# Patient Record
Sex: Male | Born: 1960
Health system: Southern US, Community
[De-identification: ages and names within clinical notes are randomized; demographics above are authoritative.]

## PROBLEM LIST (undated history)

## (undated) DIAGNOSIS — E119 Type 2 diabetes mellitus without complications: Secondary | ICD-10-CM

## (undated) DIAGNOSIS — G4730 Sleep apnea, unspecified: Secondary | ICD-10-CM

## (undated) DIAGNOSIS — I10 Essential (primary) hypertension: Secondary | ICD-10-CM

## (undated) DIAGNOSIS — Z9889 Other specified postprocedural states: Secondary | ICD-10-CM

## (undated) DIAGNOSIS — Z87442 Personal history of urinary calculi: Secondary | ICD-10-CM

## (undated) DIAGNOSIS — R112 Nausea with vomiting, unspecified: Secondary | ICD-10-CM

## (undated) HISTORY — PX: CARPAL TUNNEL RELEASE: SHX101

## (undated) HISTORY — PX: EYE SURGERY: SHX253

## (undated) HISTORY — PX: HERNIA REPAIR: SHX51

## (undated) HISTORY — DX: Type 2 diabetes mellitus without complications: E11.9

## (undated) HISTORY — PX: TRICEPS TENDON REPAIR: SHX2577

## (undated) HISTORY — PX: BACK SURGERY: SHX140

## (undated) HISTORY — DX: Essential (primary) hypertension: I10

## (undated) HISTORY — PX: OTHER SURGICAL HISTORY: SHX169

---

## 2011-06-09 HISTORY — PX: JOINT REPLACEMENT: SHX530

## 2016-08-24 LAB — POCT SYNOVIAL FLUID - CRYSTALS

## 2016-08-25 DIAGNOSIS — I152 Hypertension secondary to endocrine disorders: Secondary | ICD-10-CM | POA: Insufficient documentation

## 2016-08-25 DIAGNOSIS — E1159 Type 2 diabetes mellitus with other circulatory complications: Secondary | ICD-10-CM | POA: Insufficient documentation

## 2016-08-25 DIAGNOSIS — I10 Essential (primary) hypertension: Secondary | ICD-10-CM

## 2017-02-20 ENCOUNTER — Telehealth: Payer: Self-pay | Admitting: Family Medicine

## 2017-02-20 NOTE — Telephone Encounter (Signed)
Per pt, he received refill from previous DR

## 2017-02-25 ENCOUNTER — Ambulatory Visit: Payer: Self-pay | Admitting: Family Medicine

## 2017-02-26 ENCOUNTER — Ambulatory Visit: Payer: Self-pay | Admitting: Family Medicine

## 2017-02-28 ENCOUNTER — Ambulatory Visit: Payer: Self-pay | Admitting: Family Medicine

## 2017-03-05 ENCOUNTER — Ambulatory Visit: Payer: Self-pay | Admitting: Family Medicine

## 2017-03-28 ENCOUNTER — Ambulatory Visit (INDEPENDENT_AMBULATORY_CARE_PROVIDER_SITE_OTHER): Payer: Medicare Other | Admitting: Family Medicine

## 2017-03-28 ENCOUNTER — Encounter: Payer: Self-pay | Admitting: Family Medicine

## 2017-03-28 VITALS — BP 161/94 | HR 86 | Temp 97.3°F | Ht 66.0 in | Wt 229.4 lb

## 2017-03-28 DIAGNOSIS — M25512 Pain in left shoulder: Secondary | ICD-10-CM

## 2017-03-28 DIAGNOSIS — M546 Pain in thoracic spine: Secondary | ICD-10-CM

## 2017-03-28 DIAGNOSIS — E1169 Type 2 diabetes mellitus with other specified complication: Secondary | ICD-10-CM | POA: Insufficient documentation

## 2017-03-28 DIAGNOSIS — E119 Type 2 diabetes mellitus without complications: Secondary | ICD-10-CM

## 2017-03-28 DIAGNOSIS — G8929 Other chronic pain: Secondary | ICD-10-CM | POA: Diagnosis not present

## 2017-03-28 DIAGNOSIS — E669 Obesity, unspecified: Secondary | ICD-10-CM | POA: Insufficient documentation

## 2017-03-28 LAB — BAYER DCA HB A1C WAIVED: HB A1C (BAYER DCA - WAIVED): 5.9 % (ref <7.0)

## 2017-03-28 NOTE — Progress Notes (Signed)
   HPI  Patient presents today here to establish care with diabetes, back pain, left shoulder pain.  Diabetes Patient watching his diet moderately, he does have some indiscretions. Good medication compliance.  Back pain Patient states that he was previously addicted oxycodone, he does not want to be treated with any pain medications. He is currently flying to [place] to continue receiving back injections that helped him. He wonders if there is an orthopedic doctor or physical medicine and rehabilitation doctor that do back injections locally.  Left shoulder pain Started after lifting a lawnmower, described as left shoulder pain across his left Clavicle and radiating down his left bicep. Does not limit his activity.   PMH: Diabetes, hypertension Surgical history: Clavicle surgery, back surgery, bilateral eye surgery for glaucoma, hernia repair, triceps tendon repair Family history: Father with diabetes, heart disease, hypertension, mother with cancer Social history: Never smoker, no alcohol or drug use States that he was previously addicted to opiates and does not want to take these. ROS: Per HPI  Objective: BP (!) 161/94   Pulse 86   Temp (!) 97.3 F (36.3 C) (Oral)   Ht '5\' 6"'$  (1.676 m)   Wt 229 lb 6.4 oz (104.1 kg)   BMI 37.03 kg/m  Gen: NAD, alert, cooperative with exam HEENT: NCAT CV: RRR, good S1/S2, no murmur Resp: CTABL, no wheezes, non-labored Ext: No edema, warm Neuro: Alert and oriented, No gross deficits Left shoulder No tenderness to palpation of the bony landmarks of the shoulder Full range of motion, negative empty can and Hawkins sign  Assessment and plan:  # Type 2 diabetes A1c pending, continue metformin Appears to be well controlled  # Chronic thoracic back pain Patient receiving injections from physical medicine and rehabilitation in [place], refer to orthopedics for their opinion We do have PM&R but it is difficult to get appts with  and I believe he will be well taken care of woth ortho  # L shoulder pain. Unclear etiology, his provocative testing for rotator cuff syndrome is reassuring Mild to moderate symptoms Refer to orthopedics    Orders Placed This Encounter  Procedures  . CMP14+EGFR  . CBC with Differential/Platelet  . Lipid panel  . Bayer DCA Hb A1c Waived  . Ambulatory referral to Orthopedic Surgery    Referral Priority:   Routine    Referral Type:   Surgical    Referral Reason:   Specialty Services Required    Requested Specialty:   Orthopedic Surgery    Number of Visits Requested:   1    Meds ordered this encounter  Medications  . ibuprofen (ADVIL,MOTRIN) 800 MG tablet    Sig: Take one tablet with meals every 8 hours as needed for pain  . lisinopril (PRINIVIL,ZESTRIL) 10 MG tablet    Sig: Take 10 mg by mouth.  . metFORMIN (GLUCOPHAGE) 500 MG tablet    Sig: Take 2 tablets by mouth 2 (two) times daily.  . diclofenac sodium (VOLTAREN) 1 % GEL    Sig: Apply topically.  . naproxen sodium (ANAPROX) 550 MG tablet    Sig: Take 550 mg by mouth.  . Omega 3 1000 MG CAPS    Sig: Take 1 tablet by mouth daily.    Laroy Apple, MD [facility] 03/28/2017, 1:52 PM

## 2017-03-28 NOTE — Patient Instructions (Signed)
Great to meet you!  Come back in 3 month sunless you need [place] sooner.   We will make the calls for your orthopedics appointment

## 2017-03-29 LAB — CBC WITH DIFFERENTIAL/PLATELET
BASOS: 0 %
Basophils Absolute: 0 10*3/uL (ref 0.0–0.2)
EOS (ABSOLUTE): 0.2 10*3/uL (ref 0.0–0.4)
EOS: 3 %
HEMATOCRIT: 36.6 % — AB (ref 37.5–51.0)
HEMOGLOBIN: 12.6 g/dL — AB (ref 13.0–17.7)
IMMATURE GRANS (ABS): 0 10*3/uL (ref 0.0–0.1)
Immature Granulocytes: 0 %
LYMPHS ABS: 2.9 10*3/uL (ref 0.7–3.1)
LYMPHS: 32 %
MCH: 28.5 pg (ref 26.6–33.0)
MCHC: 34.4 g/dL (ref 31.5–35.7)
MCV: 83 fL (ref 79–97)
MONOCYTES: 8 %
Monocytes Absolute: 0.7 10*3/uL (ref 0.1–0.9)
NEUTROS ABS: 5 10*3/uL (ref 1.4–7.0)
Neutrophils: 57 %
Platelets: 230 10*3/uL (ref 150–379)
RBC: 4.42 x10E6/uL (ref 4.14–5.80)
RDW: 14.4 % (ref 12.3–15.4)
WBC: 8.8 10*3/uL (ref 3.4–10.8)

## 2017-03-29 LAB — CMP14+EGFR
ALK PHOS: 75 IU/L (ref 39–117)
ALT: 17 IU/L (ref 0–44)
AST: 17 IU/L (ref 0–40)
Albumin/Globulin Ratio: 2.1 (ref 1.2–2.2)
Albumin: 4.2 g/dL (ref 3.5–5.5)
BUN / CREAT RATIO: 16 (ref 9–20)
BUN: 14 mg/dL (ref 6–24)
Bilirubin Total: 0.3 mg/dL (ref 0.0–1.2)
CALCIUM: 9.6 mg/dL (ref 8.7–10.2)
CO2: 25 mmol/L (ref 20–29)
CREATININE: 0.87 mg/dL (ref 0.76–1.27)
Chloride: 105 mmol/L (ref 96–106)
GFR, EST AFRICAN AMERICAN: 112 mL/min/{1.73_m2} (ref >59)
GFR, EST NON AFRICAN AMERICAN: 96 mL/min/{1.73_m2} (ref >59)
GLOBULIN, TOTAL: 2 g/dL (ref 1.5–4.5)
GLUCOSE: 98 mg/dL (ref 65–99)
Potassium: 4.5 mmol/L (ref 3.5–5.2)
Sodium: 143 mmol/L (ref 134–144)
Total Protein: 6.2 g/dL (ref 6.0–8.5)

## 2017-03-29 LAB — LIPID PANEL
CHOL/HDL RATIO: 3.8 ratio (ref 0.0–5.0)
Cholesterol, Total: 166 mg/dL (ref 100–199)
HDL: 44 mg/dL (ref >39)
LDL Calculated: 94 mg/dL (ref 0–99)
Triglycerides: 140 mg/dL (ref 0–149)
VLDL CHOLESTEROL CAL: 28 mg/dL (ref 5–40)

## 2017-05-01 ENCOUNTER — Other Ambulatory Visit: Payer: Self-pay | Admitting: Family Medicine

## 2017-05-13 ENCOUNTER — Encounter: Payer: Self-pay | Admitting: Family Medicine

## 2017-05-13 ENCOUNTER — Ambulatory Visit (INDEPENDENT_AMBULATORY_CARE_PROVIDER_SITE_OTHER): Payer: Medicare Other | Admitting: Family Medicine

## 2017-05-13 VITALS — BP 164/91 | HR 75 | Temp 98.1°F | Ht 66.0 in | Wt 229.0 lb

## 2017-05-13 DIAGNOSIS — M5412 Radiculopathy, cervical region: Secondary | ICD-10-CM

## 2017-05-13 MED ORDER — BETAMETHASONE SOD PHOS & ACET 6 (3-3) MG/ML IJ SUSP
6.0000 mg | Freq: Once | INTRAMUSCULAR | Status: AC
  Administered 2017-05-13: 6 mg via INTRAMUSCULAR

## 2017-05-13 NOTE — Progress Notes (Signed)
Chief Complaint  Patient presents with  . Arm Pain    pt here today c/o right arm pain after getting flu shot last Tuesday and he was seen at Urgent Care Friday and was told it wasn't infected but gave him a Toradol shot and 800 mg Ibuprofen.    HPI  Patient presents today for right shoulder pain radiating from the deltoid of the superior margin of the trapezius and down toward the angle of the scapula on the right.  Onset was the day after he was given his flu shot.  He is concerned about infection because of multiple orthopedic procedures with hardware present.  He has had a staph infection in the past.  It was complicated by his hardware at the time.  PMH: Smoking status noted ROS: Per HPI  Objective: BP (!) 164/91   Pulse 75   Temp 98.1 F (36.7 C) (Oral)   Ht 5\' 6"  (1.676 m)   Wt 229 lb (103.9 kg)   BMI 36.96 kg/m  Gen: NAD, alert, cooperative with exam HEENT: NCAT, EOMI, PERRL CV: RRR, good S1/S2, no murmur Resp: CTABL, no wheezes, non-labored Ext: No edema, warm but not hot.  Full range of motion at the right upper extremity.  Mild tenderness over the belly of the deltoid muscle on the right.  No sign of erythema edema heat or purulence Neuro: Alert and oriented, No gross deficits  Assessment and plan:  1. Cervical neuralgia     Meds ordered this encounter  Medications  . betamethasone acetate-betamethasone sodium phosphate (CELESTONE) injection 6 mg   Sling dispensed.  He should wear when ambulatory for the next 5-7 days or until pain resolves.   Follow up as needed.  Mechele ClaudeWarren Cassadie Pankonin, MD

## 2017-05-17 ENCOUNTER — Telehealth: Payer: Self-pay | Admitting: Family Medicine

## 2017-05-17 ENCOUNTER — Other Ambulatory Visit: Payer: Self-pay | Admitting: Family Medicine

## 2017-05-17 MED ORDER — PREDNISONE 10 MG PO TABS: ORAL_TABLET | ORAL | 0 refills | Status: DC

## 2017-05-17 NOTE — Telephone Encounter (Signed)
Pt aware.

## 2017-05-17 NOTE — Telephone Encounter (Signed)
Pt would like steroid for shoulder pain - he seen stacks about this 11/5.

## 2017-05-17 NOTE — Telephone Encounter (Signed)
I sent in the requested prescription 

## 2017-06-21 ENCOUNTER — Telehealth: Payer: Self-pay | Admitting: Family Medicine

## 2017-06-21 NOTE — Telephone Encounter (Signed)
Attempted to contact patient - NVM 

## 2017-06-21 NOTE — Telephone Encounter (Signed)
Patient arm still hurting from the Flu Shot he got at [employer]. It did not bothering him while he was on Prednisone. He is [facility] [place]. Please advise.

## 2017-06-21 NOTE — Telephone Encounter (Signed)
Pt calling back and very up set Pt is waiting at pharmacy.

## 2017-06-27 ENCOUNTER — Telehealth: Payer: Self-pay | Admitting: Family Medicine

## 2017-06-27 ENCOUNTER — Ambulatory Visit (INDEPENDENT_AMBULATORY_CARE_PROVIDER_SITE_OTHER): Payer: Medicare Other | Admitting: Family Medicine

## 2017-06-27 ENCOUNTER — Encounter: Payer: Self-pay | Admitting: Family Medicine

## 2017-06-27 VITALS — BP 149/91 | HR 68 | Temp 97.0°F | Ht 66.0 in | Wt 227.8 lb

## 2017-06-27 DIAGNOSIS — R040 Epistaxis: Secondary | ICD-10-CM | POA: Diagnosis not present

## 2017-06-27 DIAGNOSIS — G629 Polyneuropathy, unspecified: Secondary | ICD-10-CM

## 2017-06-27 DIAGNOSIS — E119 Type 2 diabetes mellitus without complications: Secondary | ICD-10-CM

## 2017-06-27 LAB — CMP14+EGFR
ALBUMIN: 4.3 g/dL (ref 3.5–5.5)
ALT: 20 IU/L (ref 0–44)
AST: 17 IU/L (ref 0–40)
Albumin/Globulin Ratio: 2.4 — ABNORMAL HIGH (ref 1.2–2.2)
Alkaline Phosphatase: 70 IU/L (ref 39–117)
BUN / CREAT RATIO: 20 (ref 9–20)
BUN: 16 mg/dL (ref 6–24)
Bilirubin Total: 0.4 mg/dL (ref 0.0–1.2)
CALCIUM: 9.1 mg/dL (ref 8.7–10.2)
CO2: 23 mmol/L (ref 20–29)
CREATININE: 0.82 mg/dL (ref 0.76–1.27)
Chloride: 106 mmol/L (ref 96–106)
GFR, EST AFRICAN AMERICAN: 114 mL/min/{1.73_m2} (ref >59)
GFR, EST NON AFRICAN AMERICAN: 99 mL/min/{1.73_m2} (ref >59)
GLOBULIN, TOTAL: 1.8 g/dL (ref 1.5–4.5)
Glucose: 93 mg/dL (ref 65–99)
Potassium: 4.4 mmol/L (ref 3.5–5.2)
SODIUM: 143 mmol/L (ref 134–144)
TOTAL PROTEIN: 6.1 g/dL (ref 6.0–8.5)

## 2017-06-27 LAB — BAYER DCA HB A1C WAIVED: HB A1C (BAYER DCA - WAIVED): 5.9 % (ref <7.0)

## 2017-06-27 MED ORDER — METFORMIN HCL 500 MG PO TABS: ORAL_TABLET | ORAL | 1 refills | Status: DC

## 2017-06-27 MED ORDER — METHYLPREDNISOLONE ACETATE 80 MG/ML IJ SUSP
80.0000 mg | Freq: Once | INTRAMUSCULAR | Status: AC
  Administered 2017-06-27: 80 mg via INTRAMUSCULAR

## 2017-06-27 MED ORDER — PREGABALIN 75 MG PO CAPS: 75.0000 mg | ORAL_CAPSULE | Freq: Two times a day (BID) | ORAL | 2 refills | Status: DC

## 2017-06-27 MED ORDER — LISINOPRIL 10 MG PO TABS: 10.0000 mg | ORAL_TABLET | Freq: Every day | ORAL | 3 refills | Status: DC

## 2017-06-27 NOTE — Progress Notes (Signed)
   HPI  Patient presents today with right shoulder pain, nosebleeds, and diabetes.  Diabetes Watching high sugar foods, good metformin compliance  Right shoulder pain Started after a flu injection at [facility] that seemed to be too far posterior to the patient.  It is now hurting from the area of the injection to his right sided cervical area. No shoulder weakness or injury. No history of shoulder surgery on the right shoulder.  Nosebleeds Is been going on for 2 or 3 weeks, patient admits to using Afrin daily for 6 or more months.   PMH: Smoking status noted ROS: Per HPI  Objective: BP (!) 149/91   Pulse 68   Temp (!) 97 F (36.1 C) (Oral)   Ht '5\' 6"'$  (1.676 m)   Wt 227 lb 12.8 oz (103.3 kg)   BMI 36.77 kg/m  Gen: NAD, alert, cooperative with exam HEENT: NCAT, fresh blood in the left nares but no active bleeding, no visible vessel, swollen and boggy mucosa BL CV: RRR, good S1/S2, no murmur Resp: CTABL, no wheezes, non-labored Ext: No edema, warm Neuro: Alert and oriented, No gross deficits   Assessment and plan:  #Frequent nosebleeds Possibly Afrin induced, recommended stopping Afrin and less for severe nosebleed. Start nasal saline gel 2-3 times daily Transition to Flonase for chronic rhinitis  #Neuropathy Right shoulder pain most consistent with neuropathy, he does have a few signs of rotator cuff tendinitis on exam, however this started after a flu injection and has a very specific pattern of radiation concerning for neuropathy. Trial of Lyrica Refer to neurology He requests treatment today so he was given IM Depo-Medrol.  I did discuss the deleterious effects of this on his blood sugar control and cautioned against future use for this episode.  If it has not made a significant impact by this time it is unlikely to do so.  #Type 2 diabetes A1c pending, previously well controlled. Patient has had steroid exposure x2 since last visit Continue metformin Continue  lifestyle modification    Orders Placed This Encounter  Procedures  . Bayer DCA Hb A1c Waived  . CMP14+EGFR  . Ambulatory referral to Neurology    Referral Priority:   Routine    Referral Type:   Consultation    Referral Reason:   Specialty Services Required    Requested Specialty:   Neurology    Number of Visits Requested:   1    Meds ordered this encounter  Medications  . lisinopril (PRINIVIL,ZESTRIL) 10 MG tablet    Sig: Take 1 tablet (10 mg total) by mouth daily.    Dispense:  90 tablet    Refill:  3  . metFORMIN (GLUCOPHAGE) 500 MG tablet    Sig: TAKE 2 TABLETS 2 TIMES A DAY    Dispense:  180 tablet    Refill:  1  . pregabalin (LYRICA) 75 MG capsule    Sig: Take 1 capsule (75 mg total) by mouth 2 (two) times daily.    Dispense:  60 capsule    Refill:  [place], MD [place] 06/27/2017, 8:43 AM

## 2017-06-27 NOTE — Telephone Encounter (Signed)
Patient says saline gel causing his nose to bleed when using.  Please advise.

## 2017-06-27 NOTE — Telephone Encounter (Signed)
Patient aware and verbalizes understanding. 

## 2017-06-27 NOTE — Telephone Encounter (Signed)
Patient purchased saline gel to use in his nose and will use a qtip to apply.

## 2017-06-27 NOTE — Patient Instructions (Signed)
Great to see you  For your shoulder Try Lyrica 1 pill twice daily I have also referred you to neurology.  For nosebleeds Try to stop Afrin, after about 1 week you may try Flonase 2 sprays twice daily, this is over-the-counter Also I recommended trying nasal saline gel 2-3 times daily to help soothe the nasal mucosa as you heal.

## 2017-06-27 NOTE — Addendum Note (Signed)
Addended by: Angela AdamOSTOSKY, Arien Benincasa C on: 06/27/2017 09:12 AM   Modules accepted: Orders

## 2017-06-27 NOTE — Telephone Encounter (Signed)
Recommend using a finger to apply, continue using. He wasbleeding prior to use, likely it is only moving around what was there.   Murtis SinkSam Abdimalik Mayorquin, MD [facility] 06/27/2017, 5:05 PM

## 2017-06-28 ENCOUNTER — Encounter: Payer: Self-pay | Admitting: Family Medicine

## 2017-07-04 NOTE — Telephone Encounter (Signed)
Refer to phone note 06/27/17.

## 2017-08-19 DIAGNOSIS — F1920 Other psychoactive substance dependence, uncomplicated: Secondary | ICD-10-CM | POA: Insufficient documentation

## 2017-08-20 DIAGNOSIS — R10A1 Flank pain, right side: Secondary | ICD-10-CM | POA: Insufficient documentation

## 2017-08-20 DIAGNOSIS — R109 Unspecified abdominal pain: Secondary | ICD-10-CM | POA: Insufficient documentation

## 2017-09-02 ENCOUNTER — Telehealth: Payer: Self-pay | Admitting: *Deleted

## 2017-09-02 ENCOUNTER — Ambulatory Visit: Payer: Medicare Other | Admitting: Diagnostic Neuroimaging

## 2017-09-02 NOTE — Telephone Encounter (Signed)
Patient was no show for new patient appointment today. 

## 2017-09-03 ENCOUNTER — Encounter: Payer: Self-pay | Admitting: Diagnostic Neuroimaging

## 2017-09-18 DIAGNOSIS — N200 Calculus of kidney: Secondary | ICD-10-CM | POA: Insufficient documentation

## 2017-09-20 ENCOUNTER — Other Ambulatory Visit: Payer: Self-pay | Admitting: *Deleted

## 2017-09-20 MED ORDER — METFORMIN HCL 500 MG PO TABS: ORAL_TABLET | ORAL | 0 refills | Status: DC

## 2017-09-20 NOTE — Telephone Encounter (Signed)
Next Ov 09/25/17 

## 2017-09-25 ENCOUNTER — Telehealth: Payer: Self-pay | Admitting: Family Medicine

## 2017-09-25 ENCOUNTER — Ambulatory Visit: Payer: Medicare Other | Admitting: Family Medicine

## 2017-09-25 ENCOUNTER — Encounter: Payer: Self-pay | Admitting: Family Medicine

## 2017-09-25 VITALS — BP 159/95 | HR 63 | Temp 97.5°F | Ht 66.0 in | Wt 223.0 lb

## 2017-09-25 DIAGNOSIS — I83813 Varicose veins of bilateral lower extremities with pain: Secondary | ICD-10-CM

## 2017-09-25 DIAGNOSIS — E119 Type 2 diabetes mellitus without complications: Secondary | ICD-10-CM | POA: Diagnosis not present

## 2017-09-25 DIAGNOSIS — I10 Essential (primary) hypertension: Secondary | ICD-10-CM | POA: Diagnosis not present

## 2017-09-25 LAB — BAYER DCA HB A1C WAIVED: HB A1C: 5.6 % (ref <7.0)

## 2017-09-25 MED ORDER — LISINOPRIL 10 MG PO TABS: 10.0000 mg | ORAL_TABLET | Freq: Every day | ORAL | 3 refills | Status: DC

## 2017-09-25 NOTE — Progress Notes (Signed)
   HPI  Patient presents today to follow-up for diabetes and hypertension.  Hypertension Patient states he is taking 2 pills of what ever he has, his previous doctor told her to take 2 fives, I have prescribed 10 mg and he is not sure what he is actually taking. No headache or chest pain. Not checking at home.  Type 2 diabetes Good medication compliance with metformin Also watching diet.  Varicose veins Long-standing problem bilateral legs, patient has had some bleeding and pain with the superficial varicose veins of the right leg.   PMH: Smoking status noted ROS: Per HPI  Objective: BP (!) 159/95   Pulse 63   Temp (!) 97.5 F (36.4 C) (Oral)   Ht 5\' 6"  (1.676 m)   Wt 223 lb (101.2 kg)   BMI 35.99 kg/m  Gen: NAD, alert, cooperative with exam HEENT: NCAT CV: RRR, good S1/S2, no murmur Resp: CTABL, no wheezes, non-labored Abd: SNTND, BS present, no guarding or organomegaly Ext: No edema, dilated varicose veins bilateral legs right greater than left Neuro: Alert and oriented, No gross deficits  Assessment and plan:  #Type 2 diabetes Well controlled with A1c of 5.6 Continue metformin only Foot exam normal today  #Hypertension Uncontrolled Titrate lisinopril, we will do this after the patient calls back to clarify what dose he is taking  #Varicose veins Patient with varicose veins that are irritated and bleeding at times Referral to vascular  Orders Placed This Encounter  Procedures  . Microalbumin / creatinine urine ratio  . Bayer DCA Hb A1c Waived  . Ambulatory referral to Vascular Surgery    Referral Priority:   Routine    Referral Type:   Surgical    Referral Reason:   Specialty Services Required    Requested Specialty:   Vascular Surgery    Number of Visits Requested:   1     Murtis SinkSam Slaton Reaser, MD [facility] 09/25/2017, 8:44 AM

## 2017-09-25 NOTE — Telephone Encounter (Signed)
Patient aware and verbalizes understanding. 

## 2017-09-25 NOTE — Telephone Encounter (Signed)
Rolled HTN- recommend chnaging from 5 mg ( two 2.5 mg tabs) lisinopril to 10 mg.   Murtis SinkSam Bradshaw, MD [facility] 09/25/2017, 11:06 AM

## 2017-09-25 NOTE — Telephone Encounter (Signed)
Patient states he has been taking 2 5mg  of Lisinopril- Please advise if you are changing dose.

## 2017-09-25 NOTE — Patient Instructions (Signed)
Great to see you!  Keep up the god work, your diabetes is doing great!

## 2017-09-26 LAB — MICROALBUMIN / CREATININE URINE RATIO
CREATININE, UR: 67.7 mg/dL
MICROALB/CREAT RATIO: 5.8 mg/g{creat} (ref 0.0–30.0)
Microalbumin, Urine: 3.9 ug/mL

## 2017-10-08 ENCOUNTER — Other Ambulatory Visit: Payer: Self-pay | Admitting: *Deleted

## 2017-10-08 MED ORDER — METFORMIN HCL 500 MG PO TABS: ORAL_TABLET | ORAL | 0 refills | Status: DC

## 2017-11-25 ENCOUNTER — Ambulatory Visit: Payer: Medicare Other | Admitting: Family Medicine

## 2017-11-25 ENCOUNTER — Ambulatory Visit (INDEPENDENT_AMBULATORY_CARE_PROVIDER_SITE_OTHER): Payer: Medicare Other

## 2017-11-25 ENCOUNTER — Telehealth: Payer: Self-pay

## 2017-11-25 ENCOUNTER — Encounter: Payer: Self-pay | Admitting: Family Medicine

## 2017-11-25 VITALS — BP 130/80 | HR 71 | Temp 97.7°F | Ht 66.0 in | Wt 228.4 lb

## 2017-11-25 DIAGNOSIS — M5412 Radiculopathy, cervical region: Secondary | ICD-10-CM | POA: Diagnosis not present

## 2017-11-25 DIAGNOSIS — R52 Pain, unspecified: Secondary | ICD-10-CM | POA: Diagnosis not present

## 2017-11-25 DIAGNOSIS — T8089XD Other complications following infusion, transfusion and therapeutic injection, subsequent encounter: Secondary | ICD-10-CM

## 2017-11-25 MED ORDER — GABAPENTIN 600 MG PO TABS: 300.0000 mg | ORAL_TABLET | Freq: Three times a day (TID) | ORAL | 2 refills | Status: DC

## 2017-11-25 MED ORDER — NAPROXEN 500 MG PO TABS: 500.0000 mg | ORAL_TABLET | Freq: Two times a day (BID) | ORAL | 3 refills | Status: DC

## 2017-11-25 MED ORDER — DICLOFENAC SODIUM 1 % TD GEL: 2.0000 g | Freq: Four times a day (QID) | TRANSDERMAL | 3 refills | Status: DC

## 2017-11-25 NOTE — Progress Notes (Signed)
   HPI  Patient presents today with right shoulder pain.  Patient explains that the symptoms began in November after a flu shot.  Patient states that his flu shot seemed to be more posterior than usual and was given at a local grocery store.  Patient explains that he has had right posterior shoulder pain in the area that seems to be getting worse.  At times it is getting better but been getting worse. He does have some radiation down his arm with paresthesias and tingling.  He does have some worsening with movement of his neck. He denies any injury of his neck.   PMH: Smoking status noted ROS: Per HPI  Objective: BP 130/80   Pulse 71   Temp 97.7 F (36.5 C) (Oral)   Ht  (1.676 m)   Wt 228 lb 6.4 oz (103.6 kg)   BMI 36.86 kg/m  Gen: NAD, alert, cooperative with exam HEENT: NCAT CV: RRR, good S1/S2, no murmur Resp: CTABL, no wheezes, non-labored Ext: No edema, warm Neuro: Alert and oriented, No gross deficits  Assessment and plan:  #Pain at injection site, cervical radiculopathy Patient with unusual story, he began with pain after an injection. He still has tenderness over that area today.  However he does have radiculopathy and radiation down his arm causes some concern for cervical nerve root involvement. Plain film today Start gabapentin MRI If MR is clear I would recommend neurology evaluation to consider EMG to see if they can map which nerve is affected after the injection. I do not believe that the injection is absolutely to cause for his pain, however it could be, he could be developing something similar to complex regional pain syndrome.  He could also be that he has radiculopathy that is not related except as it pertains to time.  Orders Placed This Encounter  Procedures  . DG Cervical Spine 2 or 3 views    Standing Status:   Future    Standing Expiration Date:   11/25/2018    Order Specific Question:   Reason for Exam (SYMPTOM  OR DIAGNOSIS REQUIRED)   Answer:   R sided cervical radiculopathy    Order Specific Question:   Preferred imaging location?    Answer:   External  . MR Cervical Spine Wo Contrast    Standing Status:   Future    Standing Expiration Date:   01/26/2019    Order Specific Question:   What is the patient's sedation requirement?    Answer:   No Sedation    Order Specific Question:   Does the patient have a pacemaker or implanted devices?    Answer:   No    Order Specific Question:   Preferred imaging location?    Answer:   [facility] (table limit-550lbs)    Order Specific Question:   Radiology Contrast Protocol - do NOT remove file path    Answer:   [email or web address]    Meds ordered this encounter  Medications  . gabapentin (NEURONTIN) 600 MG tablet    Sig: Take 0.5 tablets (300 mg total) by mouth 3 (three) times daily.    Dispense:  45 tablet    Refill:  2    Murtis Sink, MD [place] 11/25/2017, 10:22 AM

## 2017-11-25 NOTE — Patient Instructions (Signed)
Great to see you!  Start gabapentin 1/2 pill at night, you may increase to 1/2 pill up to 3 times daily if it does not make you sleepy.  After 2 to 3 weeks if your symptoms are not improving, you may increase to 1 whole pill at night and slowly work your way up to 1 whole pill 3 times daily.  This medication may make you sleepy.

## 2017-11-25 NOTE — Telephone Encounter (Signed)
Returning your call. °

## 2017-11-25 NOTE — Addendum Note (Signed)
Addended by: Elenora Gamma on: 11/25/2017 10:43 AM   Modules accepted: Orders

## 2017-11-26 NOTE — Telephone Encounter (Signed)
Waiting on prior auth

## 2017-11-27 NOTE — Telephone Encounter (Signed)
In process 

## 2017-12-05 ENCOUNTER — Other Ambulatory Visit: Payer: Self-pay

## 2017-12-05 DIAGNOSIS — M5412 Radiculopathy, cervical region: Secondary | ICD-10-CM

## 2017-12-05 NOTE — Progress Notes (Signed)
Patient aware that insurance denied his MRI for neck pain.  Dr. Ermalinda Memos is requesting patient be referred to Ortho- patient aware and referral placed.

## 2017-12-09 ENCOUNTER — Other Ambulatory Visit: Payer: Self-pay | Admitting: Family Medicine

## 2017-12-09 MED ORDER — GABAPENTIN 600 MG PO TABS: ORAL_TABLET | ORAL | 0 refills | Status: DC

## 2017-12-09 NOTE — Addendum Note (Signed)
Addended by: Julious PayerHOLT, Kendalynn Wideman D on: 12/09/2017 12:42 PM   Modules accepted: Orders

## 2017-12-10 ENCOUNTER — Telehealth: Payer: Self-pay | Admitting: Family Medicine

## 2017-12-10 NOTE — Telephone Encounter (Signed)
Patient aware that CD for copy of xray is getting made and patient can come pick up in an hour.

## 2017-12-17 DIAGNOSIS — M75101 Unspecified rotator cuff tear or rupture of right shoulder, not specified as traumatic: Secondary | ICD-10-CM | POA: Insufficient documentation

## 2017-12-26 ENCOUNTER — Ambulatory Visit: Payer: Medicare Other | Admitting: Family Medicine

## 2017-12-30 ENCOUNTER — Ambulatory Visit: Payer: Medicare Other | Admitting: Family Medicine

## 2017-12-30 DIAGNOSIS — M25511 Pain in right shoulder: Secondary | ICD-10-CM | POA: Insufficient documentation

## 2017-12-31 ENCOUNTER — Ambulatory Visit (INDEPENDENT_AMBULATORY_CARE_PROVIDER_SITE_OTHER): Payer: Medicare Other | Admitting: Family Medicine

## 2017-12-31 ENCOUNTER — Encounter: Payer: Self-pay | Admitting: Family Medicine

## 2017-12-31 VITALS — BP 163/99 | HR 71 | Temp 97.5°F | Ht 66.0 in | Wt 222.6 lb

## 2017-12-31 DIAGNOSIS — I10 Essential (primary) hypertension: Secondary | ICD-10-CM

## 2017-12-31 DIAGNOSIS — E119 Type 2 diabetes mellitus without complications: Secondary | ICD-10-CM

## 2017-12-31 LAB — CMP14+EGFR
ALK PHOS: 73 IU/L (ref 39–117)
ALT: 25 IU/L (ref 0–44)
AST: 21 IU/L (ref 0–40)
Albumin/Globulin Ratio: 2.8 — ABNORMAL HIGH (ref 1.2–2.2)
Albumin: 4.8 g/dL (ref 3.5–5.5)
BUN/Creatinine Ratio: 24 — ABNORMAL HIGH (ref 9–20)
BUN: 18 mg/dL (ref 6–24)
Bilirubin Total: 0.4 mg/dL (ref 0.0–1.2)
CALCIUM: 9.8 mg/dL (ref 8.7–10.2)
CO2: 21 mmol/L (ref 20–29)
CREATININE: 0.74 mg/dL — AB (ref 0.76–1.27)
Chloride: 107 mmol/L — ABNORMAL HIGH (ref 96–106)
GFR calc Af Amer: 118 mL/min/{1.73_m2} (ref >59)
GFR, EST NON AFRICAN AMERICAN: 102 mL/min/{1.73_m2} (ref >59)
GLUCOSE: 90 mg/dL (ref 65–99)
Globulin, Total: 1.7 g/dL (ref 1.5–4.5)
Potassium: 4.4 mmol/L (ref 3.5–5.2)
Sodium: 143 mmol/L (ref 134–144)
Total Protein: 6.5 g/dL (ref 6.0–8.5)

## 2017-12-31 LAB — CBC WITH DIFFERENTIAL/PLATELET
BASOS ABS: 0 10*3/uL (ref 0.0–0.2)
BASOS: 0 %
EOS (ABSOLUTE): 0 10*3/uL (ref 0.0–0.4)
Eos: 0 %
Hematocrit: 39.9 % (ref 37.5–51.0)
Hemoglobin: 13.2 g/dL (ref 13.0–17.7)
IMMATURE GRANS (ABS): 0 10*3/uL (ref 0.0–0.1)
IMMATURE GRANULOCYTES: 0 %
LYMPHS: 15 %
Lymphocytes Absolute: 1.9 10*3/uL (ref 0.7–3.1)
MCH: 28.7 pg (ref 26.6–33.0)
MCHC: 33.1 g/dL (ref 31.5–35.7)
MCV: 87 fL (ref 79–97)
MONOS ABS: 0.6 10*3/uL (ref 0.1–0.9)
Monocytes: 5 %
NEUTROS PCT: 80 %
Neutrophils Absolute: 10.1 10*3/uL — ABNORMAL HIGH (ref 1.4–7.0)
PLATELETS: 224 10*3/uL (ref 150–450)
RBC: 4.6 x10E6/uL (ref 4.14–5.80)
RDW: 14.2 % (ref 12.3–15.4)
WBC: 12.7 10*3/uL — AB (ref 3.4–10.8)

## 2017-12-31 LAB — BAYER DCA HB A1C WAIVED: HB A1C: 5.9 % (ref <7.0)

## 2017-12-31 MED ORDER — LISINOPRIL 20 MG PO TABS: 20.0000 mg | ORAL_TABLET | Freq: Every day | ORAL | 3 refills | Status: DC

## 2017-12-31 MED ORDER — METFORMIN HCL 500 MG PO TABS
ORAL_TABLET | ORAL | 3 refills | Status: DC
Start: 2017-12-31 — End: 2018-02-19

## 2017-12-31 NOTE — Progress Notes (Signed)
   HPI  Patient presents today for follow-up chronic problems.  2 diabetes Watching diet minimally Good medication compliance and tolerance Not checking blood sugars.  Hypertension Good medication compliance, no headache or chest pain. Not checking blood pressure at home.  Shoulder pain-working with orthopedic surgery, also working with them for neck pain. Feels like he is getting good treatment, he is using ibuprofen mainly for pain. Sleep patient was on high-dose narcotics and wants to avoid any chance of getting addicted again.  PMH: Smoking status noted ROS: Per HPI  Objective: BP (!) 163/99   Pulse 71   Temp (!) 97.5 F (36.4 C) (Oral)   Ht '5\' 6"'$  (1.676 m)   Wt 222 lb 9.6 oz (101 kg)   BMI 35.93 kg/m  Gen: NAD, alert, cooperative with exam HEENT: NCAT CV: RRR, good S1/S2, no murmur Resp: CTABL, no wheezes, non-labored Ext: No edema, warm Neuro: Alert and oriented, No gross deficits  Assessment and plan:  #Type 2 diabetes Previously very well controlled, A1c pending Expect good control Continue metformin Discussed that steroid injection of the shoulder can affect his blood sugar  #Hypertension Uncontrolled, his last 4 visits have had one very well controlled blood pressure and the others were uncontrolled Titrate lisinopril to 20 mg    Orders Placed This Encounter  Procedures  . CMP14+EGFR  . CBC with Differential/Platelet  . Bayer DCA Hb A1c Waived    Meds ordered this encounter  Medications  . metFORMIN (GLUCOPHAGE) 500 MG tablet    Sig: TAKE 2 TABLETS 2 TIMES A DAY    Dispense:  360 tablet    Refill:  3  . lisinopril (PRINIVIL,ZESTRIL) 20 MG tablet    Sig: Take 1 tablet (20 mg total) by mouth daily.    Dispense:  90 tablet    Refill:  [place], MD [facility] 12/31/2017, 9:54 AM

## 2018-01-01 ENCOUNTER — Ambulatory Visit: Payer: Medicare Other

## 2018-01-02 ENCOUNTER — Encounter: Payer: Self-pay | Admitting: Family Medicine

## 2018-01-07 ENCOUNTER — Encounter: Payer: Self-pay | Admitting: *Deleted

## 2018-01-21 DIAGNOSIS — M19011 Primary osteoarthritis, right shoulder: Secondary | ICD-10-CM | POA: Insufficient documentation

## 2018-02-05 DIAGNOSIS — M25512 Pain in left shoulder: Secondary | ICD-10-CM | POA: Insufficient documentation

## 2018-02-19 ENCOUNTER — Other Ambulatory Visit: Payer: Self-pay | Admitting: Family Medicine

## 2018-02-19 MED ORDER — GABAPENTIN 600 MG PO TABS: ORAL_TABLET | ORAL | 0 refills | Status: DC

## 2018-02-19 MED ORDER — LISINOPRIL 20 MG PO TABS: 20.0000 mg | ORAL_TABLET | Freq: Every day | ORAL | 0 refills | Status: DC

## 2018-02-19 MED ORDER — NAPROXEN 500 MG PO TABS: 500.0000 mg | ORAL_TABLET | Freq: Two times a day (BID) | ORAL | 0 refills | Status: DC

## 2018-02-19 MED ORDER — METFORMIN HCL 500 MG PO TABS: ORAL_TABLET | ORAL | 0 refills | Status: DC

## 2018-02-19 NOTE — Addendum Note (Signed)
Addended by: Arville CareETTINGER, JOSHUA on: 02/19/2018 04:36 PM   Modules accepted: Orders

## 2018-02-19 NOTE — Telephone Encounter (Signed)
Previous Ermalinda MemosBradshaw patient transferring to [facility].  What is the name of the medication?  Metphormin 500 mg  - 2 tabs 2 x daily Lisinopril 20mg  - 1 tab x daily Gabapentin 600mg  1/2 tab (300mg ) by mouth 3 x a day Naprosyn 500mg  1 tab 2 x a day Lyrica  Have you contacted your pharmacy to request a refill? No, did not have bottles to call  Which pharmacy would you like this sent to?  [facility]   Patient notified that their request is being sent to the clinical staff for review and that they should receive a call once it is complete. If they do not receive a call within 24 hours they can check with their pharmacy or our office.

## 2018-02-19 NOTE — Telephone Encounter (Signed)
I have sent all the medications except Lyrica as gabapentin was on his active list and we do not usually give gabapentin and Lyrica to the same patient.  We will discuss this further in the visit.

## 2018-02-19 NOTE — Telephone Encounter (Signed)
Apt 9/26- please advise

## 2018-02-20 ENCOUNTER — Telehealth: Payer: Self-pay | Admitting: Family Medicine

## 2018-02-20 NOTE — Telephone Encounter (Signed)
No answer, voicemail not set up. 

## 2018-02-20 NOTE — Telephone Encounter (Signed)
Appointment scheduled tomorrow with Dr. Louanne Skyeettinger to discuss taking Lyrica instead of Gabapentin

## 2018-02-20 NOTE — Telephone Encounter (Signed)
Appointment scheduled 02/21/18 to discuss with Dr. Dettinger 

## 2018-02-20 NOTE — Telephone Encounter (Signed)
Appointment scheduled 02/21/18 to discuss with Dr. Louanne Skyeettinger

## 2018-02-21 ENCOUNTER — Ambulatory Visit: Payer: Medicare Other | Admitting: Family Medicine

## 2018-02-21 ENCOUNTER — Encounter: Payer: Self-pay | Admitting: Family Medicine

## 2018-02-21 VITALS — BP 140/87 | HR 81 | Temp 97.6°F | Ht 66.0 in | Wt 218.0 lb

## 2018-02-21 DIAGNOSIS — E1159 Type 2 diabetes mellitus with other circulatory complications: Secondary | ICD-10-CM | POA: Diagnosis not present

## 2018-02-21 DIAGNOSIS — I10 Essential (primary) hypertension: Secondary | ICD-10-CM | POA: Diagnosis not present

## 2018-02-21 DIAGNOSIS — I152 Hypertension secondary to endocrine disorders: Secondary | ICD-10-CM

## 2018-02-21 DIAGNOSIS — Z23 Encounter for immunization: Secondary | ICD-10-CM | POA: Diagnosis not present

## 2018-02-21 DIAGNOSIS — E1142 Type 2 diabetes mellitus with diabetic polyneuropathy: Secondary | ICD-10-CM

## 2018-02-21 MED ORDER — LISINOPRIL 20 MG PO TABS: 20.0000 mg | ORAL_TABLET | Freq: Every day | ORAL | 3 refills | Status: DC

## 2018-02-21 MED ORDER — METFORMIN HCL 500 MG PO TABS: ORAL_TABLET | ORAL | 3 refills | Status: DC

## 2018-02-21 MED ORDER — PREGABALIN 75 MG PO CAPS: 75.0000 mg | ORAL_CAPSULE | Freq: Two times a day (BID) | ORAL | 5 refills | Status: DC

## 2018-02-21 NOTE — Addendum Note (Signed)
Addended by: Lorelee CoverOSTOSKY, Draeden Kellman C on: 02/21/2018 10:00 AM   Modules accepted: Orders

## 2018-02-21 NOTE — Progress Notes (Signed)
BP 140/87   Pulse 81   Temp 97.6 F (36.4 C) (Oral)   Ht '5\' 6"'  (1.676 m)   Wt 218 lb (98.9 kg)   BMI 35.19 kg/m    Subjective:    Patient ID: Alex [name], male    DOB: [birth year], 57 y.o.   MRN: [id number]  HPI: Alex [name] is a 57 y.o. male presenting on 02/21/2018 for Establish Care (2 month check up on chronic medical conditions)   HPI Type 2 diabetes mellitus Patient comes in today for recheck of his diabetes. Patient has been currently taking metformin. Patient is currently on an ACE inhibitor/ARB. Patient has not seen an ophthalmologist this year. Patient denies any issues with their feet.  Patient does have some nerve pain in his right leg because of low back issues and also has some pain shooting from his neck to his right arm as well from this.  Patient is obese and discussed possibilities of changes, discussed with his pain from his back and his neck that a pool would be the best place for him to exercise.  Hypertension Patient is currently on lisinopril, and their blood pressure today is 140/87. Patient denies any lightheadedness or dizziness. Patient denies headaches, blurred vision, chest pains, shortness of breath, or weakness. Denies any side effects from medication and is content with current medication.   Relevant past medical, surgical, family and social history reviewed and updated as indicated. Interim medical history since our last visit reviewed. Allergies and medications reviewed and updated.  Review of Systems  Constitutional: Negative for chills and fever.  Eyes: Negative for visual disturbance.  Respiratory: Negative for shortness of breath and wheezing.   Cardiovascular: Negative for chest pain and leg swelling.  Musculoskeletal: Negative for back pain and gait problem.  Skin: Negative for rash.  Neurological: Negative for dizziness, weakness and light-headedness.  All other systems reviewed and are negative.   Per HPI unless specifically  indicated above   Allergies as of 02/21/2018      Reactions   Buprenorphine Hcl Nausea Only   Buprenorphine Hcl-naloxone Hcl Nausea Only   Codeine Nausea Only   Per pt Per pt, "felt weird in my head"      Medication List        Accurate as of 02/21/18  9:31 AM. Always use your most recent med list.          diclofenac sodium 1 % Gel Commonly known as:  VOLTAREN Apply 2 g topically 4 (four) times daily.   lisinopril 20 MG tablet Commonly known as:  PRINIVIL,ZESTRIL Take 1 tablet (20 mg total) by mouth daily.   metFORMIN 500 MG tablet Commonly known as:  GLUCOPHAGE TAKE 2 TABLETS 2 TIMES A DAY   naproxen 500 MG tablet Commonly known as:  NAPROSYN Take 1 tablet (500 mg total) by mouth 2 (two) times daily with a meal.   Omega 3 1000 MG Caps Take 1 tablet by mouth daily.   pregabalin 75 MG capsule Commonly known as:  LYRICA Take 75 mg by mouth 2 (two) times daily.          Objective:    BP 140/87   Pulse 81   Temp 97.6 F (36.4 C) (Oral)   Ht '5\' 6"'  (1.676 m)   Wt 218 lb (98.9 kg)   BMI 35.19 kg/m   Wt Readings from Last 3 Encounters:  02/21/18 218 lb (98.9 kg)  12/31/17 222 lb 9.6 oz (101 kg)  11/25/17 228  lb 6.4 oz (103.6 kg)    Physical Exam  Constitutional: He is oriented to person, place, and time. He appears well-developed and well-nourished. No distress.  Eyes: Conjunctivae are normal. No scleral icterus.  Neck: Neck supple. No thyromegaly present.  Cardiovascular: Normal rate, regular rhythm, normal heart sounds and intact distal pulses.  No murmur heard. Pulmonary/Chest: Effort normal and breath sounds normal. No respiratory distress. He has no wheezes.  Musculoskeletal: Normal range of motion. He exhibits no edema.  Lymphadenopathy:    He has no cervical adenopathy.  Neurological: He is alert and oriented to person, place, and time. Coordination normal.  Skin: Skin is warm and dry. No rash noted. He is not diaphoretic.  Psychiatric: He has a  normal mood and affect. His behavior is normal.  Nursing note and vitals reviewed.   Results for orders placed or performed in visit on 12/31/17  CMP14+EGFR  Result Value Ref Range   Glucose 90 65 - 99 mg/dL   BUN 18 6 - 24 mg/dL   Creatinine, Ser 0.74 (L) 0.76 - 1.27 mg/dL   GFR calc non Af Amer 102 >59 mL/min/1.73   GFR calc Af Amer 118 >59 mL/min/1.73   BUN/Creatinine Ratio 24 (H) 9 - 20   Sodium 143 134 - 144 mmol/L   Potassium 4.4 3.5 - 5.2 mmol/L   Chloride 107 (H) 96 - 106 mmol/L   CO2 21 20 - 29 mmol/L   Calcium 9.8 8.7 - 10.2 mg/dL   Total Protein 6.5 6.0 - 8.5 g/dL   Albumin 4.8 3.5 - 5.5 g/dL   Globulin, Total 1.7 1.5 - 4.5 g/dL   Albumin/Globulin Ratio 2.8 (H) 1.2 - 2.2   Bilirubin Total 0.4 0.0 - 1.2 mg/dL   Alkaline Phosphatase 73 39 - 117 IU/L   AST 21 0 - 40 IU/L   ALT 25 0 - 44 IU/L  CBC with Differential/Platelet  Result Value Ref Range   WBC 12.7 (H) 3.4 - 10.8 x10E3/uL   RBC 4.60 4.14 - 5.80 x10E6/uL   Hemoglobin 13.2 13.0 - 17.7 g/dL   Hematocrit 39.9 37.5 - 51.0 %   MCV 87 79 - 97 fL   MCH 28.7 26.6 - 33.0 pg   MCHC 33.1 31.5 - 35.7 g/dL   RDW 14.2 12.3 - 15.4 %   Platelets 224 150 - 450 x10E3/uL   Neutrophils 80 Not Estab. %   Lymphs 15 Not Estab. %   Monocytes 5 Not Estab. %   Eos 0 Not Estab. %   Basos 0 Not Estab. %   Neutrophils Absolute 10.1 (H) 1.4 - 7.0 x10E3/uL   Lymphocytes Absolute 1.9 0.7 - 3.1 x10E3/uL   Monocytes Absolute 0.6 0.1 - 0.9 x10E3/uL   EOS (ABSOLUTE) 0.0 0.0 - 0.4 x10E3/uL   Basophils Absolute 0.0 0.0 - 0.2 x10E3/uL   Immature Granulocytes 0 Not Estab. %   Immature Grans (Abs) 0.0 0.0 - 0.1 x10E3/uL  Bayer DCA Hb A1c Waived  Result Value Ref Range   HB A1C (BAYER DCA - WAIVED) 5.9 <7.0 %      Assessment & Plan:   Problem List Items Addressed This Visit      Cardiovascular and Mediastinum   Hypertension associated with diabetes ([place])   Relevant Medications   lisinopril (PRINIVIL,ZESTRIL) 20 MG tablet    metFORMIN (GLUCOPHAGE) 500 MG tablet     Endocrine   T2DM (type 2 diabetes mellitus) (HCC) - Primary   Relevant Medications   lisinopril (PRINIVIL,ZESTRIL)  20 MG tablet   metFORMIN (GLUCOPHAGE) 500 MG tablet   pregabalin (LYRICA) 75 MG capsule     Other   Morbid obesity ([place])   Relevant Medications   metFORMIN (GLUCOPHAGE) 500 MG tablet       Follow up plan: Return in about 3 months (around 05/24/2018), or if symptoms worsen or fail to improve, for Recheck diabetes and hypertension.  Counseling provided for all of the vaccine components No orders of the defined types were placed in this encounter.   Caryl Pina, MD [facility] 02/21/2018, 9:31 AM

## 2018-03-08 ENCOUNTER — Encounter (HOSPITAL_COMMUNITY): Payer: Self-pay

## 2018-03-08 ENCOUNTER — Emergency Department (HOSPITAL_COMMUNITY)
Admission: EM | Admit: 2018-03-08 | Discharge: 2018-03-08 | Disposition: A | Discharge status: Home / Self Care | Payer: Medicare Other | Attending: Emergency Medicine | Admitting: Emergency Medicine

## 2018-03-08 ENCOUNTER — Other Ambulatory Visit: Payer: Self-pay

## 2018-03-08 DIAGNOSIS — Z7984 Long term (current) use of oral hypoglycemic drugs: Secondary | ICD-10-CM | POA: Insufficient documentation

## 2018-03-08 DIAGNOSIS — M542 Cervicalgia: Secondary | ICD-10-CM | POA: Insufficient documentation

## 2018-03-08 DIAGNOSIS — R200 Anesthesia of skin: Secondary | ICD-10-CM | POA: Diagnosis not present

## 2018-03-08 DIAGNOSIS — I10 Essential (primary) hypertension: Secondary | ICD-10-CM | POA: Diagnosis not present

## 2018-03-08 DIAGNOSIS — Z79899 Other long term (current) drug therapy: Secondary | ICD-10-CM | POA: Insufficient documentation

## 2018-03-08 DIAGNOSIS — R202 Paresthesia of skin: Secondary | ICD-10-CM | POA: Diagnosis not present

## 2018-03-08 DIAGNOSIS — E119 Type 2 diabetes mellitus without complications: Secondary | ICD-10-CM | POA: Diagnosis not present

## 2018-03-08 DIAGNOSIS — F17220 Nicotine dependence, chewing tobacco, uncomplicated: Secondary | ICD-10-CM | POA: Insufficient documentation

## 2018-03-08 MED ORDER — PREDNISONE 20 MG PO TABS: ORAL_TABLET | ORAL | 0 refills | Status: DC

## 2018-03-08 MED ORDER — CYCLOBENZAPRINE HCL 10 MG PO TABS: 10.0000 mg | ORAL_TABLET | Freq: Three times a day (TID) | ORAL | 0 refills | Status: DC | PRN

## 2018-03-08 MED ORDER — DIAZEPAM 5 MG PO TABS
5.0000 mg | ORAL_TABLET | Freq: Once | ORAL | Status: AC
  Administered 2018-03-08: 5 mg via ORAL
  Filled 2018-03-08: qty 1

## 2018-03-08 MED ORDER — DEXAMETHASONE SODIUM PHOSPHATE 10 MG/ML IJ SOLN
10.0000 mg | Freq: Once | INTRAMUSCULAR | Status: AC
  Administered 2018-03-08: 10 mg via INTRAVENOUS
  Filled 2018-03-08: qty 1

## 2018-03-08 MED ORDER — KETOROLAC TROMETHAMINE 30 MG/ML IJ SOLN
15.0000 mg | Freq: Once | INTRAMUSCULAR | Status: AC
  Administered 2018-03-08: 15 mg via INTRAVENOUS
  Filled 2018-03-08: qty 1
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
## 2018-03-08 NOTE — ED Triage Notes (Signed)
Pt arrives from home via [facility] c/o severe neck pain. Pt was given 50mcg Fentanyll via [facility] enroute to ED. Pt states he has bulging discs C3-C5 and has surgery scheduled Apr 23, 2018 @110  @ [facility]. Pt states pain has been increasingly worse tonight and states Dilaudid is the only thing that helps him with pain.
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
## 2018-03-08 NOTE — Discharge Instructions (Signed)
Use ice and heat for comfort. Take the medications as prescribed. Recheck with your doctors if you need something else for pain.

## 2018-03-11 ENCOUNTER — Ambulatory Visit (INDEPENDENT_AMBULATORY_CARE_PROVIDER_SITE_OTHER): Payer: Medicare Other

## 2018-03-11 DIAGNOSIS — Z01818 Encounter for other preprocedural examination: Secondary | ICD-10-CM | POA: Diagnosis not present

## 2018-03-11 NOTE — Progress Notes (Signed)
EKG done for patient's surgical clearance for cervical repair in October with Dr. Shon Baton.  EKG shown to Dr. Darlyn Read who okayed him to leave.  EKG will be given to Dr. Louanne Skye when he is back in office tomorrow to review for surgical clearance.

## 2018-03-24 NOTE — Pre-Procedure Instructions (Signed)
Alex [name]  03/24/2018      [id number] - [place], [place] - [address] [place] [address] Phone: [phone] Fax: [phone]    Your procedure is scheduled on March 27, 2018.  [facility] Admitting at 1030 AM.  Call this number if you have problems the morning of surgery:  [phone]   Remember:  Do not eat or drink after midnight.    Take these medicines the morning of surgery with A SIP OF WATER  Cyclobenzaprine (flexeril) Hydrocodone-acetaminophen (norco)-if needed pregabalin (lyrica) Afrin nasal spray-if needed   7 days prior to surgery STOP taking any diclofenac (voltaren) gel, Aspirin (unless otherwise instructed by your surgeon), Aleve, Naproxen, Ibuprofen, Motrin, Advil, Goody's, BC's, all herbal medications, fish oil, and all vitamins  WHAT DO I DO ABOUT MY DIABETES MEDICATION?  Marland Kitchen. Do not take oral diabetes medicines (pills) the morning of surgery-metformin (glucophage)  Reviewed and Endorsed by [facility] Patient Education Committee, August 2015   How to Manage Your Diabetes Before and After Surgery  Why is it important to control my blood sugar before and after surgery? . Improving blood sugar levels before and after surgery helps healing and can limit problems. . A way of improving blood sugar control is eating a healthy diet by: o  Eating less sugar and carbohydrates o  Increasing activity/exercise o  Talking with your doctor about reaching your blood sugar goals . High blood sugars (greater than 180 mg/dL) can raise your risk of infections and slow your recovery, so you will need to focus on controlling your diabetes during the weeks before surgery. . Make sure that the doctor who takes care of your diabetes knows about your planned surgery including the date and location.  How do I manage my blood sugar before surgery? . Check your blood sugar at least 4 times a day, starting 2  days before surgery, to make sure that the level is not too high or low. o Check your blood sugar the morning of your surgery when you wake up and every 2 hours until you get to the Short Stay unit. . If your blood sugar is less than 70 mg/dL, you will need to treat for low blood sugar: o Do not take insulin. o Treat a low blood sugar (less than 70 mg/dL) with  cup of clear juice (cranberry or apple), 4 glucose tablets, OR glucose gel. Recheck blood sugar in 15 minutes after treatment (to make sure it is greater than 70 mg/dL). If your blood sugar is not greater than 70 mg/dL on recheck, call [phone] o  for further instructions. . Report your blood sugar to the short stay nurse when you get to Short Stay.  . If you are admitted to the hospital after surgery: o Your blood sugar will be checked by the staff and you will probably be given insulin after surgery (instead of oral diabetes medicines) to make sure you have good blood sugar levels. o The goal for blood sugar control after surgery is 80-180 mg/dL.    Contacts, dentures or bridgework may not be worn into surgery.  Leave your suitcase in the car.  After surgery it may be brought to your room.  For patients admitted to the hospital, discharge time will be determined by your treatment team.  Patients discharged the day of surgery will not be allowed to drive home.    [facility]- Preparing For Surgery  Before  surgery, you can play an important role. Because skin is not sterile, your skin needs to be as free of germs as possible. You can reduce the number of germs on your skin by washing with CHG (chlorahexidine gluconate) Soap before surgery.  CHG is an antiseptic cleaner which kills germs and bonds with the skin to continue killing germs even after washing.    Oral Hygiene is also important to reduce your risk of infection.  Remember - BRUSH YOUR TEETH THE MORNING OF SURGERY WITH YOUR REGULAR TOOTHPASTE  Please do not use if you  have an allergy to CHG or antibacterial soaps. If your skin becomes reddened/irritated stop using the CHG.  Do not shave (including legs and underarms) for at least 48 hours prior to first CHG shower. It is OK to shave your face.  Please follow these instructions carefully.   1. Shower the NIGHT BEFORE SURGERY and the MORNING OF SURGERY with CHG.   2. If you chose to wash your hair, wash your hair first as usual with your normal shampoo.  3. After you shampoo, rinse your hair and body thoroughly to remove the shampoo.  4. Use CHG as you would any other liquid soap. You can apply CHG directly to the skin and wash gently with a scrungie or a clean washcloth.   5. Apply the CHG Soap to your body ONLY FROM THE NECK DOWN.  Do not use on open wounds or open sores. Avoid contact with your eyes, ears, mouth and genitals (private parts). Wash Face and genitals (private parts)  with your normal soap.  6. Wash thoroughly, paying special attention to the area where your surgery will be performed.  7. Thoroughly rinse your body with warm water from the neck down.  8. DO NOT shower/wash with your normal soap after using and rinsing off the CHG Soap.  9. Pat yourself dry with a CLEAN TOWEL.  10. Wear CLEAN PAJAMAS to bed the night before surgery, wear comfortable clothes the morning of surgery  11. Place CLEAN SHEETS on your bed the night of your first shower and DO NOT SLEEP WITH PETS.  Day of Surgery:  Do not apply any deodorants/lotions.  Please wear clean clothes to the hospital/surgery center.   Remember to brush your teeth WITH YOUR REGULAR TOOTHPASTE.   Do not wear jewelry  Do not wear lotions, powders, or colognes, or deodorant.  Men may shave face and neck.  Do not bring valuables to the hospital.   [facility] is not responsible for any belongings or valuables.  Please read over the fact sheets that you were given.

## 2018-03-24 NOTE — H&P (Addendum)
Patient ID: Alex [name] MRN: [id number] DOB/AGE: [birth year] 57 y.o.  Admit date: (Not on file)  Admission Diagnoses:  Cervical DDD and stenosis  HPI: Very pleasant 57 year old male patient presents to clinic for his history and physical prior to getting an ACDF from C3-C5.  Pt is DM type 2 and is on Metformin and is well controlled.   Past Medical History: Past Medical History:  Diagnosis Date  . Diabetes mellitus without complication (HCC)   . Hypertension     Surgical History: Past Surgical History:  Procedure Laterality Date  . BACK SURGERY    . clavical surgery    . EYE SURGERY Bilateral    glaucoma  . HERNIA REPAIR    . TRICEPS TENDON REPAIR      Family History: Family History  Problem Relation Age of Onset  . Cancer Mother   . Diabetes Father   . Hypertension Father   . Heart attack Father     Social History: Social History   Socioeconomic History  . Marital status: Married    Spouse name: Not on file  . Number of children: Not on file  . Years of education: Not on file  . Highest education level: Not on file  Occupational History  . Not on file  Social Needs  . Financial resource strain: Not on file  . Food insecurity:    Worry: Not on file    Inability: Not on file  . Transportation needs:    Medical: Not on file    Non-medical: Not on file  Tobacco Use  . Smoking status: Never Smoker  . Smokeless tobacco: Current User    Types: Chew  Substance and Sexual Activity  . Alcohol use: No  . Drug use: No  . Sexual activity: Not on file  Lifestyle  . Physical activity:    Days per week: Not on file    Minutes per session: Not on file  . Stress: Not on file  Relationships  . Social connections:    Talks on phone: Not on file    Gets together: Not on file    Attends religious service: Not on file    Active member of club or organization: Not on file    Attends meetings of clubs or organizations: Not on file    Relationship status:  Not on file  . Intimate partner violence:    Fear of current or ex partner: Not on file    Emotionally abused: Not on file    Physically abused: Not on file    Forced sexual activity: Not on file  Other Topics Concern  . Not on file  Social History Narrative  . Not on file    Allergies: Buprenorphine hcl-naloxone hcl and Codeine  Medications: I have reviewed the patient's current medications.  Vital Signs: No data found.  Radiology: No results found.  Labs: No results for input(s): WBC, RBC, HCT, PLT in the last 72 hours. No results for input(s): NA, K, CL, CO2, BUN, CREATININE, GLUCOSE, CALCIUM in the last 72 hours. No results for input(s): LABPT, INR in the last 72 hours.  Review of Systems: ROS  Physical Exam: There is no height or weight on file to calculate BMI.  Physical Exam  Constitutional: He is oriented to person, place, and time. He appears well-developed and well-nourished.  Cardiovascular: Normal rate and regular rhythm.  Respiratory: Effort normal and breath sounds normal.  GI: Soft. Bowel sounds are normal.  Neurological: He is  alert and oriented to person, place, and time.  Skin: Skin is warm and dry.  Psychiatric: He has a normal mood and affect. His behavior is normal. Judgment and thought content normal.   Neck: Significant neck pain radiating predominantly into the right scapula and right lateral deltoid.  He has severe pain with range of motion and palpation.  He denies occipital headaches.  No significant pain on the left side. Neuro: 5 out of 5 strength in the upper extremity.  Sensation is diminished in the C4 dermatome.  And there is significant radicular pain in the right C4 and C5 dermatome.  Negative Babinski test, negative Hoffman test, 1+ deep tendon reflexes bilaterally in the upper extremity. Gait: Normal Musculoskeletal: No significant pain with range of motion of the shoulder, elbow, wrist except for the right side.  He has significant  scapular and shoulder pain with range of motion.  All referred into the trapezius towards the right lateral aspect of the spine.  Imaging: Cervical MRI was reviewed again from 01/29/18.  There is no cord signal changes.  He does have multilevel degenerative disease with severe facet arthrosis and prominent reactive edema about the right C4 facet joint.  He has got moderate foraminal stenosis on the right side at C3-4 causing contact and irritation to the C4 nerve root.  Severe right foraminal stenosis at C4-5 with irritation to the exiting C5 nerve root.  Patient had a recent C4-C5 selective nerve root block done on 02/25/18.  Patient reports near complete relief of his neck and lateral deltoid pain for about 2 days after the injection.  He states that his quality of life for the first time had significantly improved following that injection.    Assessment and Plan: Risks and benefits of surgery were discussed with the patient. These include: Infection, bleeding, death, stroke, paralysis, ongoing or worse pain, need for additional surgery, nonunion, leak of spinal fluid, adjacent segment degeneration requiring additional fusion surgery. Pseudoarthrosis (nonunion)requiring supplemental posterior fixation. Throat pain, swallowing difficulties, hoarseness or change in voice.  With respect to disc replacement: Additional risks include heterotopic ossification, inability to place the disc due to technical issues requiring bailout to a fusion procedure.  We have the pt pre-op clearance  Alex [name], PAC for Alex Lickahari Ching Rabideau, MD [facility] [phone]  Patient's clinical exam is essentially unchanged from his last office note of 03/24/2018.  He continues to have significant neck and radicular C5 arm pain on the right side.  Patient's pain is severe and debilitating and is significantly impacting his overall quality of life.  Given the failure of injection therapy, physical therapy, and medications  he is elected to move forward with a 2 level ACDF.  All appropriate risks benefits and alternatives to surgery were discussed with the patient and his family and their questions were encouraged.  Plan on moving forward with a 2 level ACDF.

## 2018-03-24 NOTE — Progress Notes (Addendum)
Admitting spoke with patient and he forgot his PAT appointment.  Scheduler notified and will try to get him scheduled for tomorrow if able.

## 2018-03-25 ENCOUNTER — Inpatient Hospital Stay (HOSPITAL_COMMUNITY)
Admission: RE | Admit: 2018-03-25 | Discharge: 2018-03-25 | Disposition: A | Discharge status: Home / Self Care | Payer: Medicare Other | Source: Ambulatory Visit

## 2018-03-26 ENCOUNTER — Other Ambulatory Visit: Payer: Self-pay

## 2018-03-26 ENCOUNTER — Encounter (HOSPITAL_COMMUNITY): Payer: Self-pay

## 2018-03-26 ENCOUNTER — Inpatient Hospital Stay (HOSPITAL_COMMUNITY)
Admission: RE | Admit: 2018-03-26 | Discharge: 2018-03-26 | Disposition: A | Discharge status: Home / Self Care | Payer: Medicare Other | Source: Ambulatory Visit

## 2018-03-26 HISTORY — DX: Sleep apnea, unspecified: G47.30

## 2018-03-26 HISTORY — DX: Personal history of urinary calculi: Z87.442

## 2018-03-26 HISTORY — DX: Other specified postprocedural states: Z98.890

## 2018-03-26 HISTORY — DX: Nausea with vomiting, unspecified: R11.2

## 2018-03-26 NOTE — Progress Notes (Addendum)
Pt. Reports last sleep study 15 yrs. Ago,doesn't use Cpap.  Doesn't check blood sugars.  Pt. Didn't show up for pre-admission appointment.  PCP: [facility] Ivin [facility]clearance in epic under note section.

## 2018-03-27 ENCOUNTER — Ambulatory Visit (HOSPITAL_COMMUNITY): Payer: Medicare Other | Admitting: Physician Assistant

## 2018-03-27 ENCOUNTER — Observation Stay (HOSPITAL_COMMUNITY)
Admission: RE | Admit: 2018-03-27 | Discharge: 2018-03-28 | Disposition: A | Discharge status: Home / Self Care | Payer: Medicare Other | Source: Ambulatory Visit | Attending: Orthopedic Surgery | Admitting: Orthopedic Surgery

## 2018-03-27 ENCOUNTER — Ambulatory Visit (HOSPITAL_COMMUNITY): Payer: Medicare Other | Admitting: Anesthesiology

## 2018-03-27 ENCOUNTER — Encounter (HOSPITAL_COMMUNITY): Payer: Self-pay | Admitting: Urology

## 2018-03-27 ENCOUNTER — Ambulatory Visit (HOSPITAL_COMMUNITY)
Admission: RE | Disposition: A | Discharge status: Home / Self Care | Payer: Self-pay | Source: Ambulatory Visit | Attending: Orthopedic Surgery

## 2018-03-27 ENCOUNTER — Ambulatory Visit (HOSPITAL_COMMUNITY): Payer: Medicare Other

## 2018-03-27 DIAGNOSIS — E119 Type 2 diabetes mellitus without complications: Secondary | ICD-10-CM | POA: Diagnosis not present

## 2018-03-27 DIAGNOSIS — G4730 Sleep apnea, unspecified: Secondary | ICD-10-CM | POA: Diagnosis not present

## 2018-03-27 DIAGNOSIS — M50121 Cervical disc disorder at C4-C5 level with radiculopathy: Secondary | ICD-10-CM | POA: Diagnosis not present

## 2018-03-27 DIAGNOSIS — Z791 Long term (current) use of non-steroidal anti-inflammatories (NSAID): Secondary | ICD-10-CM | POA: Diagnosis not present

## 2018-03-27 DIAGNOSIS — M4802 Spinal stenosis, cervical region: Secondary | ICD-10-CM | POA: Diagnosis present

## 2018-03-27 DIAGNOSIS — Z981 Arthrodesis status: Secondary | ICD-10-CM

## 2018-03-27 DIAGNOSIS — Z7984 Long term (current) use of oral hypoglycemic drugs: Secondary | ICD-10-CM | POA: Insufficient documentation

## 2018-03-27 DIAGNOSIS — E669 Obesity, unspecified: Secondary | ICD-10-CM | POA: Diagnosis not present

## 2018-03-27 DIAGNOSIS — F17220 Nicotine dependence, chewing tobacco, uncomplicated: Secondary | ICD-10-CM | POA: Insufficient documentation

## 2018-03-27 DIAGNOSIS — Z8249 Family history of ischemic heart disease and other diseases of the circulatory system: Secondary | ICD-10-CM | POA: Insufficient documentation

## 2018-03-27 DIAGNOSIS — I160 Hypertensive urgency: Secondary | ICD-10-CM | POA: Diagnosis present

## 2018-03-27 DIAGNOSIS — Z419 Encounter for procedure for purposes other than remedying health state, unspecified: Secondary | ICD-10-CM

## 2018-03-27 DIAGNOSIS — Z885 Allergy status to narcotic agent status: Secondary | ICD-10-CM | POA: Diagnosis not present

## 2018-03-27 DIAGNOSIS — Z79899 Other long term (current) drug therapy: Secondary | ICD-10-CM | POA: Insufficient documentation

## 2018-03-27 DIAGNOSIS — Z6835 Body mass index (BMI) 35.0-35.9, adult: Secondary | ICD-10-CM | POA: Diagnosis not present

## 2018-03-27 DIAGNOSIS — E1169 Type 2 diabetes mellitus with other specified complication: Secondary | ICD-10-CM | POA: Diagnosis not present

## 2018-03-27 HISTORY — PX: ANTERIOR CERVICAL DECOMP/DISCECTOMY FUSION: SHX1161

## 2018-03-27 LAB — BASIC METABOLIC PANEL
Anion gap: 12 (ref 5–15)
BUN: 10 mg/dL (ref 6–20)
CHLORIDE: 105 mmol/L (ref 98–111)
CO2: 23 mmol/L (ref 22–32)
CREATININE: 0.77 mg/dL (ref 0.61–1.24)
Calcium: 9.2 mg/dL (ref 8.9–10.3)
GFR calc Af Amer: >60 mL/min (ref >60)
GFR calc non Af Amer: >60 mL/min (ref >60)
Glucose, Bld: 102 mg/dL — ABNORMAL HIGH (ref 70–99)
Potassium: 3.9 mmol/L (ref 3.5–5.1)
SODIUM: 140 mmol/L (ref 135–145)

## 2018-03-27 LAB — HEMOGLOBIN A1C
Hgb A1c MFr Bld: 6.3 % — ABNORMAL HIGH (ref 4.8–5.6)
Mean Plasma Glucose: 134.11 mg/dL

## 2018-03-27 LAB — GLUCOSE, CAPILLARY
GLUCOSE-CAPILLARY: 186 mg/dL — AB (ref 70–99)
Glucose-Capillary: 93 mg/dL (ref 70–99)
Glucose-Capillary: 156 mg/dL — ABNORMAL HIGH (ref 70–99)
Glucose-Capillary: 153 mg/dL — ABNORMAL HIGH (ref 70–99)

## 2018-03-27 LAB — CBC
HCT: 40 % (ref 39.0–52.0)
Hemoglobin: 13.2 g/dL (ref 13.0–17.0)
MCH: 29.8 pg (ref 26.0–34.0)
MCHC: 33 g/dL (ref 30.0–36.0)
MCV: 90.3 fL (ref 78.0–100.0)
Platelets: 187 10*3/uL (ref 150–400)
RBC: 4.43 MIL/uL (ref 4.22–5.81)
RDW: 14.1 % (ref 11.5–15.5)
WBC: 7.4 10*3/uL (ref 4.0–10.5)

## 2018-03-27 SURGERY — ANTERIOR CERVICAL DECOMPRESSION/DISCECTOMY FUSION 2 LEVELS
Anesthesia: General

## 2018-03-27 MED ORDER — METHOCARBAMOL 500 MG PO TABS
ORAL_TABLET | ORAL | Status: AC
  Filled 2018-03-27: qty 1

## 2018-03-27 MED ORDER — HEMOSTATIC AGENTS (NO CHARGE) OPTIME
TOPICAL | Status: DC | PRN
  Administered 2018-03-27: 1 via TOPICAL

## 2018-03-27 MED ORDER — ALUM & MAG HYDROXIDE-SIMETH 200-200-20 MG/5ML PO SUSP
30.0000 mL | ORAL | Status: DC | PRN
  Administered 2018-03-27: 30 mL via ORAL
  Filled 2018-03-27: qty 30

## 2018-03-27 MED ORDER — THROMBIN (RECOMBINANT) 20000 UNITS EX SOLR
CUTANEOUS | Status: AC
  Filled 2018-03-27: qty 20000

## 2018-03-27 MED ORDER — LABETALOL HCL 5 MG/ML IV SOLN
INTRAVENOUS | Status: DC | PRN
  Administered 2018-03-27: 5 mg via INTRAVENOUS

## 2018-03-27 MED ORDER — FENTANYL CITRATE (PF) 250 MCG/5ML IJ SOLN
INTRAMUSCULAR | Status: AC
  Filled 2018-03-27: qty 5

## 2018-03-27 MED ORDER — FENTANYL CITRATE (PF) 100 MCG/2ML IJ SOLN
INTRAMUSCULAR | Status: AC
  Filled 2018-03-27: qty 2

## 2018-03-27 MED ORDER — HYDROMORPHONE HCL 1 MG/ML IJ SOLN
0.2500 mg | INTRAMUSCULAR | Status: DC | PRN
  Administered 2018-03-27 (×3): 0.5 mg via INTRAVENOUS

## 2018-03-27 MED ORDER — OXYCODONE HCL 5 MG PO TABS: 5.0000 mg | ORAL_TABLET | ORAL | Status: DC | PRN

## 2018-03-27 MED ORDER — MIDAZOLAM HCL 2 MG/2ML IJ SOLN
INTRAMUSCULAR | Status: AC
  Filled 2018-03-27: qty 2

## 2018-03-27 MED ORDER — DEXMEDETOMIDINE HCL 200 MCG/2ML IV SOLN
INTRAVENOUS | Status: DC | PRN
  Administered 2018-03-27 (×4): 8 ug via INTRAVENOUS
  Administered 2018-03-27: 16 ug via INTRAVENOUS

## 2018-03-27 MED ORDER — HYDROMORPHONE HCL 1 MG/ML IJ SOLN
1.0000 mg | INTRAMUSCULAR | Status: DC | PRN
  Administered 2018-03-27 – 2018-03-28 (×2): 1 mg via INTRAVENOUS
  Filled 2018-03-27 (×2): qty 1

## 2018-03-27 MED ORDER — METHOCARBAMOL 500 MG PO TABS
500.0000 mg | ORAL_TABLET | Freq: Four times a day (QID) | ORAL | Status: DC | PRN
  Administered 2018-03-27 – 2018-03-28 (×2): 500 mg via ORAL
  Filled 2018-03-27: qty 1

## 2018-03-27 MED ORDER — LACTATED RINGERS IV SOLN
INTRAVENOUS | Status: DC
  Administered 2018-03-27: 16:00:00 via INTRAVENOUS

## 2018-03-27 MED ORDER — METFORMIN HCL 500 MG PO TABS
1000.0000 mg | ORAL_TABLET | Freq: Every day | ORAL | Status: DC
  Administered 2018-03-28: 1000 mg via ORAL
  Filled 2018-03-27: qty 2

## 2018-03-27 MED ORDER — EPHEDRINE 5 MG/ML INJ
INTRAVENOUS | Status: AC
  Filled 2018-03-27: qty 10

## 2018-03-27 MED ORDER — PROPOFOL 10 MG/ML IV BOLUS
INTRAVENOUS | Status: DC | PRN
  Administered 2018-03-27: 200 mg via INTRAVENOUS

## 2018-03-27 MED ORDER — ROCURONIUM BROMIDE 50 MG/5ML IV SOSY
PREFILLED_SYRINGE | INTRAVENOUS | Status: AC
  Filled 2018-03-27: qty 5

## 2018-03-27 MED ORDER — SODIUM CHLORIDE 0.9% FLUSH: 3.0000 mL | INTRAVENOUS | Status: DC | PRN

## 2018-03-27 MED ORDER — OXYCODONE HCL 5 MG PO TABS
ORAL_TABLET | ORAL | Status: AC
  Filled 2018-03-27: qty 2

## 2018-03-27 MED ORDER — OXYCODONE-ACETAMINOPHEN 10-325 MG PO TABS: 1.0000 | ORAL_TABLET | ORAL | 0 refills | Status: AC | PRN

## 2018-03-27 MED ORDER — OXYCODONE HCL 5 MG PO TABS
10.0000 mg | ORAL_TABLET | ORAL | Status: DC | PRN
  Administered 2018-03-27 – 2018-03-28 (×6): 10 mg via ORAL
  Filled 2018-03-27 (×5): qty 2

## 2018-03-27 MED ORDER — ONDANSETRON HCL 4 MG/2ML IJ SOLN: 4.0000 mg | Freq: Four times a day (QID) | INTRAMUSCULAR | Status: DC | PRN

## 2018-03-27 MED ORDER — 0.9 % SODIUM CHLORIDE (POUR BTL) OPTIME
TOPICAL | Status: DC | PRN
  Administered 2018-03-27: 1000 mL

## 2018-03-27 MED ORDER — MIDAZOLAM HCL 5 MG/5ML IJ SOLN
INTRAMUSCULAR | Status: DC | PRN
  Administered 2018-03-27: 2 mg via INTRAVENOUS

## 2018-03-27 MED ORDER — ONDANSETRON HCL 4 MG/2ML IJ SOLN
INTRAMUSCULAR | Status: AC
  Filled 2018-03-27: qty 2

## 2018-03-27 MED ORDER — LIDOCAINE 2% (20 MG/ML) 5 ML SYRINGE
INTRAMUSCULAR | Status: AC
  Filled 2018-03-27: qty 5

## 2018-03-27 MED ORDER — METHOCARBAMOL 500 MG PO TABS: 500.0000 mg | ORAL_TABLET | Freq: Three times a day (TID) | ORAL | 0 refills | Status: DC

## 2018-03-27 MED ORDER — METHOCARBAMOL 1000 MG/10ML IJ SOLN
500.0000 mg | Freq: Four times a day (QID) | INTRAVENOUS | Status: DC | PRN
  Administered 2018-03-27: 500 mg via INTRAVENOUS
  Filled 2018-03-27: qty 5
  Filled 2018-03-27: qty 500

## 2018-03-27 MED ORDER — HYDRALAZINE HCL 20 MG/ML IJ SOLN
5.0000 mg | INTRAMUSCULAR | Status: DC | PRN
  Administered 2018-03-28 (×3): 5 mg via INTRAVENOUS
  Filled 2018-03-27 (×3): qty 1

## 2018-03-27 MED ORDER — PHENOL 1.4 % MT LIQD: 1.0000 | OROMUCOSAL | Status: DC | PRN

## 2018-03-27 MED ORDER — ACETAMINOPHEN 325 MG PO TABS
650.0000 mg | ORAL_TABLET | ORAL | Status: DC | PRN
  Administered 2018-03-27 – 2018-03-28 (×2): 650 mg via ORAL
  Filled 2018-03-27 (×2): qty 2

## 2018-03-27 MED ORDER — FENTANYL CITRATE (PF) 100 MCG/2ML IJ SOLN
50.0000 ug | Freq: Once | INTRAMUSCULAR | Status: AC
  Administered 2018-03-27: 50 ug via INTRAVENOUS

## 2018-03-27 MED ORDER — ONDANSETRON HCL 4 MG PO TABS: 4.0000 mg | ORAL_TABLET | Freq: Four times a day (QID) | ORAL | Status: DC | PRN

## 2018-03-27 MED ORDER — CEFAZOLIN SODIUM-DEXTROSE 2-4 GM/100ML-% IV SOLN
2.0000 g | INTRAVENOUS | Status: AC
  Administered 2018-03-27: 2 g via INTRAVENOUS
  Filled 2018-03-27: qty 100

## 2018-03-27 MED ORDER — BUPIVACAINE-EPINEPHRINE 0.25% -1:200000 IJ SOLN
INTRAMUSCULAR | Status: DC | PRN
  Administered 2018-03-27: 30 mL

## 2018-03-27 MED ORDER — INSULIN ASPART 100 UNIT/ML ~~LOC~~ SOLN
0.0000 [IU] | Freq: Three times a day (TID) | SUBCUTANEOUS | Status: DC
  Administered 2018-03-27 – 2018-03-28 (×2): 3 [IU] via SUBCUTANEOUS
  Administered 2018-03-28: 2 [IU] via SUBCUTANEOUS

## 2018-03-27 MED ORDER — POLYETHYLENE GLYCOL 3350 17 G PO PACK: 17.0000 g | PACK | Freq: Every day | ORAL | Status: DC | PRN

## 2018-03-27 MED ORDER — SUGAMMADEX SODIUM 200 MG/2ML IV SOLN
INTRAVENOUS | Status: DC | PRN
  Administered 2018-03-27: 200 mg via INTRAVENOUS

## 2018-03-27 MED ORDER — LISINOPRIL 20 MG PO TABS
20.0000 mg | ORAL_TABLET | Freq: Every day | ORAL | Status: DC
  Administered 2018-03-27 – 2018-03-28 (×2): 20 mg via ORAL
  Filled 2018-03-27 (×3): qty 1

## 2018-03-27 MED ORDER — LACTATED RINGERS IV SOLN
INTRAVENOUS | Status: DC
  Administered 2018-03-27 (×2): via INTRAVENOUS

## 2018-03-27 MED ORDER — DEXAMETHASONE SODIUM PHOSPHATE 4 MG/ML IJ SOLN
INTRAMUSCULAR | Status: DC | PRN
  Administered 2018-03-27: 10 mg via INTRAVENOUS

## 2018-03-27 MED ORDER — HYDROMORPHONE HCL 1 MG/ML IJ SOLN
1.0000 mg | INTRAMUSCULAR | Status: DC | PRN
  Administered 2018-03-27: 1 mg via INTRAVENOUS
  Filled 2018-03-27: qty 1

## 2018-03-27 MED ORDER — BUPIVACAINE-EPINEPHRINE (PF) 0.25% -1:200000 IJ SOLN
INTRAMUSCULAR | Status: AC
  Filled 2018-03-27: qty 30

## 2018-03-27 MED ORDER — ONDANSETRON HCL 4 MG/2ML IJ SOLN
INTRAMUSCULAR | Status: DC | PRN
  Administered 2018-03-27: 4 mg via INTRAVENOUS

## 2018-03-27 MED ORDER — DOCUSATE SODIUM 100 MG PO CAPS
100.0000 mg | ORAL_CAPSULE | Freq: Two times a day (BID) | ORAL | Status: DC
  Administered 2018-03-27 – 2018-03-28 (×2): 100 mg via ORAL
  Filled 2018-03-27 (×2): qty 1

## 2018-03-27 MED ORDER — DIAZEPAM 5 MG PO TABS
5.0000 mg | ORAL_TABLET | Freq: Four times a day (QID) | ORAL | Status: DC | PRN
  Administered 2018-03-27 – 2018-03-28 (×3): 10 mg via ORAL
  Filled 2018-03-27 (×3): qty 2

## 2018-03-27 MED ORDER — DEXAMETHASONE SODIUM PHOSPHATE 10 MG/ML IJ SOLN
INTRAMUSCULAR | Status: AC
  Filled 2018-03-27: qty 1

## 2018-03-27 MED ORDER — CEFAZOLIN SODIUM-DEXTROSE 2-4 GM/100ML-% IV SOLN
2.0000 g | Freq: Three times a day (TID) | INTRAVENOUS | Status: AC
  Administered 2018-03-27 – 2018-03-28 (×2): 2 g via INTRAVENOUS
  Filled 2018-03-27 (×2): qty 100

## 2018-03-27 MED ORDER — PREGABALIN 75 MG PO CAPS
75.0000 mg | ORAL_CAPSULE | Freq: Two times a day (BID) | ORAL | Status: DC
  Administered 2018-03-27 – 2018-03-28 (×2): 75 mg via ORAL
  Filled 2018-03-27 (×2): qty 1

## 2018-03-27 MED ORDER — DEXMEDETOMIDINE HCL IN NACL 200 MCG/50ML IV SOLN
INTRAVENOUS | Status: AC
  Filled 2018-03-27: qty 50

## 2018-03-27 MED ORDER — MAGNESIUM CITRATE PO SOLN: 1.0000 | Freq: Once | ORAL | Status: DC | PRN

## 2018-03-27 MED ORDER — ONDANSETRON 4 MG PO TBDP: 4.0000 mg | ORAL_TABLET | Freq: Three times a day (TID) | ORAL | 0 refills | Status: DC | PRN

## 2018-03-27 MED ORDER — PHENYLEPHRINE 40 MCG/ML (10ML) SYRINGE FOR IV PUSH (FOR BLOOD PRESSURE SUPPORT)
PREFILLED_SYRINGE | INTRAVENOUS | Status: DC | PRN
  Administered 2018-03-27: 40 ug via INTRAVENOUS

## 2018-03-27 MED ORDER — HYDROMORPHONE HCL 1 MG/ML IJ SOLN
INTRAMUSCULAR | Status: AC
  Filled 2018-03-27: qty 1

## 2018-03-27 MED ORDER — CEFAZOLIN SODIUM 1 G IJ SOLR
INTRAMUSCULAR | Status: AC
  Filled 2018-03-27: qty 20

## 2018-03-27 MED ORDER — FENTANYL CITRATE (PF) 100 MCG/2ML IJ SOLN
INTRAMUSCULAR | Status: DC | PRN
  Administered 2018-03-27: 100 ug via INTRAVENOUS
  Administered 2018-03-27: 50 ug via INTRAVENOUS
  Administered 2018-03-27: 100 ug via INTRAVENOUS
  Administered 2018-03-27 (×2): 50 ug via INTRAVENOUS

## 2018-03-27 MED ORDER — MIDAZOLAM HCL 2 MG/2ML IJ SOLN
1.0000 mg | Freq: Once | INTRAMUSCULAR | Status: AC
  Administered 2018-03-27: 1 mg via INTRAVENOUS

## 2018-03-27 MED ORDER — MENTHOL 3 MG MT LOZG
1.0000 | LOZENGE | OROMUCOSAL | Status: DC | PRN
  Administered 2018-03-28: 3 mg via ORAL
  Filled 2018-03-27 (×2): qty 9

## 2018-03-27 MED ORDER — ROCURONIUM BROMIDE 50 MG/5ML IV SOSY
PREFILLED_SYRINGE | INTRAVENOUS | Status: DC | PRN
  Administered 2018-03-27: 10 mg via INTRAVENOUS
  Administered 2018-03-27: 30 mg via INTRAVENOUS
  Administered 2018-03-27: 20 mg via INTRAVENOUS
  Administered 2018-03-27: 50 mg via INTRAVENOUS
  Administered 2018-03-27: 20 mg via INTRAVENOUS

## 2018-03-27 MED ORDER — CEFAZOLIN SODIUM-DEXTROSE 2-3 GM-%(50ML) IV SOLR
INTRAVENOUS | Status: DC | PRN
  Administered 2018-03-27: 2 g via INTRAVENOUS

## 2018-03-27 MED ORDER — SODIUM CHLORIDE 0.9% FLUSH: 3.0000 mL | Freq: Two times a day (BID) | INTRAVENOUS | Status: DC

## 2018-03-27 MED ORDER — LISINOPRIL 20 MG PO TABS
20.0000 mg | ORAL_TABLET | Freq: Once | ORAL | Status: AC
  Administered 2018-03-27: 20 mg via ORAL

## 2018-03-27 MED ORDER — INSULIN ASPART 100 UNIT/ML ~~LOC~~ SOLN: 0.0000 [IU] | Freq: Every day | SUBCUTANEOUS | Status: DC

## 2018-03-27 MED ORDER — THROMBIN 20000 UNITS EX SOLR
CUTANEOUS | Status: DC | PRN
  Administered 2018-03-27: 20 mL via TOPICAL

## 2018-03-27 MED ORDER — MORPHINE SULFATE (PF) 2 MG/ML IV SOLN
1.0000 mg | INTRAVENOUS | Status: DC | PRN
  Administered 2018-03-27: 1 mg via INTRAVENOUS
  Filled 2018-03-27: qty 1

## 2018-03-27 MED ORDER — ACETAMINOPHEN 650 MG RE SUPP: 650.0000 mg | RECTAL | Status: DC | PRN

## 2018-03-27 MED ORDER — LIDOCAINE 2% (20 MG/ML) 5 ML SYRINGE
INTRAMUSCULAR | Status: DC | PRN
  Administered 2018-03-27: 100 mg via INTRAVENOUS

## 2018-03-27 SURGICAL SUPPLY — 68 items
BLADE CLIPPER SURG (BLADE) IMPLANT
BONE VIVIGEN FORMABLE 1.3CC (Bone Implant) ×2 IMPLANT
BUR EGG ELITE 4.0 (BURR) IMPLANT
BUR MATCHSTICK NEURO 3.0 LAGG (BURR) IMPLANT
CABLE BIPOLOR RESECTION CORD (MISCELLANEOUS) ×2 IMPLANT
CAGE SPNL 6D 14XMED 16X7X (Cage) ×2 IMPLANT
CANISTER SUCT 3000ML PPV (MISCELLANEOUS) ×2 IMPLANT
CLSR STERI-STRIP ANTIMIC 1/2X4 (GAUZE/BANDAGES/DRESSINGS) ×2 IMPLANT
COVER MAYO STAND STRL (DRAPES) ×6 IMPLANT
COVER SURGICAL LIGHT HANDLE (MISCELLANEOUS) ×4 IMPLANT
CRADLE DONUT ADULT HEAD (MISCELLANEOUS) ×2 IMPLANT
DRAPE C-ARM 42X72 X-RAY (DRAPES) ×2 IMPLANT
DRAPE MICROSCOPE LEICA 46X105 (MISCELLANEOUS) IMPLANT
DRAPE POUCH INSTRU U-SHP 10X18 (DRAPES) ×2 IMPLANT
DRAPE SURG 17X23 STRL (DRAPES) ×2 IMPLANT
DRAPE U-SHAPE 47X51 STRL (DRAPES) ×2 IMPLANT
DRSG OPSITE POSTOP 3X4 (GAUZE/BANDAGES/DRESSINGS) ×2 IMPLANT
DRSG OPSITE POSTOP 4X6 (GAUZE/BANDAGES/DRESSINGS) ×2 IMPLANT
DURAPREP 26ML APPLICATOR (WOUND CARE) ×2 IMPLANT
ELECT COATED BLADE 2.86 ST (ELECTRODE) ×2 IMPLANT
ELECT PENCIL ROCKER SW 15FT (MISCELLANEOUS) ×2 IMPLANT
ELECT REM PT RETURN 9FT ADLT (ELECTROSURGICAL) ×2
ELECTRODE REM PT RTRN 9FT ADLT (ELECTROSURGICAL) ×1 IMPLANT
FLOSEAL 10ML (HEMOSTASIS) IMPLANT
FUSION TCS NANOLOCK 7MM 6DEG (Cage) ×2 IMPLANT
GLOVE BIO SURGEON STRL SZ 6.5 (GLOVE) ×2 IMPLANT
GLOVE BIOGEL PI IND STRL 6.5 (GLOVE) ×1 IMPLANT
GLOVE BIOGEL PI IND STRL 8.5 (GLOVE) ×1 IMPLANT
GLOVE BIOGEL PI INDICATOR 6.5 (GLOVE) ×1
GLOVE BIOGEL PI INDICATOR 8.5 (GLOVE) ×1
GLOVE SS BIOGEL STRL SZ 8.5 (GLOVE) ×1 IMPLANT
GLOVE SUPERSENSE BIOGEL SZ 8.5 (GLOVE) ×1
GOWN STRL REUS W/ TWL LRG LVL3 (GOWN DISPOSABLE) ×1 IMPLANT
GOWN STRL REUS W/TWL 2XL LVL3 (GOWN DISPOSABLE) ×2 IMPLANT
GOWN STRL REUS W/TWL LRG LVL3 (GOWN DISPOSABLE) ×1
KIT BASIN OR (CUSTOM PROCEDURE TRAY) ×2 IMPLANT
KIT TURNOVER KIT B (KITS) ×2 IMPLANT
NEEDLE HYPO 22GX1.5 SAFETY (NEEDLE) ×2 IMPLANT
NEEDLE SPNL 18GX3.5 QUINCKE PK (NEEDLE) ×2 IMPLANT
NS IRRIG 1000ML POUR BTL (IV SOLUTION) ×2 IMPLANT
PACK ORTHO CERVICAL (CUSTOM PROCEDURE TRAY) ×2 IMPLANT
PACK UNIVERSAL I (CUSTOM PROCEDURE TRAY) ×2 IMPLANT
PAD ARMBOARD 7.5X6 YLW CONV (MISCELLANEOUS) ×4 IMPLANT
PATTIES SURGICAL .25X.25 (GAUZE/BANDAGES/DRESSINGS) ×2 IMPLANT
PIN DISTRACTION 14 (PIN) ×4 IMPLANT
PUTTY DBX 1CC (Putty) ×2 IMPLANT
PUTTY DBX 1CC DEPUY (Putty) ×1 IMPLANT
RESTRAINT LIMB HOLDER UNIV (RESTRAINTS) ×2 IMPLANT
RUBBERBAND STERILE (MISCELLANEOUS) IMPLANT
SCREW ENDO BONE 3.8X14MM (Screw) ×4 IMPLANT
SCREW LOCKING 14MMX3.5MM (Screw) ×4 IMPLANT
SPONGE INTESTINAL PEANUT (DISPOSABLE) ×8 IMPLANT
SPONGE LAP 4X18 RFD (DISPOSABLE) ×8 IMPLANT
SPONGE SURGIFOAM ABS GEL 100 (HEMOSTASIS) ×2 IMPLANT
SUT BONE WAX W31G (SUTURE) ×2 IMPLANT
SUT MON AB 3-0 SH 27 (SUTURE) ×1
SUT MON AB 3-0 SH27 (SUTURE) ×1 IMPLANT
SUT SILK 2 0 (SUTURE)
SUT SILK 2-0 18XBRD TIE 12 (SUTURE) IMPLANT
SUT VIC AB 2-0 CT1 18 (SUTURE) ×2 IMPLANT
SYR BULB IRRIGATION 50ML (SYRINGE) ×2 IMPLANT
SYR CONTROL 10ML LL (SYRINGE) ×2 IMPLANT
TAPE CLOTH 4X10 WHT NS (GAUZE/BANDAGES/DRESSINGS) ×2 IMPLANT
TAPE UMBILICAL COTTON 1/8X30 (MISCELLANEOUS) ×6 IMPLANT
TOWEL GREEN STERILE (TOWEL DISPOSABLE) ×2 IMPLANT
TOWEL GREEN STERILE FF (TOWEL DISPOSABLE) ×2 IMPLANT
TRAY FOLEY MTR SLVR 14FR STAT (SET/KITS/TRAYS/PACK) ×2 IMPLANT
WATER STERILE IRR 1000ML POUR (IV SOLUTION) ×2 IMPLANT

## 2018-03-27 NOTE — Transfer of Care (Signed)
Immediate Anesthesia Transfer of Care Note  Patient: Alex [name]  Procedure(s) Performed: ANTERIOR CERVICAL DECOMPRESSION/DISCECTOMY FUSION C3-5 (N/A )  Patient Location: PACU  Anesthesia Type:General  Level of Consciousness: awake  Airway & Oxygen Therapy: Patient Spontanous Breathing and Patient connected to face mask oxygen  Post-op Assessment: Report given to RN and Post -op Vital signs reviewed and stable  Post vital signs: Reviewed and stable  Last Vitals:  Vitals Value Taken Time  BP 176/91 03/27/2018  1:39 PM  Temp    Pulse 88 03/27/2018  1:41 PM  Resp 24 03/27/2018  1:41 PM  SpO2 97 % 03/27/2018  1:41 PM  Vitals shown include unvalidated device data.  Last Pain:  Vitals:   03/27/18 0805  TempSrc:   PainSc: 8       Patients Stated Pain Goal: 0 (03/27/18 0805)  Complications: No apparent anesthesia complications

## 2018-03-27 NOTE — Discharge Instructions (Signed)
Cervical Fusion, Care After °This sheet gives you information about how to care for yourself after your procedure. Your health care provider may also give you more specific instructions. If you have problems or questions, contact your health care provider. °What can I expect after the procedure? °After the procedure, it is common to have: °· Incision area pain. °· Numbness. °· Weakness. °· Sore throat. °· Difficulty swallowing. ° °Follow these instructions at home: °Medicines °· Take over-the-counter and prescription medicines, including pain medicines, only as told by your health care provider. °· If you were prescribed an antibiotic medicine, take it as told by your health care provider. Do not stop taking the antibiotic even if you start to feel better. °If you have a brace: °· Wear the brace as told by your health care provider. Remove it only as told by your health care provider. °· Keep the brace clean. °Incision care °· Follow instructions from your health care provider about how to take care of your incision. Make sure you: °? Wash your hands with soap and water before you change your bandage (dressing). If soap and water are not available, use hand sanitizer. °? Change your dressing as told by your health care provider. °? Leave stitches (sutures), skin glue, or adhesive strips in place. These skin closures may need to be in place for 2 weeks or longer. If adhesive strip edges start to loosen and curl up, you may trim the loose edges. Do not remove adhesive strips completely unless your health care provider tells you to do that. °· Keep your incision clean and dry. Do not take baths, swim, or use a hot tub until your health care provider approves. °· Check your incision area every day for signs of infection. Check for: °? More redness, swelling, or pain. °? More fluid or blood. °? Warmth. °? Pus or a bad smell. °Activity ° °· Rest and protect your back as much as possible. °· Do not lift anything that is  heavier than 10 lb (4.5 kg) or the limit that you are told by your health care provider. °· Do not twist or bend at the waist until your health care provider approves. °· Avoid: °? Pushing and pulling motions. °? Lifting anything over your head. °? Sitting or lying down in the same position for long periods of time. °· Do not exercise until your health care provider approves. Once your health care provider has approved exercise, ask him or her what kinds of exercises you can do to make your back stronger (physical therapy). °· Do not drive until your health care provider approves. °? Do not drive for 24 hours if you received a medicine to help you relax (sedative). °? Do not drive or use heavy machinery while taking prescription pain medicine. °General instructions ° °· Have someone assist you to turn in bed frequently by moving your whole body without twisting your back (log rolling technique). °· Wear compression stockings as told by your health care provider. These stockings help to prevent blood clots and reduce swelling in your legs. °· Do not use any products that contain nicotine or tobacco, such as cigarettes and e-cigarettes. These can delay bone healing. If you need help quitting, ask your health care provider. °· To prevent or treat constipation while you are taking prescription pain medicine, your health care provider may recommend that you: °? Drink enough fluid to keep your urine clear or pale yellow. °? Take over-the-counter or prescription medicines. °? Eat foods   that are high in fiber, such as fresh fruits and vegetables, whole grains, and beans. °? Limit foods that are high in fat and processed sugars, such as fried and sweet foods. °· Keep all follow-up visits as told by your health care provider. This is important. °Contact a health care provider if: °· You have pain that gets worse or does not get better with medicine. °· You have more redness, swelling, or pain around your incision. °· You have  more fluid or blood coming from your incision. °· Your incision feels warm to the touch. °· You have pus or a bad smell coming from your incision. °· You have a fever. °· You have weakness or numbness in your legs that is new or getting worse. °· You have swelling in your calf or leg. °· The edges of your incision break open. °· You vomit or feel nauseous. °· You have trouble controlling urination or bowel movements. °Get help right away if: °· You develop shortness of breath or chest pain. °· You have trouble swallowing. °· You have trouble breathing. °· You develop a cough. °This information is not intended to replace advice given to you by your health care provider. Make sure you discuss any questions you have with your health care provider. °Document Released: 02/07/2004 Document Revised: 01/18/2016 Document Reviewed: 01/04/2016 °Elsevier Interactive Patient Education © 2018 [employer] ° ° ° ° ° ° °

## 2018-03-27 NOTE — Op Note (Signed)
Operative report  Preoperative diagnosis degenerative cervical spondylitic radiculopathy C3-4 and C4-5  Postoperative diagnosis: Same  Operative procedure: ACDF C3-5  Complications: None  First assistant: Anette Riedel, PA  Implants: 0 profile Titan nano lock intervertebral spacer.  7 mm lordotic medium  Graft: vivogen and DBX  Indications: Alex [name] is a very pleasant 57 year old gentleman with complaints of significant neck scapular and right lateral arm/deltoid pain.  Imaging studies demonstrated foraminal stenosis and nerve root compression/irritation at C3-4 and C4-5.  Attempted conservative management consisting of physical therapy, injection therapy, and activity modification had failed to alleviate the patient's symptoms.  Because of the ongoing pain and loss in quality of life he elected to move forward with surgery.  All appropriate risks benefits and alternatives were discussed with the patient and consent was obtained.  Operative report: Patient was brought the operating room placed by the operating room table.  After successful induction of general anesthesia and endotracheal intubation teds SCDs and a Foley were inserted.  The anterior cervical spine was then prepped and draped in a standard fashion.  Timeout was taken to confirm patient procedure and all other important data.  Fluoroscopic view was used to identify the C4 vertebral body.  I marked out this area on the surface and infiltrated the proposed incision site with quarter percent Marcaine with epinephrine.  Transverse incision was then made and sharp dissection was carried out down to the deep fascia.  The platysma was then sharply incised and I continued to dissect sharply into the deep cervical fascia.  The omohyoid muscle was identified and sacrificed for improved visualization.  I ultimately was able to sweep the esophagus to the right and identified to protect the carotid sheath on the left side.  Kitner dissectors were  used to dissect through the remaining prevertebral fascia and expose the anterior longitudinal ligament.  At this point I clear visualization of C3-4 and C4-5 levels.  A needle was placed into the 3 4 disc space and I took an x-ray to confirm is at the appropriate level.  Once confirmed I marked the area and then mobilized the longus coli muscle from the superior aspect of C3 to the inferior aspect of C5.  Once this is done bilaterally I placed a self-retaining Caspar retractor blades underneath the longus coli muscle deflated the endotracheal cuff and expanded the retractor to the appropriate width.  The cuff was then reinflated.  At this point at excellent visualization the anterior aspect of the C3-4 disc.  Annulotomy was performed with a 15 blade scalpel and then I used pituitary rongeurs to remove the bulk of the disc material.  2 and 3 mm Kerrison punch were used to remove the overhanging osteophyte from the inferior aspect of the C3 vertebral body.  Curettes were used to continue my discectomy posteriorly until I arrived at the posterior annulus.  Using a fine nerve hook I ultimately was able to dissect through the posterior annulus and posterior longitudinal ligament and develop a plane under the PLL.  Using a 1 mm Kerrison Roger I resected the PLL and posterior annulus to expose the underlying thecal sac.  I then undercut the uncovertebral joint to adequately decompress the nerve root.  At this point I had a satisfactory decompression of the C3-4 level.  I rasped the endplates and then use the rasping trial to determine the size implant.  The size 7 medium lordotic 0 profile cage was obtained and malleted to the appropriate depth. Two 14 mm length  screws were used to lock the cage in place.  One was going superior to the other inferiorly.  Both screws had excellent purchase.  At this point with the 340 level complete I repositioned my retractor and exposed to see for 5 level.  Distraction pins were placed  into the body of C5 and C4 and I gently distracted the space and using the same discectomy technique completed the discectomy at C4-5.  Again the posterior longitudinal ligament was taken down with a 1 mm Kerrison Roger to allow for adequate decompression and removal of the osteophytes from the uncovertebral joints.  With the discectomy complete I rasped the endplates side bleeding subchondral bone and using trials I ultimately elected to use a size 7 medium lordotic cage.  Again this was packed graft and malleted to the appropriate depth.  The 2 locking screws were then placed.  Both screws had excellent purchase.  At this point I copiously irrigated the wound with normal saline and using FloSeal and bipolar cautery obtained and maintained hemostasis.  All retracting pins were removed no bleeding bone edges were sealed with bone wax.  Esophagus and trachea were inspected and then returned to midline.  Platysma was closed with interrupted 2-0 Vicryl sutures and the skin with 3-0 Monocryl.  Final x-rays were taken which demonstrated satisfactory positioning of the implants and screws in both planes.  Sterile dressing and an Aspen collar were then ultimately applied and the patient was extubated transfer the PACU without incident.  The end of the case all needle sponge counts were correct.  There were no adverse intraoperative events.

## 2018-03-27 NOTE — Consult Note (Addendum)
Reason for Consult: Uncontrolled hypertension. Referring Physician: Dr. Gladstone Lighter.  MD.  Alex [name] is an 57 y.o. male.  HPI: 57 year old male with history of hypertension diabetes mellitus type 2 underwent ACDF today and was found to have elevated blood pressure.  Patient states he has been compliant with his medications lisinopril and metformin.  Recently has been having some stress from moving into his new home.  Has been doing some neck pain after surgery for which she is getting pain medications.  Blood pressure has gone up to 741 systolic.  Additional dose of his lisinopril was given by orthopedic surgeon half hour before I was called.  On my exam patient denies any chest pain shortness of breath any headache visual symptoms or focal deficits.  Past Medical History:  Diagnosis Date  . Diabetes mellitus without complication ([place])   . History of kidney stones    removed 5 months ago  . Hypertension   . PONV (postoperative nausea and vomiting)    AGITATION after anesthesia as well  . Sleep apnea    NO CPAP    Past Surgical History:  Procedure Laterality Date  . arm surgery Right    x2 involving tendons/ulnar nerve  . BACK SURGERY    . CARPAL TUNNEL RELEASE Right   . clavical surgery    . EYE SURGERY Bilateral    glaucoma  . HERNIA REPAIR    . JOINT REPLACEMENT Right 06/2011  . TRICEPS TENDON REPAIR      Family History  Problem Relation Age of Onset  . Cancer Mother   . Diabetes Father   . Hypertension Father   . Heart attack Father     Social History:  reports that he has never smoked. His smokeless tobacco use includes chew. He reports that he does not drink alcohol or use drugs.  Allergies:  Allergies  Allergen Reactions  . Buprenorphine Hcl-Naloxone Hcl Nausea Only  . Codeine Nausea Only and Other (See Comments)    per pt, "felt weird in my head"    Medications: I have reviewed the patient's current medications.  Results for orders placed or performed during  the hospital encounter of 03/27/18 (from the past 48 hour(s))  CBC     Status: None   Collection Time: 03/27/18  7:43 AM  Result Value Ref Range   WBC 7.4 4.0 - 10.5 K/uL   RBC 4.43 4.22 - 5.81 MIL/uL   Hemoglobin 13.2 13.0 - 17.0 g/dL   HCT 40.0 39.0 - 52.0 %   MCV 90.3 78.0 - 100.0 fL   MCH 29.8 26.0 - 34.0 pg   MCHC 33.0 30.0 - 36.0 g/dL   RDW 14.1 11.5 - 15.5 %   Platelets 187 150 - 400 K/uL    Comment: Performed at [facility] [address], [place], [place] [address]  Basic metabolic panel     Status: Abnormal   Collection Time: 03/27/18  7:43 AM  Result Value Ref Range   Sodium 140 135 - 145 mmol/L   Potassium 3.9 3.5 - 5.1 mmol/L   Chloride 105 98 - 111 mmol/L   CO2 23 22 - 32 mmol/L   Glucose, Bld 102 (H) 70 - 99 mg/dL   BUN 10 6 - 20 mg/dL   Creatinine, Ser 0.77 0.61 - 1.24 mg/dL   Calcium 9.2 8.9 - 10.3 mg/dL   GFR calc non Af Amer >60 >60 mL/min   GFR calc Af Amer >60 >60 mL/min  Comment: (NOTE) The eGFR has been calculated using the CKD EPI equation. This calculation has not been validated in all clinical situations. eGFR's persistently <60 mL/min signify possible Chronic Kidney Disease.    Anion gap 12 5 - 15    Comment: Performed at [facility] [address], [place], [place] [address]  Hemoglobin A1c     Status: Abnormal   Collection Time: 03/27/18  7:43 AM  Result Value Ref Range   Hgb A1c MFr Bld 6.3 (H) 4.8 - 5.6 %    Comment: (NOTE) Pre diabetes:          5.7%-6.4% Diabetes:              >6.4% Glycemic control for   <7.0% adults with diabetes    Mean Plasma Glucose 134.11 mg/dL    Comment: Performed at [facility] [address] [place], [place] [address]  Glucose, capillary     Status: None   Collection Time: 03/27/18  7:55 AM  Result Value Ref Range   Glucose-Capillary 93 70 - 99 mg/dL  Glucose, capillary     Status: Abnormal   Collection Time: 03/27/18  1:44 PM  Result Value Ref Range   Glucose-Capillary 156 (H)  70 - 99 mg/dL  Glucose, capillary     Status: Abnormal   Collection Time: 03/27/18  4:25 PM  Result Value Ref Range   Glucose-Capillary 186 (H) 70 - 99 mg/dL  Glucose, capillary     Status: Abnormal   Collection Time: 03/27/18  9:01 PM  Result Value Ref Range   Glucose-Capillary 153 (H) 70 - 99 mg/dL    Dg Cervical Spine 2-3 Views  Result Date: 03/27/2018 CLINICAL DATA:  Surgical anterior fusion of C3-4 and C4-5. EXAM: DG C-ARM 61-120 MIN; CERVICAL SPINE - 2-3 VIEW FLUOROSCOPY TIME:  36 seconds. COMPARISON:  Radiographs of Nov 25, 2017. FINDINGS: Two intraoperative fluoroscopic images of the cervical spine were obtained. Status post surgical anterior fusion of C3-4 and C4-5. Good alignment of vertebral bodies is noted. IMPRESSION: Status post surgical anterior fusion of C3-4 and C4-5. Electronically Signed   By: Marijo Conception, M.D.   On: 03/27/2018 14:32   Dg C-arm 1-60 Min  Result Date: 03/27/2018 CLINICAL DATA:  Surgical anterior fusion of C3-4 and C4-5. EXAM: DG C-ARM 61-120 MIN; CERVICAL SPINE - 2-3 VIEW FLUOROSCOPY TIME:  36 seconds. COMPARISON:  Radiographs of Nov 25, 2017. FINDINGS: Two intraoperative fluoroscopic images of the cervical spine were obtained. Status post surgical anterior fusion of C3-4 and C4-5. Good alignment of vertebral bodies is noted. IMPRESSION: Status post surgical anterior fusion of C3-4 and C4-5. Electronically Signed   By: Marijo Conception, M.D.   On: 03/27/2018 14:32   Dg C-arm 1-60 Min  Result Date: 03/27/2018 CLINICAL DATA:  Surgical anterior fusion of C3-4 and C4-5. EXAM: DG C-ARM 61-120 MIN; CERVICAL SPINE - 2-3 VIEW FLUOROSCOPY TIME:  36 seconds. COMPARISON:  Radiographs of Nov 25, 2017. FINDINGS: Two intraoperative fluoroscopic images of the cervical spine were obtained. Status post surgical anterior fusion of C3-4 and C4-5. Good alignment of vertebral bodies is noted. IMPRESSION: Status post surgical anterior fusion of C3-4 and C4-5. Electronically  Signed   By: Marijo Conception, M.D.   On: 03/27/2018 14:32    Review of Systems  Constitutional: Negative.   HENT: Negative.   Eyes: Negative.   Respiratory: Negative.   Cardiovascular: Negative.   Gastrointestinal: Negative.   Genitourinary: Negative.  Musculoskeletal: Positive for neck pain.  Skin: Negative.   Neurological: Negative.   Endo/Heme/Allergies: Negative.   Psychiatric/Behavioral: Negative.    Blood pressure (!) 197/107, pulse 72, temperature 98.3 F (36.8 C), temperature source Oral, resp. rate 20, height 5' 6" (1.676 m), weight 98.9 kg, SpO2 95 %. Physical Exam  Constitutional: He is oriented to person, place, and time. He appears well-developed and well-nourished.  HENT:  Head: Normocephalic and atraumatic.  Right Ear: External ear normal.  Left Ear: External ear normal.  Eyes: Pupils are equal, round, and reactive to light. Conjunctivae are normal. Right eye exhibits no discharge. Left eye exhibits no discharge.  Neck:  Patient is on neck collar.  Cardiovascular: Normal rate and regular rhythm.  Respiratory: Effort normal and breath sounds normal.  GI: Soft. Bowel sounds are normal.  Musculoskeletal: Normal range of motion. He exhibits no edema, tenderness or deformity.  Neurological: He is alert and oriented to person, place, and time. No cranial nerve deficit. Coordination normal.  Skin: Skin is warm and dry.  Psychiatric: His behavior is normal.    Assessment/Plan: #1.  Hypertensive urgency -likely precipitated by stress and pain.  For the hypertension patient is asymptomatic denies any chest pain shortness of breath headache or any focal deficits.  Patient is getting adequate pain control.  Has received additional dose of lisinopril.  I have ordered PRN IV hydralazine 5 mg for systolic more than 180.  Patient blood pressure is already reduced by 20% at the time of my arrival.  Will slowly reduce blood pressure.  Maintaining the map.   #2.  Diabetes mellitus  type 2 on metformin as outpatient will continue with sliding scale coverage. #3.  Neck surgery being followed by orthopedic surgeon.  Thanks for involving us in patient's care.  Arshad N Kakrakandy 03/27/2018, 9:09 PM     

## 2018-03-27 NOTE — Anesthesia Preprocedure Evaluation (Signed)
Anesthesia Evaluation  Patient identified by MRN, date of birth, ID band Patient awake    Reviewed: Allergy & Precautions, NPO status , Patient's Chart, lab work & pertinent test results  Airway Mallampati: II  TM Distance: >3 FB Neck ROM: Full    Dental  (+) Partial Upper, Dental Advisory Given   Pulmonary    breath sounds clear to auscultation       Cardiovascular hypertension,  Rhythm:Regular Rate:Normal     Neuro/Psych    GI/Hepatic   Endo/Other  diabetes  Renal/GU      Musculoskeletal   Abdominal (+) + obese,   Peds  Hematology   Anesthesia Other Findings   Reproductive/Obstetrics                             Anesthesia Physical Anesthesia Plan  ASA: III  Anesthesia Plan: General   Post-op Pain Management:    Induction: Intravenous  PONV Risk Score and Plan: Ondansetron and Metaclopromide  Airway Management Planned: Oral ETT  Additional Equipment:   Intra-op Plan:   Post-operative Plan: Extubation in OR  Informed Consent: I have reviewed the patients History and Physical, chart, labs and discussed the procedure including the risks, benefits and alternatives for the proposed anesthesia with the patient or authorized representative who has indicated his/her understanding and acceptance.   Dental advisory given  Plan Discussed with: CRNA and Anesthesiologist  Anesthesia Plan Comments:         Anesthesia Quick Evaluation

## 2018-03-27 NOTE — Progress Notes (Signed)
Noted patient's BP elevated 201/126 at 1918.  Pt s/p C3-5 ACDF, c/o neck and shoulder pain , 10/10 when dilaudid 1 mg IV given at 1830. Pain level down to 7/10,rechecked BP still elevated at 197/107. Denies nausea, headache,blurred vision. On call for Ortho notified- dr Darrelyn HillockGioffre and received order to give another dose of lisinopril 20 mg po. Received a call from [employer] Amber C. And put an order for hospitalist consult. Dr Rueben Bashkakrandy came in and checked patient with the new order of Hydralazine 5 mg PRN. Last BP 175/114 10 minutes after dose of dilaudid 1 mg IV   03/27/18 1918 03/27/18 2038 03/27/18 2145  Vitals  Temp 98.3 F (36.8 C)  --   --   Temp Source Oral  --   --   BP (!) 201/126 (!) 197/107 (!) 195/105  MAP (mmHg) 148 129  --   BP Location Right Arm  --  Right Arm  BP Method Automatic  --  Manual  Patient Position (if appropriate) Sitting  --   --   Pulse Rate 75 72  --   Pulse Rate Source  --   --   --   Resp 20  --   --   Oxygen Therapy  SpO2 95 %  --   --   O2 Device Room Air  --   --     03/27/18 2150 03/27/18 2216  Vitals  Temp  --  98.3 F (36.8 C)  Temp Source  --  Oral  BP (!) 195/120 (!) 175/114  MAP (mmHg) 143 121  BP Location Right Arm Right Arm  BP Method Automatic Automatic  Patient Position (if appropriate) Sitting Sitting  Pulse Rate 86 90  Pulse Rate Source Monitor  --   Resp 18 18  Oxygen Therapy  SpO2 100 % 97 %  O2 Device Room [employer] Room Air  . Hospitalist on call made aware about the last reading and gave parameter  To just give hydralazine for sbp>180 and dbp>120. Patient verbalized feeling much better at this time. Will monitor patient closely

## 2018-03-27 NOTE — Anesthesia Postprocedure Evaluation (Signed)
Anesthesia Post Note  Patient: Alex [name]  Procedure(s) Performed: ANTERIOR CERVICAL DECOMPRESSION/DISCECTOMY FUSION C3-5 (N/A )     Patient location during evaluation: PACU Anesthesia Type: General Level of consciousness: awake and alert Pain management: pain level controlled Vital Signs Assessment: post-procedure vital signs reviewed and stable Respiratory status: spontaneous breathing, nonlabored ventilation, respiratory function stable and patient connected to nasal cannula oxygen Cardiovascular status: blood pressure returned to baseline and stable Postop Assessment: no apparent nausea or vomiting Anesthetic complications: no    Last Vitals:  Vitals:   03/27/18 1445 03/27/18 1519  BP:  (!) 164/98  Pulse: 74 85  Resp: 16 20  Temp:  36.9 C  SpO2: 97% 96%    Last Pain:  Vitals:   03/27/18 1519  TempSrc: Oral  PainSc:     LLE Motor Response: (P) Purposeful movement (03/27/18 1520) LLE Sensation: (P) Full sensation (03/27/18 1520) RLE Motor Response: (P) Purposeful movement (03/27/18 1520) RLE Sensation: (P) Full sensation (03/27/18 1520)      Helene Bernstein COKER

## 2018-03-27 NOTE — Anesthesia Procedure Notes (Signed)
Procedure Name: Intubation Date/Time: 03/27/2018 10:07 AM Performed by: Montez Moritaarter, Ghalia Reicks W, CRNA Pre-anesthesia Checklist: Patient identified, Emergency Drugs available, Suction available and Patient being monitored Patient Re-evaluated:Patient Re-evaluated prior to induction Oxygen Delivery Method: Circle system utilized Preoxygenation: Pre-oxygenation with 100% oxygen Induction Type: IV induction Ventilation: Mask ventilation without difficulty Laryngoscope Size: Miller and 2 Grade View: Grade I Tube type: Oral Tube size: 7.5 mm Number of attempts: 1 Airway Equipment and Method: Stylet and Oral airway Placement Confirmation: ETT inserted through vocal cords under direct vision,  positive ETCO2 and breath sounds checked- equal and bilateral Secured at: 24 cm Tube secured with: Tape Dental Injury: Teeth and Oropharynx as per pre-operative assessment

## 2018-03-27 NOTE — Brief Op Note (Signed)
03/27/2018  1:48 PM  PATIENT:  Alease Medinaonald Sampley  57 y.o. male  PRE-OPERATIVE DIAGNOSIS:  Cervical spondylotosis, degenerative disc disease with neck pain  POST-OPERATIVE DIAGNOSIS:  Cervical spondylotosis, degenerative disc disease with neck pain  PROCEDURE:  Procedure(s) with comments: ANTERIOR CERVICAL DECOMPRESSION/DISCECTOMY FUSION C3-5 (N/A) - 3.5 hrs  SURGEON:  Surgeon(s) and Role:    Venita Lick* Kosisochukwu Goldberg, MD - Primary  PHYSICIAN ASSISTANT:   ASSISTANTS: Carmen Mayo   ANESTHESIA:   none  EBL:  75 mL   BLOOD ADMINISTERED:none  DRAINS: none   LOCAL MEDICATIONS USED:  MARCAINE     SPECIMEN:  No Specimen  DISPOSITION OF SPECIMEN:  N/A  COUNTS:  YES  TOURNIQUET:  * No tourniquets in log *  DICTATION: .Dragon Dictation  PLAN OF CARE: Admit for overnight observation  PATIENT DISPOSITION:  PACU - hemodynamically stable.

## 2018-03-28 ENCOUNTER — Encounter (HOSPITAL_COMMUNITY): Payer: Self-pay | Admitting: Orthopedic Surgery

## 2018-03-28 DIAGNOSIS — I160 Hypertensive urgency: Secondary | ICD-10-CM | POA: Diagnosis not present

## 2018-03-28 DIAGNOSIS — M50121 Cervical disc disorder at C4-C5 level with radiculopathy: Secondary | ICD-10-CM | POA: Diagnosis not present

## 2018-03-28 DIAGNOSIS — E1169 Type 2 diabetes mellitus with other specified complication: Secondary | ICD-10-CM

## 2018-03-28 DIAGNOSIS — E669 Obesity, unspecified: Secondary | ICD-10-CM

## 2018-03-28 LAB — GLUCOSE, CAPILLARY
GLUCOSE-CAPILLARY: 134 mg/dL — AB (ref 70–99)
Glucose-Capillary: 168 mg/dL — ABNORMAL HIGH (ref 70–99)
Glucose-Capillary: 127 mg/dL — ABNORMAL HIGH (ref 70–99)

## 2018-03-28 LAB — BASIC METABOLIC PANEL
Anion gap: 10 (ref 5–15)
BUN: 11 mg/dL (ref 6–20)
CO2: 27 mmol/L (ref 22–32)
CREATININE: 0.76 mg/dL (ref 0.61–1.24)
Calcium: 9.1 mg/dL (ref 8.9–10.3)
Chloride: 102 mmol/L (ref 98–111)
GFR calc non Af Amer: >60 mL/min (ref >60)
Glucose, Bld: 178 mg/dL — ABNORMAL HIGH (ref 70–99)
Potassium: 4.3 mmol/L (ref 3.5–5.1)
SODIUM: 139 mmol/L (ref 135–145)

## 2018-03-28 MED ORDER — LISINOPRIL 20 MG PO TABS
40.0000 mg | ORAL_TABLET | Freq: Every day | ORAL | Status: DC
  Administered 2018-03-28: 40 mg via ORAL
  Filled 2018-03-28: qty 2

## 2018-03-28 MED ORDER — CARVEDILOL 6.25 MG PO TABS
6.2500 mg | ORAL_TABLET | Freq: Two times a day (BID) | ORAL | Status: DC
  Administered 2018-03-28 (×2): 6.25 mg via ORAL
  Filled 2018-03-28 (×2): qty 1

## 2018-03-28 MED FILL — Thrombin (Recombinant) For Soln 20000 Unit: CUTANEOUS | Qty: 1 | Status: AC

## 2018-03-28 NOTE — Progress Notes (Addendum)
    Subjective: 1 Day Post-Op Procedure(s) (LRB): ANTERIOR CERVICAL DECOMPRESSION/DISCECTOMY FUSION C3-5 (N/A) Patient reports pain as 3 on 0-10 scale.   Denies CP or SOB.  Voiding without difficulty. Positive flatus. Pt is ambulating in hallway. Pt reports decreased arm and shoulder pain.  Objective: Vital signs in last 24 hours: Temp:  [97.6 F (36.4 C)-98.4 F (36.9 C)] 97.6 F (36.4 C) (09/20 0344) Pulse Rate:  [68-108] 88 (09/20 0528) Resp:  [14-27] 20 (09/20 0344) BP: (150-212)/(91-126) 170/104 (09/20 0528) SpO2:  [92 %-100 %] 97 % (09/20 0344) Weight:  [98.9 kg] 98.9 kg (09/19 1023)  Intake/Output from previous day: 09/19 0701 - 09/20 0700 In: 1800.9 [I.V.:1550.9; IV Piggyback:250] Out: 475 [Urine:400; Blood:75] Intake/Output this shift: No intake/output data recorded.  Labs: Recent Labs    03/27/18 0743  HGB 13.2   Recent Labs    03/27/18 0743  WBC 7.4  RBC 4.43  HCT 40.0  PLT 187   Recent Labs    03/27/18 0743 03/28/18 0431  NA 140 139  K 3.9 4.3  CL 105 102  CO2 23 27  BUN 10 11  CREATININE 0.77 0.76  GLUCOSE 102* 178*  CALCIUM 9.2 9.1   No results for input(s): LABPT, INR in the last 72 hours.  Physical Exam: Neurologically intact ABD soft Sensation intact distally Dorsiflexion/Plantar flexion intact Incision: no drainage Compartment soft Body mass index is 35.19 kg/m. Aspen collar in place  Assessment/Plan: 1 Day Post-Op Procedure(s) (LRB): ANTERIOR CERVICAL DECOMPRESSION/DISCECTOMY FUSION C3-5 (N/A) Advance diet Up with therapy We will continue to monitor BP Hopeful to DC after cleared by PT Pt is going to contact his PCP for an appt today  Mayo, Baxter Kailarmen Christina for Dr. Venita Lickahari Tavaras [facility] [phone] 03/28/2018, 7:06 AM  Pain well controlled Ambulating without difficulty Neuro intact - no significant radicular arm pain HTN remains an issue Appreciate Medical team input.  Will hold on d/c to home  until cleared by medical team Patient spoke with PCP and setup outpatient evaluation on Monday

## 2018-03-28 NOTE — Progress Notes (Signed)
PROGRESS NOTE    Alex [name]  ZOX:[id number] DOB: 14-Oct-1960 DOA: 03/27/2018 PCP: Dettinger, Elige Radon, MD  Outpatient Specialists:     Brief Narrative:  Patient is a 57 year old male with history of hypertension and diabetes mellitus type 2.  Patient underwent ACDF yesterday and was found to have elevated blood pressure.  Patient stated he had been compliant with his medications lisinopril and metformin.  Postop, patient was noted to have significantly elevated blood pressure.  Systolic blood pressure  earlier today was 203/119 mmHg.  Patient has been on lisinopril 20 mg p.o. once daily.  Will ensure adequate pain control.  Cautiously controlled blood pressure.  Will increase the dose of lisinopril.  Will likely add Coreg.  Monitor blood pressure closely.  Discussed above with the orthopedic surgeon, Dr. Shon Baton.  Advised patient to check blood pressure at least 4 times daily, and to call the primary care provider for systolic blood pressure greater than 170 mmHg or less than 110 mmHg.    Assessment & Plan:   Active Problems:   Diabetes mellitus type 2 in obese (HCC)   S/P cervical spinal fusion   Hypertensive urgency  Hypertensive urgency: Increase lisinopril to 40 mg p.o. once daily. Add Coreg. Adequate pain control. Monitor blood pressure and heart rate closely Monitor renal function. -If discharged back on today, patient will need to check blood pressure at least 4 times daily, document readings and reviewed with your primary care provider. -Please call your primary care provider for blood pressure greater than or less than 110 mmHg, or heart rate less than 50 bpm  Diabetes mellitus: Continue to optimize. Continue metformin and sliding scale insulin coverage.  Status post neck surgery: As per orthopedic team.  Procedures:   Recent neck surgery by the orthopedic team.  Antimicrobials:   None   Subjective: No chest pain or shortness of breath. No fever or  chills.  Objective: Vitals:   03/28/18 0346 03/28/18 0414 03/28/18 0528 03/28/18 0749  BP: (!) 202/120 (!) 180/100 (!) 170/104 (!) 203/119  Pulse: 97  88 75  Resp:    20  Temp:      TempSrc:      SpO2:    100%  Weight:      Height:        Intake/Output Summary (Last 24 hours) at 03/28/2018 0800 Last data filed at 03/28/2018 0626 Gross per 24 hour  Intake 1800.92 ml  Output 475 ml  Net 1325.92 ml   Filed Weights   03/27/18 1023  Weight: 98.9 kg    Examination:  General exam: Appears calm and comfortable.  Morbidly obese. Respiratory system: Clear to auscultation.  Cardiovascular system: S1 & S2 heard.  No pedal edema. Gastrointestinal system: Abdomen is morbidly obese, soft and nontender. No organomegaly or masses felt. Normal bowel sounds heard. Central nervous system: Alert and oriented. No focal neurological deficits. Extremities: No leg edema  Data Reviewed: I have personally reviewed following labs and imaging studies  CBC: Recent Labs  Lab 03/27/18 0743  WBC 7.4  HGB 13.2  HCT 40.0  MCV 90.3  PLT 187   Basic Metabolic Panel: Recent Labs  Lab 03/27/18 0743 03/28/18 0431  NA 140 139  K 3.9 4.3  CL 105 102  CO2 23 27  GLUCOSE 102* 178*  BUN 10 11  CREATININE 0.77 0.76  CALCIUM 9.2 9.1   GFR: Estimated Creatinine Clearance: 112.1 mL/min (by C-G formula based on SCr of 0.76 mg/dL). Liver Function Tests: No  results for input(s): AST, ALT, ALKPHOS, BILITOT, PROT, ALBUMIN in the last 168 hours. No results for input(s): LIPASE, AMYLASE in the last 168 hours. No results for input(s): AMMONIA in the last 168 hours. Coagulation Profile: No results for input(s): INR, PROTIME in the last 168 hours. Cardiac Enzymes: No results for input(s): CKTOTAL, CKMB, CKMBINDEX, TROPONINI in the last 168 hours. BNP (last 3 results) No results for input(s): PROBNP in the last 8760 hours. HbA1C: Recent Labs    03/27/18 0743  HGBA1C 6.3*   CBG: Recent Labs  Lab  03/27/18 0755 03/27/18 1344 03/27/18 1625 03/27/18 2101 03/28/18 0602  GLUCAP 93 156* 186* 153* 168*   Lipid Profile: No results for input(s): CHOL, HDL, LDLCALC, TRIG, CHOLHDL, LDLDIRECT in the last 72 hours. Thyroid Function Tests: No results for input(s): TSH, T4TOTAL, FREET4, T3FREE, THYROIDAB in the last 72 hours. Anemia Panel: No results for input(s): VITAMINB12, FOLATE, FERRITIN, TIBC, IRON, RETICCTPCT in the last 72 hours. Urine analysis: No results found for: COLORURINE, APPEARANCEUR, LABSPEC, PHURINE, GLUCOSEU, HGBUR, BILIRUBINUR, KETONESUR, PROTEINUR, UROBILINOGEN, NITRITE, LEUKOCYTESUR Sepsis Labs: @LABRCNTIP (procalcitonin:4,lacticidven:4)  )No results found for this or any previous visit (from the past 240 hour(s)).       Radiology Studies: Dg Cervical Spine 2-3 Views  Result Date: 03/27/2018 CLINICAL DATA:  Surgical anterior fusion of C3-4 and C4-5. EXAM: DG C-ARM 61-120 MIN; CERVICAL SPINE - 2-3 VIEW FLUOROSCOPY TIME:  36 seconds. COMPARISON:  Radiographs of Nov 25, 2017. FINDINGS: Two intraoperative fluoroscopic images of the cervical spine were obtained. Status post surgical anterior fusion of C3-4 and C4-5. Good alignment of vertebral bodies is noted. IMPRESSION: Status post surgical anterior fusion of C3-4 and C4-5. Electronically Signed   By: Lupita Raider, M.D.   On: 03/27/2018 14:32   Dg C-arm 1-60 Min  Result Date: 03/27/2018 CLINICAL DATA:  Surgical anterior fusion of C3-4 and C4-5. EXAM: DG C-ARM 61-120 MIN; CERVICAL SPINE - 2-3 VIEW FLUOROSCOPY TIME:  36 seconds. COMPARISON:  Radiographs of Nov 25, 2017. FINDINGS: Two intraoperative fluoroscopic images of the cervical spine were obtained. Status post surgical anterior fusion of C3-4 and C4-5. Good alignment of vertebral bodies is noted. IMPRESSION: Status post surgical anterior fusion of C3-4 and C4-5. Electronically Signed   By: Lupita Raider, M.D.   On: 03/27/2018 14:32   Dg C-arm 1-60 Min  Result  Date: 03/27/2018 CLINICAL DATA:  Surgical anterior fusion of C3-4 and C4-5. EXAM: DG C-ARM 61-120 MIN; CERVICAL SPINE - 2-3 VIEW FLUOROSCOPY TIME:  36 seconds. COMPARISON:  Radiographs of Nov 25, 2017. FINDINGS: Two intraoperative fluoroscopic images of the cervical spine were obtained. Status post surgical anterior fusion of C3-4 and C4-5. Good alignment of vertebral bodies is noted. IMPRESSION: Status post surgical anterior fusion of C3-4 and C4-5. Electronically Signed   By: Lupita Raider, M.D.   On: 03/27/2018 14:32        Scheduled Meds: . carvedilol  6.25 mg Oral BID WC  . docusate sodium  100 mg Oral BID  . insulin aspart  0-15 Units Subcutaneous TID WC  . insulin aspart  0-5 Units Subcutaneous QHS  . lisinopril  40 mg Oral Daily  . metFORMIN  1,000 mg Oral Q breakfast  . pregabalin  75 mg Oral BID  . sodium chloride flush  3 mL Intravenous Q12H   Continuous Infusions: . lactated ringers 10 mL/hr at 03/27/18 0811  . lactated ringers Stopped (03/28/18 0610)  . methocarbamol (ROBAXIN) IV Stopped (03/28/18 0000)  LOS: 0 days    Time spent: 25 minutes    Berton MountSylvester Joory Gough, MD  [facility] Pager #: [phone] 7PM-7AM contact night coverage as above

## 2018-03-28 NOTE — Progress Notes (Signed)
Patient seen earlier by Hospitalist about his high blood pressure. Hospitalist requested if BP is under 170/100, patient can go home as patient has a scheduled appointment with his PCP on Monday. Hospitalist educated patient about checking BP at home and wrote prescription for BP meds to take at home.

## 2018-03-28 NOTE — Progress Notes (Signed)
Patient is discharged from room 3C07 at this time. Alert and in stable condition. IV site d/c'd and instructions read to patient and spouse with understanding verbalized. Left unit via wheelchair with all belongings at side. 

## 2018-03-28 NOTE — Evaluation (Signed)
Physical Therapy Evaluation Patient Details Name: Alex [name] MRN: [id number] DOB: [birth year] Today's Date: 03/28/2018   History of Present Illness  Pt is a 57 y/o male s/p ACDF C3-5. Pt has a past medical history of DM without complication, History of kidney stones, Hypertension, PONV, and Sleep apnea.  Clinical Impression  Patient evaluated by Physical Therapy with no further acute PT needs identified. Session focused on reiterating cervical precautions with ADL's and functional mobility. Also demonstrated appropriate technique for car transfer. Patient independent with hallway ambulation with no assistive device. All education has been completed and the patient has no further questions.  No follow-up Physical Therapy or equipment needs. PT is signing off. Thank you for this referral.    Follow Up Recommendations No PT follow up    Equipment Recommendations  None recommended by PT    Recommendations for Other Services       Precautions / Restrictions Precautions Precautions: Cervical Precaution Booklet Issued: Yes (comment) Precaution Comments: OT reviewed handout in full; verbally reiterated with patient Required Braces or Orthoses: Cervical Brace Cervical Brace: Hard collar;At all times;For comfort(Pt needs for cervical precaution maintenance) Spinal Brace: Applied in sitting position Restrictions Weight Bearing Restrictions: No      Mobility  Bed Mobility               General bed mobility comments: Pt in hallway at beginning of session  Transfers Overall transfer level: Independent Equipment used: None                Ambulation/Gait Ambulation/Gait assistance: Independent Gait Distance (Feet): 350 Feet Assistive device: None Gait Pattern/deviations: WFL(Within Functional Limits)   Gait velocity interpretation: >2.62 ft/sec, indicative of community ambulatory General Gait Details: Patient independent with ambulation utilizing upright posture and  adequate gait speed  Stairs            Wheelchair Mobility    Modified Rankin (Stroke Patients Only)       Balance Overall balance assessment: No apparent balance deficits (not formally assessed)                                           Pertinent Vitals/Pain Pain Assessment: 0-10 Pain Score: 3  Pain Location: neck Pain Descriptors / Indicators: Radiating;Sore Pain Intervention(s): Monitored during session    Home Living Family/patient expects to be discharged to:: Private residence Living Arrangements: Children(daughter will be staying with him) Available Help at Discharge: Family;Available PRN/intermittently Type of Home: House Home Access: Level entry(garage level entry)     Home Layout: One level Home Equipment: Shower seat - built in;Grab bars - tub/shower;Hand held shower head      Prior Function Level of Independence: Independent               Hand Dominance   Dominant Hand: Right    Extremity/Trunk Assessment   Upper Extremity Assessment Upper Extremity Assessment: Overall WFL for tasks assessed    Lower Extremity Assessment Lower Extremity Assessment: Overall WFL for tasks assessed    Cervical / Trunk Assessment Cervical / Trunk Assessment: Other exceptions Cervical / Trunk Exceptions: s/p cervical sx  Communication   Communication: No difficulties  Cognition Arousal/Alertness: Awake/alert Behavior During Therapy: WFL for tasks assessed/performed Overall Cognitive Status: Within Functional Limits for tasks assessed  General Comments General comments (skin integrity, edema, etc.): Pt was trying to "pop his neck" several times throughout session - educated that Pt should NOT do this at all costs. "I'll try, its just habit"    Exercises     Assessment/Plan    PT Assessment Patent does not need any further PT services  PT Problem List         PT Treatment  Interventions      PT Goals (Current goals can be found in the Care Plan section)  Acute Rehab PT Goals Patient Stated Goal: to get home PT Goal Formulation: All assessment and education complete, DC therapy    Frequency     Barriers to discharge        Co-evaluation               AM-PAC PT "6 Clicks" Daily Activity  Outcome Measure Difficulty turning over in bed (including adjusting bedclothes, sheets and blankets)?: None Difficulty moving from lying on back to sitting on the side of the bed? : None Difficulty sitting down on and standing up from a chair with arms (e.g., wheelchair, bedside commode, etc,.)?: None Help needed moving to and from a bed to chair (including a wheelchair)?: None Help needed walking in hospital room?: None Help needed climbing 3-5 steps with a railing? : None 6 Click Score: 24    End of Session Equipment Utilized During Treatment: Cervical collar Activity Tolerance: Patient tolerated treatment well Patient left: in bed;with call bell/phone within reach Nurse Communication: Mobility status PT Visit Diagnosis: Difficulty in walking, not elsewhere classified (R26.2);Pain Pain - part of body: (cervical)    Time: 1610-9604 PT Time Calculation (min) (ACUTE ONLY): 13 min   Charges:   PT Evaluation $PT Eval Low Complexity: 1 Low         Laurina Bustle, PT, DPT Acute Rehabilitation Services Pager [phone] Office [phone]   Vanetta Mulders 03/28/2018, 10:05 AM

## 2018-03-28 NOTE — Progress Notes (Signed)
Pts BP cont to be elevated- 212/124 at 0344. Cont to deny nausea,headache nor vision changes. However c/o neck pain, more on the left side 5/10 pain level. Oxycodone 10 mg  And Valium given. Rechecked BP after 15 minutes- 180/110 manually.. On call hospitalist dr Rueben Bashkakrandy updated. New order received :BMET STAT, may give daily dose of lisinopril at 6 am, and to update him once result is back.    03/28/18 0344 03/28/18 0346 03/28/18 0414  Vitals  Temp 97.6 F (36.4 C)  --   --   Temp Source Oral  --   --   BP (!) 212/124 (!) 202/120 (!) 180/100  MAP (mmHg) 149 141  --   BP Location Right Arm Left Arm Right Arm  BP Method Automatic Automatic Manual  Patient Position (if appropriate) Sitting Sitting Lying  Pulse Rate (!) 108 97  --   Pulse Rate Source Monitor  --   --   Resp 20  --   --   Oxygen Therapy  SpO2 97 %  --   --   O2 Device Room Air  --   --

## 2018-03-28 NOTE — Evaluation (Addendum)
Occupational Therapy Evaluation Patient Details Name: Alex MedinaRonald [name] MRN: [id number] DOB: 01/23/1961 Today's Date: 03/28/2018    History of Present Illness Pt is a 57 y/o male s/p ACDF C3-5. Pt has a past medical history of DM without complication, History of kidney stones, Hypertension, PONV, and Sleep apnea.   Clinical Impression   PTA Pt independent in ADL and mobility with "intolerable pain" in RUE shooting down shoudler blade. Pt is currently mod I for ADL (supervision in the hospital with no assist needed). Session focused on compensatory strategies for ADL and handout provided in cervical biomechanics/precautions. Throughout session Pt several times attempted to "pop his neck" OT stopped him each time and he replied "I don't mean to, I just have this habit" OT feels strongly, and educated Pt (he is in agreement) that he will need to wear his cervical brace at all time to maintain and support his surgery and recovery. Education complete. Pt verbalized understanding. OT to sign off at this time. Thank you for the opportunity to serve this patient.     Follow Up Recommendations  No OT follow up;Supervision - Intermittent    Equipment Recommendations  None recommended by OT    Recommendations for Other Services       Precautions / Restrictions Precautions Precautions: Back Precaution Booklet Issued: Yes (comment) Precaution Comments: reviewed handout in full Required Braces or Orthoses: Cervical Brace Cervical Brace: Hard collar;At all times;For comfort(Pt needs for cervical precaution maintenance) Spinal Brace: Applied in sitting position Restrictions Weight Bearing Restrictions: No      Mobility Bed Mobility               General bed mobility comments: Pt in recliner at beginning and end of session - educated in log roll technique  Transfers Overall transfer level: Modified independent                    Balance Overall balance assessment: Mild deficits  observed, not formally tested                                         ADL either performed or assessed with clinical judgement   ADL Overall ADL's : Modified independent                                       General ADL Comments: Pt able to bring feet cross to knees, educated extensively in compensatory strategies for ADL including cup method - setting things up on dominant hand side etc     Vision Patient Visual Report: No change from baseline       Perception     Praxis      Pertinent Vitals/Pain Pain Assessment: 0-10 Pain Score: 3  Pain Location: neck Pain Descriptors / Indicators: Radiating;Sore Pain Intervention(s): Monitored during session     Hand Dominance Right   Extremity/Trunk Assessment Upper Extremity Assessment Upper Extremity Assessment: Overall WFL for tasks assessed(reports no pain or issues in RUE)   Lower Extremity Assessment Lower Extremity Assessment: Defer to PT evaluation   Cervical / Trunk Assessment Cervical / Trunk Assessment: Other exceptions Cervical / Trunk Exceptions: s/p cervical sx   Communication Communication Communication: No difficulties   Cognition Arousal/Alertness: Awake/alert Behavior During Therapy: WFL for tasks assessed/performed Overall Cognitive Status: Within Functional Limits for tasks  assessed                                     General Comments  Pt was trying to "pop his neck" several times throughout session - educated that Pt should NOT do this at all costs. "I'll try, its just habit"    Exercises     Shoulder Instructions      Home Living Family/patient expects to be discharged to:: Private residence Living Arrangements: Children(daughter will be staying with him) Available Help at Discharge: Family;Available PRN/intermittently               Bathroom Shower/Tub: Walk-in shower   Bathroom Toilet: Handicapped height Bathroom Accessibility: Yes How  Accessible: Accessible via wheelchair Home Equipment: Shower seat - built in;Grab bars - tub/shower;Hand held shower head          Prior Functioning/Environment Level of Independence: Independent                 OT Problem List:        OT Treatment/Interventions:      OT Goals(Current goals can be found in the care plan section) Acute Rehab OT Goals Patient Stated Goal: to get home OT Goal Formulation: With patient Time For Goal Achievement: 04/07/18 Potential to Achieve Goals: Good  OT Frequency:     Barriers to D/C:            Co-evaluation              AM-PAC PT "6 Clicks" Daily Activity     Outcome Measure Help from another person eating meals?: None Help from another person taking care of personal grooming?: None Help from another person toileting, which includes using toliet, bedpan, or urinal?: None Help from another person bathing (including washing, rinsing, drying)?: None Help from another person to put on and taking off regular upper body clothing?: None Help from another person to put on and taking off regular lower body clothing?: None 6 Click Score: 24   End of Session Equipment Utilized During Treatment: Cervical collar Nurse Communication: Mobility status;Precautions  Activity Tolerance: Patient tolerated treatment well Patient left: in chair;with call bell/phone within reach                   Time: 0825-0849 OT Time Calculation (min): 24 min Charges:  OT General Charges $OT Visit: 1 Visit OT Evaluation $OT Eval Moderate Complexity: 1 Mod  Sherryl Manges OTR/L Acute Rehabilitation Services Pager: [phone] Office: [phone]  Alex [name] 03/28/2018, 9:47 AM

## 2018-03-28 NOTE — Progress Notes (Signed)
Chaplain responded to request from pt for prayer.  Pt said he needed God to lower his blood pressure.  Offered a ministry of [employer] and prayed.   Rev. Lynnell ChadVirginia Valerio Pinard

## 2018-03-29 NOTE — Discharge Summary (Signed)
Orthopedic Discharge Summary        Physician Discharge Summary  Patient ID: Alex [name] MRN: [id number] DOB/AGE: [birth year] 57 y.o.  Admit date: 03/27/2018 Discharge date: 03/29/2018   Procedures:  Procedure(s) (LRB): ANTERIOR CERVICAL DECOMPRESSION/DISCECTOMY FUSION C3-5 (N/A)  Attending Physician:  Dr. Venita Lick  Admission Diagnoses:   Cervical pain  Discharge Diagnoses:  HNP cervical spine   Past Medical History:  Diagnosis Date  . Diabetes mellitus without complication (HCC)   . History of kidney stones    removed 5 months ago  . Hypertension   . PONV (postoperative nausea and vomiting)    AGITATION after anesthesia as well  . Sleep apnea    NO CPAP    PCP: Dettinger, Elige Radon, MD   Discharged Condition: good  Hospital Course:  Patient underwent the above stated procedure on 03/27/2018. Patient tolerated the procedure well and brought to the recovery room in good condition and subsequently to the floor. Patient had an uncomplicated hospital course and was stable for discharge.   Disposition:  with follow up in 2 weeks   Follow-up Information    Venita Lick, MD Follow up in 2 week(s).   Specialty:  Orthopedic Surgery Contact information: [address] [place] [address] [place] [address] [phone]           Discharge Instructions    Call MD / Call 911   Complete by:  As directed    If you experience chest pain or shortness of breath, CALL 911 and be transported to the hospital emergency room.  If you develope a fever above 101 F, pus (white drainage) or increased drainage or redness at the wound, or calf pain, call your surgeon's office.   Constipation Prevention   Complete by:  As directed    Drink plenty of fluids.  Prune juice may be helpful.  You may use a stool softener, such as Colace (over the counter) 100 mg twice a day.  Use MiraLax (over the counter) for constipation as needed.   Diet - low sodium heart healthy   Complete by:   As directed    Incentive spirometry RT   Complete by:  As directed    Increase activity slowly as tolerated   Complete by:  As directed       Allergies as of 03/28/2018      Reactions   Buprenorphine Hcl-naloxone Hcl Nausea Only   Codeine Nausea Only, Other (See Comments)   per pt, "felt weird in my head"      Medication List    STOP taking these medications   cyclobenzaprine 10 MG tablet Commonly known as:  FLEXERIL   diclofenac sodium 1 % Gel Commonly known as:  VOLTAREN   HYDROcodone-acetaminophen 5-325 MG tablet Commonly known as:  NORCO/VICODIN   ibuprofen 800 MG tablet Commonly known as:  ADVIL,MOTRIN   naproxen 500 MG tablet Commonly known as:  NAPROSYN   naproxen sodium 220 MG tablet Commonly known as:  ALEVE   OVER THE COUNTER MEDICATION   predniSONE 20 MG tablet Commonly known as:  DELTASONE     TAKE these medications   lisinopril 20 MG tablet Commonly known as:  PRINIVIL,ZESTRIL Take 1 tablet (20 mg total) by mouth daily.   metFORMIN 500 MG tablet Commonly known as:  GLUCOPHAGE TAKE 2 TABLETS 2 TIMES A DAY   methocarbamol 500 MG tablet Commonly known as:  ROBAXIN Take 1 tablet (500 mg total) by mouth 3 (three) times daily.   Omega  3 1000 MG Caps Take 1,000 mg by mouth daily.   ondansetron 4 MG disintegrating tablet Commonly known as:  ZOFRAN-ODT Take 1 tablet (4 mg total) by mouth every 8 (eight) hours as needed.   oxyCODONE-acetaminophen 10-325 MG tablet Commonly known as:  PERCOCET Take 1 tablet by mouth every 4 (four) hours as needed for up to 5 days for pain.   oxymetazoline 0.05 % nasal spray Commonly known as:  AFRIN Place 1 spray into both nostrils daily as needed for congestion.   pregabalin 75 MG capsule Commonly known as:  LYRICA Take 1 capsule (75 mg total) by mouth 2 (two) times daily.         Signed: Thea Gisthomas B Genie Wenke 03/29/2018, 8:47 PM  [facility] is now [facility]  [place] [address], [place], [place] [address] Phone: [phone] Facebook  Instagram  [employer]  Twitter

## 2018-03-31 ENCOUNTER — Ambulatory Visit: Payer: Medicare Other | Admitting: Family Medicine

## 2018-03-31 ENCOUNTER — Encounter: Payer: Self-pay | Admitting: Family Medicine

## 2018-03-31 VITALS — BP 141/100 | HR 101 | Temp 98.0°F | Ht 66.0 in | Wt 223.4 lb

## 2018-03-31 DIAGNOSIS — I10 Essential (primary) hypertension: Secondary | ICD-10-CM

## 2018-03-31 DIAGNOSIS — I152 Hypertension secondary to endocrine disorders: Secondary | ICD-10-CM

## 2018-03-31 DIAGNOSIS — E1159 Type 2 diabetes mellitus with other circulatory complications: Secondary | ICD-10-CM

## 2018-03-31 MED ORDER — AMLODIPINE BESYLATE 5 MG PO TABS: 5.0000 mg | ORAL_TABLET | Freq: Every day | ORAL | 3 refills | Status: DC

## 2018-03-31 NOTE — Progress Notes (Signed)
BP (!) 141/100   Pulse (!) 101   Temp 98 F (36.7 C) (Oral)   Ht '5\' 6"'  (1.676 m)   Wt 101.3 kg   BMI 36.06 kg/m    Subjective:    Patient ID: Alex [name], male    DOB: 02-02-1961, 57 y.o.   MRN: [id number]  HPI: Alex [name] is a 57 y.o. male presenting on 03/31/2018 for Hypertension (Patient states that his bp has been running high since his surgery 9/19. Patient states that after surgery they told him to take lisinopril 20 & 106m daily. Has list of bp readings.)  Patient comes in post surgery on 9/19 with high blood pressure. He said before the surgery his BP was well controlled on 20 mg of Lisinopril. His last visit on 8/16, his BP was 140/87. The patient has been taking a log of his blood pressures since surgery and they have range from 148/99 to 237/134. His personal BP cuff was compared to our BP cuff in the office and it is similar. Instead of taking his 20 mg dose of Lisinopril, he has been taking 60 mg in the morning daily. The patient seems like he is in some pain, but states he took a Percocet an hour ago and is a 5/10 for pain. He has been eating soap mostly since the surgery, due to throat soreness. He has not had any dizziness, blurry vision,weakness, chest pain, or headaches.  Relevant past medical, surgical, family and social history reviewed and updated as indicated. Interim medical history since our last visit reviewed. Allergies and medications reviewed and updated.  Review of Systems  Constitutional: Negative for chills, diaphoresis and fever.  Respiratory: Negative for cough and shortness of breath.   Cardiovascular: Negative for chest pain and palpitations.  Musculoskeletal: Positive for neck pain (from back surgery done from anterior approach).  Skin: Positive for wound (no drainage or swelling around surgical site, covered with steri strips).  Neurological: Negative for dizziness, weakness and light-headedness.    Per HPI unless specifically indicated  above   Allergies as of 03/31/2018      Reactions   Buprenorphine Hcl-naloxone Hcl Nausea Only   Codeine Nausea Only, Other (See Comments)   per pt, "felt weird in my head"      Medication List        Accurate as of 03/31/18  2:08 PM. Always use your most recent med list.          carvedilol 6.25 MG tablet Commonly known as:  COREG Take 1 tablet by mouth 2 (two) times daily.   lisinopril 40 MG tablet Commonly known as:  PRINIVIL,ZESTRIL Take 40 mg by mouth daily.   lisinopril 20 MG tablet Commonly known as:  PRINIVIL,ZESTRIL Take 1 tablet (20 mg total) by mouth daily.   metFORMIN 500 MG tablet Commonly known as:  GLUCOPHAGE TAKE 2 TABLETS 2 TIMES A DAY   methocarbamol 500 MG tablet Commonly known as:  ROBAXIN Take 1 tablet (500 mg total) by mouth 3 (three) times daily.   Omega 3 1000 MG Caps Take 1,000 mg by mouth daily.   ondansetron 4 MG disintegrating tablet Commonly known as:  ZOFRAN-ODT Take 1 tablet (4 mg total) by mouth every 8 (eight) hours as needed.   oxyCODONE-acetaminophen 10-325 MG tablet Commonly known as:  PERCOCET Take 1 tablet by mouth every 4 (four) hours as needed for up to 5 days for pain.   oxymetazoline 0.05 % nasal spray Commonly known as:  AMelissa Montane  Place 1 spray into both nostrils daily as needed for congestion.   pregabalin 75 MG capsule Commonly known as:  LYRICA Take 1 capsule (75 mg total) by mouth 2 (two) times daily.          Objective:    BP (!) 141/100   Pulse (!) 101   Temp 98 F (36.7 C) (Oral)   Ht '5\' 6"'  (1.676 m)   Wt 101.3 kg   BMI 36.06 kg/m   Wt Readings from Last 3 Encounters:  03/31/18 101.3 kg  03/27/18 98.9 kg  03/08/18 98.9 kg    Physical Exam  Constitutional: He is oriented to person, place, and time. He appears well-developed and well-nourished.  Cardiovascular: Normal rate, regular rhythm, normal heart sounds and intact distal pulses.  Pulmonary/Chest: Effort normal and breath sounds normal.   Neurological: He is alert and oriented to person, place, and time.  Skin:     Psychiatric: He has a normal mood and affect.    Results for orders placed or performed during the hospital encounter of 03/27/18  CBC  Result Value Ref Range   WBC 7.4 4.0 - 10.5 K/uL   RBC 4.43 4.22 - 5.81 MIL/uL   Hemoglobin 13.2 13.0 - 17.0 g/dL   HCT 40.0 39.0 - 52.0 %   MCV 90.3 78.0 - 100.0 fL   MCH 29.8 26.0 - 34.0 pg   MCHC 33.0 30.0 - 36.0 g/dL   RDW 14.1 11.5 - 15.5 %   Platelets 187 150 - 400 K/uL  Basic metabolic panel  Result Value Ref Range   Sodium 140 135 - 145 mmol/L   Potassium 3.9 3.5 - 5.1 mmol/L   Chloride 105 98 - 111 mmol/L   CO2 23 22 - 32 mmol/L   Glucose, Bld 102 (H) 70 - 99 mg/dL   BUN 10 6 - 20 mg/dL   Creatinine, Ser 0.77 0.61 - 1.24 mg/dL   Calcium 9.2 8.9 - 10.3 mg/dL   GFR calc non Af Amer >60 >60 mL/min   GFR calc Af Amer >60 >60 mL/min   Anion gap 12 5 - 15  Hemoglobin A1c  Result Value Ref Range   Hgb A1c MFr Bld 6.3 (H) 4.8 - 5.6 %   Mean Plasma Glucose 134.11 mg/dL  Glucose, capillary  Result Value Ref Range   Glucose-Capillary 93 70 - 99 mg/dL  Glucose, capillary  Result Value Ref Range   Glucose-Capillary 156 (H) 70 - 99 mg/dL  Glucose, capillary  Result Value Ref Range   Glucose-Capillary 186 (H) 70 - 99 mg/dL  Glucose, capillary  Result Value Ref Range   Glucose-Capillary 153 (H) 70 - 99 mg/dL  Basic metabolic panel  Result Value Ref Range   Sodium 139 135 - 145 mmol/L   Potassium 4.3 3.5 - 5.1 mmol/L   Chloride 102 98 - 111 mmol/L   CO2 27 22 - 32 mmol/L   Glucose, Bld 178 (H) 70 - 99 mg/dL   BUN 11 6 - 20 mg/dL   Creatinine, Ser 0.76 0.61 - 1.24 mg/dL   Calcium 9.1 8.9 - 10.3 mg/dL   GFR calc non Af Amer >60 >60 mL/min   GFR calc Af Amer >60 >60 mL/min   Anion gap 10 5 - 15  Glucose, capillary  Result Value Ref Range   Glucose-Capillary 168 (H) 70 - 99 mg/dL   Comment 1 Notify RN    Comment 2 Document in Chart   Glucose,  capillary  Result  Value Ref Range   Glucose-Capillary 127 (H) 70 - 99 mg/dL  Glucose, capillary  Result Value Ref Range   Glucose-Capillary 134 (H) 70 - 99 mg/dL      Assessment & Plan:   Problem List Items Addressed This Visit      Cardiovascular and Mediastinum   Hypertension associated with diabetes ([place]) - Primary   Relevant Medications   carvedilol (COREG) 6.25 MG tablet   lisinopril (PRINIVIL,ZESTRIL) 40 MG tablet   amLODipine (NORVASC) 5 MG tablet   Other Relevant Orders   BMP8+EGFR (Completed)      Hypertension Continue taking Lisinopril 40 mg and Carvedilol 6.25 mg bid. Start taking Amlodipine 5 mg daily. Increase in BP is most likely due to swelling and inflammation as well as pain from the surgery. Patient was told to go to the emergency room if he starts to have blurry vision, weakness, or trouble speaking. Follow up in 4 weeks for blood pressure check.  Follow up plan: Return in about 4 weeks (around 04/28/2018), or if symptoms worsen or fail to improve, for Hypertension recheck.  Counseling provided for all of the vaccine components No orders of the defined types were placed in this encounter.  Patient was seen and examined with Rejeana Brock, PA student, agree with assessment and plan above. Caryl Pina, MD [facility] 03/31/2018, 2:08 PM

## 2018-04-01 LAB — BMP8+EGFR
BUN/Creatinine Ratio: 17 (ref 9–20)
BUN: 14 mg/dL (ref 6–24)
CALCIUM: 9.2 mg/dL (ref 8.7–10.2)
CHLORIDE: 99 mmol/L (ref 96–106)
CO2: 25 mmol/L (ref 20–29)
Creatinine, Ser: 0.81 mg/dL (ref 0.76–1.27)
GFR calc non Af Amer: 99 mL/min/{1.73_m2} (ref >59)
GFR, EST AFRICAN AMERICAN: 114 mL/min/{1.73_m2} (ref >59)
Glucose: 123 mg/dL — ABNORMAL HIGH (ref 65–99)
Potassium: 4 mmol/L (ref 3.5–5.2)
Sodium: 141 mmol/L (ref 134–144)

## 2018-04-03 ENCOUNTER — Ambulatory Visit: Payer: Medicare Other | Admitting: Family Medicine

## 2018-04-15 ENCOUNTER — Telehealth: Payer: Self-pay | Admitting: Family Medicine

## 2018-04-15 ENCOUNTER — Other Ambulatory Visit (HOSPITAL_COMMUNITY): Payer: Medicare Other

## 2018-04-15 NOTE — Telephone Encounter (Signed)
Can this be answered or must wait for PCP?

## 2018-04-16 MED ORDER — LISINOPRIL 40 MG PO TABS: 40.0000 mg | ORAL_TABLET | Freq: Every day | ORAL | 1 refills | Status: DC

## 2018-04-16 NOTE — Telephone Encounter (Signed)
40mg  sent to [facility]

## 2018-04-16 NOTE — Telephone Encounter (Signed)
He should be taking the 40 mg

## 2018-05-16 ENCOUNTER — Other Ambulatory Visit: Payer: Self-pay | Admitting: Family Medicine

## 2018-05-28 ENCOUNTER — Encounter: Payer: Self-pay | Admitting: Family Medicine

## 2018-05-28 ENCOUNTER — Ambulatory Visit (INDEPENDENT_AMBULATORY_CARE_PROVIDER_SITE_OTHER): Payer: Medicare Other | Admitting: Family Medicine

## 2018-05-28 VITALS — BP 193/95 | HR 56 | Temp 97.6°F | Ht 66.0 in | Wt 220.8 lb

## 2018-05-28 DIAGNOSIS — I160 Hypertensive urgency: Secondary | ICD-10-CM

## 2018-05-28 DIAGNOSIS — Z23 Encounter for immunization: Secondary | ICD-10-CM

## 2018-05-28 DIAGNOSIS — E1169 Type 2 diabetes mellitus with other specified complication: Secondary | ICD-10-CM | POA: Diagnosis not present

## 2018-05-28 DIAGNOSIS — I10 Essential (primary) hypertension: Secondary | ICD-10-CM

## 2018-05-28 DIAGNOSIS — E1159 Type 2 diabetes mellitus with other circulatory complications: Secondary | ICD-10-CM

## 2018-05-28 DIAGNOSIS — I152 Hypertension secondary to endocrine disorders: Secondary | ICD-10-CM

## 2018-05-28 DIAGNOSIS — E669 Obesity, unspecified: Secondary | ICD-10-CM

## 2018-05-28 LAB — CBC WITH DIFFERENTIAL/PLATELET
BASOS ABS: 0 10*3/uL (ref 0.0–0.2)
BASOS: 1 %
EOS (ABSOLUTE): 0.1 10*3/uL (ref 0.0–0.4)
Eos: 2 %
Hematocrit: 38.2 % (ref 37.5–51.0)
Hemoglobin: 13 g/dL (ref 13.0–17.7)
IMMATURE GRANULOCYTES: 0 %
Immature Grans (Abs): 0 10*3/uL (ref 0.0–0.1)
Lymphocytes Absolute: 2.2 10*3/uL (ref 0.7–3.1)
Lymphs: 35 %
MCH: 29.2 pg (ref 26.6–33.0)
MCHC: 34 g/dL (ref 31.5–35.7)
MCV: 86 fL (ref 79–97)
Monocytes Absolute: 0.4 10*3/uL (ref 0.1–0.9)
Monocytes: 7 %
NEUTROS PCT: 55 %
Neutrophils Absolute: 3.5 10*3/uL (ref 1.4–7.0)
PLATELETS: 192 10*3/uL (ref 150–450)
RBC: 4.45 x10E6/uL (ref 4.14–5.80)
RDW: 13.1 % (ref 12.3–15.4)
WBC: 6.3 10*3/uL (ref 3.4–10.8)

## 2018-05-28 LAB — LIPID PANEL
Chol/HDL Ratio: 2.9 ratio (ref 0.0–5.0)
Cholesterol, Total: 149 mg/dL (ref 100–199)
HDL: 51 mg/dL
LDL Calculated: 83 mg/dL (ref 0–99)
Triglycerides: 75 mg/dL (ref 0–149)
VLDL Cholesterol Cal: 15 mg/dL (ref 5–40)

## 2018-05-28 LAB — CMP14+EGFR
ALT: 21 IU/L (ref 0–44)
AST: 16 IU/L (ref 0–40)
Albumin/Globulin Ratio: 2.6 — ABNORMAL HIGH (ref 1.2–2.2)
Albumin: 4.6 g/dL (ref 3.5–5.5)
Alkaline Phosphatase: 75 IU/L (ref 39–117)
BUN/Creatinine Ratio: 16 (ref 9–20)
BUN: 12 mg/dL (ref 6–24)
Bilirubin Total: 0.3 mg/dL (ref 0.0–1.2)
CO2: 21 mmol/L (ref 20–29)
Calcium: 9.3 mg/dL (ref 8.7–10.2)
Chloride: 104 mmol/L (ref 96–106)
Creatinine, Ser: 0.76 mg/dL (ref 0.76–1.27)
GFR calc Af Amer: 117 mL/min/1.73
GFR calc non Af Amer: 101 mL/min/1.73
Globulin, Total: 1.8 g/dL (ref 1.5–4.5)
Glucose: 92 mg/dL (ref 65–99)
Potassium: 4.2 mmol/L (ref 3.5–5.2)
Sodium: 140 mmol/L (ref 134–144)
Total Protein: 6.4 g/dL (ref 6.0–8.5)

## 2018-05-28 LAB — BAYER DCA HB A1C WAIVED: HB A1C (BAYER DCA - WAIVED): 5.9 %

## 2018-05-28 MED ORDER — AMLODIPINE BESYLATE 5 MG PO TABS: 5.0000 mg | ORAL_TABLET | Freq: Every day | ORAL | 3 refills | Status: DC

## 2018-05-28 NOTE — Progress Notes (Signed)
BP (!) 193/95   Pulse (!) 56   Temp 97.6 F (36.4 C) (Oral)   Ht _0  (1.676 m)   Wt 220 lb 12.8 oz (100.2 kg)   BMI 35.64 kg/m    Subjective:    Patient ID: Alex [name], male    DOB: 12-29-1960, 57 y.o.   MRN: [id number]  HPI: Alex [name] is a 57 y.o. male presenting on 05/28/2018 for Diabetes (3 month follow up) and Hypertension   HPI Hypertension Patient is currently on lisinopril and carvedilol, had stopped amlodipine and blood pressure is up, will restart amlodipine, and their blood pressure today is 193/95. Patient denies any lightheadedness or dizziness. Patient denies headaches, blurred vision, chest pains, shortness of breath, or weakness. Denies any side effects from medication and is content with current medication.   Type 2 diabetes mellitus Patient comes in today for recheck of his diabetes. Patient has been currently taking metformin Lyrica for neuropathy. Patient is currently on an ACE inhibitor/ARB. Patient has not seen an ophthalmologist this year. Patient denies any new issues with his feet but he does have known neuropathy.  Gave education on weight and increasing activity as permitted by his neurosurgeon after the surgery.  Relevant past medical, surgical, family and social history reviewed and updated as indicated. Interim medical history since our last visit reviewed. Allergies and medications reviewed and updated.  Review of Systems  Constitutional: Negative for chills and fever.  Eyes: Negative for visual disturbance.  Respiratory: Negative for shortness of breath and wheezing.   Cardiovascular: Negative for chest pain and leg swelling.  Musculoskeletal: Negative for back pain and gait problem.  Skin: Negative for rash.  Neurological: Negative for dizziness, weakness, light-headedness and numbness.  All other systems reviewed and are negative.   Per HPI unless specifically indicated above   Allergies as of 05/28/2018      Reactions   Buprenorphine Hcl-naloxone Hcl Nausea Only   Codeine Nausea Only, Other (See Comments)   per pt, "felt weird in my head"      Medication List        Accurate as of 05/28/18  8:40 AM. Always use your most recent med list.          amLODipine 5 MG tablet Commonly known as:  NORVASC Take 1 tablet (5 mg total) by mouth daily.   carvedilol 6.25 MG tablet Commonly known as:  COREG Take 1 tablet by mouth 2 (two) times daily.   lisinopril 40 MG tablet Commonly known as:  PRINIVIL,ZESTRIL Take 1 tablet (40 mg total) by mouth daily.   metFORMIN 500 MG tablet Commonly known as:  GLUCOPHAGE TAKE 2 TABLET BY MOUTH TWICE A DAY   methocarbamol 500 MG tablet Commonly known as:  ROBAXIN Take 1 tablet (500 mg total) by mouth 3 (three) times daily.   Omega 3 1000 MG Caps Take 1,000 mg by mouth daily.   ondansetron 4 MG disintegrating tablet Commonly known as:  ZOFRAN-ODT Take 1 tablet (4 mg total) by mouth every 8 (eight) hours as needed.   oxymetazoline 0.05 % nasal spray Commonly known as:  AFRIN Place 1 spray into both nostrils daily as needed for congestion.   pregabalin 75 MG capsule Commonly known as:  LYRICA Take 1 capsule (75 mg total) by mouth 2 (two) times daily.          Objective:    BP (!) 193/95   Pulse (!) 56   Temp 97.6 F (36.4 C) (Oral)  Ht _0  (1.676 m)   Wt 220 lb 12.8 oz (100.2 kg)   BMI 35.64 kg/m   Wt Readings from Last 3 Encounters:  05/28/18 220 lb 12.8 oz (100.2 kg)  03/31/18 223 lb 6.4 oz (101.3 kg)  03/27/18 218 lb (98.9 kg)    Physical Exam  Constitutional: He is oriented to person, place, and time. He appears well-developed and well-nourished. No distress.  Eyes: Pupils are equal, round, and reactive to light. Conjunctivae and EOM are normal. No scleral icterus.  Neck: Neck supple. No thyromegaly present.  Cardiovascular: Normal rate, regular rhythm, normal heart sounds and intact distal pulses.  No murmur heard. Pulmonary/Chest:  Effort normal and breath sounds normal. No respiratory distress. He has no wheezes.  Musculoskeletal: Normal range of motion. He exhibits no edema.  Lymphadenopathy:    He has no cervical adenopathy.  Neurological: He is alert and oriented to person, place, and time. Coordination normal.  Skin: Skin is warm and dry. No rash noted. He is not diaphoretic.  Psychiatric: He has a normal mood and affect. His behavior is normal.  Nursing note and vitals reviewed.       Assessment & Plan:   Problem List Items Addressed This Visit      Cardiovascular and Mediastinum   Hypertension associated with diabetes ([place])   Relevant Medications   amLODipine (NORVASC) 5 MG tablet   Other Relevant Orders   CMP14+EGFR   Hypertensive urgency   Relevant Medications   amLODipine (NORVASC) 5 MG tablet   Other Relevant Orders   CBC with Differential/Platelet   CMP14+EGFR     Endocrine   Diabetes mellitus type 2 in obese ([place]) - Primary   Relevant Orders   Bayer DCA Hb A1c Waived     Other   Morbid obesity ([place])   Relevant Orders   Lipid panel     Will restart amlodipine and have him call back in 2 weeks with some blood pressures, continue other medication.  Follow up plan: Return in about 3 months (around 08/28/2018), or if symptoms worsen or fail to improve, for Hypertension and diabetes, I want him to call back some numbers for blood pressure in a week or 2.  Counseling provided for all of the vaccine components No orders of the defined types were placed in this encounter.   Caryl Pina, MD [facility] 05/28/2018, 8:40 AM

## 2018-06-10 ENCOUNTER — Telehealth: Payer: Self-pay | Admitting: Family Medicine

## 2018-06-10 NOTE — Telephone Encounter (Signed)
I would think it would be a good idea for patient to be on a statin if he is agreeable towards it, I think he has been not agreeable towards it in the past but if he is agreeable towards it we would start a statin not because his cholesterol is off but because he has risk factors for heart disease.  Start him on pravastatin 10 mg nightly and give him a years worth.

## 2018-06-11 NOTE — Telephone Encounter (Signed)
lmtcb

## 2018-06-19 NOTE — Telephone Encounter (Signed)
Attempted to contact pt without return call in over 3 days will close encounter.

## 2018-06-24 ENCOUNTER — Other Ambulatory Visit: Payer: Self-pay | Admitting: Family Medicine

## 2018-06-25 MED ORDER — DICLOFENAC SODIUM 1 % TD GEL: 2.0000 g | Freq: Four times a day (QID) | TRANSDERMAL | 2 refills | Status: DC

## 2018-06-25 NOTE — Telephone Encounter (Signed)
I sent Voltaren for him

## 2018-06-25 NOTE — Telephone Encounter (Signed)
TC from pt requesting RF on Voltaren 1% gel.  Not on current med list. D/C'd at discharge 03/28/18 Last OV 05/28/18 Please Advise. [facility]

## 2018-06-25 NOTE — Telephone Encounter (Signed)
Pt aware.

## 2018-07-22 DIAGNOSIS — R6889 Other general symptoms and signs: Secondary | ICD-10-CM | POA: Diagnosis not present

## 2018-07-22 DIAGNOSIS — R0781 Pleurodynia: Secondary | ICD-10-CM | POA: Diagnosis not present

## 2018-07-22 DIAGNOSIS — M542 Cervicalgia: Secondary | ICD-10-CM | POA: Diagnosis not present

## 2018-07-22 DIAGNOSIS — M5030 Other cervical disc degeneration, unspecified cervical region: Secondary | ICD-10-CM | POA: Diagnosis not present

## 2018-07-29 DIAGNOSIS — M6530 Trigger finger, unspecified finger: Secondary | ICD-10-CM | POA: Diagnosis not present

## 2018-07-29 DIAGNOSIS — M5412 Radiculopathy, cervical region: Secondary | ICD-10-CM | POA: Diagnosis not present

## 2018-08-07 DIAGNOSIS — M5030 Other cervical disc degeneration, unspecified cervical region: Secondary | ICD-10-CM | POA: Diagnosis not present

## 2018-08-07 DIAGNOSIS — R6889 Other general symptoms and signs: Secondary | ICD-10-CM | POA: Diagnosis not present

## 2018-08-11 ENCOUNTER — Telehealth: Payer: Self-pay | Admitting: Family Medicine

## 2018-08-11 MED ORDER — DICLOFENAC SODIUM 75 MG PO TBEC: 75.0000 mg | DELAYED_RELEASE_TABLET | Freq: Two times a day (BID) | ORAL | 0 refills | Status: DC

## 2018-08-11 NOTE — Telephone Encounter (Signed)
Attempted to contact patient - NVM 

## 2018-08-11 NOTE — Telephone Encounter (Signed)
Prescription sent to pharmacy. He can continue to use gel with the tablets.

## 2018-08-11 NOTE — Telephone Encounter (Signed)
Patient aware and verbalizes understanding. 

## 2018-08-22 DIAGNOSIS — M65332 Trigger finger, left middle finger: Secondary | ICD-10-CM | POA: Diagnosis not present

## 2018-08-22 DIAGNOSIS — R6889 Other general symptoms and signs: Secondary | ICD-10-CM | POA: Diagnosis not present

## 2018-08-22 DIAGNOSIS — M79642 Pain in left hand: Secondary | ICD-10-CM | POA: Diagnosis not present

## 2018-08-28 ENCOUNTER — Ambulatory Visit: Payer: Medicare Other | Admitting: Family Medicine

## 2018-09-09 ENCOUNTER — Other Ambulatory Visit: Payer: Self-pay | Admitting: Family

## 2018-09-10 ENCOUNTER — Other Ambulatory Visit: Payer: Self-pay | Admitting: Family

## 2018-09-11 ENCOUNTER — Ambulatory Visit: Payer: Self-pay | Admitting: Family Medicine

## 2018-09-26 ENCOUNTER — Ambulatory Visit (INDEPENDENT_AMBULATORY_CARE_PROVIDER_SITE_OTHER): Payer: Medicare Other | Admitting: Family Medicine

## 2018-09-26 ENCOUNTER — Other Ambulatory Visit: Payer: Self-pay

## 2018-09-26 ENCOUNTER — Encounter: Payer: Self-pay | Admitting: Family Medicine

## 2018-09-26 VITALS — BP 126/78 | HR 86 | Temp 97.6°F | Ht 66.0 in | Wt 220.6 lb

## 2018-09-26 DIAGNOSIS — E1159 Type 2 diabetes mellitus with other circulatory complications: Secondary | ICD-10-CM

## 2018-09-26 DIAGNOSIS — E1142 Type 2 diabetes mellitus with diabetic polyneuropathy: Secondary | ICD-10-CM | POA: Diagnosis not present

## 2018-09-26 DIAGNOSIS — I152 Hypertension secondary to endocrine disorders: Secondary | ICD-10-CM

## 2018-09-26 DIAGNOSIS — I10 Essential (primary) hypertension: Secondary | ICD-10-CM | POA: Diagnosis not present

## 2018-09-26 DIAGNOSIS — I160 Hypertensive urgency: Secondary | ICD-10-CM | POA: Diagnosis not present

## 2018-09-26 LAB — CBC WITH DIFFERENTIAL/PLATELET
BASOS ABS: 0 10*3/uL (ref 0.0–0.2)
Basos: 0 %
EOS (ABSOLUTE): 0.2 10*3/uL (ref 0.0–0.4)
Eos: 2 %
HEMOGLOBIN: 13 g/dL (ref 13.0–17.7)
Hematocrit: 37.2 % — ABNORMAL LOW (ref 37.5–51.0)
Immature Grans (Abs): 0 10*3/uL (ref 0.0–0.1)
Immature Granulocytes: 0 %
Lymphocytes Absolute: 3.4 10*3/uL — ABNORMAL HIGH (ref 0.7–3.1)
Lymphs: 37 %
MCH: 29.1 pg (ref 26.6–33.0)
MCHC: 34.9 g/dL (ref 31.5–35.7)
MCV: 83 fL (ref 79–97)
MONOCYTES: 7 %
Monocytes Absolute: 0.6 10*3/uL (ref 0.1–0.9)
Neutrophils Absolute: 4.8 10*3/uL (ref 1.4–7.0)
Neutrophils: 54 %
Platelets: 244 10*3/uL (ref 150–450)
RBC: 4.47 x10E6/uL (ref 4.14–5.80)
RDW: 13.6 % (ref 11.6–15.4)
WBC: 9 10*3/uL (ref 3.4–10.8)

## 2018-09-26 LAB — LIPID PANEL
CHOLESTEROL TOTAL: 164 mg/dL (ref 100–199)
Chol/HDL Ratio: 3.4 ratio (ref 0.0–5.0)
HDL: 48 mg/dL (ref >39)
LDL Calculated: 93 mg/dL (ref 0–99)
TRIGLYCERIDES: 117 mg/dL (ref 0–149)
VLDL Cholesterol Cal: 23 mg/dL (ref 5–40)

## 2018-09-26 LAB — CMP14+EGFR
A/G RATIO: 1.8 (ref 1.2–2.2)
ALT: 24 IU/L (ref 0–44)
AST: 22 IU/L (ref 0–40)
Albumin: 4.3 g/dL (ref 3.8–4.9)
Alkaline Phosphatase: 72 IU/L (ref 39–117)
BUN/Creatinine Ratio: 18 (ref 9–20)
BUN: 15 mg/dL (ref 6–24)
Bilirubin Total: 0.4 mg/dL (ref 0.0–1.2)
CALCIUM: 9.4 mg/dL (ref 8.7–10.2)
CO2: 21 mmol/L (ref 20–29)
Chloride: 106 mmol/L (ref 96–106)
Creatinine, Ser: 0.82 mg/dL (ref 0.76–1.27)
GFR calc Af Amer: 113 mL/min/{1.73_m2} (ref >59)
GFR calc non Af Amer: 98 mL/min/{1.73_m2} (ref >59)
Globulin, Total: 2.4 g/dL (ref 1.5–4.5)
Glucose: 85 mg/dL (ref 65–99)
Potassium: 4.6 mmol/L (ref 3.5–5.2)
Sodium: 142 mmol/L (ref 134–144)
Total Protein: 6.7 g/dL (ref 6.0–8.5)

## 2018-09-26 LAB — BAYER DCA HB A1C WAIVED: HB A1C (BAYER DCA - WAIVED): 5.9 % (ref <7.0)

## 2018-09-26 MED ORDER — LISINOPRIL 40 MG PO TABS: 40.0000 mg | ORAL_TABLET | Freq: Every day | ORAL | 3 refills | Status: DC

## 2018-09-26 MED ORDER — METFORMIN HCL 500 MG PO TABS: ORAL_TABLET | ORAL | 3 refills | Status: DC

## 2018-09-26 MED ORDER — PREGABALIN 75 MG PO CAPS: 75.0000 mg | ORAL_CAPSULE | Freq: Two times a day (BID) | ORAL | 5 refills | Status: DC

## 2018-09-26 MED ORDER — CARVEDILOL 6.25 MG PO TABS: 6.2500 mg | ORAL_TABLET | Freq: Two times a day (BID) | ORAL | 3 refills | Status: DC

## 2018-09-26 MED ORDER — AMLODIPINE BESYLATE 5 MG PO TABS: 5.0000 mg | ORAL_TABLET | Freq: Every day | ORAL | 3 refills | Status: DC

## 2018-09-26 NOTE — Progress Notes (Signed)
BP 126/78   Pulse 86   Temp 97.6 F (36.4 C) (Oral)   Ht '5\' 6"'  (1.676 m)   Wt 220 lb 9.6 oz (100.1 kg)   BMI 35.61 kg/m    Subjective:   Patient ID: Alex [name], male    DOB: [birth year], 58 y.o.   MRN: [id number]  HPI: Alex [name] is a 58 y.o. male presenting on 09/26/2018 for Diabetes (3 month follow up) and Hypertension   HPI Type 2 diabetes mellitus Patient comes in today for recheck of his diabetes. Patient has been currently taking metformin and his blood sugars have been under good control. Patient is currently on an ACE inhibitor/ARB. Patient has not seen an ophthalmologist this year.  Patient has neuropathy with feet.  Patient takes Lyrica for neuropathy and needs refill for this.  Hypertension Patient is currently on lisinopril and carvedilol and amlodipine, and their blood pressure today is 126/78. Patient denies any lightheadedness or dizziness. Patient denies headaches, blurred vision, chest pains, shortness of breath, or weakness. Denies any side effects from medication and is content with current medication.   Discussed weight loss and dietary management options with patient and he will try and redirect  Relevant past medical, surgical, family and social history reviewed and updated as indicated. Interim medical history since our last visit reviewed. Allergies and medications reviewed and updated.  Review of Systems  Constitutional: Negative for chills and fever.  Eyes: Negative for visual disturbance.  Respiratory: Negative for shortness of breath and wheezing.   Cardiovascular: Negative for chest pain and leg swelling.  Musculoskeletal: Negative for back pain and gait problem.  Skin: Negative for rash.  Neurological: Negative for dizziness, weakness, light-headedness and numbness.  All other systems reviewed and are negative.   Per HPI unless specifically indicated above   Allergies as of 09/26/2018      Reactions   Buprenorphine Hcl-naloxone Hcl  Nausea Only   Codeine Nausea Only, Other (See Comments)   per pt, "felt weird in my head"      Medication List       Accurate as of September 26, 2018 11:59 PM. Always use your most recent med list.        amLODipine 5 MG tablet Commonly known as:  NORVASC Take 1 tablet (5 mg total) by mouth daily.   carvedilol 6.25 MG tablet Commonly known as:  COREG Take 1 tablet (6.25 mg total) by mouth 2 (two) times daily.   diclofenac 75 MG EC tablet Commonly known as:  VOLTAREN TAKE  (1)  TABLET TWICE A DAY.   diclofenac sodium 1 % Gel Commonly known as:  Voltaren Apply 2 g topically 4 (four) times daily.   lisinopril 40 MG tablet Commonly known as:  PRINIVIL,ZESTRIL Take 1 tablet (40 mg total) by mouth daily.   metFORMIN 500 MG tablet Commonly known as:  GLUCOPHAGE TAKE 2 TABLET BY MOUTH TWICE A DAY   methocarbamol 500 MG tablet Commonly known as:  Robaxin Take 1 tablet (500 mg total) by mouth 3 (three) times daily.   Omega 3 1000 MG Caps Take 1,000 mg by mouth daily.   ondansetron 4 MG disintegrating tablet Commonly known as:  Zofran ODT Take 1 tablet (4 mg total) by mouth every 8 (eight) hours as needed.   oxymetazoline 0.05 % nasal spray Commonly known as:  AFRIN Place 1 spray into both nostrils daily as needed for congestion.   pregabalin 75 MG capsule Commonly known as:  LYRICA Take 1  capsule (75 mg total) by mouth 2 (two) times daily.        Objective:   BP 126/78   Pulse 86   Temp 97.6 F (36.4 C) (Oral)   Ht '5\' 6"'  (1.676 m)   Wt 220 lb 9.6 oz (100.1 kg)   BMI 35.61 kg/m   Wt Readings from Last 3 Encounters:  09/26/18 220 lb 9.6 oz (100.1 kg)  05/28/18 220 lb 12.8 oz (100.2 kg)  03/31/18 223 lb 6.4 oz (101.3 kg)    Physical Exam Vitals signs and nursing note reviewed.  Constitutional:      General: He is not in acute distress.    Appearance: He is well-developed. He is not diaphoretic.  Eyes:     General: No scleral icterus.     Conjunctiva/sclera: Conjunctivae normal.  Neck:     Musculoskeletal: Neck supple.     Thyroid: No thyromegaly.  Cardiovascular:     Rate and Rhythm: Normal rate and regular rhythm.     Heart sounds: Normal heart sounds. No murmur.  Pulmonary:     Effort: Pulmonary effort is normal. No respiratory distress.     Breath sounds: Normal breath sounds. No wheezing.  Musculoskeletal: Normal range of motion.  Lymphadenopathy:     Cervical: No cervical adenopathy.  Skin:    General: Skin is warm and dry.     Findings: No rash.  Neurological:     Mental Status: He is alert and oriented to person, place, and time.     Coordination: Coordination normal.  Psychiatric:        Behavior: Behavior normal.     Assessment & Plan:   Problem List Items Addressed This Visit      Cardiovascular and Mediastinum   Hypertension associated with diabetes ([place])   Relevant Medications   amLODipine (NORVASC) 5 MG tablet   lisinopril (PRINIVIL,ZESTRIL) 40 MG tablet   metFORMIN (GLUCOPHAGE) 500 MG tablet   carvedilol (COREG) 6.25 MG tablet   Other Relevant Orders   CBC with Differential/Platelet (Completed)   Lipid panel (Completed)     Other   Morbid obesity ([place])   Relevant Medications   metFORMIN (GLUCOPHAGE) 500 MG tablet    Other Visit Diagnoses    Type 2 diabetes mellitus with diabetic polyneuropathy, without long-term current use of insulin (HCC)    -  Primary   Relevant Medications   pregabalin (LYRICA) 75 MG capsule   lisinopril (PRINIVIL,ZESTRIL) 40 MG tablet   metFORMIN (GLUCOPHAGE) 500 MG tablet   Other Relevant Orders   Bayer DCA Hb A1c Waived (Completed)   CMP14+EGFR (Completed)   CBC with Differential/Platelet (Completed)    Continue current medications of Lyrica will switch to Lyrica from gabapentin.  Follow up plan: Return in about 3 months (around 12/27/2018), or if symptoms worsen or fail to improve, for Type 2 diabetes and hypertension.  Counseling provided for all of  the vaccine components Orders Placed This Encounter  Procedures  . Bayer DCA Hb A1c Waived  . CMP14+EGFR  . CBC with Differential/Platelet  . Lipid panel    Caryl Pina, MD [facility] 09/29/2018, 6:33 PM

## 2018-10-31 ENCOUNTER — Other Ambulatory Visit: Payer: Self-pay | Admitting: Family Medicine

## 2018-11-10 ENCOUNTER — Telehealth: Payer: Self-pay | Admitting: Family Medicine

## 2018-11-10 NOTE — Telephone Encounter (Signed)
Yes he can but also an antidandruff shampoo can help with this as well.

## 2018-11-10 NOTE — Telephone Encounter (Signed)
Patient aware of recommendations.  

## 2018-11-18 ENCOUNTER — Ambulatory Visit (INDEPENDENT_AMBULATORY_CARE_PROVIDER_SITE_OTHER): Payer: Medicare Other | Admitting: Family Medicine

## 2018-11-18 ENCOUNTER — Ambulatory Visit (HOSPITAL_COMMUNITY): Admission: RE | Admit: 2018-11-18 | Payer: Medicare Other | Source: Ambulatory Visit

## 2018-11-18 ENCOUNTER — Encounter: Payer: Self-pay | Admitting: Family Medicine

## 2018-11-18 ENCOUNTER — Other Ambulatory Visit: Payer: Self-pay

## 2018-11-18 DIAGNOSIS — M25561 Pain in right knee: Secondary | ICD-10-CM | POA: Diagnosis not present

## 2018-11-18 DIAGNOSIS — M7121 Synovial cyst of popliteal space [Baker], right knee: Secondary | ICD-10-CM

## 2018-11-18 DIAGNOSIS — L219 Seborrheic dermatitis, unspecified: Secondary | ICD-10-CM

## 2018-11-18 MED ORDER — KETOCONAZOLE 2 % EX SHAM: 1.0000 "application " | MEDICATED_SHAMPOO | CUTANEOUS | 0 refills | Status: DC

## 2018-11-18 NOTE — Progress Notes (Signed)
Virtual Visit via telephone Note Due to COVID-19, visit is conducted virtually and was requested by patient. This visit type was conducted due to national recommendations for restrictions regarding the COVID-19 Pandemic (e.g. social distancing) in an effort to limit this patient's exposure and mitigate transmission in our community. All issues noted in this document were discussed and addressed.  A physical exam was not performed with this format.   I connected with Alex [name] on 11/18/18 at 0845 by telephone and verified that I am speaking with the correct person using two identifiers. Keidan Wassom is currently located at home and no one is currently with them during visit. The provider, Kari Baars, FNP is located in their office at time of visit.  I discussed the limitations, risks, security and privacy concerns of performing an evaluation and management service by telephone and the availability of in person appointments. I also discussed with the patient that there may be a patient responsible charge related to this service. The patient expressed understanding and agreed to proceed.  Subjective:  Patient ID: Alex [name], male    DOB: 03-Aug-1960, 58 y.o.   MRN: [id number]  Chief Complaint:  Hair/Scalp Problem and Knee Pain   HPI: Alex [name] is a 58 y.o. male presenting on 11/18/2018 for Hair/Scalp Problem and Knee Pain   Pt reports ongoing scalp itching and dryness. States this has been ongoing for several weeks and over the counter dandruff shampoos and athletes foot cream are not working. Pt also reports pain behind his right knee. States he has a knot behind his knee near the top of his calf muscle. Pt states this has been intermittent for a while but has recently become worse. States she pain will shoot into his upper leg at times. States it hurts to walk and extend his knee due to the discomfort. Knee replacement several years ago in [place]. Has been using diclofenac  ointment and oral form without complete relief of symptoms.   Rash  This is a recurrent problem. The current episode started more than 1 month ago. The problem has been gradually worsening since onset. The affected locations include the scalp. The rash is characterized by itchiness, dryness and scaling. He was exposed to nothing. Pertinent negatives include no anorexia, congestion, cough, diarrhea, eye pain, facial edema, fatigue, fever, joint pain, nail changes, rhinorrhea, shortness of breath, sore throat or vomiting. Past treatments include anti-itch cream (dandruff shampoo, antifungal creams). The treatment provided mild relief.  Knee Pain   The incident occurred more than 1 week ago. There was no injury mechanism. The pain is present in the right knee, right thigh and right leg. The quality of the pain is described as shooting, aching and burning. The pain is at a severity of 6/10. The pain is moderate. The pain has been intermittent since onset. Pertinent negatives include no inability to bear weight, loss of motion, loss of sensation, muscle weakness, numbness or tingling. The symptoms are aggravated by movement, weight bearing and palpation. He has tried NSAIDs for the symptoms. The treatment provided mild relief.     Relevant past medical, surgical, family, and social history reviewed and updated as indicated.  Allergies and medications reviewed and updated.   Past Medical History:  Diagnosis Date  . Diabetes mellitus without complication (HCC)   . History of kidney stones    removed 5 months ago  . Hypertension   . PONV (postoperative nausea and vomiting)    AGITATION after anesthesia as well  .  Sleep apnea    NO CPAP    Past Surgical History:  Procedure Laterality Date  . ANTERIOR CERVICAL DECOMP/DISCECTOMY FUSION N/A 03/27/2018   Procedure: ANTERIOR CERVICAL DECOMPRESSION/DISCECTOMY FUSION C3-5;  Surgeon: Venita LickBrooks, Dahari, MD;  Location[facility] OR;  Service: Orthopedics;  Laterality:  N/A;  3.5 hrs  . arm surgery Right    x2 involving tendons/ulnar nerve  . BACK SURGERY    . CARPAL TUNNEL RELEASE Right   . clavical surgery    . EYE SURGERY Bilateral    glaucoma  . HERNIA REPAIR    . JOINT REPLACEMENT Right 06/2011  . TRICEPS TENDON REPAIR      Social History   Socioeconomic History  . Marital status: Married    Spouse name: Not on file  . Number of children: Not on file  . Years of education: Not on file  . Highest education level: Not on file  Occupational History  . Not on file  Social Needs  . Financial resource strain: Not on file  . Food insecurity:    Worry: Not on file    Inability: Not on file  . Transportation needs:    Medical: Not on file    Non-medical: Not on file  Tobacco Use  . Smoking status: Never Smoker  . Smokeless tobacco: Current User    Types: Chew  Substance and Sexual Activity  . Alcohol use: No  . Drug use: No  . Sexual activity: Not on file  Lifestyle  . Physical activity:    Days per week: Not on file    Minutes per session: Not on file  . Stress: Not on file  Relationships  . Social connections:    Talks on phone: Not on file    Gets together: Not on file    Attends religious service: Not on file    Active member of club or organization: Not on file    Attends meetings of clubs or organizations: Not on file    Relationship status: Not on file  . Intimate partner violence:    Fear of current or ex partner: Not on file    Emotionally abused: Not on file    Physically abused: Not on file    Forced sexual activity: Not on file  Other Topics Concern  . Not on file  Social History Narrative  . Not on file    Outpatient Encounter Medications as of 11/18/2018  Medication Sig  . amLODipine (NORVASC) 5 MG tablet Take 1 tablet (5 mg total) by mouth daily.  . carvedilol (COREG) 6.25 MG tablet Take 1 tablet (6.25 mg total) by mouth 2 (two) times daily.  . diclofenac (VOLTAREN) 75 MG EC tablet TAKE (1) TABLET TWICE A  DAY.  Marland Kitchen. diclofenac sodium (VOLTAREN) 1 % GEL Apply 2 g topically 4 (four) times daily.  Melene Muller. [START ON 11/20/2018] ketoconazole (NIZORAL) 2 % shampoo Apply 1 application topically 2 (two) times a week.  Marland Kitchen. lisinopril (PRINIVIL,ZESTRIL) 40 MG tablet Take 1 tablet (40 mg total) by mouth daily.  . metFORMIN (GLUCOPHAGE) 500 MG tablet TAKE 2 TABLET BY MOUTH TWICE A DAY  . methocarbamol (ROBAXIN) 500 MG tablet Take 1 tablet (500 mg total) by mouth 3 (three) times daily.  . Omega 3 1000 MG CAPS Take 1,000 mg by mouth daily.   . ondansetron (ZOFRAN ODT) 4 MG disintegrating tablet Take 1 tablet (4 mg total) by mouth every 8 (eight) hours as needed.  Marland Kitchen. oxymetazoline (AFRIN) 0.05 % nasal spray  Place 1 spray into both nostrils daily as needed for congestion.  . pregabalin (LYRICA) 75 MG capsule Take 1 capsule (75 mg total) by mouth 2 (two) times daily.   No facility-administered encounter medications on file as of 11/18/2018.     Allergies  Allergen Reactions  . Buprenorphine Hcl-Naloxone Hcl Nausea Only  . Codeine Nausea Only and Other (See Comments)    per pt, "felt weird in my head"    Review of Systems  Constitutional: Negative for appetite change, chills, fatigue and fever.  HENT: Negative for congestion, rhinorrhea and sore throat.   Eyes: Negative for pain.  Respiratory: Negative for cough, chest tightness, shortness of breath and wheezing.   Cardiovascular: Negative for chest pain, palpitations and leg swelling.  Gastrointestinal: Negative for abdominal distention, abdominal pain, anorexia, diarrhea and vomiting.  Musculoskeletal: Positive for arthralgias, joint swelling (right knee) and myalgias (right calf). Negative for joint pain.  Skin: Positive for rash. Negative for color change, nail changes, pallor and wound.  Neurological: Negative for dizziness, tingling, tremors, seizures, syncope, facial asymmetry, speech difficulty, weakness, light-headedness, numbness and headaches.   Psychiatric/Behavioral: Negative for confusion.  All other systems reviewed and are negative.        Observations/Objective: No vital signs or physical exam, this was a telephone or virtual health encounter.  Pt alert and oriented, answers all questions appropriately, and able to speak in full sentences.    Assessment and Plan: Azel was seen today for hair/scalp problem and knee pain.  Diagnoses and all orders for this visit:  Seborrheic dermatitis of scalp Failed over the counter treatments, will initiate Nizoral shampoo. Report any new or worsening symptoms. Shampoo as prescribed.  -     ketoconazole (NIZORAL) 2 % shampoo; Apply 1 application topically 2 (two) times a week.  Posterior right knee pain Reported signs and symptoms concerning for Baker's cyst. No recent travel or surgeries, DVT not suspected. No calf or lower extremity swelling, just localized areas of swelling. Will get [place]. Follow up in 1-2 weeks. May require referral to ortho.  -     US SOFT TISSUE LOWER EXTREMITY LIMITED RIGHT (NON-VASCULAR); Future     Follow Up Instructions: Return in about 2 weeks (around 12/02/2018), or if symptoms worsen or fail to improve, for PCP for right calf pain.    I discussed the assessment and treatment plan with the patient. The patient was provided an opportunity to ask questions and all were answered. The patient agreed with the plan and demonstrated an understanding of the instructions.   The patient was advised to call back or seek an in-person evaluation if the symptoms worsen or if the condition fails to improve as anticipated.  The above assessment and management plan was discussed with the patient. The patient verbalized understanding of and has agreed to the management plan. Patient is aware to call the clinic if symptoms persist or worsen. Patient is aware when to return to the clinic for a follow-up visit. Patient educated on when it is appropriate to go to the  emergency department.    I provided 25 minutes of non-face-to-face time during this encounter. The call started at 0845. The call ended at 0910. The other time was used for coordination of care.    Kari Baars, FNP-C [facility] [address] [place], [place] [address] [phone]

## 2018-11-21 ENCOUNTER — Ambulatory Visit (HOSPITAL_COMMUNITY)
Admission: RE | Admit: 2018-11-21 | Discharge: 2018-11-21 | Disposition: A | Discharge status: Home / Self Care | Payer: Medicare Other | Source: Ambulatory Visit | Attending: Family Medicine | Admitting: Family Medicine

## 2018-11-21 ENCOUNTER — Telehealth: Payer: Self-pay | Admitting: Family Medicine

## 2018-11-21 ENCOUNTER — Ambulatory Visit (HOSPITAL_COMMUNITY): Admission: RE | Admit: 2018-11-21 | Payer: Medicare Other | Source: Ambulatory Visit

## 2018-11-21 ENCOUNTER — Other Ambulatory Visit: Payer: Self-pay

## 2018-11-21 DIAGNOSIS — M25561 Pain in right knee: Secondary | ICD-10-CM | POA: Diagnosis not present

## 2018-11-21 DIAGNOSIS — R6889 Other general symptoms and signs: Secondary | ICD-10-CM | POA: Diagnosis not present

## 2018-11-21 DIAGNOSIS — M7121 Synovial cyst of popliteal space [Baker], right knee: Secondary | ICD-10-CM | POA: Diagnosis not present

## 2018-11-21 NOTE — Telephone Encounter (Signed)
The [place] has not been read. As soon as I get a result, I will call

## 2018-11-21 NOTE — Telephone Encounter (Signed)
Pt calling back to get [place] results

## 2018-11-21 NOTE — Telephone Encounter (Signed)
Yes I am okay to go ahead and do referral to gastroenterology, diagnosis rectal bleeding, make it urgent referral.  If he continues to bleed more over the weekend then he might need to go to the emergency department.  If he has any lightheadedness or dizziness or chest pains over the weekend he is to go to the emergency department.

## 2018-11-21 NOTE — Telephone Encounter (Signed)
Pt aware of [place]- Rakes note.     Dr Dettinger (PCP) = he wanted me to let you know that he has bleeding rectally for 2 weeks now. Can he have a referral to GI? Says he has never been to GI. Thought it was from eating hot sauce - but stopped that and still having the bleeding.

## 2018-11-23 NOTE — Addendum Note (Signed)
Addended by: Sonny Masters on: 11/23/2018 09:36 AM   Modules accepted: Orders

## 2018-11-24 ENCOUNTER — Other Ambulatory Visit: Payer: Self-pay | Admitting: *Deleted

## 2018-11-24 DIAGNOSIS — K625 Hemorrhage of anus and rectum: Secondary | ICD-10-CM

## 2018-11-24 NOTE — Telephone Encounter (Signed)
Aware.  Referral done for gastroenterology.

## 2018-11-27 DIAGNOSIS — M7121 Synovial cyst of popliteal space [Baker], right knee: Secondary | ICD-10-CM | POA: Diagnosis not present

## 2018-11-27 DIAGNOSIS — M50321 Other cervical disc degeneration at C4-C5 level: Secondary | ICD-10-CM | POA: Diagnosis not present

## 2018-11-27 DIAGNOSIS — R6889 Other general symptoms and signs: Secondary | ICD-10-CM | POA: Diagnosis not present

## 2018-12-02 ENCOUNTER — Other Ambulatory Visit: Payer: Self-pay

## 2018-12-02 ENCOUNTER — Telehealth: Payer: Self-pay | Admitting: Family Medicine

## 2018-12-02 ENCOUNTER — Ambulatory Visit (INDEPENDENT_AMBULATORY_CARE_PROVIDER_SITE_OTHER): Payer: Medicare Other | Admitting: Internal Medicine

## 2018-12-02 DIAGNOSIS — K625 Hemorrhage of anus and rectum: Secondary | ICD-10-CM

## 2018-12-02 DIAGNOSIS — L219 Seborrheic dermatitis, unspecified: Secondary | ICD-10-CM

## 2018-12-02 NOTE — Progress Notes (Signed)
Patient was referred by Dr. Louanne Skye I tried the only phone number listed for the patient in his contacts and received a message that his voicemail had not yet been set up.  This prevented me from leaving a message  Thus this consult visit was unable to be performed  Please let Dr. Louanne Skye know that the patient was a no-show for this appointment and that we were not able to reach him  I am happy to have him reschedule if the patient is agreeable

## 2018-12-03 ENCOUNTER — Telehealth: Payer: Self-pay | Admitting: Internal Medicine

## 2018-12-03 MED ORDER — FLUCONAZOLE 150 MG PO TABS: 150.0000 mg | ORAL_TABLET | ORAL | 0 refills | Status: DC

## 2018-12-03 NOTE — Telephone Encounter (Signed)
Dr. Pyrtle please see note below. 

## 2018-12-03 NOTE — Telephone Encounter (Signed)
Patient aware - states he would rather go to dermatology now. States he does not want to take medication until he knows the diagnosis. Please advise

## 2018-12-03 NOTE — Progress Notes (Signed)
Patient states he is going to call the office.

## 2018-12-03 NOTE — Telephone Encounter (Signed)
Referral placed patient aware  

## 2018-12-03 NOTE — Telephone Encounter (Signed)
PT is calling back from message from yesterday wants to know if Dr Dettinger could send in something else since that medication for his scalp is not helping

## 2018-12-03 NOTE — Telephone Encounter (Signed)
Okay go ahead and refer him to dermatology

## 2018-12-03 NOTE — Telephone Encounter (Signed)
Rude behavior is not acceptable, we are working to maintain patient safety and provide care in the best way we can.  He can be offered an in person visit tomorrow at 3 pm (spot currently available) or June 2 at 11:30

## 2018-12-03 NOTE — Telephone Encounter (Signed)
Have him keep using the shampoo but I gave him a pill that he can take 1 now and 1 in a week from now to get him a jump start and see if that improves, if that does not improve then it may not be fungus and we need to go see a dermatologist.

## 2018-12-03 NOTE — Telephone Encounter (Signed)
Pt called regarding telephone visit that he misses yesterday with Dr. Rhea Belton, he was upset because he stated that he was at his house all day and nobody called. I verified his phone number and the one on file is correct. Pt stated that he does not want to be treated over the phone, he wanted to come to the office. I tried to explained to him why we are being very cautions about bringing patients in the office but he seemed not to understand the situation. He stated that it is very unprofessional to treat patients over the phone and proceeded to say rude things about Dr. Rhea Belton and even myself. I just wanted to bring this to Dr. Lauro Franklin attention.

## 2018-12-04 DIAGNOSIS — Z029 Encounter for administrative examinations, unspecified: Secondary | ICD-10-CM

## 2018-12-04 NOTE — Telephone Encounter (Signed)
Attempted to call pt but received message that "voicemail box has not been set up yet." Will try again. 

## 2018-12-09 ENCOUNTER — Other Ambulatory Visit: Payer: Self-pay

## 2018-12-09 NOTE — Telephone Encounter (Signed)
Pt did not want to reschedule appt.

## 2018-12-10 ENCOUNTER — Ambulatory Visit: Payer: Medicare Other | Admitting: Family Medicine

## 2018-12-10 ENCOUNTER — Ambulatory Visit (INDEPENDENT_AMBULATORY_CARE_PROVIDER_SITE_OTHER): Payer: Medicare Other | Admitting: Family

## 2018-12-10 ENCOUNTER — Encounter: Payer: Self-pay | Admitting: Family

## 2018-12-10 DIAGNOSIS — M7121 Synovial cyst of popliteal space [Baker], right knee: Secondary | ICD-10-CM | POA: Diagnosis not present

## 2018-12-10 DIAGNOSIS — M25561 Pain in right knee: Secondary | ICD-10-CM

## 2018-12-10 DIAGNOSIS — Z96651 Presence of right artificial knee joint: Secondary | ICD-10-CM | POA: Diagnosis not present

## 2018-12-10 MED ORDER — SULFAMETHOXAZOLE-TRIMETHOPRIM 800-160 MG PO TABS: 1.0000 | ORAL_TABLET | Freq: Two times a day (BID) | ORAL | 0 refills | Status: DC

## 2018-12-10 NOTE — Progress Notes (Signed)
   Virtual Visit via telephone Note  I connected with Alex [name] on 12/10/18 at 4:20 pm by telephone and verified that I am speaking with the correct person using two identifiers. Julien Skarda is currently located at home and no one is currently with her during visit. The provider, Jannifer Rodney, FNP is located in their office at time of visit.  I discussed the limitations, risks, security and privacy concerns of performing an evaluation and management service by telephone and the availability of in person appointments. I also discussed with the patient that there may be a patient responsible charge related to this service. The patient expressed understanding and agreed to proceed.   History and Present Illness:  PT calls today with right knee pain. He states he had a cyst aspirated in his right knee on 11/27/18. He states since he has been having pain intermittent 7 out 10. He states his knee feels warm. Denies redness or fever.   Knee Pain   The pain is present in the right knee. The quality of the pain is described as aching. The pain is at a severity of 7/10. The pain is moderate. He reports no foreign bodies present. The symptoms are aggravated by weight bearing and palpation. He has tried rest for the symptoms. The treatment provided mild relief.      Review of Systems  All other systems reviewed and are negative.    Observations/Objective: No SOB or distress  Assessment and Plan: 1. Synovial cyst of right popliteal space - sulfamethoxazole-trimethoprim (BACTRIM DS) 800-160 MG tablet; Take 1 tablet by mouth 2 (two) times daily.  Dispense: 14 tablet; Refill: 0  2. Right knee pain, unspecified chronicity - sulfamethoxazole-trimethoprim (BACTRIM DS) 800-160 MG tablet; Take 1 tablet by mouth 2 (two) times daily.  Dispense: 14 tablet; Refill: 0  3. History of total right knee replacement - sulfamethoxazole-trimethoprim (BACTRIM DS) 800-160 MG tablet; Take 1 tablet by mouth 2  (two) times daily.  Dispense: 14 tablet; Refill: 0  Since pain is increasing and having warmth will treat with bactrim today Report any fevers or if symptoms worsen Keep all ortho and PCP follow ups   I discussed the assessment and treatment plan with the patient. The patient was provided an opportunity to ask questions and all were answered. The patient agreed with the plan and demonstrated an understanding of the instructions.   The patient was advised to call back or seek an in-person evaluation if the symptoms worsen or if the condition fails to improve as anticipated.  The above assessment and management plan was discussed with the patient. The patient verbalized understanding of and has agreed to the management plan. Patient is aware to call the clinic if symptoms persist or worsen. Patient is aware when to return to the clinic for a follow-up visit. Patient educated on when it is appropriate to go to the emergency department.   Time call ended:  4:38 pm  I provided 18 minutes of non-face-to-face time during this encounter.    Jannifer Rodney, FNP

## 2018-12-11 ENCOUNTER — Telehealth: Payer: Self-pay | Admitting: Family Medicine

## 2018-12-11 NOTE — Telephone Encounter (Signed)
FYI The antibiotics has helped a whole lot swelling in his knee has went down wanted to let Dr Dettinger know

## 2018-12-11 NOTE — Telephone Encounter (Signed)
FYI for provider.  No response to pool necessary.

## 2018-12-15 DIAGNOSIS — T8484XA Pain due to internal orthopedic prosthetic devices, implants and grafts, initial encounter: Secondary | ICD-10-CM | POA: Diagnosis not present

## 2018-12-15 DIAGNOSIS — M25561 Pain in right knee: Secondary | ICD-10-CM | POA: Diagnosis not present

## 2018-12-15 DIAGNOSIS — M25469 Effusion, unspecified knee: Secondary | ICD-10-CM | POA: Diagnosis not present

## 2018-12-15 DIAGNOSIS — R6889 Other general symptoms and signs: Secondary | ICD-10-CM | POA: Diagnosis not present

## 2018-12-18 ENCOUNTER — Other Ambulatory Visit: Payer: Self-pay | Admitting: Family Medicine

## 2018-12-18 DIAGNOSIS — L219 Seborrheic dermatitis, unspecified: Secondary | ICD-10-CM

## 2018-12-19 ENCOUNTER — Encounter: Payer: Self-pay | Admitting: Family Medicine

## 2018-12-19 ENCOUNTER — Other Ambulatory Visit: Payer: Self-pay

## 2018-12-19 ENCOUNTER — Other Ambulatory Visit: Payer: Self-pay | Admitting: Family Medicine

## 2018-12-19 ENCOUNTER — Ambulatory Visit (INDEPENDENT_AMBULATORY_CARE_PROVIDER_SITE_OTHER): Payer: Medicare Other | Admitting: Family Medicine

## 2018-12-19 ENCOUNTER — Emergency Department (HOSPITAL_COMMUNITY): Payer: Medicare Other

## 2018-12-19 ENCOUNTER — Encounter (HOSPITAL_COMMUNITY): Payer: Self-pay

## 2018-12-19 ENCOUNTER — Emergency Department (HOSPITAL_COMMUNITY)
Admission: EM | Admit: 2018-12-19 | Discharge: 2018-12-19 | Disposition: A | Discharge status: Home / Self Care | Payer: Medicare Other | Attending: Emergency Medicine | Admitting: Emergency Medicine

## 2018-12-19 DIAGNOSIS — R200 Anesthesia of skin: Secondary | ICD-10-CM | POA: Insufficient documentation

## 2018-12-19 DIAGNOSIS — Z7984 Long term (current) use of oral hypoglycemic drugs: Secondary | ICD-10-CM | POA: Insufficient documentation

## 2018-12-19 DIAGNOSIS — E119 Type 2 diabetes mellitus without complications: Secondary | ICD-10-CM | POA: Diagnosis not present

## 2018-12-19 DIAGNOSIS — I10 Essential (primary) hypertension: Secondary | ICD-10-CM | POA: Insufficient documentation

## 2018-12-19 DIAGNOSIS — R1111 Vomiting without nausea: Secondary | ICD-10-CM | POA: Diagnosis not present

## 2018-12-19 DIAGNOSIS — M25531 Pain in right wrist: Secondary | ICD-10-CM | POA: Insufficient documentation

## 2018-12-19 DIAGNOSIS — Z79899 Other long term (current) drug therapy: Secondary | ICD-10-CM | POA: Diagnosis not present

## 2018-12-19 DIAGNOSIS — M5412 Radiculopathy, cervical region: Secondary | ICD-10-CM

## 2018-12-19 DIAGNOSIS — R5381 Other malaise: Secondary | ICD-10-CM | POA: Diagnosis not present

## 2018-12-19 DIAGNOSIS — M542 Cervicalgia: Secondary | ICD-10-CM | POA: Diagnosis present

## 2018-12-19 MED ORDER — OXYCODONE-ACETAMINOPHEN 5-325 MG PO TABS: 1.0000 | ORAL_TABLET | Freq: Four times a day (QID) | ORAL | 0 refills | Status: DC | PRN

## 2018-12-19 MED ORDER — PREDNISONE 10 MG PO TABS: 20.0000 mg | ORAL_TABLET | Freq: Two times a day (BID) | ORAL | 0 refills | Status: DC

## 2018-12-19 MED ORDER — HYDROMORPHONE HCL 2 MG/ML IJ SOLN
2.0000 mg | Freq: Once | INTRAMUSCULAR | Status: AC
  Administered 2018-12-19: 2 mg via INTRAMUSCULAR
  Filled 2018-12-19: qty 1

## 2018-12-19 NOTE — Discharge Instructions (Addendum)
Begin taking prednisone as prescribed.    Percocet as prescribed as needed for pain.  Follow-up with your spine surgeon tomorrow as instructed by the on-call physician you spoke with earlier this evening.

## 2018-12-19 NOTE — ED Provider Notes (Signed)
[facility] Provider Note   CSN: [id number] Arrival date & time: 12/19/18  0202     History   Chief Complaint Chief Complaint  Patient presents with  . Pain    HPI Chandlor Noecker is a 58 y.o. male.     Patient is a 58 year old male with history of diabetes, hypertension, and prior neck fusion.  He presents today for evaluation of severe neck pain.  This is been ongoing for many months, however became much worse this evening.  He denies any injury or trauma.  He describes severe pain to the back of his neck that radiates into his left arm left jaw, left shoulder.  Pain is worse with movement and palpation.  He states that he is unable to turn his head to the right secondary to this discomfort.  He denies any weakness or numbness of his arm or leg.  The history is provided by the patient.    Past Medical History:  Diagnosis Date  . Diabetes mellitus without complication ([place])   . History of kidney stones    removed 5 months ago  . Hypertension   . PONV (postoperative nausea and vomiting)    AGITATION after anesthesia as well  . Sleep apnea    NO CPAP    Patient Active Problem List   Diagnosis Date Noted  . Hypertensive urgency 03/28/2018  . S/P cervical spinal fusion 03/27/2018  . Morbid obesity ([place]) 02/21/2018  . Diabetes mellitus type 2 in obese ([place]) 03/28/2017  . Hypertension associated with diabetes ([place]) 08/25/2016    Past Surgical History:  Procedure Laterality Date  . ANTERIOR CERVICAL DECOMP/DISCECTOMY FUSION N/A 03/27/2018   Procedure: ANTERIOR CERVICAL DECOMPRESSION/DISCECTOMY FUSION C3-5;  Surgeon: Melina Schools, MD;  Location: [facility];  Service: Orthopedics;  Laterality: N/A;  3.5 hrs  . arm surgery Right    x2 involving tendons/ulnar nerve  . BACK SURGERY    . CARPAL TUNNEL RELEASE Right   . clavical surgery    . EYE SURGERY Bilateral    glaucoma  . HERNIA REPAIR    . JOINT REPLACEMENT Right 06/2011  . TRICEPS TENDON REPAIR           Home Medications    Prior to Admission medications   Medication Sig Start Date End Date Taking? Authorizing Provider  amLODipine (NORVASC) 5 MG tablet Take 1 tablet (5 mg total) by mouth daily. 09/26/18   Dettinger, Fransisca Kaufmann, MD  carvedilol (COREG) 6.25 MG tablet Take 1 tablet (6.25 mg total) by mouth 2 (two) times daily. 09/26/18   Dettinger, Fransisca Kaufmann, MD  diclofenac (VOLTAREN) 75 MG EC tablet TAKE (1) TABLET TWICE A DAY. 11/03/18   Dettinger, Fransisca Kaufmann, MD  diclofenac sodium (VOLTAREN) 1 % GEL Apply 2 g topically 4 (four) times daily. 06/25/18   Dettinger, Fransisca Kaufmann, MD  fluconazole (DIFLUCAN) 150 MG tablet Take 1 tablet (150 mg total) by mouth once a week. 12/03/18   Dettinger, Fransisca Kaufmann, MD  ketoconazole (NIZORAL) 2 % shampoo Apply topically 2 (two) times a week. 12/18/18   Dettinger, Fransisca Kaufmann, MD  lisinopril (PRINIVIL,ZESTRIL) 40 MG tablet Take 1 tablet (40 mg total) by mouth daily. 09/26/18   Dettinger, Fransisca Kaufmann, MD  metFORMIN (GLUCOPHAGE) 500 MG tablet TAKE 2 TABLET BY MOUTH TWICE A DAY 09/26/18   Dettinger, Fransisca Kaufmann, MD  methocarbamol (ROBAXIN) 500 MG tablet Take 1 tablet (500 mg total) by mouth 3 (three) times daily. 03/27/18   Mayo, Darla Lesches, PA-C  Omega 3 1000 MG CAPS Take 1,000 mg by mouth daily.     [provider]  ondansetron (ZOFRAN ODT) 4 MG disintegrating tablet Take 1 tablet (4 mg total) by mouth every 8 (eight) hours as needed. 03/27/18   Mayo, Baxter Kailarmen Christina, PA-C  oxymetazoline (AFRIN) 0.05 % nasal spray Place 1 spray into both nostrils daily as needed for congestion.    [provider]  pregabalin (LYRICA) 75 MG capsule Take 1 capsule (75 mg total) by mouth 2 (two) times daily. 09/26/18   Dettinger, Elige RadonJoshua A, MD  sulfamethoxazole-trimethoprim (BACTRIM DS) 800-160 MG tablet Take 1 tablet by mouth 2 (two) times daily. 12/10/18   Junie SpencerHawks, Christy A, FNP    Family History Family History  Problem Relation Age of Onset  . Cancer Mother   . Diabetes  Father   . Hypertension Father   . Heart attack Father     Social History Social History   Tobacco Use  . Smoking status: Never Smoker  . Smokeless tobacco: Current User    Types: Chew  Substance Use Topics  . Alcohol use: No  . Drug use: No     Allergies   Morphine and related, Buprenorphine hcl-naloxone hcl, and Codeine   Review of Systems Review of Systems  All other systems reviewed and are negative.    Physical Exam Updated Vital Signs BP (!) 169/111 (BP Location: Right Arm)   Pulse 74   Temp 97.9 F (36.6 C) (Oral)   Resp 19   Ht 5\' 6"  (1.676 m)   Wt 102.1 kg   SpO2 99%   BMI 36.32 kg/m   Physical Exam Vitals signs and nursing note reviewed.  Constitutional:      General: He is not in acute distress.    Appearance: He is well-developed. He is not diaphoretic.  HENT:     Head: Normocephalic and atraumatic.  Neck:     Musculoskeletal: Normal range of motion and neck supple.     Comments: There is tenderness to palpation in the soft tissues of the posterior cervical region and lateral neck. Cardiovascular:     Rate and Rhythm: Normal rate and regular rhythm.     Heart sounds: No murmur. No friction rub.  Pulmonary:     Effort: Pulmonary effort is normal. No respiratory distress.     Breath sounds: Normal breath sounds. No wheezing or rales.  Abdominal:     General: Bowel sounds are normal. There is no distension.     Palpations: Abdomen is soft.     Tenderness: There is no abdominal tenderness.  Musculoskeletal: Normal range of motion.     Comments: Bilateral upper extremities are symmetrical and normal in appearance.  Ulnar and radial pulses are easily palpable.  He is able to flex, extend, and oppose all fingers.  Sensation is intact throughout both hands.  Skin:    General: Skin is warm and dry.  Neurological:     Mental Status: He is alert and oriented to person, place, and time.     Coordination: Coordination normal.      ED Treatments /  Results  Labs (all labs ordered are listed, but only abnormal results are displayed) Labs Reviewed - No data to display  EKG EKG Interpretation  Date/Time:  Friday December 19 2018 02:09:17 EDT Ventricular Rate:  71 PR Interval:    QRS Duration: 112 QT Interval:  383 QTC Calculation: 417 R Axis:   -13 Text Interpretation:  Sinus rhythm Borderline intraventricular  conduction delay Baseline wander in lead(s) V2 Confirmed by Geoffery LyonseLo, Varnell Orvis ([id number]) on 12/19/2018 2:11:41 AM   Radiology No results found.  Procedures Procedures (including critical care time)  Medications Ordered in ED Medications  HYDROmorphone (DILAUDID) injection 2 mg (has no administration in time range)     Initial Impression / Assessment and Plan / ED Course  I have reviewed the triage vital signs and the nursing notes.  Pertinent labs & imaging results that were available during my care of the patient were reviewed by me and considered in my medical decision making (see chart for details).  Patient presenting with complaints of severe pain to the left side of his neck that is clearly musculoskeletal in nature.  Patient has history of prior neck surgery.  CT scan obtained this evening shows moderate C5 and C6 foraminal narrowing, but nothing on exam or in the imaging study appears emergently surgical.  Patient given IM pain injections and is feeling somewhat better.  Patient is to call his spine surgeon at 8 AM to arrange a follow-up appointment.  He is already spoken with the on-call physician this evening and was instructed to call the office this morning.  Final Clinical Impressions(s) / ED Diagnoses   Final diagnoses:  None    ED Discharge Orders    None       Geoffery Lyonselo, Bradshaw Minihan, MD 12/19/18 [phone]

## 2018-12-19 NOTE — ED Triage Notes (Signed)
Pt reports chronic pain since neck fusion surgery 7 months ago.

## 2018-12-19 NOTE — Progress Notes (Signed)
Virtual Visit via telephone Note  I connected with Alex [name] on 12/19/18 at 0913 by telephone and verified that I am speaking with the correct person using two identifiers. Alex [name] is currently located at home and no other people are currently with her during visit. The provider, Fransisca Kaufmann , MD is located in their office at time of visit.  Call ended at 0930  I discussed the limitations, risks, security and privacy concerns of performing an evaluation and management service by telephone and the availability of in person appointments. I also discussed with the patient that there may be a patient responsible charge related to this service. The patient expressed understanding and agreed to proceed.   History and Present Illness: Patient has nerve numbness and pins and needles in right lateral arm.  Patient has decreased range of motion in the  [place]bow and has limited use of right arm. He was initially placed on disability because of a work accident in [place].  He denies any improvement recently because of how many years ago this happened.   No diagnosis found.  Outpatient Encounter Medications as of 12/19/2018  Medication Sig  . amLODipine (NORVASC) 5 MG tablet Take 1 tablet (5 mg total) by mouth daily.  . carvedilol (COREG) 6.25 MG tablet Take 1 tablet (6.25 mg total) by mouth 2 (two) times daily.  . diclofenac (VOLTAREN) 75 MG EC tablet TAKE (1) TABLET TWICE A DAY.  Marland Kitchen diclofenac sodium (VOLTAREN) 1 % GEL Apply 2 g topically 4 (four) times daily.  . fluconazole (DIFLUCAN) 150 MG tablet Take 1 tablet (150 mg total) by mouth once a week.  Marland Kitchen ketoconazole (NIZORAL) 2 % shampoo Apply topically 2 (two) times a week.  Marland Kitchen lisinopril (PRINIVIL,ZESTRIL) 40 MG tablet Take 1 tablet (40 mg total) by mouth daily.  . metFORMIN (GLUCOPHAGE) 500 MG tablet TAKE 2 TABLET BY MOUTH TWICE A DAY  . methocarbamol (ROBAXIN) 500 MG tablet Take 1 tablet (500 mg total) by mouth 3 (three) times  daily.  . Omega 3 1000 MG CAPS Take 1,000 mg by mouth daily.   . ondansetron (ZOFRAN ODT) 4 MG disintegrating tablet Take 1 tablet (4 mg total) by mouth every 8 (eight) hours as needed.  Marland Kitchen oxyCODONE-acetaminophen (PERCOCET) 5-325 MG tablet Take 1-2 tablets by mouth every 6 (six) hours as needed.  Marland Kitchen oxymetazoline (AFRIN) 0.05 % nasal spray Place 1 spray into both nostrils daily as needed for congestion.  . predniSONE (DELTASONE) 10 MG tablet Take 2 tablets (20 mg total) by mouth 2 (two) times daily.  . pregabalin (LYRICA) 75 MG capsule Take 1 capsule (75 mg total) by mouth 2 (two) times daily.  Marland Kitchen sulfamethoxazole-trimethoprim (BACTRIM DS) 800-160 MG tablet Take 1 tablet by mouth 2 (two) times daily.   No facility-administered encounter medications on file as of 12/19/2018.     Review of Systems  Constitutional: Negative for chills and fever.  Eyes: Negative for visual disturbance.  Respiratory: Negative for shortness of breath and wheezing.   Cardiovascular: Negative for chest pain and leg swelling.  Musculoskeletal: Positive for arthralgias and myalgias. Negative for back pain and gait problem.  Skin: Negative for color change and rash.  Neurological: Negative for dizziness, weakness and light-headedness.  All other systems reviewed and are negative.   Observations/Objective: Patient sounds comfortable and in no acute distress  Assessment and Plan: Problem List Items Addressed This Visit      Other   Right wrist pain   Right arm numbness  Follow Up Instructions:  As needed, patient will request records from previous disability doctor.    I discussed the assessment and treatment plan with the patient. The patient was provided an opportunity to ask questions and all were answered. The patient agreed with the plan and demonstrated an understanding of the instructions.   The patient was advised to call back or seek an in-person evaluation if the symptoms worsen or if the  condition fails to improve as anticipated.  The above assessment and management plan was discussed with the patient. The patient verbalized understanding of and has agreed to the management plan. Patient is aware to call the clinic if symptoms persist or worsen. Patient is aware when to return to the clinic for a follow-up visit. Patient educated on when it is appropriate to go to the emergency department.    I provided 17 minutes of non-face-to-face time during this encounter.    Nils PyleJoshua A , MD

## 2018-12-22 ENCOUNTER — Other Ambulatory Visit: Payer: Self-pay

## 2018-12-22 ENCOUNTER — Other Ambulatory Visit: Payer: Medicare Other

## 2018-12-22 DIAGNOSIS — T8484XA Pain due to internal orthopedic prosthetic devices, implants and grafts, initial encounter: Secondary | ICD-10-CM | POA: Diagnosis not present

## 2018-12-22 DIAGNOSIS — M25561 Pain in right knee: Secondary | ICD-10-CM | POA: Diagnosis not present

## 2018-12-25 DIAGNOSIS — M792 Neuralgia and neuritis, unspecified: Secondary | ICD-10-CM | POA: Diagnosis not present

## 2018-12-29 ENCOUNTER — Ambulatory Visit: Payer: Medicare Other | Admitting: Family Medicine

## 2018-12-29 ENCOUNTER — Other Ambulatory Visit: Payer: Self-pay

## 2018-12-30 ENCOUNTER — Ambulatory Visit (INDEPENDENT_AMBULATORY_CARE_PROVIDER_SITE_OTHER): Payer: Medicare Other | Admitting: Family Medicine

## 2018-12-30 ENCOUNTER — Encounter: Payer: Self-pay | Admitting: Family Medicine

## 2018-12-30 ENCOUNTER — Other Ambulatory Visit: Payer: Self-pay

## 2018-12-30 VITALS — BP 141/80 | HR 69 | Temp 97.5°F | Ht 66.0 in | Wt 225.2 lb

## 2018-12-30 DIAGNOSIS — I10 Essential (primary) hypertension: Secondary | ICD-10-CM | POA: Diagnosis not present

## 2018-12-30 DIAGNOSIS — E1159 Type 2 diabetes mellitus with other circulatory complications: Secondary | ICD-10-CM

## 2018-12-30 DIAGNOSIS — M4722 Other spondylosis with radiculopathy, cervical region: Secondary | ICD-10-CM

## 2018-12-30 DIAGNOSIS — E669 Obesity, unspecified: Secondary | ICD-10-CM

## 2018-12-30 DIAGNOSIS — E1169 Type 2 diabetes mellitus with other specified complication: Secondary | ICD-10-CM

## 2018-12-30 DIAGNOSIS — M47812 Spondylosis without myelopathy or radiculopathy, cervical region: Secondary | ICD-10-CM | POA: Insufficient documentation

## 2018-12-30 DIAGNOSIS — I152 Hypertension secondary to endocrine disorders: Secondary | ICD-10-CM

## 2018-12-30 LAB — BAYER DCA HB A1C WAIVED: HB A1C (BAYER DCA - WAIVED): 6 % (ref <7.0)

## 2018-12-30 NOTE — Progress Notes (Signed)
BP (!) 141/80   Pulse 69   Temp (!) 97.5 F (36.4 C) (Oral)   Ht 5\' 6"  (1.676 m)   Wt 225 lb 3.2 oz (102.2 kg)   BMI 36.35 kg/m    Subjective:   Patient ID: Alex [name], male    DOB: [birth year], 58 y.o.   MRN: [id number]  HPI: Alex MedinaRonald Fenter is a 58 y.o. male presenting on 12/30/2018 for Diabetes (3 month follow up- patient wants fungus on his head checked ), Hypertension, and Neck Pain (ongoing )   HPI Type 2 diabetes mellitus Patient comes in today for recheck of his diabetes. Patient has been currently taking metformin. Patient is currently on an ACE inhibitor/ARB. Patient has not seen an ophthalmologist this year. Patient denies any issues with their feet.   Hypertension Patient is currently on lisinopril and carvedilol and amlodipine, and their blood pressure today is 141/80. Patient denies any lightheadedness or dizziness. Patient denies headaches, blurred vision, chest pains, shortness of breath, or weakness. Denies any side effects from medication and is content with current medication.   Has chronic pain from cervical spine issues and issues with a knee replacement on his right knee.  He currently has an orthopedic working on his knee and he has a spinal doctor and Dr. Shon BatonBrooks who he should go back and see for his cervical spine issues.  It sounds like he has a compressed nerve that needs to be examined.  Relevant past medical, surgical, family and social history reviewed and updated as indicated. Interim medical history since our last visit reviewed. Allergies and medications reviewed and updated.  Review of Systems  Constitutional: Negative for chills and fever.  Eyes: Negative for visual disturbance.  Respiratory: Negative for shortness of breath and wheezing.   Cardiovascular: Negative for chest pain and leg swelling.  Musculoskeletal: Negative for back pain and gait problem.  Skin: Negative for rash.  Neurological: Negative for dizziness, weakness and  light-headedness.  All other systems reviewed and are negative.   Per HPI unless specifically indicated above   Allergies as of 12/30/2018      Reactions   Morphine And Related Nausea Only   Buprenorphine Hcl-naloxone Hcl Nausea Only   Codeine Nausea Only, Other (See Comments)   per pt, "felt weird in my head"      Medication List       Accurate as of December 30, 2018  8:48 AM. If you have any questions, ask your nurse or doctor.        STOP taking these medications   fluconazole 150 MG tablet Commonly known as: Diflucan Stopped by: Nils PyleJoshua A Jalysa Swopes, MD   predniSONE 10 MG tablet Commonly known as: DELTASONE Stopped by: Elige RadonJoshua A Australia Droll, MD   sulfamethoxazole-trimethoprim 800-160 MG tablet Commonly known as: Bactrim DS Stopped by: Elige RadonJoshua A Devan Babino, MD     TAKE these medications   amLODipine 5 MG tablet Commonly known as: NORVASC Take 1 tablet (5 mg total) by mouth daily.   carvedilol 6.25 MG tablet Commonly known as: COREG Take 1 tablet (6.25 mg total) by mouth 2 (two) times daily.   diclofenac 75 MG EC tablet Commonly known as: VOLTAREN TAKE (1) TABLET TWICE A DAY.   diclofenac sodium 1 % Gel Commonly known as: Voltaren Apply 2 g topically 4 (four) times daily.   ketoconazole 2 % shampoo Commonly known as: NIZORAL Apply topically 2 (two) times a week.   lisinopril 40 MG tablet Commonly known as: ZESTRIL Take 1  tablet (40 mg total) by mouth daily.   metFORMIN 500 MG tablet Commonly known as: GLUCOPHAGE TAKE 2 TABLET BY MOUTH TWICE A DAY   methocarbamol 500 MG tablet Commonly known as: Robaxin Take 1 tablet (500 mg total) by mouth 3 (three) times daily.   Omega 3 1000 MG Caps Take 1,000 mg by mouth daily.   ondansetron 4 MG disintegrating tablet Commonly known as: Zofran ODT Take 1 tablet (4 mg total) by mouth every 8 (eight) hours as needed.   oxyCODONE-acetaminophen 5-325 MG tablet Commonly known as: Percocet Take 1-2 tablets by mouth  every 6 (six) hours as needed.   oxymetazoline 0.05 % nasal spray Commonly known as: AFRIN Place 1 spray into both nostrils daily as needed for congestion.   pregabalin 75 MG capsule Commonly known as: LYRICA Take 1 capsule (75 mg total) by mouth 2 (two) times daily.        Objective:   BP (!) 141/80   Pulse 69   Temp (!) 97.5 F (36.4 C) (Oral)   Ht 5\' 6"  (1.676 m)   Wt 225 lb 3.2 oz (102.2 kg)   BMI 36.35 kg/m   Wt Readings from Last 3 Encounters:  12/30/18 225 lb 3.2 oz (102.2 kg)  12/19/18 225 lb (102.1 kg)  09/26/18 220 lb 9.6 oz (100.1 kg)    Physical Exam Vitals signs and nursing note reviewed.  Constitutional:      General: He is not in acute distress.    Appearance: He is well-developed. He is not diaphoretic.  Eyes:     General: No scleral icterus.    Conjunctiva/sclera: Conjunctivae normal.  Neck:     Musculoskeletal: Neck supple.     Thyroid: No thyromegaly.  Cardiovascular:     Rate and Rhythm: Normal rate and regular rhythm.     Heart sounds: Normal heart sounds. No murmur.  Pulmonary:     Effort: Pulmonary effort is normal. No respiratory distress.     Breath sounds: Normal breath sounds. No wheezing.  Musculoskeletal:     Right knee: He exhibits decreased range of motion (Slight decreased range of motion with swelling), swelling and effusion. He exhibits no ecchymosis and no deformity. Tenderness found. Medial joint line and lateral joint line tenderness noted.     Cervical back: He exhibits tenderness (Bilateral tenderness, worse on right). He exhibits no deformity.  Lymphadenopathy:     Cervical: No cervical adenopathy.  Skin:    General: Skin is warm and dry.     Findings: No rash.  Neurological:     Mental Status: He is alert and oriented to person, place, and time.     Coordination: Coordination normal.  Psychiatric:        Behavior: Behavior normal.       Assessment & Plan:   Problem List Items Addressed This Visit       Cardiovascular and Mediastinum   Hypertension associated with diabetes ([place]) - Primary     Endocrine   Diabetes mellitus type 2 in obese ([place])   Relevant Orders   Bayer DCA Hb A1c Waived     Musculoskeletal and Integument   Cervical spine degeneration     Other   Morbid obesity ([place])      For cervical spine and knee arthritis, recommended to follow-up with the orthopedic and the spinal doctor.  Continue metformin, patient has been doing well on it to this point.  Continue lisinopril and Lyrica Follow up plan: Return in about 3  months (around 04/01/2019), or if symptoms worsen or fail to improve, for Recheck diabetes and hypertension.  Counseling provided for all of the vaccine components No orders of the defined types were placed in this encounter.   Arville CareJoshua Kenyatte Gruber, MD [facility] 12/30/2018, 8:48 AM

## 2019-01-01 DIAGNOSIS — M792 Neuralgia and neuritis, unspecified: Secondary | ICD-10-CM | POA: Insufficient documentation

## 2019-01-05 DIAGNOSIS — T8484XA Pain due to internal orthopedic prosthetic devices, implants and grafts, initial encounter: Secondary | ICD-10-CM | POA: Diagnosis not present

## 2019-01-16 ENCOUNTER — Other Ambulatory Visit: Payer: Self-pay

## 2019-01-16 ENCOUNTER — Ambulatory Visit (INDEPENDENT_AMBULATORY_CARE_PROVIDER_SITE_OTHER): Payer: Medicare HMO | Admitting: Family Medicine

## 2019-01-16 ENCOUNTER — Encounter: Payer: Self-pay | Admitting: Family Medicine

## 2019-01-16 DIAGNOSIS — L219 Seborrheic dermatitis, unspecified: Secondary | ICD-10-CM | POA: Diagnosis not present

## 2019-01-16 DIAGNOSIS — M25561 Pain in right knee: Secondary | ICD-10-CM | POA: Diagnosis not present

## 2019-01-16 MED ORDER — FLUCONAZOLE 150 MG PO TABS: 150.0000 mg | ORAL_TABLET | ORAL | 1 refills | Status: DC

## 2019-01-16 MED ORDER — PREDNISONE 20 MG PO TABS: ORAL_TABLET | ORAL | 0 refills | Status: DC

## 2019-01-16 NOTE — Progress Notes (Signed)
Virtual Visit via telephone Note  I connected with Alex [name] on 01/16/19 at 1307 by telephone and verified that I am speaking with the correct person using two identifiers. Alex MedinaRonald Shindler is currently located at home and no other people are currently with her during visit. The provider, Elige RadonJoshua A Nazyia Gaugh, MD is located in their office at time of visit.  Call ended at 1316  I discussed the limitations, risks, security and privacy concerns of performing an evaluation and management service by telephone and the availability of in person appointments. I also discussed with the patient that there may be a patient responsible charge related to this service. The patient expressed understanding and agreed to proceed.   History and Present Illness: Patient is coming in concerned with his right knee that is causing him continual problems, he is going for 2nd knee replacement because he is loose on that knee and it is bothering him again. Recommend prednisone and therapy  No diagnosis found.  Outpatient Encounter Medications as of 01/16/2019  Medication Sig  . amLODipine (NORVASC) 5 MG tablet Take 1 tablet (5 mg total) by mouth daily.  . carvedilol (COREG) 6.25 MG tablet Take 1 tablet (6.25 mg total) by mouth 2 (two) times daily.  . diclofenac (VOLTAREN) 75 MG EC tablet TAKE (1) TABLET TWICE A DAY.  Marland Kitchen. diclofenac sodium (VOLTAREN) 1 % GEL Apply 2 g topically 4 (four) times daily.  Marland Kitchen. ketoconazole (NIZORAL) 2 % shampoo Apply topically 2 (two) times a week.  Marland Kitchen. lisinopril (PRINIVIL,ZESTRIL) 40 MG tablet Take 1 tablet (40 mg total) by mouth daily.  . metFORMIN (GLUCOPHAGE) 500 MG tablet TAKE 2 TABLET BY MOUTH TWICE A DAY  . methocarbamol (ROBAXIN) 500 MG tablet Take 1 tablet (500 mg total) by mouth 3 (three) times daily.  . Omega 3 1000 MG CAPS Take 1,000 mg by mouth daily.   . ondansetron (ZOFRAN ODT) 4 MG disintegrating tablet Take 1 tablet (4 mg total) by mouth every 8 (eight) hours as needed.  Marland Kitchen.  oxyCODONE-acetaminophen (PERCOCET) 5-325 MG tablet Take 1-2 tablets by mouth every 6 (six) hours as needed.  Marland Kitchen. oxymetazoline (AFRIN) 0.05 % nasal spray Place 1 spray into both nostrils daily as needed for congestion.  . pregabalin (LYRICA) 75 MG capsule Take 1 capsule (75 mg total) by mouth 2 (two) times daily.   No facility-administered encounter medications on file as of 01/16/2019.     Review of Systems  Constitutional: Negative for chills and fever.  Respiratory: Negative for shortness of breath and wheezing.   Cardiovascular: Negative for chest pain and leg swelling.  Musculoskeletal: Positive for arthralgias. Negative for back pain and gait problem.  Skin: Positive for rash.  All other systems reviewed and are negative.   Observations/Objective: Patient sounds comfortable and in no acute distress  Assessment and Plan: Problem List Items Addressed This Visit    None    Visit Diagnoses    Right knee pain, unspecified chronicity    -  Primary   Relevant Medications   predniSONE (DELTASONE) 20 MG tablet   Seborrheic dermatitis of scalp       Relevant Medications   fluconazole (DIFLUCAN) 150 MG tablet       Follow Up Instructions: As needed    I discussed the assessment and treatment plan with the patient. The patient was provided an opportunity to ask questions and all were answered. The patient agreed with the plan and demonstrated an understanding of the instructions.   The  patient was advised to call back or seek an in-person evaluation if the symptoms worsen or if the condition fails to improve as anticipated.  The above assessment and management plan was discussed with the patient. The patient verbalized understanding of and has agreed to the management plan. Patient is aware to call the clinic if symptoms persist or worsen. Patient is aware when to return to the clinic for a follow-up visit. Patient educated on when it is appropriate to go to the emergency department.     I provided 9 minutes of non-face-to-face time during this encounter.    Worthy Rancher, MD

## 2019-01-28 ENCOUNTER — Other Ambulatory Visit: Payer: Self-pay | Admitting: Family Medicine

## 2019-01-28 DIAGNOSIS — L219 Seborrheic dermatitis, unspecified: Secondary | ICD-10-CM

## 2019-02-09 ENCOUNTER — Telehealth: Payer: Self-pay | Admitting: Family Medicine

## 2019-02-09 DIAGNOSIS — M25569 Pain in unspecified knee: Secondary | ICD-10-CM

## 2019-02-09 NOTE — Telephone Encounter (Signed)
Ortho referral placed

## 2019-02-16 ENCOUNTER — Other Ambulatory Visit: Payer: Self-pay | Admitting: Family Medicine

## 2019-02-16 ENCOUNTER — Ambulatory Visit (INDEPENDENT_AMBULATORY_CARE_PROVIDER_SITE_OTHER): Payer: Medicare HMO | Admitting: Family Medicine

## 2019-02-16 DIAGNOSIS — L089 Local infection of the skin and subcutaneous tissue, unspecified: Secondary | ICD-10-CM | POA: Diagnosis not present

## 2019-02-16 MED ORDER — CEPHALEXIN 500 MG PO CAPS: 500.0000 mg | ORAL_CAPSULE | Freq: Four times a day (QID) | ORAL | 0 refills | Status: AC

## 2019-02-16 MED ORDER — CEPHALEXIN 500 MG PO CAPS: 500.0000 mg | ORAL_CAPSULE | Freq: Four times a day (QID) | ORAL | 0 refills | Status: DC

## 2019-02-16 NOTE — Progress Notes (Signed)
Telephone visit  Subjective: CC: ?finger infection PCP: Alex, Alex RadonJoshua A, MD ZOX:WRUEAVHPI:Alex [name] is [name] 58 y.o. male calls for telephone consult today. Patient provides verbal consent for consult held via phone.  Location of patient: home Location of provider: Working remotely from home Others present for call: none  1. Finger injury Patient reports injury to the right middle finger on Saturday when he got his finger caught between [name] rock and piece of equipment.  He has been keeping it clean with peroxide.  He saw Lawanna Kobusngel in the community yesterday and she thought he might need an antibiotic.  He denies bleeding, purulence, fevers.  He notes [name] throbbing pain along the cuticle.  This area is red and inflamed.   ROS: Per HPI  Allergies  Allergen Reactions  . Morphine And Related Nausea Only  . Buprenorphine Hcl-Naloxone Hcl Nausea Only  . Codeine Nausea Only and Other (See Comments)    per pt, "felt weird in my head"   Past Medical History:  Diagnosis Date  . Diabetes mellitus without complication (HCC)   . History of kidney stones    removed 5 months ago  . Hypertension   . PONV (postoperative nausea and vomiting)    AGITATION after anesthesia as well  . Sleep apnea    NO CPAP    Current Outpatient Medications:  .  amLODipine (NORVASC) 5 MG tablet, Take 1 tablet (5 mg total) by mouth daily., Disp: 90 tablet, Rfl: 3 .  carvedilol (COREG) 6.25 MG tablet, Take 1 tablet (6.25 mg total) by mouth 2 (two) times daily., Disp: 90 tablet, Rfl: 3 .  diclofenac (VOLTAREN) 75 MG EC tablet, TAKE (1) TABLET TWICE [name] DAY., Disp: 60 tablet, Rfl: 1 .  diclofenac sodium (VOLTAREN) 1 % GEL, Apply 2 g topically 4 (four) times daily., Disp: 100 g, Rfl: 2 .  fluconazole (DIFLUCAN) 150 MG tablet, Take 1 tablet (150 mg total) by mouth once [name] week., Disp: 3 tablet, Rfl: 1 .  ketoconazole (NIZORAL) 2 % shampoo, Apply topically 2 (two) times [name] week., Disp: 120 mL, Rfl: 0 .  lisinopril (PRINIVIL,ZESTRIL) 40  MG tablet, Take 1 tablet (40 mg total) by mouth daily., Disp: 90 tablet, Rfl: 3 .  metFORMIN (GLUCOPHAGE) 500 MG tablet, TAKE 2 TABLET BY MOUTH TWICE [name] DAY, Disp: 360 tablet, Rfl: 3 .  methocarbamol (ROBAXIN) 500 MG tablet, Take 1 tablet (500 mg total) by mouth 3 (three) times daily., Disp: 30 tablet, Rfl: 0 .  Omega 3 1000 MG CAPS, Take 1,000 mg by mouth daily. , Disp: , Rfl:  .  ondansetron (ZOFRAN ODT) 4 MG disintegrating tablet, Take 1 tablet (4 mg total) by mouth every 8 (eight) hours as needed., Disp: 20 tablet, Rfl: 0 .  oxyCODONE-acetaminophen (PERCOCET) 5-325 MG tablet, Take 1-2 tablets by mouth every 6 (six) hours as needed., Disp: 15 tablet, Rfl: 0 .  oxymetazoline (AFRIN) 0.05 % nasal spray, Place 1 spray into both nostrils daily as needed for congestion., Disp: , Rfl:  .  predniSONE (DELTASONE) 20 MG tablet, 2 po at same time daily for 5 days, Disp: 10 tablet, Rfl: 0 .  pregabalin (LYRICA) 75 MG capsule, Take 1 capsule (75 mg total) by mouth 2 (two) times daily., Disp: 60 capsule, Rfl: 5  Assessment/ Plan: 58 y.o. male   1. Finger infection We discussed limitations of telephone visit.  He was unable to accommodate [name] virtual visit today.  I have placed him on Keflex 4 times daily for 10  days.  We discussed keeping the area clean with gentle soap and water.  If no significant improvement after 48 hours on antibiotics I would like him to be seen in the office.  May need to consider x-ray to rule out fracture of the finger.  He understands red flag signs and symptoms warranting further evaluation emergency department.  Okay to use home diclofenac if needed for pain and inflammation. - cephALEXin (KEFLEX) 500 MG capsule; Take 1 capsule (500 mg total) by mouth 4 (four) times daily for 10 days.  Dispense: 40 capsule; Refill: 0   Start time: 10:23am End time: 10:35am  Total time spent on patient care (including telephone call/ virtual visit): 19 minutes  [place], [place] [phone]

## 2019-02-17 ENCOUNTER — Ambulatory Visit (INDEPENDENT_AMBULATORY_CARE_PROVIDER_SITE_OTHER): Payer: Medicare HMO | Admitting: Orthopaedic Surgery

## 2019-02-17 ENCOUNTER — Encounter: Payer: Self-pay | Admitting: Orthopaedic Surgery

## 2019-02-17 ENCOUNTER — Other Ambulatory Visit: Payer: Self-pay

## 2019-02-17 ENCOUNTER — Ambulatory Visit (INDEPENDENT_AMBULATORY_CARE_PROVIDER_SITE_OTHER): Payer: Medicare HMO

## 2019-02-17 VITALS — BP 179/105 | HR 77 | Ht 66.0 in | Wt 225.0 lb

## 2019-02-17 DIAGNOSIS — G8929 Other chronic pain: Secondary | ICD-10-CM

## 2019-02-17 DIAGNOSIS — R6889 Other general symptoms and signs: Secondary | ICD-10-CM | POA: Diagnosis not present

## 2019-02-17 DIAGNOSIS — M25561 Pain in right knee: Secondary | ICD-10-CM

## 2019-02-17 MED ORDER — HYDROCODONE-ACETAMINOPHEN 5-325 MG PO TABS: 1.0000 | ORAL_TABLET | Freq: Two times a day (BID) | ORAL | 0 refills | Status: DC | PRN

## 2019-02-17 NOTE — Progress Notes (Addendum)
Office Visit Note   Patient: Alex [name]           Date of Birth: 06/30/1961           MRN: [id number] Visit Date: 02/17/2019              Requested by: Sharion Balloon, [address] [place],  [place] [address] PCP: Dettinger, Fransisca Kaufmann, MD   Assessment & Plan: Visit Diagnoses:  1. Chronic pain of right knee     Plan: Possible loosening of femoral and tibial components of right total knee replacement.  Long discussion over an hour regarding the diagnosis and treatment options.  Will obtain three-phase bone scan.  Has had lab work performed through his primary care physician and an evaluation through another orthopedist office and we will attempt to obtain those records.  We talked for over an hour regarding his initial surgery, his present pain possible origins, treatment options and the need for three-phase bone scan to rule out definitive loosening.  Apparently he has had lab work and a knee aspiration by Dr. Lyla Glassing that was negative for infection.  Also had long discussion regarding pain medicines with limited use.  I have checked PMP and has had pain prescriptions on a chronic basis.  I will need to check his lab work and order appropriate labs if not already performed.  We will try spider brace.  Follow-Up Instructions: Return After three-phase bone scan.   Orders:  Orders Placed This Encounter  Procedures  . XR KNEE 3 VIEW RIGHT  . NM Bone Scan 3 Phase Lower Extremity   Meds ordered this encounter  Medications  . HYDROcodone-acetaminophen (NORCO/VICODIN) 5-325 MG tablet    Sig: Take 1 tablet by mouth 2 (two) times daily as needed for moderate pain.    Dispense:  30 tablet    Refill:  0      Procedures: No procedures performed   Clinical Data: No additional findings.   Subjective: Chief Complaint  Patient presents with  . Right Knee - Pain  Patient presents today for his right knee pain. He said that it has been hurting since November of 2019. He fell  through a ceiling in November. He said that his knee was swollen for awhile after. His pain his located all over, but has pain that radiates from the backside of his knee and up to his buttock. He saw Dr.Swinteck at [facility] and had his knee aspirated. He takes Aleve and Voltaren gel. He said that when he has really bad pain he would like for someone to prescribe him narcotics. [name] had index knee replacement surgery performed approximately 5 years ago in [place].  He had an injury resulting in his present pain about a year and a half ago.  He notes he has had significant pain to the point of compromise.  He has seen Dr. Lyla Glassing who has aspirated his knee.  Apparently the results were negative for infection.  He has  also had lab work through his primary care physician's office.  I tried to retrieve those results but I do not see a sed rate or C-reactive protein.  White cell count was normal. Has history of diabetes.  HPI  Review of Systems   Objective: Vital Signs: BP (!) 179/105   Pulse 77   Ht 5\' 6"  (1.676 m)   Wt 225 lb (102.1 kg)   BMI 36.32 kg/m   Physical Exam Constitutional:      Appearance: He  is well-developed.  Eyes:     Pupils: Pupils are equal, round, and reactive to light.  Pulmonary:     Effort: Pulmonary effort is normal.  Skin:    General: Skin is warm and dry.  Neurological:     Mental Status: He is alert and oriented to person, place, and time.  Psychiatric:        Behavior: Behavior normal.     Ortho Exam right knee with positive effusion.  The knee was minimally warm.  Full extension and flexed about 110 degrees.  Did have some popliteal fullness and probably has a popliteal cyst.  No calf pain.  Good pulses distally.  Motor exam intact.  Straight leg raise negative.  Did open up a little bit medially and laterally with a varus and valgus stress  Specialty Comments:  No specialty comments available.  Imaging: Xr Knee 3 View Right  Result Date:  02/17/2019 Films of the right knee were obtained in several projections.  There is a right total knee replacement with possible loosening of the tibial component associated with large cysts in the proximal lateral tibia tickly anteriorly.  There is some radiolucency along the anterior flange of the femoral component as well    PMFS History: Patient Active Problem List   Diagnosis Date Noted  . Pain in right knee 02/17/2019  . Cervical spine degeneration 12/30/2018  . Right wrist pain 12/19/2018  . Right arm numbness 12/19/2018  . S/P cervical spinal fusion 03/27/2018  . Morbid obesity ([facility]) 02/21/2018  . Diabetes mellitus type 2 in obese ([facility]) 03/28/2017  . Hypertension associated with diabetes ([facility]) 08/25/2016   Past Medical History:  Diagnosis Date  . Diabetes mellitus without complication ([facility])   . History of kidney stones    removed 5 months ago  . Hypertension   . PONV (postoperative nausea and vomiting)    AGITATION after anesthesia as well  . Sleep apnea    NO CPAP    Family History  Problem Relation Age of Onset  . Cancer Mother   . Diabetes Father   . Hypertension Father   . Heart attack Father     Past Surgical History:  Procedure Laterality Date  . ANTERIOR CERVICAL DECOMP/DISCECTOMY FUSION N/A 03/27/2018   Procedure: ANTERIOR CERVICAL DECOMPRESSION/DISCECTOMY FUSION C3-5;  Surgeon: Venita LickBrooks, Dahari, MD;  [facility];  Service: Orthopedics;  Laterality: N/A;  3.5 hrs  . arm surgery Right    x2 involving tendons/ulnar nerve  . BACK SURGERY    . CARPAL TUNNEL RELEASE Right   . clavical surgery    . EYE SURGERY Bilateral    glaucoma  . HERNIA REPAIR    . JOINT REPLACEMENT Right 06/2011  . TRICEPS TENDON REPAIR     Social History   Occupational History  . Not on file  Tobacco Use  . Smoking status: Never Smoker  . Smokeless tobacco: Current User    Types: Chew  Substance and Sexual Activity  . Alcohol use: No  . Drug use: No  . Sexual activity: Not  on file

## 2019-02-25 ENCOUNTER — Encounter (HOSPITAL_COMMUNITY): Payer: Medicare HMO

## 2019-02-26 ENCOUNTER — Encounter (HOSPITAL_COMMUNITY)
Admission: RE | Admit: 2019-02-26 | Discharge: 2019-02-26 | Disposition: A | Discharge status: Home / Self Care | Payer: Medicare HMO | Source: Ambulatory Visit | Attending: Orthopaedic Surgery | Admitting: Orthopaedic Surgery

## 2019-02-26 ENCOUNTER — Other Ambulatory Visit: Payer: Self-pay

## 2019-02-26 DIAGNOSIS — G8929 Other chronic pain: Secondary | ICD-10-CM | POA: Diagnosis not present

## 2019-02-26 DIAGNOSIS — M25561 Pain in right knee: Secondary | ICD-10-CM | POA: Diagnosis not present

## 2019-02-26 DIAGNOSIS — M5030 Other cervical disc degeneration, unspecified cervical region: Secondary | ICD-10-CM | POA: Diagnosis not present

## 2019-02-26 DIAGNOSIS — R6889 Other general symptoms and signs: Secondary | ICD-10-CM | POA: Diagnosis not present

## 2019-02-26 MED ORDER — TECHNETIUM TC 99M MEDRONATE IV KIT
20.0000 | PACK | Freq: Once | INTRAVENOUS | Status: AC | PRN
  Administered 2019-02-26: 20 via INTRAVENOUS

## 2019-03-03 ENCOUNTER — Other Ambulatory Visit: Payer: Self-pay

## 2019-03-03 ENCOUNTER — Encounter: Payer: Self-pay | Admitting: Orthopaedic Surgery

## 2019-03-03 ENCOUNTER — Ambulatory Visit (INDEPENDENT_AMBULATORY_CARE_PROVIDER_SITE_OTHER): Payer: Medicare HMO | Admitting: Orthopaedic Surgery

## 2019-03-03 VITALS — BP 155/100 | HR 68 | Ht 66.0 in | Wt 225.0 lb

## 2019-03-03 DIAGNOSIS — G8929 Other chronic pain: Secondary | ICD-10-CM

## 2019-03-03 DIAGNOSIS — M25561 Pain in right knee: Secondary | ICD-10-CM | POA: Diagnosis not present

## 2019-03-03 DIAGNOSIS — B999 Unspecified infectious disease: Secondary | ICD-10-CM

## 2019-03-03 MED ORDER — HYDROCODONE-ACETAMINOPHEN 5-325 MG PO TABS: 1.0000 | ORAL_TABLET | Freq: Three times a day (TID) | ORAL | 0 refills | Status: DC | PRN

## 2019-03-03 NOTE — Progress Notes (Signed)
Office Visit Note   Patient: Alex [name]           Date of Birth: [birth year]           MRN: [id number] Visit Date: 03/03/2019              Requested by: Worthy Rancher, MD [place],  [place] [address] PCP: Dettinger, Fransisca Kaufmann, MD   Assessment & Plan: Visit Diagnoses:  1. Infection   2. Chronic pain of right knee     Plan: Three-phase bone scan demonstrates increased blood flow and blood pool tracer at the right knee consistent with hyperemia and probable synovitis.  There is abnormal delayed uptake adjacent to the tibial component of the right knee prosthesis with that probably represents loosening.  I do not think his knee is infected but I will order CBC, sed rate and C-reactive protein.  I have not aspirated his knee but would consider doing that procedure should any of the above lab studies be abnormal.  Long discussion over 30 minutes regarding the findings and probable consequence i.e. revision of his prosthesis.  Renew hydrocodone  Follow-Up Instructions: Return Will refer to Dr. Ninfa Linden for revision of right total knee replacement.   Orders:  Orders Placed This Encounter  Procedures  . Sedimentation rate  . C-reactive protein  . CBC With Differential   No orders of the defined types were placed in this encounter.     Procedures: No procedures performed   Clinical Data: No additional findings.   Subjective: Chief Complaint  Patient presents with  . Right Knee - Follow-up  Patient presents today for a two week follow up on his right knee pain. He had a bone scan on 02/26/2019 and is here today for those results. Patient states that there are no changes since his last visit.  He is 5 years status post primary right total knee replacement performed in [place].  He does wear the spider brace which makes a difference in his stability. We have received records from [facility] and will scanned him to the chart HPI  Review of Systems    Objective: Vital Signs: BP (!) 155/100   Pulse 68   Ht 5\' 6"  (1.676 m)   Wt 225 lb (102.1 kg)   BMI 36.32 kg/m   Physical Exam  Ortho Exam  Specialty Comments:  No specialty comments available.  Imaging: No results found.   PMFS History: Patient Active Problem List   Diagnosis Date Noted  . Pain in right knee 02/17/2019  . Cervical spine degeneration 12/30/2018  . Right wrist pain 12/19/2018  . Right arm numbness 12/19/2018  . S/P cervical spinal fusion 03/27/2018  . Morbid obesity ([place]) 02/21/2018  . Diabetes mellitus type 2 in obese ([place]) 03/28/2017  . Hypertension associated with diabetes ([place]) 08/25/2016   Past Medical History:  Diagnosis Date  . Diabetes mellitus without complication ([place])   . History of kidney stones    removed 5 months ago  . Hypertension   . PONV (postoperative nausea and vomiting)    AGITATION after anesthesia as well  . Sleep apnea    NO CPAP    Family History  Problem Relation Age of Onset  . Cancer Mother   . Diabetes Father   . Hypertension Father   . Heart attack Father     Past Surgical History:  Procedure Laterality Date  . ANTERIOR CERVICAL DECOMP/DISCECTOMY FUSION N/A 03/27/2018   Procedure: ANTERIOR CERVICAL DECOMPRESSION/DISCECTOMY  FUSION C3-5;  Surgeon: Venita LickBrooks, Dahari, MD;  [facility];  Service: Orthopedics;  Laterality: N/A;  3.5 hrs  . arm surgery Right    x2 involving tendons/ulnar nerve  . BACK SURGERY    . CARPAL TUNNEL RELEASE Right   . clavical surgery    . EYE SURGERY Bilateral    glaucoma  . HERNIA REPAIR    . JOINT REPLACEMENT Right 06/2011  . TRICEPS TENDON REPAIR     Social History   Occupational History  . Not on file  Tobacco Use  . Smoking status: Never Smoker  . Smokeless tobacco: Current User    Types: Chew  Substance and Sexual Activity  . Alcohol use: No  . Drug use: No  . Sexual activity: Not on file

## 2019-03-04 ENCOUNTER — Other Ambulatory Visit: Payer: Self-pay | Admitting: Family Medicine

## 2019-03-04 ENCOUNTER — Other Ambulatory Visit: Payer: Self-pay

## 2019-03-04 ENCOUNTER — Other Ambulatory Visit: Payer: Medicare HMO

## 2019-03-04 DIAGNOSIS — L219 Seborrheic dermatitis, unspecified: Secondary | ICD-10-CM

## 2019-03-04 DIAGNOSIS — B999 Unspecified infectious disease: Secondary | ICD-10-CM | POA: Diagnosis not present

## 2019-03-05 LAB — C-REACTIVE PROTEIN: CRP: 4 mg/L (ref 0–10)

## 2019-03-05 LAB — SEDIMENTATION RATE: Sed Rate: 4 mm/hr (ref 0–30)

## 2019-03-05 LAB — CBC WITH DIFFERENTIAL
Basophils Absolute: 0 10*3/uL (ref 0.0–0.2)
Basos: 1 %
EOS (ABSOLUTE): 0.2 10*3/uL (ref 0.0–0.4)
Eos: 3 %
Hematocrit: 35.6 % — ABNORMAL LOW (ref 37.5–51.0)
Hemoglobin: 12.6 g/dL — ABNORMAL LOW (ref 13.0–17.7)
Immature Grans (Abs): 0 10*3/uL (ref 0.0–0.1)
Immature Granulocytes: 0 %
Lymphocytes Absolute: 2.5 10*3/uL (ref 0.7–3.1)
Lymphs: 36 %
MCH: 29.4 pg (ref 26.6–33.0)
MCHC: 35.4 g/dL (ref 31.5–35.7)
MCV: 83 fL (ref 79–97)
Monocytes Absolute: 0.5 10*3/uL (ref 0.1–0.9)
Monocytes: 7 %
Neutrophils Absolute: 3.7 10*3/uL (ref 1.4–7.0)
Neutrophils: 53 %
RBC: 4.28 x10E6/uL (ref 4.14–5.80)
RDW: 13.6 % (ref 11.6–15.4)
WBC: 6.9 10*3/uL (ref 3.4–10.8)

## 2019-03-09 ENCOUNTER — Encounter: Payer: Self-pay | Admitting: Orthopaedic Surgery

## 2019-03-09 ENCOUNTER — Ambulatory Visit (INDEPENDENT_AMBULATORY_CARE_PROVIDER_SITE_OTHER): Payer: Medicare HMO | Admitting: Orthopaedic Surgery

## 2019-03-09 ENCOUNTER — Other Ambulatory Visit: Payer: Self-pay

## 2019-03-09 DIAGNOSIS — R6889 Other general symptoms and signs: Secondary | ICD-10-CM | POA: Diagnosis not present

## 2019-03-09 DIAGNOSIS — T84032D Mechanical loosening of internal right knee prosthetic joint, subsequent encounter: Secondary | ICD-10-CM | POA: Diagnosis not present

## 2019-03-09 DIAGNOSIS — T84032A Mechanical loosening of internal right knee prosthetic joint, initial encounter: Secondary | ICD-10-CM | POA: Insufficient documentation

## 2019-03-09 MED ORDER — HYDROCODONE-ACETAMINOPHEN 5-325 MG PO TABS: 1.0000 | ORAL_TABLET | Freq: Three times a day (TID) | ORAL | 0 refills | Status: DC | PRN

## 2019-03-09 MED ORDER — TIZANIDINE HCL 4 MG PO TABS: 4.0000 mg | ORAL_TABLET | Freq: Two times a day (BID) | ORAL | 0 refills | Status: DC | PRN
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
## 2019-03-10 ENCOUNTER — Emergency Department (HOSPITAL_COMMUNITY)
Admission: EM | Admit: 2019-03-10 | Discharge: 2019-03-10 | Disposition: A | Discharge status: Home / Self Care | Payer: Medicare HMO | Attending: Emergency Medicine | Admitting: Emergency Medicine

## 2019-03-10 ENCOUNTER — Other Ambulatory Visit: Payer: Self-pay

## 2019-03-10 ENCOUNTER — Encounter (HOSPITAL_COMMUNITY): Payer: Self-pay | Admitting: Emergency Medicine

## 2019-03-10 ENCOUNTER — Emergency Department (HOSPITAL_COMMUNITY): Payer: Medicare HMO

## 2019-03-10 ENCOUNTER — Ambulatory Visit: Payer: Medicare HMO

## 2019-03-10 DIAGNOSIS — N5089 Other specified disorders of the male genital organs: Secondary | ICD-10-CM | POA: Insufficient documentation

## 2019-03-10 DIAGNOSIS — R590 Localized enlarged lymph nodes: Secondary | ICD-10-CM | POA: Diagnosis not present

## 2019-03-10 DIAGNOSIS — R5381 Other malaise: Secondary | ICD-10-CM | POA: Diagnosis not present

## 2019-03-10 DIAGNOSIS — R600 Localized edema: Secondary | ICD-10-CM | POA: Insufficient documentation

## 2019-03-10 DIAGNOSIS — Z7984 Long term (current) use of oral hypoglycemic drugs: Secondary | ICD-10-CM | POA: Diagnosis not present

## 2019-03-10 DIAGNOSIS — E119 Type 2 diabetes mellitus without complications: Secondary | ICD-10-CM | POA: Diagnosis not present

## 2019-03-10 DIAGNOSIS — I10 Essential (primary) hypertension: Secondary | ICD-10-CM | POA: Insufficient documentation

## 2019-03-10 DIAGNOSIS — Z87442 Personal history of urinary calculi: Secondary | ICD-10-CM | POA: Insufficient documentation

## 2019-03-10 DIAGNOSIS — N5082 Scrotal pain: Secondary | ICD-10-CM | POA: Diagnosis not present

## 2019-03-10 DIAGNOSIS — Z79899 Other long term (current) drug therapy: Secondary | ICD-10-CM | POA: Diagnosis not present

## 2019-03-10 DIAGNOSIS — K4090 Unilateral inguinal hernia, without obstruction or gangrene, not specified as recurrent: Secondary | ICD-10-CM | POA: Insufficient documentation

## 2019-03-10 DIAGNOSIS — R609 Edema, unspecified: Secondary | ICD-10-CM | POA: Diagnosis not present

## 2019-03-10 DIAGNOSIS — R52 Pain, unspecified: Secondary | ICD-10-CM | POA: Diagnosis not present

## 2019-03-10 DIAGNOSIS — N50819 Testicular pain, unspecified: Secondary | ICD-10-CM | POA: Diagnosis present

## 2019-03-10 DIAGNOSIS — N433 Hydrocele, unspecified: Secondary | ICD-10-CM | POA: Diagnosis not present

## 2019-03-10 LAB — CBC WITH DIFFERENTIAL/PLATELET
Abs Immature Granulocytes: 0.01 10*3/uL (ref 0.00–0.07)
Basophils Absolute: 0 10*3/uL (ref 0.0–0.1)
Basophils Relative: 0 %
Eosinophils Absolute: 0.2 10*3/uL (ref 0.0–0.5)
Eosinophils Relative: 3 %
HCT: 37 % — ABNORMAL LOW (ref 39.0–52.0)
Hemoglobin: 12.3 g/dL — ABNORMAL LOW (ref 13.0–17.0)
Immature Granulocytes: 0 %
Lymphocytes Relative: 38 %
Lymphs Abs: 3.2 10*3/uL (ref 0.7–4.0)
MCH: 29.7 pg (ref 26.0–34.0)
MCHC: 33.2 g/dL (ref 30.0–36.0)
MCV: 89.4 fL (ref 80.0–100.0)
Monocytes Absolute: 0.7 10*3/uL (ref 0.1–1.0)
Monocytes Relative: 9 %
Neutro Abs: 4.1 10*3/uL (ref 1.7–7.7)
Neutrophils Relative %: 50 %
Platelets: 196 10*3/uL (ref 150–400)
RBC: 4.14 MIL/uL — ABNORMAL LOW (ref 4.22–5.81)
RDW: 14.2 % (ref 11.5–15.5)
WBC: 8.3 10*3/uL (ref 4.0–10.5)
nRBC: 0 % (ref 0.0–0.2)

## 2019-03-10 LAB — BASIC METABOLIC PANEL
Anion gap: 7 (ref 5–15)
BUN: 16 mg/dL (ref 6–20)
CO2: 26 mmol/L (ref 22–32)
Calcium: 8.6 mg/dL — ABNORMAL LOW (ref 8.9–10.3)
Chloride: 106 mmol/L (ref 98–111)
Creatinine, Ser: 0.88 mg/dL (ref 0.61–1.24)
GFR calc Af Amer: >60 mL/min (ref >60)
GFR calc non Af Amer: >60 mL/min (ref >60)
Glucose, Bld: 94 mg/dL (ref 70–99)
Potassium: 3.8 mmol/L (ref 3.5–5.1)
Sodium: 139 mmol/L (ref 135–145)

## 2019-03-10 LAB — URINALYSIS, ROUTINE W REFLEX MICROSCOPIC
Bilirubin Urine: NEGATIVE
Glucose, UA: NEGATIVE mg/dL
Hgb urine dipstick: NEGATIVE
Ketones, ur: NEGATIVE mg/dL
Leukocytes,Ua: NEGATIVE
Nitrite: NEGATIVE
Protein, ur: NEGATIVE mg/dL
Specific Gravity, Urine: 1.009 (ref 1.005–1.030)
pH: 5 (ref 5.0–8.0)

## 2019-03-10 MED ORDER — HYDROMORPHONE HCL 1 MG/ML IJ SOLN
1.0000 mg | Freq: Once | INTRAMUSCULAR | Status: AC
  Administered 2019-03-10: 1 mg via INTRAVENOUS
  Filled 2019-03-10: qty 1

## 2019-03-10 MED ORDER — SULFAMETHOXAZOLE-TRIMETHOPRIM 800-160 MG PO TABS: 1.0000 | ORAL_TABLET | Freq: Two times a day (BID) | ORAL | 0 refills | Status: AC

## 2019-03-10 MED ORDER — OXYCODONE-ACETAMINOPHEN 5-325 MG PO TABS
2.0000 | ORAL_TABLET | Freq: Once | ORAL | Status: AC
  Administered 2019-03-10: 2 via ORAL
  Filled 2019-03-10: qty 2

## 2019-03-10 MED ORDER — SODIUM CHLORIDE 0.9 % IV SOLN
1.0000 g | Freq: Once | INTRAVENOUS | Status: AC
  Administered 2019-03-10: 1 g via INTRAVENOUS
  Filled 2019-03-10: qty 10

## 2019-03-10 MED ORDER — IOHEXOL 300 MG/ML  SOLN
100.0000 mL | Freq: Once | INTRAMUSCULAR | Status: AC | PRN
  Administered 2019-03-10: 100 mL via INTRAVENOUS

## 2019-03-10 NOTE — ED Notes (Signed)
CT dept notified to call in u/s to rule out torsion

## 2019-03-10 NOTE — ED Triage Notes (Signed)
Pt here by [facility] for testicular pain x 4 days with swelling, pt hypertensive upon EMS arrival at 190/86

## 2019-03-10 NOTE — ED Provider Notes (Signed)
[facility] EMERGENCY DEPARTMENT Provider Note   CSN: [id number] Arrival date & time: 03/10/19  2027     History   Chief Complaint Chief Complaint  Patient presents with  . Testicle Pain    HPI Alex [name] is a 58 y.o. male.  58 year old male history of diabetes here with 4 days of progressively worsening testicular pain.  He called his PCP today and was told to come to the emergency department.  He does not recall any trauma.  No urinary symptoms no vomiting no diarrhea.  No prior history of same.  No fevers or chills.     The history is provided by the patient.  Testicle Pain This is a new problem. The current episode started more than 2 days ago. The problem occurs constantly. The problem has been gradually worsening. Pertinent negatives include no chest pain, no abdominal pain, no headaches and no shortness of breath. Nothing aggravates the symptoms. Nothing relieves the symptoms. He has tried nothing for the symptoms. The treatment provided no relief.    Past Medical History:  Diagnosis Date  . Diabetes mellitus without complication (HCC)   . History of kidney stones    removed 5 months ago  . Hypertension   . PONV (postoperative nausea and vomiting)    AGITATION after anesthesia as well  . Sleep apnea    NO CPAP    Patient Active Problem List   Diagnosis Date Noted  . Loose right total knee arthroplasty ([facility]) 03/09/2019  . Pain in right knee 02/17/2019  . Cervical spine degeneration 12/30/2018  . Right wrist pain 12/19/2018  . Right arm numbness 12/19/2018  . S/P cervical spinal fusion 03/27/2018  . Morbid obesity ([facility]CC) 02/21/2018  . Diabetes mellitus type 2 in obese (HCC) 03/28/2017  . Hypertension associated with diabetes ([facility]) 08/25/2016    Past Surgical History:  Procedure Laterality Date  . ANTERIOR CERVICAL DECOMP/DISCECTOMY FUSION N/A 03/27/2018   Procedure: ANTERIOR CERVICAL DECOMPRESSION/DISCECTOMY FUSION C3-5;  Surgeon: Venita LickBrooks, Dahari, MD;   [facility];  Service: Orthopedics;  Laterality: N/A;  3.5 hrs  . arm surgery Right    x2 involving tendons/ulnar nerve  . BACK SURGERY    . CARPAL TUNNEL RELEASE Right   . clavical surgery    . EYE SURGERY Bilateral    glaucoma  . HERNIA REPAIR    . JOINT REPLACEMENT Right 06/2011  . TRICEPS TENDON REPAIR          Home Medications    Prior to Admission medications   Medication Sig Start Date End Date Taking? Authorizing Provider  amLODipine (NORVASC) 5 MG tablet Take 1 tablet (5 mg total) by mouth daily. 09/26/18   Dettinger, Elige RadonJoshua A, MD  carvedilol (COREG) 6.25 MG tablet Take 1 tablet (6.25 mg total) by mouth 2 (two) times daily. 09/26/18   Dettinger, Elige RadonJoshua A, MD  diclofenac (VOLTAREN) 75 MG EC tablet TAKE (1) TABLET TWICE A DAY. 11/03/18   Dettinger, Elige RadonJoshua A, MD  diclofenac sodium (VOLTAREN) 1 % GEL Apply 2 g topically 4 (four) times daily. 06/25/18   Dettinger, Elige RadonJoshua A, MD  fluconazole (DIFLUCAN) 150 MG tablet Take 1 tablet (150 mg total) by mouth once a week. 01/16/19   Dettinger, Elige RadonJoshua A, MD  HYDROcodone-acetaminophen (NORCO/VICODIN) 5-325 MG tablet Take 1 tablet by mouth 3 (three) times daily as needed for moderate pain. 03/09/19   Kathryne HitchBlackman, Christopher Y, MD  ketoconazole (NIZORAL) 2 % shampoo Apply topically 2 (two) times a week. 03/04/19   Dettinger,  Fransisca Kaufmann, MD  lisinopril (PRINIVIL,ZESTRIL) 40 MG tablet Take 1 tablet (40 mg total) by mouth daily. 09/26/18   Dettinger, Fransisca Kaufmann, MD  metFORMIN (GLUCOPHAGE) 500 MG tablet TAKE 2 TABLET BY MOUTH TWICE A DAY 09/26/18   Dettinger, Fransisca Kaufmann, MD  methocarbamol (ROBAXIN) 500 MG tablet Take 1 tablet (500 mg total) by mouth 3 (three) times daily. 03/27/18   Mayo, Carmen Christina, PA-C  Omega 3 1000 MG CAPS Take 1,000 mg by mouth daily.     [provider]  ondansetron (ZOFRAN ODT) 4 MG disintegrating tablet Take 1 tablet (4 mg total) by mouth every 8 (eight) hours as needed. 03/27/18   Mayo, Darla Lesches, PA-C   oxyCODONE-acetaminophen (PERCOCET) 5-325 MG tablet Take 1-2 tablets by mouth every 6 (six) hours as needed. 12/19/18   Veryl Speak, MD  oxymetazoline (AFRIN) 0.05 % nasal spray Place 1 spray into both nostrils daily as needed for congestion.    [provider]  pregabalin (LYRICA) 75 MG capsule Take 1 capsule (75 mg total) by mouth 2 (two) times daily. 09/26/18   Dettinger, Fransisca Kaufmann, MD  tiZANidine (ZANAFLEX) 4 MG tablet Take 1 tablet (4 mg total) by mouth 2 (two) times daily as needed for muscle spasms. 03/09/19   Mcarthur Rossetti, MD    Family History Family History  Problem Relation Age of Onset  . Cancer Mother   . Diabetes Father   . Hypertension Father   . Heart attack Father     Social History Social History   Tobacco Use  . Smoking status: Never Smoker  . Smokeless tobacco: Current User    Types: Chew  Substance Use Topics  . Alcohol use: No  . Drug use: No     Allergies   Morphine and related, Buprenorphine hcl-naloxone hcl, and Codeine   Review of Systems Review of Systems  Constitutional: Negative for fever.  HENT: Negative for sore throat.   Eyes: Negative for visual disturbance.  Respiratory: Negative for shortness of breath.   Cardiovascular: Negative for chest pain.  Gastrointestinal: Negative for abdominal pain.  Genitourinary: Positive for scrotal swelling and testicular pain. Negative for dysuria.  Musculoskeletal: Negative for neck pain.  Skin: Negative for rash.  Neurological: Negative for headaches.     Physical Exam Updated Vital Signs BP (!) 170/103 (BP Location: Right Arm)   Pulse 71   Temp 98.5 F (36.9 C) (Oral)   Resp 17   Ht 5\' 6"  (1.676 m)   Wt 102.1 kg   SpO2 99%   BMI 36.32 kg/m   Physical Exam Vitals signs and nursing note reviewed. Exam conducted with a chaperone present.  Constitutional:      Appearance: He is well-developed.  HENT:     Head: Normocephalic and atraumatic.  Eyes:      Conjunctiva/sclera: Conjunctivae normal.  Neck:     Musculoskeletal: Neck supple.  Cardiovascular:     Rate and Rhythm: Normal rate and regular rhythm.     Heart sounds: No murmur.  Pulmonary:     Effort: Pulmonary effort is normal. No respiratory distress.     Breath sounds: Normal breath sounds.  Abdominal:     Palpations: Abdomen is soft.     Tenderness: There is no abdominal tenderness.  Genitourinary:    Penis: Normal.      Scrotum/Testes:        Right: Tenderness present.        Left: Tenderness present.     Comments: Patient  has diffuse tenderness throughout his scrotal sac and testicles although they do not feel particularly enlarged or have an abnormal lie.  There is no scrotal thickening or erythema.  No hernia appreciated. Musculoskeletal:     Right lower leg: No edema.     Left lower leg: No edema.  Skin:    General: Skin is warm and dry.     Capillary Refill: Capillary refill takes less than 2 seconds.  Neurological:     General: No focal deficit present.     Mental Status: He is alert.      ED Treatments / Results  Labs (all labs ordered are listed, but only abnormal results are displayed) Labs Reviewed  BASIC METABOLIC PANEL - Abnormal; Notable for the following components:      Result Value   Calcium 8.6 (*)    All other components within normal limits  CBC WITH DIFFERENTIAL/PLATELET - Abnormal; Notable for the following components:   RBC 4.14 (*)    Hemoglobin 12.3 (*)    HCT 37.0 (*)    All other components within normal limits  URINALYSIS, ROUTINE W REFLEX MICROSCOPIC - Abnormal; Notable for the following components:   Color, Urine STRAW (*)    All other components within normal limits    EKG None  Radiology Ct Pelvis W Contrast  Result Date: 03/10/2019 CLINICAL DATA:  Scrotal pain.  Testicular pain. EXAM: CT PELVIS WITH CONTRAST TECHNIQUE: Multidetector CT imaging of the pelvis was performed using the standard protocol following the bolus  administration of intravenous contrast. CONTRAST:  OMNIPAQUE IOHEXOL 300 MG/ML  SOLN COMPARISON:  Ultrasound from same day. FINDINGS: Urinary Tract:  No abnormality visualized. Bowel:  Unremarkable visualized pelvic bowel loops. Vascular/Lymphatic: There are atherosclerotic changes of the abdominal aorta. The right common iliac artery is ectatic measuring approximately 1.9 cm. The left common iliac artery is mildly dilated measuring approximately 1.4 cm. Reproductive: There are again noted small bilateral hydroceles. There is some mild scrotal wall edema as seen on prior ultrasound. There may be some mild fat stranding involving the fat extending into the inguinal canal on the right. Other: There is a fat containing left inguinal hernia. There are few prominent inferior mesenteric lymph nodes of unknown clinical significance. There are no pathologically enlarged inguinal lymph nodes. Musculoskeletal: Patient is status post prior left SI joint fixation. IMPRESSION: 1. Again noted are small bilateral hydroceles and generalized scrotal wall edema. These findings are nonspecific and should be correlated with physical exam. 2. Possible mild fat stranding of the fat within the right inguinal canal. This is nonspecific but can be seen in patients with vasitis. 3. Fat containing left inguinal hernia. 4. Ectatic bilateral common iliac arteries. Atherosclerotic disease is noted involving the distal abdominal aorta. Electronically Signed   By: Katherine Mantle M.D.   On: 03/10/2019 22:55   US Scrotum W/doppler  Result Date: 03/10/2019 CLINICAL DATA:  58 year old male with scrotal pain and swelling 4 days. EXAM: SCROTAL ULTRASOUND DOPPLER ULTRASOUND OF THE TESTICLES TECHNIQUE: Complete ultrasound examination of the testicles, epididymis, and other scrotal structures was performed. Color and spectral Doppler ultrasound were also utilized to evaluate blood flow to the testicles. COMPARISON:  None. FINDINGS: Right  testicle Measurements: 4.9 x 2.6 x 3.6 cm. No mass or microlithiasis visualized. Left testicle Measurements: 4.4 x 2.6 x 3.5 cm. No mass or microlithiasis visualized. Right epididymis:  Normal in size and appearance. Left epididymis:  Normal in size and appearance. Hydrocele:  Small bilateral hydroceles are  noted. Varicocele:  None visualized. Pulsed Doppler interrogation of both testes demonstrates normal low resistance arterial and venous waveforms bilaterally. Bilateral scrotal wall edema noted. IMPRESSION: 1. Normal testicles.  No evidence of testicular torsion or mass. 2. Small bilateral hydroceles and scrotal wall edema. Electronically Signed   By: Harmon PierJeffrey  Hu M.D.   On: 03/10/2019 21:44    Procedures Procedures (including critical care time)  Medications Ordered in ED Medications  HYDROmorphone (DILAUDID) injection 1 mg (1 mg Intravenous Given 03/10/19 2055)  HYDROmorphone (DILAUDID) injection 1 mg (1 mg Intravenous Given 03/10/19 2151)  cefTRIAXone (ROCEPHIN) 1 g in sodium chloride 0.9 % 100 mL IVPB (0 g Intravenous Stopped 03/10/19 2246)  iohexol (OMNIPAQUE) 300 MG/ML solution 100 mL (100 mLs Intravenous Contrast Given 03/10/19 2223)  oxyCODONE-acetaminophen (PERCOCET/ROXICET) 5-325 MG per tablet 2 tablet (2 tablets Oral Given 03/10/19 2325)     Initial Impression / Assessment and Plan / ED Course  I have reviewed the triage vital signs and the nursing notes.  Pertinent labs & imaging results that were available during my care of the patient were reviewed by me and considered in my medical decision making (see chart for details).  Clinical Course as of Mar 10 1004  Tue Mar 10, 2019  [id number] 58 year old male here with bilateral testicular pain.  His exam is not consistent with torsion although he is diffusely tender through the scrotum.  No obvious hernia palpated.  I put him in for a testicular ultrasound and getting some screening labs.   [MB]  2150 Patient's ultrasound showed some scrotal wall  thickening but no obvious abscess and normal testicles and epididymis.  He is got no white count and no fever here either.  Consideration for fourniers although it is been 4 days and I do not see a lot of objective findings.  Likely cover on antibiotics need home close follow-up with PCP and urology.    [MB]    Clinical Course User Index [MB] Terrilee FilesButler,  C, MD        Final Clinical Impressions(s) / ED Diagnoses   Final diagnoses:  Scrotal pain    ED Discharge Orders         Ordered    sulfamethoxazole-trimethoprim (BACTRIM DS) 800-160 MG tablet  2 times daily     03/10/19 2309           Terrilee FilesButler,  C, MD 03/11/19 1005

## 2019-03-10 NOTE — Discharge Instructions (Addendum)
You were seen in the emergency department for scrotal swelling and pain.  This is likely an infection and will need to be treated with antibiotics.  It will be important for you to schedule a follow-up with urology.  Return to the emergency department if any fevers or other concerns.

## 2019-03-12 ENCOUNTER — Telehealth: Payer: Self-pay | Admitting: Family Medicine

## 2019-03-12 MED ORDER — ONDANSETRON 4 MG PO TBDP: 4.0000 mg | ORAL_TABLET | Freq: Three times a day (TID) | ORAL | 0 refills | Status: DC | PRN

## 2019-03-12 NOTE — Telephone Encounter (Signed)
No answer, no voicemail.

## 2019-03-12 NOTE — Telephone Encounter (Signed)
Patient was seen in ER on 03/11/2019 for scrotal pain and tenderness.  They are treating him for prostatitis and was given Bactrim.  Patient states this medication causes him to be nauseous even when he takes it on a full stomach.  He is wondering if possibly you would send in Keflex instead.  He has taken this in the past and had no problem.  Also, he wanted to let you know he is having knee replacement on 04/10/2019.

## 2019-03-12 NOTE — Telephone Encounter (Signed)
Keflex would not cover well. Bactrim is better, if he needs you can send him zofran 4mg  odt prn#[id number] no refill

## 2019-03-12 NOTE — Telephone Encounter (Signed)
Patient aware will get nurse to send in.

## 2019-03-17 ENCOUNTER — Ambulatory Visit: Payer: Medicare HMO | Admitting: Orthopaedic Surgery

## 2019-03-17 ENCOUNTER — Ambulatory Visit: Payer: Medicare HMO

## 2019-03-23 ENCOUNTER — Telehealth: Payer: Self-pay | Admitting: Orthopaedic Surgery

## 2019-03-23 NOTE — Telephone Encounter (Signed)
There is nothing he can really do except for staying off the knee as much as possible as well as using a cane in his opposite hand.  Using a cane in his opposite hand will take more of the weight off and pressure off the knee and this will help decrease his pain.

## 2019-03-23 NOTE — Telephone Encounter (Signed)
Patient called stating that he is trying to keep his knee pain down by putting pain cream on it and icing it.  Patient is taking the RX pain medication along with Aleeve which is giving him relief for about 3 hours.  He does not want anything stronger, but wanted to know what else he could do to get some relief.  CB#[phone].  Thank you.

## 2019-03-23 NOTE — Telephone Encounter (Signed)
Please advise 

## 2019-03-23 NOTE — Telephone Encounter (Signed)
Patient aware of the below message from Dr. Blackman  

## 2019-03-24 DIAGNOSIS — R6889 Other general symptoms and signs: Secondary | ICD-10-CM | POA: Diagnosis not present

## 2019-03-24 DIAGNOSIS — M792 Neuralgia and neuritis, unspecified: Secondary | ICD-10-CM | POA: Diagnosis not present

## 2019-03-26 ENCOUNTER — Other Ambulatory Visit: Payer: Self-pay | Admitting: Physician Assistant

## 2019-03-27 ENCOUNTER — Telehealth: Payer: Self-pay | Admitting: Radiology

## 2019-03-27 ENCOUNTER — Other Ambulatory Visit: Payer: Self-pay | Admitting: Orthopaedic Surgery

## 2019-03-27 MED ORDER — HYDROCODONE-ACETAMINOPHEN 5-325 MG PO TABS: 1.0000 | ORAL_TABLET | Freq: Three times a day (TID) | ORAL | 0 refills | Status: DC | PRN

## 2019-03-27 NOTE — Telephone Encounter (Signed)
Patient called and requests refill on hydrocodone 5/325. He will be out on Sunday. Please send to [facility].

## 2019-03-30 ENCOUNTER — Other Ambulatory Visit: Payer: Self-pay

## 2019-03-30 ENCOUNTER — Other Ambulatory Visit: Payer: Self-pay | Admitting: Family Medicine

## 2019-03-30 ENCOUNTER — Other Ambulatory Visit (INDEPENDENT_AMBULATORY_CARE_PROVIDER_SITE_OTHER): Payer: Medicare HMO

## 2019-03-30 DIAGNOSIS — E1142 Type 2 diabetes mellitus with diabetic polyneuropathy: Secondary | ICD-10-CM

## 2019-03-30 DIAGNOSIS — Z23 Encounter for immunization: Secondary | ICD-10-CM | POA: Diagnosis not present

## 2019-03-30 DIAGNOSIS — E669 Obesity, unspecified: Secondary | ICD-10-CM

## 2019-03-30 DIAGNOSIS — E1169 Type 2 diabetes mellitus with other specified complication: Secondary | ICD-10-CM | POA: Diagnosis not present

## 2019-03-30 DIAGNOSIS — I10 Essential (primary) hypertension: Secondary | ICD-10-CM | POA: Diagnosis not present

## 2019-03-30 DIAGNOSIS — E1159 Type 2 diabetes mellitus with other circulatory complications: Secondary | ICD-10-CM | POA: Diagnosis not present

## 2019-03-30 DIAGNOSIS — I152 Hypertension secondary to endocrine disorders: Secondary | ICD-10-CM

## 2019-03-30 LAB — BAYER DCA HB A1C WAIVED: HB A1C (BAYER DCA - WAIVED): 5.6 % (ref <7.0)

## 2019-03-30 NOTE — Progress Notes (Signed)
Placed orders for patient to come do labs prior to visit

## 2019-03-31 LAB — CBC WITH DIFFERENTIAL/PLATELET
Basophils Absolute: 0 10*3/uL (ref 0.0–0.2)
Basos: 1 %
EOS (ABSOLUTE): 0.1 10*3/uL (ref 0.0–0.4)
Eos: 1 %
Hematocrit: 39.8 % (ref 37.5–51.0)
Hemoglobin: 13.4 g/dL (ref 13.0–17.7)
Immature Grans (Abs): 0 10*3/uL (ref 0.0–0.1)
Immature Granulocytes: 0 %
Lymphocytes Absolute: 2.7 10*3/uL (ref 0.7–3.1)
Lymphs: 32 %
MCH: 28.9 pg (ref 26.6–33.0)
MCHC: 33.7 g/dL (ref 31.5–35.7)
MCV: 86 fL (ref 79–97)
Monocytes Absolute: 0.5 10*3/uL (ref 0.1–0.9)
Monocytes: 6 %
Neutrophils Absolute: 5 10*3/uL (ref 1.4–7.0)
Neutrophils: 60 %
Platelets: 211 10*3/uL (ref 150–450)
RBC: 4.63 x10E6/uL (ref 4.14–5.80)
RDW: 13.8 % (ref 11.6–15.4)
WBC: 8.4 10*3/uL (ref 3.4–10.8)

## 2019-03-31 LAB — CMP14+EGFR
ALT: 31 IU/L (ref 0–44)
AST: 19 IU/L (ref 0–40)
Albumin/Globulin Ratio: 2.3 — ABNORMAL HIGH (ref 1.2–2.2)
Albumin: 4.4 g/dL (ref 3.8–4.9)
Alkaline Phosphatase: 78 IU/L (ref 39–117)
BUN/Creatinine Ratio: 25 — ABNORMAL HIGH (ref 9–20)
BUN: 18 mg/dL (ref 6–24)
Bilirubin Total: 0.3 mg/dL (ref 0.0–1.2)
CO2: 20 mmol/L (ref 20–29)
Calcium: 9.2 mg/dL (ref 8.7–10.2)
Chloride: 106 mmol/L (ref 96–106)
Creatinine, Ser: 0.71 mg/dL — ABNORMAL LOW (ref 0.76–1.27)
GFR calc Af Amer: 120 mL/min/{1.73_m2} (ref >59)
GFR calc non Af Amer: 103 mL/min/{1.73_m2} (ref >59)
Globulin, Total: 1.9 g/dL (ref 1.5–4.5)
Glucose: 87 mg/dL (ref 65–99)
Potassium: 4.4 mmol/L (ref 3.5–5.2)
Sodium: 141 mmol/L (ref 134–144)
Total Protein: 6.3 g/dL (ref 6.0–8.5)

## 2019-03-31 LAB — LIPID PANEL
Chol/HDL Ratio: 3.2 ratio (ref 0.0–5.0)
Cholesterol, Total: 149 mg/dL (ref 100–199)
HDL: 47 mg/dL (ref >39)
LDL Chol Calc (NIH): 78 mg/dL (ref 0–99)
Triglycerides: 137 mg/dL (ref 0–149)
VLDL Cholesterol Cal: 24 mg/dL (ref 5–40)

## 2019-04-01 ENCOUNTER — Other Ambulatory Visit: Payer: Self-pay | Admitting: *Deleted

## 2019-04-01 DIAGNOSIS — E1142 Type 2 diabetes mellitus with diabetic polyneuropathy: Secondary | ICD-10-CM

## 2019-04-01 DIAGNOSIS — E1159 Type 2 diabetes mellitus with other circulatory complications: Secondary | ICD-10-CM

## 2019-04-01 DIAGNOSIS — I152 Hypertension secondary to endocrine disorders: Secondary | ICD-10-CM

## 2019-04-01 MED ORDER — LISINOPRIL 40 MG PO TABS: 40.0000 mg | ORAL_TABLET | Freq: Every day | ORAL | 0 refills | Status: DC

## 2019-04-01 MED ORDER — METFORMIN HCL 500 MG PO TABS: ORAL_TABLET | ORAL | 0 refills | Status: DC

## 2019-04-02 ENCOUNTER — Encounter: Payer: Self-pay | Admitting: Family Medicine

## 2019-04-02 ENCOUNTER — Ambulatory Visit (INDEPENDENT_AMBULATORY_CARE_PROVIDER_SITE_OTHER): Payer: Medicare HMO | Admitting: Family Medicine

## 2019-04-02 DIAGNOSIS — E669 Obesity, unspecified: Secondary | ICD-10-CM | POA: Diagnosis not present

## 2019-04-02 DIAGNOSIS — E1142 Type 2 diabetes mellitus with diabetic polyneuropathy: Secondary | ICD-10-CM

## 2019-04-02 DIAGNOSIS — I152 Hypertension secondary to endocrine disorders: Secondary | ICD-10-CM

## 2019-04-02 DIAGNOSIS — E1169 Type 2 diabetes mellitus with other specified complication: Secondary | ICD-10-CM

## 2019-04-02 DIAGNOSIS — E1159 Type 2 diabetes mellitus with other circulatory complications: Secondary | ICD-10-CM

## 2019-04-02 DIAGNOSIS — I10 Essential (primary) hypertension: Secondary | ICD-10-CM

## 2019-04-02 MED ORDER — PREGABALIN 75 MG PO CAPS: 75.0000 mg | ORAL_CAPSULE | Freq: Two times a day (BID) | ORAL | 5 refills | Status: DC | PRN

## 2019-04-02 MED ORDER — LISINOPRIL 40 MG PO TABS: 40.0000 mg | ORAL_TABLET | Freq: Every day | ORAL | 0 refills | Status: DC

## 2019-04-02 MED ORDER — METFORMIN HCL 500 MG PO TABS: ORAL_TABLET | ORAL | 0 refills | Status: DC

## 2019-04-02 NOTE — Progress Notes (Signed)
Virtual Visit via telephone Note  I connected with Alex [name] on 04/02/19 at [place] by telephone and verified that I am speaking with the correct person using two identifiers. Alex [name] is currently located at home and no other people are currently with her during visit. The provider, Fransisca Kaufmann , MD is located in their office at time of visit.  Call ended at 25  I discussed the limitations, risks, security and privacy concerns of performing an evaluation and management service by telephone and the availability of in person appointments. I also discussed with the patient that there may be a patient responsible charge related to this service. The patient expressed understanding and agreed to proceed.   History and Present Illness: Type 2 diabetes mellitus Patient comes in today for recheck of his diabetes. Patient has been currently taking metformin and a1c is 5.6 and is under control. Patient is currently on an ACE inhibitor/ARB. Patient has not seen an ophthalmologist this year. Patient denies any issues with their feet.   Hypertension Patient is currently on lisinopril, and their blood pressure today is unknown. Patient denies any lightheadedness or dizziness. Patient denies headaches, blurred vision, chest pains, shortness of breath, or weakness. Denies any side effects from medication and is content with current medication.   No diagnosis found.  Outpatient Encounter Medications as of 04/02/2019  Medication Sig  . diclofenac sodium (VOLTAREN) 1 % GEL Apply 2 g topically 4 (four) times daily.  . fluconazole (DIFLUCAN) 150 MG tablet Take 1 tablet (150 mg total) by mouth once a week. (Patient not taking: Reported on 03/10/2019)  . HYDROcodone-acetaminophen (NORCO/VICODIN) 5-325 MG tablet Take 1 tablet by mouth 3 (three) times daily as needed for moderate pain.  Marland Kitchen ketoconazole (NIZORAL) 2 % shampoo Apply topically 2 (two) times a week. (Patient taking differently: Apply 1  application topically 2 (two) times a week. )  . lisinopril (ZESTRIL) 40 MG tablet Take 1 tablet (40 mg total) by mouth daily.  . metFORMIN (GLUCOPHAGE) 500 MG tablet TAKE 2 TABLET BY MOUTH TWICE A DAY  . methocarbamol (ROBAXIN) 500 MG tablet Take 1 tablet (500 mg total) by mouth 3 (three) times daily.  . Omega 3 1000 MG CAPS Take 1,000 mg by mouth daily.   . ondansetron (ZOFRAN ODT) 4 MG disintegrating tablet Take 1 tablet (4 mg total) by mouth every 8 (eight) hours as needed for nausea or vomiting.  Marland Kitchen oxymetazoline (AFRIN) 0.05 % nasal spray Place 1 spray into both nostrils every 6 (six) hours as needed for congestion.   . pregabalin (LYRICA) 75 MG capsule Take 1 capsule (75 mg total) by mouth 2 (two) times daily. (Patient taking differently: Take 75 mg by mouth 2 (two) times daily as needed (for neuropathy pain). )  . tiZANidine (ZANAFLEX) 4 MG tablet Take 1 tablet (4 mg total) by mouth 2 (two) times daily as needed for muscle spasms.   No facility-administered encounter medications on file as of 04/02/2019.     Review of Systems  Constitutional: Negative for chills and fever.  Respiratory: Negative for shortness of breath and wheezing.   Cardiovascular: Negative for chest pain and leg swelling.  Musculoskeletal: Negative for back pain and gait problem.  Skin: Negative for rash.  Neurological: Negative for dizziness, weakness and light-headedness.  All other systems reviewed and are negative.   Observations/Objective: Patient sounds comfortable and in no acute distress  Assessment and Plan: Problem List Items Addressed This Visit      Cardiovascular  and Mediastinum   Hypertension associated with diabetes (HCC)   Relevant Medications   lisinopril (ZESTRIL) 40 MG tablet   metFORMIN (GLUCOPHAGE) 500 MG tablet     Endocrine   Diabetes mellitus type 2 in obese (HCC) - Primary   Relevant Medications   lisinopril (ZESTRIL) 40 MG tablet   metFORMIN (GLUCOPHAGE) 500 MG tablet    pregabalin (LYRICA) 75 MG capsule    Other Visit Diagnoses    Type 2 diabetes mellitus with diabetic polyneuropathy, without long-term current use of insulin (HCC)       Relevant Medications   lisinopril (ZESTRIL) 40 MG tablet   metFORMIN (GLUCOPHAGE) 500 MG tablet   pregabalin (LYRICA) 75 MG capsule       Follow Up Instructions: Follow up in 3 months for diabetes recheck and htn  Controlled and no change in medications   I discussed the assessment and treatment plan with the patient. The patient was provided an opportunity to ask questions and all were answered. The patient agreed with the plan and demonstrated an understanding of the instructions.   The patient was advised to call back or seek an in-person evaluation if the symptoms worsen or if the condition fails to improve as anticipated.  The above assessment and management plan was discussed with the patient. The patient verbalized understanding of and has agreed to the management plan. Patient is aware to call the clinic if symptoms persist or worsen. Patient is aware when to return to the clinic for a follow-up visit. Patient educated on when it is appropriate to go to the emergency department.    I provided 18 minutes of non-face-to-face time during this encounter.    Nils Pyle, MD

## 2019-04-03 ENCOUNTER — Other Ambulatory Visit: Payer: Self-pay

## 2019-04-06 ENCOUNTER — Telehealth: Payer: Self-pay | Admitting: Family Medicine

## 2019-04-06 NOTE — Telephone Encounter (Signed)
Okay sounds good, just have them send [place] a copy of it when he does it.

## 2019-04-06 NOTE — Patient Instructions (Addendum)
DUE TO COVID-19 ONLY ONE VISITOR IS ALLOWED TO COME WITH YOU AND STAY IN THE WAITING ROOM ONLY DURING PRE OP AND PROCEDURE DAY OF SURGERY. THE 1 VISITOR MAY VISIT WITH YOU AFTER SURGERY IN YOUR PRIVATE ROOM DURING VISITING HOURS ONLY!  YOU NEED TO HAVE A COVID 19 TEST ON 04-07-19  @ 12:10 PM, THIS TEST MUST BE DONE BEFORE SURGERY, COME  [address], [place] , [address].  [facility]) ONCE YOUR COVID TEST IS COMPLETED, PLEASE BEGIN THE QUARANTINE INSTRUCTIONS AS OUTLINED IN YOUR HANDOUT.                Alex [name]  04/06/2019   Your procedure is scheduled on: 04-10-19    Report to [facility] Main  Entrance    Report to Admitting at 6:00 AM     Call this number if you have problems the morning of surgery [phone]    Remember: AFTER MIDNIGHT THE NIGHT PRIOR TO SURGERY 5:30 AM. NOTHING BY MOUTH EXCEPT CLEAR LIQUIDS UNTIL 5:30 AM. PLEASE FINISH GATORADE 2 DRINK PER SURGEON ORDER  WHICH NEEDS TO BE COMPLETED AT .5:30 AM   CLEAR LIQUID DIET   Foods Allowed                                                                     Foods Excluded  Coffee and tea, regular and decaf                             liquids that you cannot  Plain Jell-O any favor except red or purple                                           see through such as: Fruit ices (not with fruit pulp)                                     milk, soups, orange juice  Iced Popsicles                                    All solid food Carbonated beverages, regular and diet                                    Cranberry, grape and apple juices Sports drinks like Gatorade Lightly seasoned clear broth or consume(fat free) Sugar, honey syrup  _____________________________________________________________________       Take these medicines the morning of surgery with A SIP OF WATER: None. You may use and bring your nasal spray   BRUSH YOUR TEETH MORNING OF SURGERY AND RINSE YOUR MOUTH OUT, NO CHEWING  GUM CANDY OR MINTS.   DO NOT TAKE ANY DIABETIC MEDICATIONS DAY OF YOUR SURGERY  You may not have any metal on your body including hair pins and              piercings                  Men may shave face and neck.   Do not bring valuables to the hospital. [facility] IS NOT             RESPONSIBLE   FOR VALUABLES.  Contacts, dentures or bridgework may not be worn into surgery.  Leave suitcase in the car. After surgery it may be brought to your room.     Special Instructions: N/A              Please read over the following fact sheets you were given: _____________________________________________________________________  How to Manage Your Diabetes Before and After Surgery  Why is it important to control my blood sugar before and after surgery? . Improving blood sugar levels before and after surgery helps healing and can limit problems. . A way of improving blood sugar control is eating a healthy diet by: o  Eating less sugar and carbohydrates o  Increasing activity/exercise o  Talking with your doctor about reaching your blood sugar goals . High blood sugars (greater than 180 mg/dL) can raise your risk of infections and slow your recovery, so you will need to focus on controlling your diabetes during the weeks before surgery. . Make sure that the doctor who takes care of your diabetes knows about your planned surgery including the date and location.  How do I manage my blood sugar before surgery? . Check your blood sugar at least 4 times a day, starting 2 days before surgery, to make sure that the level is not too high or low. o Check your blood sugar the morning of your surgery when you wake up and every 2 hours until you get to the Short Stay unit. . If your blood sugar is less than 70 mg/dL, you will need to treat for low blood sugar: o Do not take insulin. o Treat a low blood sugar (less than 70 mg/dL) with  cup of clear juice (cranberry or  apple), 4 glucose tablets, OR glucose gel. o Recheck blood sugar in 15 minutes after treatment (to make sure it is greater than 70 mg/dL). If your blood sugar is not greater than 70 mg/dL on recheck, call [phone] for further instructions. . Report your blood sugar to the short stay nurse when you get to Short Stay.  . If you are admitted to the hospital after surgery: o Your blood sugar will be checked by the staff and you will probably be given insulin after surgery (instead of oral diabetes medicines) to make sure you have good blood sugar levels. o The goal for blood sugar control after surgery is 80-180 mg/dL.   WHAT DO I DO ABOUT MY DIABETES MEDICATION?  Marland Kitchen Do not take oral diabetes medicines (pills) the morning of surgery.  . THE DAY BEFORE SURGERY, take your usual dose of Metformin       Reviewed and Endorsed by [facility] Patient Education Committee, August 2015           [facility] - Preparing for Surgery Before surgery, you can play an important role.  Because skin is not sterile, your skin needs to be as free of germs as possible.  You can reduce the number of germs on your skin by washing with CHG (chlorahexidine gluconate) soap  before surgery.  CHG is an antiseptic cleaner which kills germs and bonds with the skin to continue killing germs even after washing. Please DO NOT use if you have an allergy to CHG or antibacterial soaps.  If your skin becomes reddened/irritated stop using the CHG and inform your nurse when you arrive at Short Stay. Do not shave (including legs and underarms) for at least 48 hours prior to the first CHG shower.  You may shave your face/neck. Please follow these instructions carefully:  1.  Shower with CHG Soap the night before surgery and the  morning of Surgery.  2.  If you choose to wash your hair, wash your hair first as usual with your  normal  shampoo.  3.  After you shampoo, rinse your hair and body thoroughly to remove the  shampoo.                            4.  Use CHG as you would any other liquid soap.  You can apply chg directly  to the skin and wash                       Gently with a scrungie or clean washcloth.  5.  Apply the CHG Soap to your body ONLY FROM THE NECK DOWN.   Do not use on face/ open                           Wound or open sores. Avoid contact with eyes, ears mouth and genitals (private parts).                       Wash face,  Genitals (private parts) with your normal soap.             6.  Wash thoroughly, paying special attention to the area where your surgery  will be performed.  7.  Thoroughly rinse your body with warm water from the neck down.  8.  DO NOT shower/wash with your normal soap after using and rinsing off  the CHG Soap.                9.  Pat yourself dry with a clean towel.            10.  Wear clean pajamas.            11.  Place clean sheets on your bed the night of your first shower and do not  sleep with pets. Day of Surgery : Do not apply any lotions/deodorants the morning of surgery.  Please wear clean clothes to the hospital/surgery center.  FAILURE TO FOLLOW THESE INSTRUCTIONS MAY RESULT IN THE CANCELLATION OF YOUR SURGERY PATIENT SIGNATURE_________________________________  NURSE SIGNATURE__________________________________  ________________________________________________________________________   Rogelia MireIncentive Spirometer  An incentive spirometer is a tool that can help keep your lungs clear and active. This tool measures how well you are filling your lungs with each breath. Taking long deep breaths may help reverse or decrease the chance of developing breathing (pulmonary) problems (especially infection) following:  A long period of time when you are unable to move or be active. BEFORE THE PROCEDURE   If the spirometer includes an indicator to show your best effort, your nurse or respiratory therapist will set it to a desired goal.  If possible, sit up straight or lean slightly  forward. Try  not to slouch.  Hold the incentive spirometer in an upright position. INSTRUCTIONS FOR USE  1. Sit on the edge of your bed if possible, or sit up as far as you can in bed or on a chair. 2. Hold the incentive spirometer in an upright position. 3. Breathe out normally. 4. Place the mouthpiece in your mouth and seal your lips tightly around it. 5. Breathe in slowly and as deeply as possible, raising the piston or the ball toward the top of the column. 6. Hold your breath for 3-5 seconds or for as long as possible. Allow the piston or ball to fall to the bottom of the column. 7. Remove the mouthpiece from your mouth and breathe out normally. 8. Rest for a few seconds and repeat Steps 1 through 7 at least 10 times every 1-2 hours when you are awake. Take your time and take a few normal breaths between deep breaths. 9. The spirometer may include an indicator to show your best effort. Use the indicator as a goal to work toward during each repetition. 10. After each set of 10 deep breaths, practice coughing to be sure your lungs are clear. If you have an incision (the cut made at the time of surgery), support your incision when coughing by placing a pillow or rolled up towels firmly against it. Once you are able to get out of bed, walk around indoors and cough well. You may stop using the incentive spirometer when instructed by your caregiver.  RISKS AND COMPLICATIONS  Take your time so you do not get dizzy or light-headed.  If you are in pain, you may need to take or ask for pain medication before doing incentive spirometry. It is harder to take a deep breath if you are having pain. AFTER USE  Rest and breathe slowly and easily.  It can be helpful to keep track of a log of your progress. Your caregiver can provide you with a simple table to help with this. If you are using the spirometer at home, follow these instructions: [place] IF:   You are having difficultly using the  spirometer.  You have trouble using the spirometer as often as instructed.  Your pain medication is not giving enough relief while using the spirometer.  You develop fever of 100.5 F (38.1 C) or higher. SEEK IMMEDIATE MEDICAL CARE IF:   You cough up bloody sputum that had not been present before.  You develop fever of 102 F (38.9 C) or greater.  You develop worsening pain at or near the incision site. MAKE SURE YOU:   Understand these instructions.  Will watch your condition.  Will get help right away if you are not doing well or get worse. Document Released: 11/05/2006 Document Revised: 09/17/2011 Document Reviewed: 01/06/2007 Exit[employer] Patient Information 2014 ExitCare, [place].   ________________________________________________________________________  WHAT IS A BLOOD TRANSFUSION? Blood Transfusion Information  A transfusion is the replacement of blood or some of its parts. Blood is made up of multiple cells which provide different functions.  Red blood cells carry oxygen and are used for blood loss replacement.  White blood cells fight against infection.  Platelets control bleeding.  Plasma helps clot blood.  Other blood products are available for specialized needs, such as hemophilia or other clotting disorders. BEFORE THE TRANSFUSION  Who gives blood for transfusions?   Healthy volunteers who are fully evaluated to make sure their blood is safe. This is blood bank blood. Transfusion therapy is the safest  it has ever been in the practice of medicine. Before blood is taken from a donor, a complete history is taken to make sure that person has no history of diseases nor engages in risky social behavior (examples are intravenous drug use or sexual activity with multiple partners). The donor's travel history is screened to minimize risk of transmitting infections, such as malaria. The donated blood is tested for signs of infectious diseases, such as HIV and hepatitis.  The blood is then tested to be sure it is compatible with you in order to minimize the chance of a transfusion reaction. If you or a relative donates blood, this is often done in anticipation of surgery and is not appropriate for emergency situations. It takes many days to process the donated blood. RISKS AND COMPLICATIONS Although transfusion therapy is very safe and saves many lives, the main dangers of transfusion include:   Getting an infectious disease.  Developing a transfusion reaction. This is an allergic reaction to something in the blood you were given. Every precaution is taken to prevent this. The decision to have a blood transfusion has been considered carefully by your caregiver before blood is given. Blood is not given unless the benefits outweigh the risks. AFTER THE TRANSFUSION  Right after receiving a blood transfusion, you will usually feel much better and more energetic. This is especially true if your red blood cells have gotten low (anemic). The transfusion raises the level of the red blood cells which carry oxygen, and this usually causes an energy increase.  The nurse administering the transfusion will monitor you carefully for complications. HOME CARE INSTRUCTIONS  No special instructions are needed after a transfusion. You may find your energy is better. Speak with your caregiver about any limitations on activity for underlying diseases you may have. SEEK MEDICAL CARE IF:   Your condition is not improving after your transfusion.  You develop redness or irritation at the intravenous (IV) site. SEEK IMMEDIATE MEDICAL CARE IF:  Any of the following symptoms occur over the next 12 hours:  Shaking chills.  You have a temperature by mouth above 102 F (38.9 C), not controlled by medicine.  Chest, back, or muscle pain.  People around you feel you are not acting correctly or are confused.  Shortness of breath or difficulty breathing.  Dizziness and fainting.  You  get a rash or develop hives.  You have a decrease in urine output.  Your urine turns a dark color or changes to pink, red, or brown. Any of the following symptoms occur over the next 10 days:  You have a temperature by mouth above 102 F (38.9 C), not controlled by medicine.  Shortness of breath.  Weakness after normal activity.  The white part of the eye turns yellow (jaundice).  You have a decrease in the amount of urine or are urinating less often.  Your urine turns a dark color or changes to pink, red, or brown. Document Released: 06/22/2000 Document Revised: 09/17/2011 Document Reviewed: 02/09/2008 [facility] Patient Information 2014 [place], [place].  _______________________________________________________________________

## 2019-04-06 NOTE — Progress Notes (Signed)
PCP - Vonna Kotyk [place]   03-30-19 HGA1C   CBC w/Diff   CMP  Cardiologist -  Chest x-ray -  EKG - 12-19-18  Stress Test -  ECHO -  Cardiac Cath -   Sleep Study -  CPAP -   Fasting Blood Sugar - 129 Checks Blood Sugar __0___ times a day  Blood Thinner Instructions: Aspirin Instructions: Last Dose:  Anesthesia review:   Patient denies shortness of breath, fever, cough and chest pain at PAT appointment   Patient verbalized understanding of instructions that were given to them at the PAT appointment. Patient was also instructed that they will need to review over the PAT instructions again at home before surgery.

## 2019-04-07 ENCOUNTER — Telehealth: Payer: Self-pay | Admitting: Family Medicine

## 2019-04-07 ENCOUNTER — Other Ambulatory Visit: Payer: Self-pay

## 2019-04-07 ENCOUNTER — Encounter (HOSPITAL_COMMUNITY)
Admission: RE | Admit: 2019-04-07 | Discharge: 2019-04-07 | Disposition: A | Discharge status: Home / Self Care | Payer: Medicare HMO | Source: Ambulatory Visit | Attending: Orthopaedic Surgery | Admitting: Orthopaedic Surgery

## 2019-04-07 ENCOUNTER — Other Ambulatory Visit (HOSPITAL_COMMUNITY)
Admission: RE | Admit: 2019-04-07 | Discharge: 2019-04-07 | Disposition: A | Discharge status: Home / Self Care | Payer: Medicare HMO | Source: Ambulatory Visit | Attending: Orthopaedic Surgery | Admitting: Orthopaedic Surgery

## 2019-04-07 ENCOUNTER — Encounter (HOSPITAL_COMMUNITY): Payer: Self-pay

## 2019-04-07 DIAGNOSIS — M792 Neuralgia and neuritis, unspecified: Secondary | ICD-10-CM | POA: Diagnosis not present

## 2019-04-07 DIAGNOSIS — M659 Synovitis and tenosynovitis, unspecified: Secondary | ICD-10-CM | POA: Diagnosis present

## 2019-04-07 DIAGNOSIS — Z471 Aftercare following joint replacement surgery: Secondary | ICD-10-CM | POA: Diagnosis not present

## 2019-04-07 DIAGNOSIS — E1169 Type 2 diabetes mellitus with other specified complication: Secondary | ICD-10-CM | POA: Diagnosis present

## 2019-04-07 DIAGNOSIS — Z720 Tobacco use: Secondary | ICD-10-CM | POA: Diagnosis not present

## 2019-04-07 DIAGNOSIS — Z885 Allergy status to narcotic agent status: Secondary | ICD-10-CM | POA: Diagnosis not present

## 2019-04-07 DIAGNOSIS — T84032D Mechanical loosening of internal right knee prosthetic joint, subsequent encounter: Secondary | ICD-10-CM | POA: Diagnosis not present

## 2019-04-07 DIAGNOSIS — Z888 Allergy status to other drugs, medicaments and biological substances status: Secondary | ICD-10-CM | POA: Diagnosis not present

## 2019-04-07 DIAGNOSIS — R6889 Other general symptoms and signs: Secondary | ICD-10-CM | POA: Diagnosis not present

## 2019-04-07 DIAGNOSIS — Z20828 Contact with and (suspected) exposure to other viral communicable diseases: Secondary | ICD-10-CM | POA: Diagnosis present

## 2019-04-07 DIAGNOSIS — E119 Type 2 diabetes mellitus without complications: Secondary | ICD-10-CM | POA: Diagnosis not present

## 2019-04-07 DIAGNOSIS — Z87442 Personal history of urinary calculi: Secondary | ICD-10-CM | POA: Diagnosis not present

## 2019-04-07 DIAGNOSIS — M5030 Other cervical disc degeneration, unspecified cervical region: Secondary | ICD-10-CM | POA: Diagnosis not present

## 2019-04-07 DIAGNOSIS — Z833 Family history of diabetes mellitus: Secondary | ICD-10-CM | POA: Diagnosis not present

## 2019-04-07 DIAGNOSIS — T84032A Mechanical loosening of internal right knee prosthetic joint, initial encounter: Secondary | ICD-10-CM | POA: Diagnosis present

## 2019-04-07 DIAGNOSIS — I152 Hypertension secondary to endocrine disorders: Secondary | ICD-10-CM | POA: Diagnosis present

## 2019-04-07 DIAGNOSIS — I10 Essential (primary) hypertension: Secondary | ICD-10-CM | POA: Diagnosis not present

## 2019-04-07 DIAGNOSIS — G8918 Other acute postprocedural pain: Secondary | ICD-10-CM | POA: Diagnosis not present

## 2019-04-07 DIAGNOSIS — G4730 Sleep apnea, unspecified: Secondary | ICD-10-CM | POA: Diagnosis present

## 2019-04-07 DIAGNOSIS — Z96651 Presence of right artificial knee joint: Secondary | ICD-10-CM | POA: Diagnosis not present

## 2019-04-07 DIAGNOSIS — Z01812 Encounter for preprocedural laboratory examination: Secondary | ICD-10-CM | POA: Insufficient documentation

## 2019-04-07 DIAGNOSIS — Z8249 Family history of ischemic heart disease and other diseases of the circulatory system: Secondary | ICD-10-CM | POA: Diagnosis not present

## 2019-04-07 DIAGNOSIS — Y792 Prosthetic and other implants, materials and accessory orthopedic devices associated with adverse incidents: Secondary | ICD-10-CM | POA: Diagnosis present

## 2019-04-07 DIAGNOSIS — Z9181 History of falling: Secondary | ICD-10-CM | POA: Diagnosis not present

## 2019-04-07 LAB — SURGICAL PCR SCREEN
MRSA, PCR: NEGATIVE
Staphylococcus aureus: NEGATIVE

## 2019-04-07 LAB — GLUCOSE, CAPILLARY: Glucose-Capillary: 129 mg/dL — ABNORMAL HIGH (ref 70–99)

## 2019-04-07 LAB — ABO/RH: ABO/RH(D): O POS

## 2019-04-07 NOTE — Progress Notes (Signed)
Late entry: Pt c/o redness in L eye, and around L ear after injection for neck discomfort by Dr. Nelva Bush prior to PAT appt.. Redness appeared to diminish during PAT, compared to redness when appointment initially begin. Pt assessed by Konrad Felix, PAC, and advised to contact Dr. Nelva Bush' office with any concerns or questions regarding type of injection given, and to report eye irritation and redness. Pt verbalized understanding.

## 2019-04-07 NOTE — Telephone Encounter (Signed)
FYI, saw Dr. Nelva Bush from [facility] this morning and was given an injection for pain in his neck.  Patient is unsure of what was injected.  A few hours after the injection, he began experiencing irritation of his eye, felt like eye was "asleep", some mild jaw pain, skin felt warm to touch.  Patient has not had any weakness, slurred speech, etc.  He has contacted Dr. Nelva Bush' office to find out if this could be side effects from the injection and is waiting on them to return his call.  I explained to patient that we would be unable to see him today because we could not see anyone with eye irritation due to Covid.  I advised him to go to the ER to be evaluated.  Patient wants to wait on Dr. Nelva Bush' office to return his call.

## 2019-04-08 LAB — NOVEL CORONAVIRUS, NAA (HOSP ORDER, SEND-OUT TO REF LAB; TAT 18-24 HRS): SARS-CoV-2, NAA: NOT DETECTED

## 2019-04-08 NOTE — Telephone Encounter (Signed)
Yes I agree, it could be related to possibly where they ended the injection and I would discuss it with Dr. Herma Mering, if he cannot get a hold of them then he should go to the emergency department

## 2019-04-08 NOTE — Telephone Encounter (Signed)
Patient aware.

## 2019-04-10 ENCOUNTER — Inpatient Hospital Stay (HOSPITAL_COMMUNITY): Payer: Medicare HMO | Admitting: Physician Assistant

## 2019-04-10 ENCOUNTER — Other Ambulatory Visit: Payer: Self-pay

## 2019-04-10 ENCOUNTER — Inpatient Hospital Stay (HOSPITAL_COMMUNITY)
Admission: RE | Admit: 2019-04-10 | Discharge: 2019-04-13 | DRG: 468 | Disposition: A | Discharge status: Home Health | Payer: Medicare HMO | Attending: Orthopaedic Surgery | Admitting: Orthopaedic Surgery

## 2019-04-10 ENCOUNTER — Encounter (HOSPITAL_COMMUNITY): Payer: Self-pay

## 2019-04-10 ENCOUNTER — Inpatient Hospital Stay (HOSPITAL_COMMUNITY): Payer: Medicare HMO | Admitting: Certified Registered Nurse Anesthetist

## 2019-04-10 ENCOUNTER — Inpatient Hospital Stay (HOSPITAL_COMMUNITY): Payer: Medicare HMO

## 2019-04-10 ENCOUNTER — Encounter (HOSPITAL_COMMUNITY)
Admission: RE | Disposition: A | Discharge status: Home / Self Care | Payer: Self-pay | Source: Home / Self Care | Attending: Orthopaedic Surgery

## 2019-04-10 DIAGNOSIS — Z87442 Personal history of urinary calculi: Secondary | ICD-10-CM | POA: Diagnosis not present

## 2019-04-10 DIAGNOSIS — Z885 Allergy status to narcotic agent status: Secondary | ICD-10-CM | POA: Diagnosis not present

## 2019-04-10 DIAGNOSIS — M659 Synovitis and tenosynovitis, unspecified: Secondary | ICD-10-CM | POA: Diagnosis present

## 2019-04-10 DIAGNOSIS — T84032A Mechanical loosening of internal right knee prosthetic joint, initial encounter: Secondary | ICD-10-CM | POA: Diagnosis present

## 2019-04-10 DIAGNOSIS — Z888 Allergy status to other drugs, medicaments and biological substances status: Secondary | ICD-10-CM

## 2019-04-10 DIAGNOSIS — E1169 Type 2 diabetes mellitus with other specified complication: Secondary | ICD-10-CM | POA: Diagnosis present

## 2019-04-10 DIAGNOSIS — I152 Hypertension secondary to endocrine disorders: Secondary | ICD-10-CM | POA: Diagnosis present

## 2019-04-10 DIAGNOSIS — T84032D Mechanical loosening of internal right knee prosthetic joint, subsequent encounter: Secondary | ICD-10-CM

## 2019-04-10 DIAGNOSIS — Y792 Prosthetic and other implants, materials and accessory orthopedic devices associated with adverse incidents: Secondary | ICD-10-CM | POA: Diagnosis present

## 2019-04-10 DIAGNOSIS — Z9181 History of falling: Secondary | ICD-10-CM

## 2019-04-10 DIAGNOSIS — G4730 Sleep apnea, unspecified: Secondary | ICD-10-CM | POA: Diagnosis present

## 2019-04-10 DIAGNOSIS — Z20828 Contact with and (suspected) exposure to other viral communicable diseases: Secondary | ICD-10-CM | POA: Diagnosis present

## 2019-04-10 DIAGNOSIS — Z96651 Presence of right artificial knee joint: Secondary | ICD-10-CM

## 2019-04-10 DIAGNOSIS — Z8249 Family history of ischemic heart disease and other diseases of the circulatory system: Secondary | ICD-10-CM | POA: Diagnosis not present

## 2019-04-10 DIAGNOSIS — Z720 Tobacco use: Secondary | ICD-10-CM

## 2019-04-10 DIAGNOSIS — Z833 Family history of diabetes mellitus: Secondary | ICD-10-CM

## 2019-04-10 DIAGNOSIS — Z471 Aftercare following joint replacement surgery: Secondary | ICD-10-CM | POA: Diagnosis not present

## 2019-04-10 HISTORY — PX: TOTAL KNEE REVISION: SHX996

## 2019-04-10 LAB — TYPE AND SCREEN
ABO/RH(D): O POS
Antibody Screen: NEGATIVE

## 2019-04-10 LAB — GLUCOSE, CAPILLARY
Glucose-Capillary: 113 mg/dL — ABNORMAL HIGH (ref 70–99)
Glucose-Capillary: 111 mg/dL — ABNORMAL HIGH (ref 70–99)

## 2019-04-10 SURGERY — TOTAL KNEE REVISION
Anesthesia: Monitor Anesthesia Care | Site: Knee | Laterality: Right

## 2019-04-10 MED ORDER — DIPHENHYDRAMINE HCL 12.5 MG/5ML PO ELIX
12.5000 mg | ORAL_SOLUTION | ORAL | Status: DC | PRN
  Administered 2019-04-11: 25 mg via ORAL
  Filled 2019-04-10: qty 10

## 2019-04-10 MED ORDER — CEFAZOLIN SODIUM-DEXTROSE 2-4 GM/100ML-% IV SOLN
INTRAVENOUS | Status: AC
  Filled 2019-04-10: qty 100

## 2019-04-10 MED ORDER — LISINOPRIL 20 MG PO TABS
40.0000 mg | ORAL_TABLET | Freq: Every day | ORAL | Status: DC
  Administered 2019-04-10 – 2019-04-13 (×4): 40 mg via ORAL
  Filled 2019-04-10 (×6): qty 2

## 2019-04-10 MED ORDER — ACETAMINOPHEN 160 MG/5ML PO SOLN: 1000.0000 mg | Freq: Once | ORAL | Status: DC | PRN

## 2019-04-10 MED ORDER — BUPIVACAINE HCL (PF) 0.25 % IJ SOLN
INTRAMUSCULAR | Status: DC | PRN
  Administered 2019-04-10: 30 mL via INTRA_ARTICULAR

## 2019-04-10 MED ORDER — FENTANYL CITRATE (PF) 100 MCG/2ML IJ SOLN
25.0000 ug | INTRAMUSCULAR | Status: DC | PRN
  Administered 2019-04-10 (×2): 50 ug via INTRAVENOUS

## 2019-04-10 MED ORDER — BUPIVACAINE IN DEXTROSE 0.75-8.25 % IT SOLN
INTRATHECAL | Status: DC | PRN
  Administered 2019-04-10: 1.8 mL via INTRATHECAL

## 2019-04-10 MED ORDER — PROPOFOL 500 MG/50ML IV EMUL
INTRAVENOUS | Status: AC
  Filled 2019-04-10: qty 50

## 2019-04-10 MED ORDER — ZOLPIDEM TARTRATE 5 MG PO TABS
5.0000 mg | ORAL_TABLET | Freq: Every evening | ORAL | Status: DC | PRN
  Administered 2019-04-11: 5 mg via ORAL
  Filled 2019-04-10: qty 1

## 2019-04-10 MED ORDER — FENTANYL CITRATE (PF) 100 MCG/2ML IJ SOLN
50.0000 ug | INTRAMUSCULAR | Status: DC
  Administered 2019-04-10 (×4): 50 ug via INTRAVENOUS
  Filled 2019-04-10: qty 2

## 2019-04-10 MED ORDER — METHOCARBAMOL 500 MG IVPB - SIMPLE MED
500.0000 mg | Freq: Four times a day (QID) | INTRAVENOUS | Status: DC | PRN
  Administered 2019-04-10: 500 mg via INTRAVENOUS
  Filled 2019-04-10: qty 50

## 2019-04-10 MED ORDER — ASPIRIN EC 325 MG PO TBEC
325.0000 mg | DELAYED_RELEASE_TABLET | Freq: Two times a day (BID) | ORAL | Status: DC
  Administered 2019-04-10 – 2019-04-13 (×6): 325 mg via ORAL
  Filled 2019-04-10 (×6): qty 1

## 2019-04-10 MED ORDER — OXYMETAZOLINE HCL 0.05 % NA SOLN
1.0000 | Freq: Two times a day (BID) | NASAL | Status: DC
  Administered 2019-04-11 – 2019-04-12 (×3): 1 via NASAL
  Filled 2019-04-10: qty 15

## 2019-04-10 MED ORDER — HYDROMORPHONE HCL 1 MG/ML IJ SOLN
0.5000 mg | INTRAMUSCULAR | Status: DC | PRN
  Administered 2019-04-10: 0.5 mg via INTRAVENOUS
  Administered 2019-04-10 – 2019-04-13 (×16): 1 mg via INTRAVENOUS
  Filled 2019-04-10 (×18): qty 1

## 2019-04-10 MED ORDER — KETOROLAC TROMETHAMINE 15 MG/ML IJ SOLN
15.0000 mg | Freq: Four times a day (QID) | INTRAMUSCULAR | Status: AC
  Administered 2019-04-10 – 2019-04-11 (×3): 15 mg via INTRAVENOUS
  Filled 2019-04-10 (×3): qty 1

## 2019-04-10 MED ORDER — CEFAZOLIN SODIUM-DEXTROSE 2-3 GM-%(50ML) IV SOLR
INTRAVENOUS | Status: DC | PRN
  Administered 2019-04-10: 2 g via INTRAVENOUS

## 2019-04-10 MED ORDER — DEXMEDETOMIDINE HCL IN NACL 200 MCG/50ML IV SOLN
INTRAVENOUS | Status: AC
  Filled 2019-04-10: qty 100

## 2019-04-10 MED ORDER — SODIUM CHLORIDE 0.9 % IV SOLN
INTRAVENOUS | Status: DC
  Administered 2019-04-10: 14:00:00 75 mL/h via INTRAVENOUS
  Administered 2019-04-11: 04:00:00 via INTRAVENOUS

## 2019-04-10 MED ORDER — PROPOFOL 10 MG/ML IV BOLUS
INTRAVENOUS | Status: AC
  Filled 2019-04-10: qty 20

## 2019-04-10 MED ORDER — ACETAMINOPHEN 500 MG PO TABS: 1000.0000 mg | ORAL_TABLET | Freq: Once | ORAL | Status: DC | PRN

## 2019-04-10 MED ORDER — MENTHOL 3 MG MT LOZG: 1.0000 | LOZENGE | OROMUCOSAL | Status: DC | PRN

## 2019-04-10 MED ORDER — ROPIVACAINE HCL 7.5 MG/ML IJ SOLN
INTRAMUSCULAR | Status: DC | PRN
  Administered 2019-04-10: 20 mL via PERINEURAL

## 2019-04-10 MED ORDER — METHOCARBAMOL 500 MG IVPB - SIMPLE MED
INTRAVENOUS | Status: AC
  Filled 2019-04-10: qty 50

## 2019-04-10 MED ORDER — CHLORHEXIDINE GLUCONATE 4 % EX LIQD: 60.0000 mL | Freq: Once | CUTANEOUS | Status: DC

## 2019-04-10 MED ORDER — ONDANSETRON HCL 4 MG/2ML IJ SOLN: 4.0000 mg | Freq: Four times a day (QID) | INTRAMUSCULAR | Status: DC | PRN

## 2019-04-10 MED ORDER — OXYCODONE HCL ER 10 MG PO T12A
10.0000 mg | EXTENDED_RELEASE_TABLET | Freq: Two times a day (BID) | ORAL | Status: DC
  Administered 2019-04-10 – 2019-04-13 (×7): 10 mg via ORAL
  Filled 2019-04-10 (×10): qty 1

## 2019-04-10 MED ORDER — BUPIVACAINE HCL (PF) 0.25 % IJ SOLN
INTRAMUSCULAR | Status: AC
  Filled 2019-04-10: qty 30

## 2019-04-10 MED ORDER — ONDANSETRON HCL 4 MG PO TABS: 4.0000 mg | ORAL_TABLET | Freq: Four times a day (QID) | ORAL | Status: DC | PRN

## 2019-04-10 MED ORDER — CEFAZOLIN SODIUM-DEXTROSE 1-4 GM/50ML-% IV SOLN
1.0000 g | Freq: Four times a day (QID) | INTRAVENOUS | Status: AC
  Administered 2019-04-10 (×2): 1 g via INTRAVENOUS
  Filled 2019-04-10 (×2): qty 50

## 2019-04-10 MED ORDER — OXYCODONE HCL 5 MG PO TABS: 5.0000 mg | ORAL_TABLET | Freq: Once | ORAL | Status: DC | PRN

## 2019-04-10 MED ORDER — POLYETHYLENE GLYCOL 3350 17 G PO PACK: 17.0000 g | PACK | Freq: Every day | ORAL | Status: DC | PRN

## 2019-04-10 MED ORDER — PHENOL 1.4 % MT LIQD
1.0000 | OROMUCOSAL | Status: DC | PRN
  Filled 2019-04-10: qty 177

## 2019-04-10 MED ORDER — POVIDONE-IODINE 10 % EX SWAB: 2.0000 "application " | Freq: Once | CUTANEOUS | Status: DC

## 2019-04-10 MED ORDER — DEXAMETHASONE SODIUM PHOSPHATE 4 MG/ML IJ SOLN
INTRAMUSCULAR | Status: DC | PRN
  Administered 2019-04-10: 4 mg via INTRAVENOUS

## 2019-04-10 MED ORDER — PREGABALIN 75 MG PO CAPS
75.0000 mg | ORAL_CAPSULE | Freq: Two times a day (BID) | ORAL | Status: DC | PRN
  Administered 2019-04-11 – 2019-04-12 (×2): 75 mg via ORAL
  Filled 2019-04-10 (×2): qty 1

## 2019-04-10 MED ORDER — ALUM & MAG HYDROXIDE-SIMETH 200-200-20 MG/5ML PO SUSP: 30.0000 mL | ORAL | Status: DC | PRN

## 2019-04-10 MED ORDER — METHOCARBAMOL 500 MG PO TABS
500.0000 mg | ORAL_TABLET | Freq: Four times a day (QID) | ORAL | Status: DC | PRN
  Administered 2019-04-10 – 2019-04-13 (×9): 500 mg via ORAL
  Filled 2019-04-10 (×9): qty 1

## 2019-04-10 MED ORDER — PROPOFOL 10 MG/ML IV BOLUS
INTRAVENOUS | Status: DC | PRN
  Administered 2019-04-10: 40 mg via INTRAVENOUS
  Administered 2019-04-10 (×2): 30 mg via INTRAVENOUS

## 2019-04-10 MED ORDER — DEXMEDETOMIDINE HCL IN NACL 200 MCG/50ML IV SOLN
INTRAVENOUS | Status: DC | PRN
  Administered 2019-04-10: 1 ug/kg/h via INTRAVENOUS

## 2019-04-10 MED ORDER — ACETAMINOPHEN 325 MG PO TABS
325.0000 mg | ORAL_TABLET | Freq: Four times a day (QID) | ORAL | Status: DC | PRN
  Administered 2019-04-11 – 2019-04-13 (×6): 650 mg via ORAL
  Filled 2019-04-10 (×6): qty 2

## 2019-04-10 MED ORDER — DEXAMETHASONE SODIUM PHOSPHATE 10 MG/ML IJ SOLN
INTRAMUSCULAR | Status: AC
  Filled 2019-04-10: qty 1

## 2019-04-10 MED ORDER — OXYCODONE HCL ER 10 MG PO T12A: 10.0000 mg | EXTENDED_RELEASE_TABLET | ORAL | Status: DC

## 2019-04-10 MED ORDER — ACETAMINOPHEN 10 MG/ML IV SOLN: 1000.0000 mg | Freq: Once | INTRAVENOUS | Status: DC | PRN

## 2019-04-10 MED ORDER — NICOTINE 21 MG/24HR TD PT24
21.0000 mg | MEDICATED_PATCH | Freq: Every day | TRANSDERMAL | Status: DC
  Administered 2019-04-10 – 2019-04-12 (×4): 21 mg via TRANSDERMAL
  Filled 2019-04-10 (×5): qty 1

## 2019-04-10 MED ORDER — OXYCODONE HCL 5 MG/5ML PO SOLN: 5.0000 mg | Freq: Once | ORAL | Status: DC | PRN

## 2019-04-10 MED ORDER — SODIUM CHLORIDE 0.9 % IR SOLN
Status: DC | PRN
  Administered 2019-04-10: 2000 mL

## 2019-04-10 MED ORDER — OXYCODONE HCL 5 MG PO TABS
5.0000 mg | ORAL_TABLET | ORAL | Status: DC | PRN
  Administered 2019-04-10 (×2): 10 mg via ORAL
  Filled 2019-04-10: qty 1
  Filled 2019-04-10: qty 2

## 2019-04-10 MED ORDER — PANTOPRAZOLE SODIUM 40 MG PO TBEC
40.0000 mg | DELAYED_RELEASE_TABLET | Freq: Every day | ORAL | Status: DC
  Administered 2019-04-10 – 2019-04-13 (×4): 40 mg via ORAL
  Filled 2019-04-10 (×4): qty 1

## 2019-04-10 MED ORDER — ACETAMINOPHEN 500 MG PO TABS
1000.0000 mg | ORAL_TABLET | ORAL | Status: AC
  Administered 2019-04-10: 1000 mg via ORAL
  Filled 2019-04-10: qty 2

## 2019-04-10 MED ORDER — MIDAZOLAM HCL 2 MG/2ML IJ SOLN
1.0000 mg | INTRAMUSCULAR | Status: DC
  Administered 2019-04-10: 2 mg via INTRAVENOUS
  Filled 2019-04-10: qty 2

## 2019-04-10 MED ORDER — METFORMIN HCL 500 MG PO TABS
500.0000 mg | ORAL_TABLET | Freq: Two times a day (BID) | ORAL | Status: DC
  Administered 2019-04-10 – 2019-04-13 (×6): 500 mg via ORAL
  Filled 2019-04-10 (×6): qty 1

## 2019-04-10 MED ORDER — OXYCODONE HCL 5 MG PO TABS
10.0000 mg | ORAL_TABLET | ORAL | Status: DC | PRN
  Administered 2019-04-10: 15 mg via ORAL
  Administered 2019-04-10: 5 mg via ORAL
  Administered 2019-04-11: 15 mg via ORAL
  Filled 2019-04-10: qty 2
  Filled 2019-04-10 (×2): qty 3

## 2019-04-10 MED ORDER — LACTATED RINGERS IV SOLN
INTRAVENOUS | Status: DC
  Administered 2019-04-10: 09:00:00 via INTRAVENOUS
  Administered 2019-04-10: 13:00:00 1000 mL via INTRAVENOUS
  Administered 2019-04-10: 07:00:00 via INTRAVENOUS

## 2019-04-10 MED ORDER — ONDANSETRON HCL 4 MG/2ML IJ SOLN
INTRAMUSCULAR | Status: DC | PRN
  Administered 2019-04-10: 4 mg via INTRAVENOUS

## 2019-04-10 MED ORDER — DOCUSATE SODIUM 100 MG PO CAPS
100.0000 mg | ORAL_CAPSULE | Freq: Two times a day (BID) | ORAL | Status: DC
  Administered 2019-04-10 – 2019-04-13 (×6): 100 mg via ORAL
  Filled 2019-04-10 (×6): qty 1

## 2019-04-10 MED ORDER — FENTANYL CITRATE (PF) 100 MCG/2ML IJ SOLN
INTRAMUSCULAR | Status: AC
  Filled 2019-04-10: qty 2

## 2019-04-10 MED ORDER — ONDANSETRON HCL 4 MG/2ML IJ SOLN
INTRAMUSCULAR | Status: AC
  Filled 2019-04-10: qty 2

## 2019-04-10 MED ORDER — TRANEXAMIC ACID-NACL 1000-0.7 MG/100ML-% IV SOLN
1000.0000 mg | INTRAVENOUS | Status: AC
  Administered 2019-04-10: 1000 mg via INTRAVENOUS
  Filled 2019-04-10: qty 100

## 2019-04-10 MED ORDER — OXYCODONE HCL 5 MG PO TABS
10.0000 mg | ORAL_TABLET | ORAL | Status: AC
  Administered 2019-04-10: 10 mg via ORAL
  Filled 2019-04-10: qty 2

## 2019-04-10 MED ORDER — METOCLOPRAMIDE HCL 5 MG PO TABS: 5.0000 mg | ORAL_TABLET | Freq: Three times a day (TID) | ORAL | Status: DC | PRN

## 2019-04-10 MED ORDER — EPHEDRINE 5 MG/ML INJ
INTRAVENOUS | Status: AC
  Filled 2019-04-10: qty 10

## 2019-04-10 MED ORDER — METOCLOPRAMIDE HCL 5 MG/ML IJ SOLN: 5.0000 mg | Freq: Three times a day (TID) | INTRAMUSCULAR | Status: DC | PRN

## 2019-04-10 SURGICAL SUPPLY — 65 items
ADAPTER BOLT FEMORAL +2/-2 (Knees) ×1 IMPLANT
BAG ZIPLOCK 12X15 (MISCELLANEOUS) IMPLANT
BENZOIN TINCTURE PRP APPL 2/3 (GAUZE/BANDAGES/DRESSINGS) IMPLANT
BLADE SAG 18X100X1.27 (BLADE) ×2 IMPLANT
BLADE SAW SGTL 11.0X1.19X90.0M (BLADE) ×1 IMPLANT
BLADE SURG SZ10 CARB STEEL (BLADE) ×4 IMPLANT
BNDG ELASTIC 6X5.8 VLCR STR LF (GAUZE/BANDAGES/DRESSINGS) ×2 IMPLANT
CEMENT HV SMART SET (Cement) ×3 IMPLANT
CLOTH BEACON ORANGE TIMEOUT ST (SAFETY) ×2 IMPLANT
COMP FEM CEM RT SZ3 (Orthopedic Implant) ×2 IMPLANT
COMPONENT FEM CEM RT SZ3 (Orthopedic Implant) IMPLANT
COVER SURGICAL LIGHT HANDLE (MISCELLANEOUS) ×2 IMPLANT
COVER WAND RF STERILE (DRAPES) IMPLANT
CUFF TOURN SGL QUICK 34 (TOURNIQUET CUFF) ×1
CUFF TRNQT CYL 34X4.125X (TOURNIQUET CUFF) ×1 IMPLANT
DECANTER SPIKE VIAL GLASS SM (MISCELLANEOUS) ×1 IMPLANT
DISTAL WEDGE PFC 4MM RIGHT (Knees) ×4 IMPLANT
DRAPE U-SHAPE 47X51 STRL (DRAPES) ×2 IMPLANT
DRSG PAD ABDOMINAL 8X10 ST (GAUZE/BANDAGES/DRESSINGS) ×2 IMPLANT
DURAPREP 26ML APPLICATOR (WOUND CARE) ×2 IMPLANT
ELECT REM PT RETURN 15FT ADLT (MISCELLANEOUS) ×2 IMPLANT
EVACUATOR 1/8 PVC DRAIN (DRAIN) IMPLANT
FEMORAL ADAPTER (Orthopedic Implant) ×1 IMPLANT
GAUZE SPONGE 4X4 12PLY STRL (GAUZE/BANDAGES/DRESSINGS) ×2 IMPLANT
GAUZE XEROFORM 5X9 LF (GAUZE/BANDAGES/DRESSINGS) IMPLANT
GLOVE BIO SURGEON STRL SZ7.5 (GLOVE) ×8 IMPLANT
GLOVE BIOGEL PI IND STRL 8 (GLOVE) ×2 IMPLANT
GLOVE BIOGEL PI INDICATOR 8 (GLOVE) ×2
GLOVE ECLIPSE 8.0 STRL XLNG CF (GLOVE) ×2 IMPLANT
GOWN STRL REUS W/TWL XL LVL3 (GOWN DISPOSABLE) ×6 IMPLANT
HANDPIECE INTERPULSE COAX TIP (DISPOSABLE) ×1
HOLDER FOLEY CATH W/STRAP (MISCELLANEOUS) ×1 IMPLANT
IMMOBILIZER KNEE 20 (SOFTGOODS) ×2
IMMOBILIZER KNEE 20 THIGH 36 (SOFTGOODS) ×1 IMPLANT
INSERT TC3 RP TIBIAL SZ 3.0 (Knees) ×1 IMPLANT
KIT TURNOVER KIT A (KITS) ×1 IMPLANT
NDL SAFETY ECLIPSE 18X1.5 (NEEDLE) IMPLANT
NEEDLE HYPO 18GX1.5 SHARP (NEEDLE) ×1
PACK TOTAL KNEE CUSTOM (KITS) ×2 IMPLANT
PADDING CAST COTTON 6X4 STRL (CAST SUPPLIES) ×4 IMPLANT
PIN STEINMAN FIXATION KNEE (PIN) ×1 IMPLANT
PROTECTOR NERVE ULNAR (MISCELLANEOUS) ×2 IMPLANT
SET HNDPC FAN SPRY TIP SCT (DISPOSABLE) ×1 IMPLANT
SET PAD KNEE POSITIONER (MISCELLANEOUS) ×2 IMPLANT
SPONGE LAP 18X18 RF (DISPOSABLE) IMPLANT
STAPLER VISISTAT 35W (STAPLE) IMPLANT
STEM TIBIA PFC 13X60MM (Stem) ×1 IMPLANT
STEM UNIVERSAL REVISION 75X14 (Stem) ×1 IMPLANT
STRIP CLOSURE SKIN 1/2X4 (GAUZE/BANDAGES/DRESSINGS) IMPLANT
SUT ETHIBOND NAB CT1 #1 30IN (SUTURE) ×1 IMPLANT
SUT MNCRL AB 4-0 PS2 18 (SUTURE) IMPLANT
SUT VIC AB 0 CT1 36 (SUTURE) ×3 IMPLANT
SUT VIC AB 1 CT1 36 (SUTURE) ×4 IMPLANT
SUT VIC AB 2-0 CT1 27 (SUTURE) ×3
SUT VIC AB 2-0 CT1 TAPERPNT 27 (SUTURE) ×2 IMPLANT
SWAB COLLECTION DEVICE MRSA (MISCELLANEOUS) IMPLANT
SWAB CULTURE ESWAB REG 1ML (MISCELLANEOUS) IMPLANT
SYR 3ML LL SCALE MARK (SYRINGE) IMPLANT
TOWER CARTRIDGE SMART MIX (DISPOSABLE) IMPLANT
TRAY FOLEY MTR SLVR 16FR STAT (SET/KITS/TRAYS/PACK) ×2 IMPLANT
TRAY REVISION SZ 3 (Knees) ×1 IMPLANT
TRAY SLEEVE CEM ML (Knees) ×1 IMPLANT
TUBE KAMVAC SUCTION (TUBING) IMPLANT
WATER STERILE IRR 1000ML POUR (IV SOLUTION) ×3 IMPLANT
WEDGE DISTAL PFC RIGHT 4MM (Knees) IMPLANT

## 2019-04-10 NOTE — Progress Notes (Signed)
Knee block not done in pre-op.  Versed 2mg  and fentanyl 127mcg given to [facility]CRNA to be given in [place].

## 2019-04-10 NOTE — Transfer of Care (Signed)
Immediate Anesthesia Transfer of Care Note  Patient: Nixon Kolton  Procedure(s) Performed: RIGHT TOTAL KNEE REVISION (Right Knee)  Patient Location: PACU  Anesthesia Type:Spinal  Level of Consciousness: awake, alert  and oriented  Airway & Oxygen Therapy: Patient Spontanous Breathing and Patient connected to face mask oxygen  Post-op Assessment: Report given to RN and Post -op Vital signs reviewed and stable  Post vital signs: Reviewed and stable  Last Vitals:  Vitals Value Taken Time  BP 113/81 04/10/19 1200  Temp    Pulse 48 04/10/19 1206  Resp 14 04/10/19 1206  SpO2 100 % 04/10/19 1206  Vitals shown include unvalidated device data.  Last Pain:  Vitals:   04/10/19 1200  TempSrc:   PainSc: (P) Asleep      Patients Stated Pain Goal: 5 (89/38/10 1751)  Complications: No apparent anesthesia complications

## 2019-04-10 NOTE — Brief Op Note (Signed)
04/10/2019  11:33 AM  PATIENT:  Alex [name]  58 y.o. male  PRE-OPERATIVE DIAGNOSIS:  Right Total Knee Aseptic Loosening  POST-OPERATIVE DIAGNOSIS:  right total knee aspetic loosening  PROCEDURE:  Procedure(s): RIGHT TOTAL KNEE REVISION (Right)  SURGEON:  Surgeon(s) and Role:    Mcarthur Rossetti, MD - Primary  PHYSICIAN ASSISTANT: Benita Stabile, PA-C  ANESTHESIA:   local, regional and spinal  EBL:  250 mL   COUNTS:  YES  TOURNIQUET:   Total Tourniquet Time Documented: Thigh (Right) - 85 minutes Thigh (Right) - 20 minutes Total: Thigh (Right) - 105 minutes   DICTATION: .Other Dictation: Dictation Number [id number]  PLAN OF CARE: Admit to inpatient   PATIENT DISPOSITION:  PACU - hemodynamically stable.   Delay start of Pharmacological VTE agent (>24hrs) due to surgical blood loss or risk of bleeding: no

## 2019-04-10 NOTE — Op Note (Signed)
Alex [name], FREDIANI MEDICAL RECORD [id number] ACCOUNT [id number] DATE OF BIRTH:Oct 01, 1960 FACILITY: [facility] LOCATION: [id number] PHYSICIAN:Anneka Studer Aretha Parrot, MD  OPERATIVE REPORT  DATE OF PROCEDURE:  04/10/2019  PREOPERATIVE DIAGNOSIS:  Loose/failed right total knee arthroplasty.  POSTOPERATIVE DIAGNOSIS:  Loose/failed right total knee arthroplasty.  PROCEDURE:  Right total knee revision arthroplasty of femoral and tibial components.  IMPLANTS:   1.  DePuy size 3 right TC3 femur with 4 mm medial and lateral distal femoral augments and a 75 x 14 press-fit stem. 2.  Size 3 MBT revision tray with a size 29 cemented sleeve and 30 x 60 cemented stem. 3.  Size 3, 17.5 TC3 RP polyethylene insert.  SURGEON:  Vanita Panda. Magnus Ivan, MD  ASSISTANT:  Richardean Canal, PA-C.  ANESTHESIA: 1.  Spinal. 2.  Local with 0.25% plain Marcaine intra-articular injection. 3.  Right lower extremity adductor canal block.  ANTIBIOTICS:  Two grams IV Ancef.  ESTIMATED BLOOD LOSS:  200-250 mL.  COMPLICATIONS:  None (implant expiration date not found on the container so there was a 15-20 minute delay in the surgery while a new femoral component was located at the other [facility] facility.  INDICATIONS:  The patient is a 58 year old gentleman who a few years ago had a right total knee arthroplasty done in [place].  He has since moved to this area.  He has been developing right knee recurrent effusions and significant pain.  He sought  further evaluation and treatment with my clinic.  X-rays in the clinic showed loosening of the femoral component with obvious synovitis and cystic changes around the tibial component.  I was concerned about this being a possibility of the femoral  component.  He showed no evidence of infection clinically or with any laboratory values and aspiration of the knee.  We have recommended revision arthroplasty of likely all components based on his clinical exam findings and x-ray  findings.  We had a long  and thorough discussion about this.  He is having significant pain in his knee and given the recurrent effusions, we do feel that revision arthroplasty is warranted.  DESCRIPTION OF PROCEDURE:  After informed consent was obtained and appropriate right knee was marked, he was brought to the operating room and sat up on the operating table where spinal anesthesia was obtained.  He was then laid in supine position on the  operating table.  Foley catheter was placed.  A nonsterile tourniquet was placed around his upper right thigh.  His right thigh, knee, leg and ankle were prepped and draped with DuraPrep and sterile drapes including a sterile stockinette.  Time-out was  called and he was identified duration correct right knee.  We then used an Esmarch to wrap that leg and tourniquet was inflated to 300 mm of pressure.  We then made a direct midline incision over the patella carrying this through his incision proximally  and distally.  We dissected down the knee joint carried out a medial parapatellar arthrotomy and found significant synovitis in his knee consistent with polyethylene liner wear.  There was only clear effusion and no evidence of infection at all.  Once we  removed abundant scar tissue and performed a synovectomy from around the knee, we flexed the knee.  We assessed the patellar component and found it to be intact.  The tibia tray was obviously loose and we were able to remove the tibial tray without  removing much bone.  We had to remove cystic material from the anterior medial tibia.  We  then removed all cement debris.  Of note, there was no interdigitation between the cement and the implant itself.  Once we removed the tibial component, we looked  at the femoral side.  I was concerned about synovitis around the femoral component and did find it to be loose and I removed without difficulty without even removing much bone at all.  There is actually better bone  interdigitation with cement  interdigitation of the femoral component.  Once we had those components removed, we did our freshening cut on the tibia, which was easily done using outrigger guides to make this cut an even cut.  We then prepared our tibia with hand reaming and placing  a temporary sleeve with a size 29 sleeve.  We chose a size 3 tibial tray for coverage.  We went to the femur and did hand reaming of the femur up to a size 14 hand reamer.  Based off of this, we then did a freshening distal femoral cut, taking just a few  millimeters of bone and then our femoral box cut setting this for a right knee with the appropriate rotation as well.  We did bring the knee down to 90 degrees of flexion and we felt our flexion gap looked good in terms of even.  We then were able to  place a trial femur, which was a size 3 femur, with our stem and 4 mm distal femoral augments on the medial and lateral sides.  We placed our trial tibial tray and then we trialed up to a 17.5 TC3 trial insert and we were pleased with stability and range  of motion of the knee with that.  We then removed all trial instrumentation from the knee and irrigated the knee with normal saline solution using pulsatile lavage.  I then placed the 0.25% plain Marcaine intra-articular injection around the joint  capsule.  The rep then confirmed the implant sizes and the circulating nurse did open up the implants.  However, the femoral implant did not have a sticker showing an expiration date.  With that being said, we did not feel comfortable per hospital  protocol and OR of protocol putting that femoral component in.  A femoral component had to [facility] from [facility].  With that being said, we had to wait about 20 minutes to delay the case.  I did let the tourniquet down and we kept the tourniquet  down for the entire 20 minutes.  Hemostasis was obtained with electrocautery.  Once we did confirm a new implant had arrived and then we saw  implant with the expiration date, we had all implants opened so they could be assembled on the back table.  We  then reinflated the tourniquet and dried the knee real well.  We irrigated again with an additional liter of normal saline solution.  We then mixed our cement after we put our tibial and femoral components together.  We then cemented the real size 3 MTP  revision tray with a 29 cemented sleeve and a 13 x 60 cemented stem.  We also cemented our TC3 right size 3 femur with our 5 degree adapter with 4 mm medial and lateral distal femoral augments and a 17 x 14 stem.  We let the cement harden enough and then  we placed our real 17.5 TC3 rotating platform polyethylene insert for a size 3 tibia.  We held the knee in full extension and we were able to remove cement debris while the  cement cured.  Once it had hardened, we felt good with range of motion and  stability of the knee.  We let the tourniquet down again and hemostasis obtained with electrocautery.  We then closed the arthrotomy with interrupted #1 Vicryl suture followed by closing the deep tissue was 0 Vicryl, 2-0 Vicryl was used to close  subcutaneous tissue and interrupted staples to close the skin incision.  Xeroform well-padded sterile dressing was applied.  He was taken to recovery room in stable condition.  All final counts were correct.  There were no complications noted other than  the aforementioned expiration date issue with the femoral component, which is not a true complication in terms of a surgical complication.  Of note, Benita Stabile, PA-C, assisted in the entire case.  His assistance was crucial for facilitating all aspects of  this case.  The patient will be admitted postoperatively for physical therapy, pain control and antibiotics.  TN/NUANCE  D:04/10/2019 T:04/10/2019 JOB:[id number]

## 2019-04-10 NOTE — Anesthesia Procedure Notes (Signed)
Anesthesia Regional Block: Adductor canal block   Pre-Anesthetic Checklist: ,, timeout performed, Correct Patient, Correct Site, Correct Laterality, Correct Procedure, Correct Position, site marked, Risks and benefits discussed,  Surgical consent,  Pre-op evaluation,  At surgeon's request and post-op pain management  Laterality: Right and Lower  Prep: chloraprep       Needles:  Injection technique: Single-shot     Needle Length: 9cm  Needle Gauge: 22     Additional Needles: Arrow StimuQuik ECHO Echogenic Stimulating PNB Needle  Procedures:,,,, ultrasound used (permanent image in chart),,,,  Narrative:  Start time: 04/10/2019 11:41 AM End time: 04/10/2019 11:45 AM Injection made incrementally with aspirations every 5 mL.  Performed by: Personally  Anesthesiologist: Oleta Mouse, MD

## 2019-04-10 NOTE — H&P (Signed)
TOTAL KNEE REVISION ADMISSION H&P  Patient is being admitted for right revision total knee arthroplasty.  Subjective:  Chief Complaint:right knee pain.  HPI: Alex [name], 58 y.o. male, has a history of pain and functional disability in the right knee(s) due to failed previous arthroplasty and patient has failed non-surgical conservative treatments for greater than 12 weeks to include NSAID's and/or analgesics, flexibility and strengthening excercises and activity modification. The indications for the revision of the total knee arthroplasty are loosening of one or more components, fracture or mechanical failure of one or components, progressive or substantial perporsthetic bone loss and bearing surface wear leading to symptomatic synovitis and implant or knee misalignment. Onset of symptoms was gradual starting 2 years ago with gradually worsening course since that time.  Prior procedures on the right knee(s) include arthroplasty.  Patient currently rates pain in the right knee(s) at 10 out of 10 with activity. There is night pain, worsening of pain with activity and weight bearing, pain that interferes with activities of daily living and pain with passive range of motion.  Patient has evidence of subchondral cysts and prosthetic loosening by imaging studies. This condition presents safety issues increasing the risk of falls.  There is no current active infection.  Patient Active Problem List   Diagnosis Date Noted  . Loose right total knee arthroplasty (HCC) 03/09/2019  . Pain in right knee 02/17/2019  . Cervical spine degeneration 12/30/2018  . Right wrist pain 12/19/2018  . Right arm numbness 12/19/2018  . S/P cervical spinal fusion 03/27/2018  . Morbid obesity (HCC) 02/21/2018  . Diabetes mellitus type 2 in obese (HCC) 03/28/2017  . Hypertension associated with diabetes (HCC) 08/25/2016   Past Medical History:  Diagnosis Date  . Diabetes mellitus without complication (HCC)   . History  of kidney stones    removed 5 months ago  . Hypertension   . PONV (postoperative nausea and vomiting)    AGITATION after anesthesia as well  . Sleep apnea    NO CPAP    Past Surgical History:  Procedure Laterality Date  . ANTERIOR CERVICAL DECOMP/DISCECTOMY FUSION N/A 03/27/2018   Procedure: ANTERIOR CERVICAL DECOMPRESSION/DISCECTOMY FUSION C3-5;  Surgeon: Venita Lick, MD;  Location: [facility] OR;  Service: Orthopedics;  Laterality: N/A;  3.5 hrs  . arm surgery Right    x2 involving tendons/ulnar nerve  . BACK SURGERY    . CARPAL TUNNEL RELEASE Right   . clavical surgery    . EYE SURGERY Bilateral    glaucoma  . HERNIA REPAIR    . JOINT REPLACEMENT Right 06/2011  . TRICEPS TENDON REPAIR      Current Facility-Administered Medications  Medication Dose Route Frequency Provider Last Rate Last Dose  . chlorhexidine (HIBICLENS) 4 % liquid 4 application  60 mL Topical Once Richardean Canal W, PA-C      . fentaNYL (SUBLIMAZE) injection 50-100 mcg  50-100 mcg Intravenous Maryclare Labrador, MD      . lactated ringers infusion   Intravenous Continuous Val Eagle, MD 10 mL/hr at 04/10/19 [phone]    . midazolam (VERSED) injection 1-2 mg  1-2 mg Intravenous Maryclare Labrador, MD      . povidone-iodine 10 % swab 2 application  2 application Topical Once Richardean Canal W, PA-C      . tranexamic acid (CYKLOKAPRON) IVPB 1,000 mg  1,000 mg Intravenous To OR Kirtland Bouchard, PA-C       Allergies  Allergen Reactions  . Morphine And Related Nausea Only  .  Buprenorphine Hcl-Naloxone Hcl Nausea Only  . Codeine Nausea Only and Other (See Comments)    per pt, "felt weird in my head"    Social History   Tobacco Use  . Smoking status: Never Smoker  . Smokeless tobacco: Current User    Types: Chew  Substance Use Topics  . Alcohol use: No    Family History  Problem Relation Age of Onset  . Cancer Mother   . Diabetes Father   . Hypertension Father   . Heart attack Father       Review  of Systems  Musculoskeletal: Positive for back pain and joint pain.  All other systems reviewed and are negative.    Objective:  Physical Exam  Constitutional: He is oriented to person, place, and time. He appears well-developed and well-nourished.  HENT:  Head: Normocephalic and atraumatic.  Eyes: Pupils are equal, round, and reactive to light. EOM are normal.  Neck: Normal range of motion. Neck supple.  Cardiovascular: Normal rate.  Respiratory: Effort normal.  GI: Soft.  Musculoskeletal:     Right knee: He exhibits effusion and bony tenderness. Tenderness found. Medial joint line and lateral joint line tenderness noted.  Neurological: He is alert and oriented to person, place, and time.  Skin: Skin is warm and dry.  Psychiatric: He has a normal mood and affect.    Vital signs in last 24 hours: Temp:  [98.6 F (37 C)] 98.6 F (37 C) (10/02 0630) Pulse Rate:  [66] 66 (10/02 0630) Resp:  [18] 18 (10/02 0630) BP: (149)/(89) 149/89 (10/02 0630) SpO2:  [99 %] 99 % (10/02 0630) Weight:  [99.8 kg] 99.8 kg (10/02 0613)  Labs:  Estimated body mass index is 35.51 kg/m as calculated from the following:   Height as of this encounter: 5\' 6"  (1.676 m).   Weight as of this encounter: 99.8 kg.  Imaging Review Plain radiographs demonstrate loosening of the tibial component of the right knee prosthesis as well as lytic/cystic changes to the bone    Assessment/Plan:  Right knee pain and swelling, right knee(s) with failed previous arthroplasty.   The patient history, physical examination, clinical judgment of the provider and imaging studies are consistent with failure of the right knee(s), previous total knee arthroplasty. Revision total knee arthroplasty is deemed medically necessary. The treatment options including medical management, injection therapy, arthroscopy and revision arthroplasty were discussed at length. The risks and benefits of revision total knee arthroplasty were  presented and reviewed. The risks due to aseptic loosening, infection, stiffness, patella tracking problems, thromboembolic complications and other imponderables were discussed. The patient acknowledged the explanation, agreed to proceed with the plan and consent was signed. Patient is being admitted for inpatient treatment for surgery, pain control, PT, OT, prophylactic antibiotics, VTE prophylaxis, progressive ambulation and ADL's and discharge planning.The patient is planning to be discharged home with home health services

## 2019-04-10 NOTE — Anesthesia Procedure Notes (Signed)
Spinal  Patient location during procedure: OR Start time: 04/10/2019 8:34 AM Staffing Resident/CRNA: [place] Shermaine Rivet C, CRNA Performed: resident/CRNA  Preanesthetic Checklist Completed: patient identified, site marked, surgical consent, pre-op evaluation, timeout performed, IV checked, risks and benefits discussed and monitors and equipment checked Spinal Block Patient position: sitting Prep: site prepped and draped and DuraPrep Patient monitoring: heart rate, cardiac monitor, continuous pulse ox and blood pressure Approach: midline Location: L3-4 Injection technique: single-shot Needle Needle type: Pencan  Needle gauge: 24 G Needle length: 9 cm Assessment Sensory level: T4 Additional Notes IV functioning, monitors applied to pt. Expiration date of kit checked and confirmed to be in date. Sterile prep and drape, hand hygiene and sterile gloved used. Pt was positioned and spine was prepped in sterile fashion. Skin was anesthetized with lidocaine. Free flow of clear CSF obtained prior to injecting local anesthetic into CSF x 1 attempt. Spinal needle aspirated freely following injection. Needle was carefully withdrawn, and pt tolerated procedure well. Loss of motor and sensory on exam post injection.

## 2019-04-10 NOTE — Anesthesia Postprocedure Evaluation (Signed)
Anesthesia Post Note  Patient: Alex [name]  Procedure(s) Performed: RIGHT TOTAL KNEE REVISION (Right Knee)     Patient location during evaluation: PACU Anesthesia Type: Regional, MAC and Spinal Level of consciousness: awake and alert Pain management: pain level controlled Vital Signs Assessment: post-procedure vital signs reviewed and stable Respiratory status: spontaneous breathing, nonlabored ventilation, respiratory function stable and patient connected to nasal cannula oxygen Cardiovascular status: stable and blood pressure returned to baseline Postop Assessment: no apparent nausea or vomiting Anesthetic complications: no    Last Vitals:  Vitals:   04/10/19 1543 04/10/19 1545  BP: (!) 138/99 (!) 138/99  Pulse: 62   Resp: 16   Temp:    SpO2: 100%     Last Pain:  Vitals:   04/10/19 1419  TempSrc:   PainSc: 7                  Lashai Grosch

## 2019-04-10 NOTE — Anesthesia Preprocedure Evaluation (Signed)
Anesthesia Evaluation  Patient identified by MRN, date of birth, ID band Patient awake    Reviewed: Allergy & Precautions, NPO status , Patient's Chart, lab work & pertinent test results  History of Anesthesia Complications (+) PONV and history of anesthetic complications  Airway Mallampati: IV  TM Distance: >3 FB Neck ROM: Limited    Dental  (+) Missing, Dental Advisory Given, Poor Dentition   Pulmonary neg shortness of breath, sleep apnea , neg recent URI,    breath sounds clear to auscultation       Cardiovascular hypertension, Pt. on medications (-) angina(-) Past MI  Rhythm:Regular     Neuro/Psych negative neurological ROS  negative psych ROS   GI/Hepatic negative GI ROS, Neg liver ROS,   Endo/Other  diabetes, Type 2  Renal/GU negative Renal ROS     Musculoskeletal  (+) Arthritis ,   Abdominal   Peds  Hematology negative hematology ROS (+)   Anesthesia Other Findings   Reproductive/Obstetrics                             Anesthesia Physical Anesthesia Plan  ASA: II  Anesthesia Plan: MAC, Spinal and Regional   Post-op Pain Management:    Induction:   PONV Risk Score and Plan: 2 and Treatment may vary due to age or medical condition and Dexamethasone  Airway Management Planned: Nasal Cannula  Additional Equipment: None  Intra-op Plan:   Post-operative Plan:   Informed Consent: I have reviewed the patients History and Physical, chart, labs and discussed the procedure including the risks, benefits and alternatives for the proposed anesthesia with the patient or authorized representative who has indicated his/her understanding and acceptance.     Dental advisory given  Plan Discussed with: CRNA and Surgeon  Anesthesia Plan Comments:         Anesthesia Quick Evaluation

## 2019-04-10 NOTE — Evaluation (Signed)
Physical Therapy Evaluation Patient Details Name: Alex [name] MRN: [id number] DOB: [birth year] Today's Date: 04/10/2019   History of Present Illness  Patient is 58 y.o. male s/p Rt TKA revision on 04/10/19 with PMH for DM, HTN, PONV, histry of back and cervical spine surgery.    Clinical Impression  Alex [name] is a 58 y.o. male POD 0 s/p Rt TKA revision. Patient reports modified independence with mobility using SPC at baseline and independence with ADL's. Patient is now limited by functional impairments (see PT problem list below) and requires min assist for transfers and gait with RW. Patient was able to ambulate ~50 feet with RW and required repeated cues for safe hand placement on walker during mobility. Patient instructed in exercise to facilitate circulation to manage edema. Patient will benefit from continued skilled PT interventions to address impairments and progress towards PLOF. Acute PT will follow to progress mobility and stair training in preparation for safe discharge home.     Follow Up Recommendations Follow surgeon's recommendation for DC plan and follow-up therapies    Equipment Recommendations  Rolling walker with 5" wheels(bariatric RW)    Recommendations for Other Services       Precautions / Restrictions Precautions Precautions: Fall Restrictions Weight Bearing Restrictions: No      Mobility  Bed Mobility Overal bed mobility: Needs Assistance Bed Mobility: Supine to Sit     Supine to sit: HOB elevated;Min assist     General bed mobility comments: pt required assist for Rt LE mobility and verbal/tactile cues for sequencing, pt reliant on HOB elevated and use of bed rails to transition to upright sitting  Transfers Overall transfer level: Needs assistance Equipment used: Rolling walker (2 wheeled) Transfers: Sit to/from Stand Sit to Stand: Min assist         General transfer comment: verbal cues for safe hand placement and technique, pt requires  repeated cues with tendency to reach forward on walker, pt requires min assist to initiate power up, in standing pt with tendency to lean forward on front of walker requiring cues for safety again to stand upright with safe hand placement  Ambulation/Gait Ambulation/Gait assistance: Min assist Gait Distance (Feet): 50 Feet Assistive device: Rolling walker (2 wheeled) Gait Pattern/deviations: Step-to pattern;Decreased step length - left;Decreased step length - right;Decreased stance time - right;Decreased weight shift to right;Decreased stride length;Antalgic;Trunk flexed Gait velocity: slow and labored   General Gait Details: pt requried cues repeatedly for safe hand placement on RW and ot improve posture while ambulating, pt with 1 standing rest break half way through ambulation  Stairs            Wheelchair Mobility    Modified Rankin (Stroke Patients Only)       Balance Overall balance assessment: Needs assistance Sitting-balance support: Feet supported;No upper extremity supported Sitting balance-Leahy Scale: Fair       Standing balance-Leahy Scale: Poor            Pertinent Vitals/Pain Pain Assessment: Faces Faces Pain Scale: Hurts whole lot Pain Location: Rt knee Pain Descriptors / Indicators: Grimacing;Guarding;Moaning;Aching;Sore Pain Intervention(s): Limited activity within patient's tolerance;Monitored during session;Repositioned;Ice applied;Patient requesting pain meds-RN notified    Home Living Family/patient expects to be discharged to:: Private residence Living Arrangements: Spouse/significant other Available Help at Discharge: Family;Available PRN/intermittently Type of Home: House Home Access: Stairs to enter Entrance Stairs-Rails: None Entrance Stairs-Number of Steps: 1 Home Layout: One level Home Equipment: Shower seat - built in;Walker - 4 wheels;Grab bars - tub/shower;Hand held shower  head Additional Comments: pt's wife available to assist  and pt's daughter lives close by    Prior Function Level of Independence: Independent with assistive device(s)         Comments: pt mobilizing with SPC prior to surgery, pt reports he is independent with ADL's     Hand Dominance   Dominant Hand: Right    Extremity/Trunk Assessment   Upper Extremity Assessment Upper Extremity Assessment: Overall WFL for tasks assessed    Lower Extremity Assessment Lower Extremity Assessment: Generalized weakness;RLE deficits/detail RLE Deficits / Details: pt with poor quad contraction in supine, unable to perform SLR RLE: Unable to fully assess due to pain;Unable to fully assess due to immobilization       Communication   Communication: No difficulties(pt difficult to understand without top denturs in)  Cognition Arousal/Alertness: Awake/alert Behavior During Therapy: WFL for tasks assessed/performed Overall Cognitive Status: Within Functional Limits for tasks assessed             General Comments General comments (skin integrity, edema, etc.): pt experienced BM during bed mobility/transfers, pt was unaware of BM, he required assist to clean and cues to keep hands on walker safely while standing    Exercises Total Joint Exercises Ankle Circles/Pumps: AROM;10 reps;Seated;Both   Assessment/Plan    PT Assessment Patient needs continued PT services  PT Problem List Decreased strength;Decreased balance;Decreased range of motion;Decreased mobility;Decreased activity tolerance;Decreased coordination;Decreased knowledge of use of DME;Pain       PT Treatment Interventions DME instruction;Functional mobility training;Balance training;Patient/family education;Modalities;Gait training;Therapeutic activities;Stair training;Therapeutic exercise    PT Goals (Current goals can be found in the Care Plan section)  Acute Rehab PT Goals Patient Stated Goal: to return home and be independent again PT Goal Formulation: With patient Time For Goal  Achievement: 04/17/19 Potential to Achieve Goals: Good    Frequency 7X/week    AM-PAC PT "6 Clicks" Mobility  Outcome Measure Help needed turning from your back to your side while in a flat bed without using bedrails?: A Little Help needed moving from lying on your back to sitting on the side of a flat bed without using bedrails?: A Little Help needed moving to and from a bed to a chair (including a wheelchair)?: A Little Help needed standing up from a chair using your arms (e.g., wheelchair or bedside chair)?: A Little Help needed to walk in hospital room?: A Little Help needed climbing 3-5 steps with a railing? : A Lot 6 Click Score: 17    End of Session Equipment Utilized During Treatment: Gait belt;Right knee immobilizer Activity Tolerance: Patient limited by pain Patient left: with call bell/phone within reach;in chair;with chair alarm set;with family/visitor present Nurse Communication: Mobility status;Patient requests pain meds PT Visit Diagnosis: Unsteadiness on feet (R26.81);Other abnormalities of gait and mobility (R26.89);Muscle weakness (generalized) (M62.81);Difficulty in walking, not elsewhere classified (R26.2);Pain Pain - Right/Left: Right Pain - part of body: Knee    Time: 1459-1539 PT Time Calculation (min) (ACUTE ONLY): 40 min   Charges:   PT Evaluation $PT Eval Low Complexity: 1 Low PT Treatments $Gait Training: 8-22 mins $Therapeutic Activity: 8-22 mins        Kipp Brood, PT, DPT, [facility] Physical Therapist with [facility]  04/10/2019 4:06 PM

## 2019-04-11 LAB — BASIC METABOLIC PANEL
Anion gap: 5 (ref 5–15)
BUN: 14 mg/dL (ref 6–20)
CO2: 25 mmol/L (ref 22–32)
Calcium: 8.2 mg/dL — ABNORMAL LOW (ref 8.9–10.3)
Chloride: 106 mmol/L (ref 98–111)
Creatinine, Ser: 1 mg/dL (ref 0.61–1.24)
GFR calc Af Amer: >60 mL/min (ref >60)
GFR calc non Af Amer: >60 mL/min (ref >60)
Glucose, Bld: 107 mg/dL — ABNORMAL HIGH (ref 70–99)
Potassium: 4 mmol/L (ref 3.5–5.1)
Sodium: 136 mmol/L (ref 135–145)

## 2019-04-11 LAB — CBC
HCT: 32.9 % — ABNORMAL LOW (ref 39.0–52.0)
Hemoglobin: 10.7 g/dL — ABNORMAL LOW (ref 13.0–17.0)
MCH: 29.3 pg (ref 26.0–34.0)
MCHC: 32.5 g/dL (ref 30.0–36.0)
MCV: 90.1 fL (ref 80.0–100.0)
Platelets: 174 10*3/uL (ref 150–400)
RBC: 3.65 MIL/uL — ABNORMAL LOW (ref 4.22–5.81)
RDW: 13.6 % (ref 11.5–15.5)
WBC: 9.2 10*3/uL (ref 4.0–10.5)
nRBC: 0 % (ref 0.0–0.2)

## 2019-04-11 MED ORDER — ASPIRIN 325 MG PO TBEC: 325.0000 mg | DELAYED_RELEASE_TABLET | Freq: Two times a day (BID) | ORAL | 0 refills | Status: DC

## 2019-04-11 MED ORDER — HYDRALAZINE HCL 25 MG PO TABS
25.0000 mg | ORAL_TABLET | Freq: Four times a day (QID) | ORAL | Status: DC | PRN
  Administered 2019-04-11 – 2019-04-12 (×3): 25 mg via ORAL
  Filled 2019-04-11 (×3): qty 1

## 2019-04-11 MED ORDER — HYDROMORPHONE HCL 2 MG PO TABS
2.0000 mg | ORAL_TABLET | ORAL | Status: DC | PRN
  Administered 2019-04-11 – 2019-04-12 (×5): 2 mg via ORAL
  Filled 2019-04-11 (×5): qty 1

## 2019-04-11 MED ORDER — METHOCARBAMOL 500 MG PO TABS: 500.0000 mg | ORAL_TABLET | Freq: Four times a day (QID) | ORAL | 1 refills | Status: DC | PRN

## 2019-04-11 MED ORDER — HYDROMORPHONE HCL 2 MG PO TABS: 2.0000 mg | ORAL_TABLET | ORAL | 0 refills | Status: DC | PRN

## 2019-04-11 NOTE — Progress Notes (Signed)
    Home health agencies that serve [id number].        [place] Quality of Patient Care Rating Patient Survey Summary Rating  [facility] [phone] 4 out of 5 stars 4 out of [place] [phone] 4  out of 5 stars 3 out of [place] [phone] 4  out of 5 stars 4 out of [place] [phone] 4 out of 5 stars 4 out of [place] [phone] 4 out of 5 stars 4 out of [place] [phone] 4 out of 5 stars 4 out of 5 stars  [facility] [place] [phone] 3  out of 5 stars 4 out of [place] [phone] 3 out of 5 stars 4 out of [place] (336) [phone] 3 out of 5 stars 4 out of [place] [phone] 3  out of 5 stars 4 out of [place] [phone] 4  out of 5 stars 3 out of [place] number Footnote as displayed on [place]  1 This agency provides services under a federal waiver program to non-traditional, chronic long term population.  2 This agency provides services to a special needs population.  3 Not Available.  4 The number of patient episodes for this measure is too small to report.  5 This measure currently does not have data or provider has been certified/recertified for less than 6 months.  6 The national average for this measure is not provided because of state-to-state differences in data collection.  7 Medicare is not displaying rates for this measure for any home health agency, because of an issue with the data.  8 There were problems with the data and they are being corrected.  9 Zero, or very few, patients met the survey's rules for inclusion. The scores shown, if any,  reflect a very small number of surveys and may not accurately tell how an agency is doing.  10 Survey results are based on less than 12 months of data.  11 Fewer than 70 patients completed the survey. Use the scores shown, if any, with caution as the number of surveys may be too low to accurately tell how an agency is doing.  12 No survey results are available for this period.  13 Data suppressed by CMS for one or more quarters.

## 2019-04-11 NOTE — Progress Notes (Addendum)
Jean Rosenthal, MD was paged regarding the pt's BP of 171/91, pulse 95. I will continue to monitor.   Per MD, verbal orders were given for 25 mg of PO Hydralazine q-6 PRN for SBP>160.

## 2019-04-11 NOTE — TOC Initial Note (Signed)
Transition of Care [facility]) - Initial/Assessment Note    Patient Details  Name: Alex [name] MRN: [id number] Date of Birth: 1961/06/12  Transition of Care (TOC) CM/SW Contact:    Joaquin Courts, RN Phone Number: 04/11/2019, 12:42 PM  Clinical Narrative:   CM spoke with patient at bedside. [facility] [place]. Reports has rolling walker at home, [employer] to deliver 3-in-1 to bedside for home use.                 Expected Discharge Plan: [place] Barriers to Discharge: Continued Medical Work up   Patient Goals and CMS Choice Patient states their goals for this hospitalization and ongoing recovery are:: to go home with therapy CMS Medicare.gov Compare Post Acute Care list provided to:: Patient Choice offered to / list presented to : Patient  Expected Discharge Plan and Services Expected Discharge Plan: [place]   Discharge Planning Services: CM Consult Post Acute Care Choice: [place] arrangements for the past 2 months: Single Family Home                 DME Arranged: 3-N-1 DME Agency: [facility] Date DME Agency Contacted: 04/11/19 Time DME Agency Contacted: [phone] Representative spoke with at DME Agency: [place]: PT [place]: [facility] [employer] [facility] [employer]     Representative spoke with at [place]: pre arranged in MD office  Prior Living Arrangements/Services Living arrangements for the past 2 months: [place] with:: Spouse Patient language and need for interpreter reviewed:: Yes Do you feel safe going back to the place where you live?: Yes      Need for Family Participation in Patient Care: Yes (Comment) Care giver support system in place?: Yes (comment)   Criminal Activity/Legal Involvement Pertinent to Current Situation/Hospitalization: No - Comment as needed  Activities of Daily Living Home Assistive Devices/Equipment: Environmental consultant (specify type), Cane (specify  quad or straight), Eyeglasses, Built-in shower seat, Dentures (specify type), Blood pressure cuff ADL Screening (condition at time of admission) Patient's cognitive ability adequate to safely complete daily activities?: Yes Is the patient deaf or have difficulty hearing?: No Does the patient have difficulty seeing, even when wearing glasses/contacts?: No Does the patient have difficulty concentrating, remembering, or making decisions?: No Patient able to express need for assistance with ADLs?: Yes Does the patient have difficulty dressing or bathing?: No Independently performs ADLs?: Yes (appropriate for developmental age) Does the patient have difficulty walking or climbing stairs?: No Weakness of Legs: None Weakness of Arms/Hands: None  Permission Sought/Granted   Permission granted to share information with : Yes, Verbal Permission Granted     Permission granted to share info w AGENCY: [facility]        Emotional Assessment Appearance:: Appears stated age Attitude/Demeanor/Rapport: Engaged Affect (typically observed): Accepting Orientation: : Oriented to Self, Oriented to Place, Oriented to  Time, Oriented to Situation   Psych Involvement: No (comment)  Admission diagnosis:  Right Total Knee Aseptic Loosening Patient Active Problem List   Diagnosis Date Noted  . Status post revision of total replacement of right knee 04/10/2019  . Loose right total knee arthroplasty ([place]) 03/09/2019  . Pain in right knee 02/17/2019  . Cervical spine degeneration 12/30/2018  . Right wrist pain 12/19/2018  . Right arm numbness 12/19/2018  . S/P cervical spinal fusion 03/27/2018  . Morbid obesity ([place]) 02/21/2018  . Diabetes mellitus type 2 in obese ([place]) 03/28/2017  .  Hypertension associated with diabetes (HCC) 08/25/2016   PCP:  Dettinger, Elige Radon, MD Pharmacy:   [facility] [place] - [address] [place] [address] Phone: [phone] Fax:  [phone]  [id number] - [place], [place] - [address] [place] [address] Phone: [phone] Fax: [phone]     Social Determinants of Health (SDOH) Interventions    Readmission Risk Interventions No flowsheet data found.

## 2019-04-11 NOTE — Progress Notes (Signed)
Physical Therapy Treatment Patient Details Name: Alex [name] MRN: [id number] DOB: 1961/03/12 Today's Date: 04/11/2019    History of Present Illness Patient is 58 y.o. male s/p Rt TKA revision on 04/10/19 with PMH for DM, HTN, PONV, histry of back and cervical spine surgery.    PT Comments    The patient is impulsive. Multimodal cues  For safety. Patient reports pain is 6/10. Repositioned and ice placed.   Follow Up Recommendations  Follow surgeon's recommendation for DC plan and follow-up therapies     Equipment Recommendations  3in1 (PT)    Recommendations for Other Services       Precautions / Restrictions Precautions Precautions: Fall;Knee Required Braces or Orthoses: Knee Immobilizer - Right Knee Immobilizer - Right: On when out of bed or walking    Mobility  Bed Mobility               General bed mobility comments: sitting on bed edge  Transfers Overall transfer level: Needs assistance Equipment used: Rolling walker (2 wheeled) Transfers: Sit to/from Stand Sit to Stand: Min assist         General transfer comment: cues for safety, attempting to stand from bed without RW, KI is very low on leg  Ambulation/Gait Ambulation/Gait assistance: Min assist Gait Distance (Feet): 120 Feet Assistive device: Rolling walker (2 wheeled) Gait Pattern/deviations: Step-to pattern;Antalgic;Decreased stance time - right     General Gait Details: cues for safety   Stairs             Wheelchair Mobility    Modified Rankin (Stroke Patients Only)       Balance                                            Cognition   Behavior During Therapy: Impulsive                                   General Comments: pt. found getting to bed edge, attempted to stand w/out RW      Exercises Total Joint Exercises Hip ABduction/ADduction: AROM;Right;10 reps;Supine Straight Leg Raises: AAROM;Right;10 reps;Supine Knee Flexion:  AAROM;Right;10 reps;Seated Goniometric ROM: 10-70 seated knee flexion    General Comments        Pertinent Vitals/Pain Faces Pain Scale: Hurts even more Pain Location: Rt knee Pain Descriptors / Indicators: Grimacing;Guarding;Moaning;Aching;Sore Pain Intervention(s): Limited activity within patient's tolerance;Monitored during session;Premedicated before session;Ice applied    Home Living                      Prior Function            PT Goals (current goals can now be found in the care plan section)      Frequency    7X/week      PT Plan Current plan remains appropriate    Co-evaluation              AM-PAC PT "6 Clicks" Mobility   Outcome Measure  Help needed turning from your back to your side while in a flat bed without using bedrails?: A Little Help needed moving from lying on your back to sitting on the side of a flat bed without using bedrails?: A Little Help needed moving to and from a bed to a chair (including a  wheelchair)?: A Little Help needed standing up from a chair using your arms (e.g., wheelchair or bedside chair)?: A Little Help needed to walk in hospital room?: A Little Help needed climbing 3-5 steps with a railing? : A Lot 6 Click Score: 17    End of Session Equipment Utilized During Treatment: Gait belt;Right knee immobilizer Activity Tolerance: Patient tolerated treatment well Patient left: in bed;with call bell/phone within reach;with bed alarm set Nurse Communication: Mobility status PT Visit Diagnosis: Unsteadiness on feet (R26.81);Other abnormalities of gait and mobility (R26.89);Muscle weakness (generalized) (M62.81);Difficulty in walking, not elsewhere classified (R26.2);Pain Pain - Right/Left: Right Pain - part of body: Knee     Time: 5625-6389 PT Time Calculation (min) (ACUTE ONLY): 51 min  Charges:  $Gait Training: 23-37 mins $Therapeutic Exercise: 8-22 mins                     Tresa Endo PT Acute  Rehabilitation Services Pager [phone] Office [phone]    Claretha Cooper 04/11/2019, 3:28 PM

## 2019-04-11 NOTE — Progress Notes (Signed)
Physical Therapy Treatment Patient Details Name: Alex [name] MRN: [id number] DOB: Oct 24, 1960 Today's Date: 04/11/2019    History of Present Illness Patient is 58 y.o. male s/p Rt TKA revision on 04/10/19 with PMH for DM, HTN, PONV, histry of back and cervical spine surgery.    PT Comments    Patient is focussed on right knee pain and wanting to speak with Dr. Ninfa Linden who has already been in. Patient continues to be impulsive. Cues for safety.   Continue  Progressing mobility and encouraging safety.   Follow Up Recommendations  Follow surgeon's recommendation for DC plan and follow-up therapies     Equipment Recommendations  3in1 (PT)    Recommendations for Other Services       Precautions / Restrictions Precautions Precautions: Fall;Knee Required Braces or Orthoses: Knee Immobilizer - Right Knee Immobilizer - Right: On when out of bed or walking    Mobility  Bed Mobility Overal bed mobility: Needs Assistance Bed Mobility: Supine to Sit;Sit to Supine     Supine to sit: HOB elevated;Min assist     General bed mobility comments: asking for  assist with right leg and trunk, assisted the right  leg onto bed.  Transfers Overall transfer level: Needs assistance Equipment used: Rolling walker (2 wheeled) Transfers: Sit to/from Stand Sit to Stand: Min assist         General transfer comment: cues for safety, attempting to stand from bed without RW,  Ambulation/Gait Ambulation/Gait assistance: Min assist Gait Distance (Feet): 40 Feet Assistive device: Rolling walker (2 wheeled) Gait Pattern/deviations: Step-to pattern;Antalgic;Decreased stance time - right     General Gait Details: cues for safety   Stairs             Wheelchair Mobility    Modified Rankin (Stroke Patients Only)       Balance                                            Cognition Arousal/Alertness: Awake/alert Behavior During Therapy: Impulsive Overall  Cognitive Status: Within Functional Limits for tasks assessed                                 General Comments: pt. found getting to bed edge, attempted to stand w/out RW      Exercises   General Comments        Pertinent Vitals/Pain Pain Score: 10-Worst pain ever Faces Pain Scale: Hurts even more Pain Location: Rt knee, asking for Dr. Ninfa Linden to be called, RN aware Pain Descriptors / Indicators: Grimacing;Guarding;Moaning;Aching;Sore Pain Intervention(s): Monitored during session;Repositioned;Ice applied;Patient requesting pain meds-RN notified    Home Living                      Prior Function            PT Goals (current goals can now be found in the care plan section) Progress towards PT goals: Progressing toward goals    Frequency    7X/week      PT Plan Current plan remains appropriate    Co-evaluation              AM-PAC PT "6 Clicks" Mobility   Outcome Measure  Help needed turning from your back to your side while in a flat bed without using bedrails?:  A Little Help needed moving from lying on your back to sitting on the side of a flat bed without using bedrails?: A Little Help needed moving to and from a bed to a chair (including a wheelchair)?: A Little Help needed standing up from a chair using your arms (e.g., wheelchair or bedside chair)?: A Little Help needed to walk in hospital room?: A Lot Help needed climbing 3-5 steps with a railing? : A Lot 6 Click Score: 14    End of Session Equipment Utilized During Treatment: Right knee immobilizer Activity Tolerance: Patient limited by pain Patient left: in bed;with call bell/phone within reach;with bed alarm set;with family/visitor present Nurse Communication: Mobility status(asking for Dr. Magnus Ivan) PT Visit Diagnosis: Unsteadiness on feet (R26.81);Other abnormalities of gait and mobility (R26.89);Muscle weakness (generalized) (M62.81);Difficulty in walking, not elsewhere  classified (R26.2);Pain Pain - Right/Left: Right Pain - part of body: Knee     Time: 1430-1457 PT Time Calculation (min) (ACUTE ONLY): 27 min  Charges:  $Gait Training: 23-37 mins $Therapeutic Exercise: 8-22 mins                     Blanchard Kelch PT Acute Rehabilitation Services Pager [phone] Office [phone]    Rada Hay 04/11/2019, 3:38 PM

## 2019-04-11 NOTE — Progress Notes (Signed)
Pt has made multiple comments regarding leaving AMA d/t poor pain management per the pt. Jean Rosenthal, MD was notified. Cecille Rubin, the [facility] was also notified. Pt has been medicated appropriately per the MD's orders (see MAR). Oncoming RN made aware.

## 2019-04-11 NOTE — Progress Notes (Signed)
Patient has asked about getting IV Dilaudid many times this evening. Patient received IV Dilaudid at 2028 (04/10/19). Patient also received reinforced education about IV Dilaudid being for "emergency pain use." Educated patient that he would most likely not be discharged with an IV. Therefore, explained to patient the goal of being able to manage pain via the oral route prior to discharge.

## 2019-04-11 NOTE — Discharge Instructions (Signed)

## 2019-04-11 NOTE — Progress Notes (Signed)
Patient ID: Alex [name], male   DOB: 12-Feb-1961, 58 y.o.   MRN: [id number] Patient is a 58 year old gentleman who is status post revision right total knee arthroplasty.  Patient is progressing well anticipate discharge to home on Sunday.

## 2019-04-12 NOTE — Progress Notes (Signed)
Patient ID: Alex [name], male   DOB: [birth year], 58 y.o.   MRN: [id number] Patient is status post revision right total knee arthroplasty he states he still has significant amount of pain he states he is getting better anticipate discharge to home on Monday.

## 2019-04-12 NOTE — Progress Notes (Signed)
PT Cancellation Note  Patient Details Name: Alex [name] MRN: [id number] DOB: 1960/10/18   Cancelled Treatment:    Reason Eval/Treat Not Completed: Pain limiting ability to participate  Pt refuses stating his pain is too bad and that he needs more meds.   [facility] 04/12/2019, 3:30 PM

## 2019-04-12 NOTE — Progress Notes (Signed)
Physical Therapy Treatment Patient Details Name: Alex [name] MRN: [id number] DOB: 14-Jun-1961 Today's Date: 04/12/2019    History of Present Illness Patient is 58 y.o. male s/p Rt TKA revision on 04/10/19 with PMH for DM, HTN, PONV, histry of back and cervical spine surgery.    PT Comments    Pt progressing slowly d/t pain; he is requiring less assist overall today, slow but steady gait with multiple pt driven rest breaks  d/t pain   Follow Up Recommendations  Follow surgeon's recommendation for DC plan and follow-up therapies     Equipment Recommendations  3in1 (PT)    Recommendations for Other Services       Precautions / Restrictions Precautions Precautions: Fall;Knee Required Braces or Orthoses: Knee Immobilizer - Right Knee Immobilizer - Right: On when out of bed or walking;Discontinue once straight leg raise with < 10 degree lag Restrictions Weight Bearing Restrictions: No Other Position/Activity Restrictions: WBAT    Mobility  Bed Mobility Overal bed mobility: Needs Assistance Bed Mobility: Supine to Sit     Supine to sit: HOB elevated;Min assist     General bed mobility comments: assisted RLE off bed  Transfers Overall transfer level: Needs assistance Equipment used: Rolling walker (2 wheeled) Transfers: Sit to/from Stand Sit to Stand: Supervision;Min guard         General transfer comment: cues for safety and hand placement, pt is intermittently noncompliant  Ambulation/Gait Ambulation/Gait assistance: Min guard Gait Distance (Feet): 80 Feet Assistive device: Rolling walker (2 wheeled) Gait Pattern/deviations: Step-to pattern;Antalgic;Decreased stance time - right     General Gait Details: pt with slow but steady gait. takes ~ 5 rest breaks in distance above d/t pain per pt   Stairs             Wheelchair Mobility    Modified Rankin (Stroke Patients Only)       Balance                                             Cognition Arousal/Alertness: Awake/alert Behavior During Therapy: Agitated Overall Cognitive Status: Within Functional Limits for tasks assessed                                 General Comments: pt states he needs to rest and let his knee heal. he states he is angry and agitated at PT for attempting to get him OOB      Exercises      General Comments        Pertinent Vitals/Pain Pain Assessment: 0-10 Pain Score: 8  Pain Location: Rt knee, asking for Dr. Ninfa Linden to be called, RN aware Pain Descriptors / Indicators: Discomfort;Grimacing;Guarding;Burning;Sore;Sharp;Shooting Pain Intervention(s): Limited activity within patient's tolerance;Monitored during session;Premedicated before session;Repositioned;Ice applied    Home Living                      Prior Function            PT Goals (current goals can now be found in the care plan section) Acute Rehab PT Goals Patient Stated Goal: to return home and be independent again PT Goal Formulation: With patient Time For Goal Achievement: 04/17/19 Potential to Achieve Goals: Good Progress towards PT goals: Progressing toward goals    Frequency    7X/week  PT Plan Current plan remains appropriate    Co-evaluation              AM-PAC PT "6 Clicks" Mobility   Outcome Measure  Help needed turning from your back to your side while in a flat bed without using bedrails?: A Little Help needed moving from lying on your back to sitting on the side of a flat bed without using bedrails?: A Little Help needed moving to and from a bed to a chair (including a wheelchair)?: A Little Help needed standing up from a chair using your arms (e.g., wheelchair or bedside chair)?: A Little Help needed to walk in hospital room?: A Little Help needed climbing 3-5 steps with a railing? : A Lot 6 Click Score: 17    End of Session Equipment Utilized During Treatment: Right knee immobilizer;Gait  belt Activity Tolerance: Patient tolerated treatment well Patient left: in chair;with call bell/phone within reach;with chair alarm set   PT Visit Diagnosis: Unsteadiness on feet (R26.81);Other abnormalities of gait and mobility (R26.89);Muscle weakness (generalized) (M62.81);Difficulty in walking, not elsewhere classified (R26.2);Pain Pain - Right/Left: Right Pain - part of body: Knee     Time: 1116-1140 PT Time Calculation (min) (ACUTE ONLY): 24 min  Charges:  $Gait Training: 23-37 mins                     Drucilla Chalet, PT  Pager: [phone] Acute Rehab Dept [facility]): [phone]   04/12/2019    [facility] 04/12/2019, 12:02 PM

## 2019-04-13 ENCOUNTER — Encounter (HOSPITAL_COMMUNITY): Payer: Self-pay | Admitting: Orthopaedic Surgery

## 2019-04-13 NOTE — Progress Notes (Signed)
Patient ID: Alex [name], male   DOB: 06-26-1961, 58 y.o.   MRN: [id number] No acute changes. Having difficulty with pain management, but this was expected coming into surgery.  His right knee is stable overall.  Can be discharged to home today.

## 2019-04-13 NOTE — Progress Notes (Signed)
Physical Therapy Treatment Patient Details Name: Alex [name] MRN: [id number] DOB: [birth year] Today's Date: 04/13/2019    History of Present Illness Patient is 58 y.o. male s/p Rt TKA revision on 04/10/19 with PMH for DM, HTN, PONV, histry of back and cervical spine surgery.    PT Comments    POD 3 pm session Assisted with car transfer and Family ed to spouse on HEP, addressed all mobility questions, discussed appropriate activity, educated on use of ICE.  Pt ready for D/C to home.   Follow Up Recommendations  Follow surgeon's recommendation for DC plan and follow-up therapies     Equipment Recommendations  3in1 (PT);Rolling walker with 5" wheels(pt today stated his walker at home has 4 wheels)    Recommendations for Other Services       Precautions / Restrictions Precautions Precautions: Fall;Knee Precaution Comments: did not use KI this session Restrictions Weight Bearing Restrictions: No Other Position/Activity Restrictions: WBAT    Mobility  Bed Mobility OOB in wheelchairTransfers Overall transfer level: Needs assistance Equipment used: Rolling walker (2 wheeled) Transfers: Sit to/from Stand Sit to Stand: Supervision;Min guard         General transfer comment: 25% VC's on safety with turns and proper walker to self placement during turns.  Pt impulsive  Ambulation/Gait Ambulation/Gait assistance: Supervision Gait Distance (Feet): 3 Feet from wc to car  Assistive device: Rolling walker (2 wheeled) Gait Pattern/deviations: Step-to pattern;Antalgic;Decreased stance time - right Gait velocity: decreased   General Gait Details: 25% VC's on upright posture and safety with turns.  Pt impulsive.     Stairs  Wheelchair Mobility    Modified Rankin (Stroke Patients Only)       Balance                                            Cognition Arousal/Alertness: Awake/alert Behavior During Therapy: Agitated Overall Cognitive Status: Within  Functional Limits for tasks assessed                                 General Comments: pt easily agitated and brunt.      Exercises      General Comments        Pertinent Vitals/Pain Pain Assessment: 0-10 Pain Score: 8  Pain Location: R knee and upper thigh Pain Descriptors / Indicators: Discomfort;Grimacing;Guarding;Burning;Sore;Sharp;Shooting Pain Intervention(s): Monitored during session;Repositioned;Ice applied    Home Living                      Prior Function            PT Goals (current goals can now be found in the care plan section) Progress towards PT goals: Progressing toward goals    Frequency    7X/week      PT Plan Current plan remains appropriate    Co-evaluation              AM-PAC PT "6 Clicks" Mobility   Outcome Measure  Help needed turning from your back to your side while in a flat bed without using bedrails?: A Little Help needed moving from lying on your back to sitting on the side of a flat bed without using bedrails?: A Little Help needed moving to and from a bed to a chair (including a wheelchair)?: A Little Help  needed standing up from a chair using your arms (e.g., wheelchair or bedside chair)?: A Little Help needed to walk in hospital room?: A Little Help needed climbing 3-5 steps with a railing? : A Little 6 Click Score: 18    End of Session Equipment Utilized During Treatment: Gait belt Activity Tolerance: Patient tolerated treatment well Patient left: in car;with call bell/phone within reach Nurse Communication: Mobility status PT Visit Diagnosis: Unsteadiness on feet (R26.81);Other abnormalities of gait and mobility (R26.89);Muscle weakness (generalized) (M62.81);Difficulty in walking, not elsewhere classified (R26.2);Pain Pain - Right/Left: Right Pain - part of body: Knee     Time: 1204-1218 PT Time Calculation (min) (ACUTE ONLY): 14 min  Charges:   $Therapeutic Activity: 8-22 mins                      Felecia Shelling  PTA Acute  Rehabilitation Services Pager      [phone] Office      [phone]

## 2019-04-13 NOTE — Discharge Summary (Signed)
Patient ID: Alex [name] MRN: [id number] DOB/AGE: [birth year] 58 y.o.  Admit date: 04/10/2019 Discharge date: 04/13/2019  Admission Diagnoses:  Principal Problem:   Loose right total knee arthroplasty [facility]) Active Problems:   Status post revision of total replacement of right knee   Discharge Diagnoses:  Same  Past Medical History:  Diagnosis Date  . Diabetes mellitus without complication ([place])   . History of kidney stones    removed 5 months ago  . Hypertension   . PONV (postoperative nausea and vomiting)    AGITATION after anesthesia as well  . Sleep apnea    NO CPAP    Surgeries: Procedure(s): RIGHT TOTAL KNEE REVISION on 04/10/2019   Consultants:   Discharged Condition: Improved  Hospital Course: Alex [name] is an 58 y.o. male who was admitted 04/10/2019 for operative treatment ofLoose right total knee arthroplasty ([place]). Patient has severe unremitting pain that affects sleep, daily activities, and work/hobbies. After pre-op clearance the patient was taken to the operating room on 04/10/2019 and underwent  Procedure(s): RIGHT TOTAL KNEE REVISION.    Patient was given perioperative antibiotics:  Anti-infectives (From admission, onward)   Start     Dose/Rate Route Frequency Ordered Stop   04/10/19 1430  ceFAZolin (ANCEF) IVPB 1 g/50 mL premix     1 g 100 mL/hr over 30 Minutes Intravenous Every 6 hours 04/10/19 1337 04/10/19 2057   04/10/19 0846  ceFAZolin (ANCEF) 2-4 GM/100ML-% IVPB    Note to Pharmacy: Bridget Hartshorn   : cabinet override      04/10/19 0846 04/10/19 2059       Patient was given sequential compression devices, early ambulation, and chemoprophylaxis to prevent DVT.  Patient benefited maximally from hospital stay and there were no complications.    Recent vital signs:  Patient Vitals for the past 24 hrs:  BP Temp Temp src Pulse Resp SpO2  04/13/19 0550 (!) 160/98 98.3 F (36.8 C) Oral 76 16 100 %  04/12/19 2113 (!) 150/87 99.1 F (37.3 C) Oral  89 16 100 %  04/12/19 1755 (!) 176/96 98.5 F (36.9 C) Oral 79 16 98 %  04/12/19 1612 - 98.5 F (36.9 C) Oral - - -  04/12/19 1453 (!) 171/98 99.8 F (37.7 C) Oral 96 16 100 %     Recent laboratory studies:  Recent Labs    04/11/19 0308  WBC 9.2  HGB 10.7*  HCT 32.9*  PLT 174  NA 136  K 4.0  CL 106  CO2 25  BUN 14  CREATININE 1.00  GLUCOSE 107*  CALCIUM 8.2*     Discharge Medications:   Allergies as of 04/13/2019      Reactions   Morphine And Related Nausea Only   Buprenorphine Hcl-naloxone Hcl Nausea Only   Codeine Nausea Only, Other (See Comments)   per pt, "felt weird in my head"      Medication List    TAKE these medications   aspirin 325 MG EC tablet Take 1 tablet (325 mg total) by mouth 2 (two) times daily at 10 am and 4 pm.   HYDROcodone-acetaminophen 5-325 MG tablet Commonly known as: NORCO/VICODIN Take 1 tablet by mouth 3 (three) times daily as needed for moderate pain.   HYDROmorphone 2 MG tablet Commonly known as: DILAUDID Take 1 tablet (2 mg total) by mouth every 4 (four) hours as needed for severe pain.   ketoconazole 2 % shampoo Commonly known as: NIZORAL Apply topically 2 (two) times a week. What changed: See  the new instructions.   lisinopril 40 MG tablet Commonly known as: ZESTRIL Take 1 tablet (40 mg total) by mouth daily.   metFORMIN 500 MG tablet Commonly known as: GLUCOPHAGE TAKE 2 TABLET BY MOUTH TWICE A DAY   methocarbamol 500 MG tablet Commonly known as: ROBAXIN Take 1 tablet (500 mg total) by mouth every 6 (six) hours as needed for muscle spasms.   OVER THE COUNTER MEDICATION Take 2 capsules by mouth daily. Testosterone support   OVER THE COUNTER MEDICATION Take 2 capsules by mouth daily. Peak Male Testosterone support   oxymetazoline 0.05 % nasal spray Commonly known as: AFRIN Place 1 spray into both nostrils every 6 (six) hours as needed for congestion.   pregabalin 75 MG capsule Commonly known as: LYRICA Take 1  capsule (75 mg total) by mouth 2 (two) times daily as needed (for neuropathy pain).   tiZANidine 4 MG tablet Commonly known as: Zanaflex Take 1 tablet (4 mg total) by mouth 2 (two) times daily as needed for muscle spasms.            Durable Medical Equipment  (From admission, onward)         Start     Ordered   04/10/19 1338  DME Walker rolling  Once    Question:  Patient needs a walker to treat with the following condition  Answer:  Status post revision of total replacement of right knee   04/10/19 1337   04/10/19 1338  DME 3 n 1  Once     04/10/19 1337          Diagnostic Studies: Dg Knee Right Port  Result Date: 04/10/2019 CLINICAL DATA:  Postop right total knee replacement EXAM: PORTABLE RIGHT KNEE - 1-2 VIEW COMPARISON:  None. FINDINGS: The knee demonstrates a total knee arthroplasty without evidence of hardware failure complication. There is no significant joint effusion. There is no fracture or dislocation. The alignment is anatomic. Post-surgical changes noted in the surrounding soft tissues. IMPRESSION: Interval right total knee arthroplasty. Electronically Signed   By: Elige Ko   On: 04/10/2019 12:34    Disposition: Discharge disposition: 01-Home or Self Care         Follow-up Information    Kathryne Hitch, MD Follow up in 2 week(s).   Specialty: Orthopedic Surgery Contact information: [address] [place] [address] [phone]        [facility] Follow up.   Specialty: Home Health Services Why: agency will provide home health physical therapy, agency will call you to schedule first visit. Contact information: [address] [place] [address] [phone]            Signed: Kathryne Hitch 04/13/2019, 7:25 AM

## 2019-04-13 NOTE — Care Management Important Message (Signed)
Important Message  Patient Details IM Letter given to Kathrin Greathouse to present to the Patient Name: Alex [name] MRN: [id number] Date of Birth: October 08, 1960   Medicare Important Message Given:  Yes     Kerin Salen 04/13/2019, 11:33 AM

## 2019-04-13 NOTE — Progress Notes (Signed)
Physical Therapy Treatment Patient Details Name: Alex [name] MRN: [id number] DOB: August 28, 1960 Today's Date: 04/13/2019    History of Present Illness Patient is 58 y.o. male s/p Rt TKA revision on 04/10/19 with PMH for DM, HTN, PONV, histry of back and cervical spine surgery.    PT Comments    POD # 3 am session Assisted OOB to amb in hallway, practiced one step Then returned to room to perform some TE's following HEP handout.  Instructed on proper tech, freq as well as use of ICE.     Follow Up Recommendations  Follow surgeon's recommendation for DC plan and follow-up therapies     Equipment Recommendations  3in1 (PT);Rolling walker with 5" wheels(pt today stated his walker at home has 4 wheels)    Recommendations for Other Services       Precautions / Restrictions Precautions Precautions: Fall;Knee Precaution Comments: did not use KI this session Restrictions Weight Bearing Restrictions: No Other Position/Activity Restrictions: WBAT    Mobility  Bed Mobility Overal bed mobility: Needs Assistance Bed Mobility: Supine to Sit;Sit to Supine     Supine to sit: Supervision Sit to supine: Supervision;Min guard   General bed mobility comments: demonstarted how to use a belt loop to self assist R LE off and onto bed  Transfers Overall transfer level: Needs assistance Equipment used: Rolling walker (2 wheeled) Transfers: Sit to/from Stand Sit to Stand: Supervision;Min guard         General transfer comment: 25% VC's on safety with turns and proper walker to self placement during turns.  Pt impulsive  Ambulation/Gait Ambulation/Gait assistance: Supervision Gait Distance (Feet): 68 Feet Assistive device: Rolling walker (2 wheeled) Gait Pattern/deviations: Step-to pattern;Antalgic;Decreased stance time - right Gait velocity: decreased   General Gait Details: 25% VC's on upright posture and safety with turns   Stairs Stairs: Yes Stairs assistance: Supervision;Min  guard Stair Management: No rails;Step to pattern;Backwards;With walker Number of Stairs: 1 General stair comments: 50% VC's on proper tech and walker placement to go up one step backward   Wheelchair Mobility    Modified Rankin (Stroke Patients Only)       Balance                                            Cognition Arousal/Alertness: Awake/alert Behavior During Therapy: Agitated Overall Cognitive Status: Within Functional Limits for tasks assessed                                 General Comments: pt easily agitated and brunt.      Exercises   Total Knee Replacement TE's 10 reps B LE ankle pumps 10 reps towel squeezes 5 reps knee presses 5 reps heel slides  5 reps SAQ's 5 reps SLR's 5 reps ABD Followed by ICE    General Comments        Pertinent Vitals/Pain Pain Assessment: 0-10 Pain Score: 8  Pain Location: R knee and upper thigh Pain Descriptors / Indicators: Discomfort;Grimacing;Guarding;Burning;Sore;Sharp;Shooting Pain Intervention(s): Monitored during session;Repositioned;Ice applied    Home Living                      Prior Function            PT Goals (current goals can now be found in the care plan section)  Progress towards PT goals: Progressing toward goals    Frequency    7X/week      PT Plan Current plan remains appropriate    Co-evaluation              AM-PAC PT "6 Clicks" Mobility   Outcome Measure  Help needed turning from your back to your side while in a flat bed without using bedrails?: A Little Help needed moving from lying on your back to sitting on the side of a flat bed without using bedrails?: A Little Help needed moving to and from a bed to a chair (including a wheelchair)?: A Little Help needed standing up from a chair using your arms (e.g., wheelchair or bedside chair)?: A Little Help needed to walk in hospital room?: A Little Help needed climbing 3-5 steps with a  railing? : A Little 6 Click Score: 18    End of Session Equipment Utilized During Treatment: Gait belt Activity Tolerance: Patient tolerated treatment well Patient left: in bed;with call bell/phone within reach Nurse Communication: Mobility status PT Visit Diagnosis: Unsteadiness on feet (R26.81);Other abnormalities of gait and mobility (R26.89);Muscle weakness (generalized) (M62.81);Difficulty in walking, not elsewhere classified (R26.2);Pain Pain - Right/Left: Right Pain - part of body: Knee     Time: 1610-9604 PT Time Calculation (min) (ACUTE ONLY): 46 min  Charges:  $Gait Training: 8-22 mins $Therapeutic Exercise: 8-22 mins $Therapeutic Activity: 8-22 mins                     Rica Koyanagi  PTA Acute  Rehabilitation Services Pager      [phone] Office      [phone]

## 2019-04-13 NOTE — TOC Transition Note (Signed)
Transition of Care [facility]) - CM/SW Discharge Note   Patient Details  Name: Alex [name] MRN: [id number] Date of Birth: [birth year]  Transition of Care [facility]) CM/SW Contact:  Lia Hopping, [place] Phone Number: 04/13/2019, 10:26 AM   Clinical Narrative:    Patient now reports he does not have a RW.  RW ordered through [place], [place] will be delivered to the patient room.    Final next level of care: [place] Barriers to Discharge: Continued Medical Work up   Patient Goals and CMS Choice Patient states their goals for this hospitalization and ongoing recovery are:: to go home with therapy CMS Medicare.gov Compare Post Acute Care list provided to:: Patient Choice offered to / list presented to : Patient  Discharge Placement  Home                      Discharge Plan and Services   Discharge Planning Services: CM Consult Post Acute Care Choice: Home Health          DME Arranged: Walker rolling DME Agency: [employer] Date DME Agency Contacted: 04/13/19 Time DME Agency Contacted: 83 Representative spoke with at DME Agency: [place]: PT [place]: [facility])     Representative spoke with at [place]: pre arranged in MD office  Social Determinants of Health ([place]) Interventions     Readmission Risk Interventions No flowsheet data found.

## 2019-04-15 ENCOUNTER — Telehealth: Payer: Self-pay

## 2019-04-15 ENCOUNTER — Other Ambulatory Visit: Payer: Self-pay | Admitting: Specialist

## 2019-04-15 ENCOUNTER — Telehealth: Payer: Self-pay | Admitting: Orthopaedic Surgery

## 2019-04-15 DIAGNOSIS — M5030 Other cervical disc degeneration, unspecified cervical region: Secondary | ICD-10-CM | POA: Diagnosis not present

## 2019-04-15 DIAGNOSIS — E119 Type 2 diabetes mellitus without complications: Secondary | ICD-10-CM | POA: Diagnosis not present

## 2019-04-15 DIAGNOSIS — I10 Essential (primary) hypertension: Secondary | ICD-10-CM | POA: Diagnosis not present

## 2019-04-15 DIAGNOSIS — Z7984 Long term (current) use of oral hypoglycemic drugs: Secondary | ICD-10-CM | POA: Diagnosis not present

## 2019-04-15 DIAGNOSIS — Z6835 Body mass index (BMI) 35.0-35.9, adult: Secondary | ICD-10-CM | POA: Diagnosis not present

## 2019-04-15 DIAGNOSIS — G4730 Sleep apnea, unspecified: Secondary | ICD-10-CM | POA: Diagnosis not present

## 2019-04-15 DIAGNOSIS — Z7901 Long term (current) use of anticoagulants: Secondary | ICD-10-CM | POA: Diagnosis not present

## 2019-04-15 DIAGNOSIS — T84032D Mechanical loosening of internal right knee prosthetic joint, subsequent encounter: Secondary | ICD-10-CM | POA: Diagnosis not present

## 2019-04-15 MED ORDER — ONDANSETRON HCL 4 MG PO TABS: 4.0000 mg | ORAL_TABLET | Freq: Three times a day (TID) | ORAL | 0 refills | Status: DC | PRN

## 2019-04-15 MED ORDER — HYDROCODONE-ACETAMINOPHEN 5-325 MG PO TABS: 1.0000 | ORAL_TABLET | Freq: Four times a day (QID) | ORAL | 0 refills | Status: DC | PRN

## 2019-04-15 MED ORDER — HYDROCODONE-ACETAMINOPHEN 7.5-325 MG PO TABS: 1.0000 | ORAL_TABLET | Freq: Four times a day (QID) | ORAL | 0 refills | Status: DC | PRN

## 2019-04-15 NOTE — Telephone Encounter (Signed)
See below

## 2019-04-15 NOTE — Telephone Encounter (Signed)
Patient called very upset stating that the medication Dr. Ninfa Linden prescribed for his pain does not work and that he told him in the hospital it did not work.  He wants the Hydrocodone that he had before.  CB#[phone]

## 2019-04-15 NOTE — Telephone Encounter (Signed)
Verbal order given  

## 2019-04-15 NOTE — Telephone Encounter (Signed)
I sent in some more hydrocodone for him then instead.

## 2019-04-15 NOTE — Telephone Encounter (Signed)
Alex [name] with [facility] would like verbal orders for   2 x wk for 1wk, 3 x wk for 1wk, and 1 x wk for 1wk for strengthening, ROM, mobility training, and patient education.  Cb# is [phone].  Please advise.  Thank you.

## 2019-04-16 ENCOUNTER — Encounter: Payer: Self-pay | Admitting: Family Medicine

## 2019-04-16 ENCOUNTER — Telehealth: Payer: Self-pay | Admitting: Family Medicine

## 2019-04-16 ENCOUNTER — Ambulatory Visit (INDEPENDENT_AMBULATORY_CARE_PROVIDER_SITE_OTHER): Payer: Medicare HMO | Admitting: Family Medicine

## 2019-04-16 DIAGNOSIS — I10 Essential (primary) hypertension: Secondary | ICD-10-CM

## 2019-04-16 DIAGNOSIS — I152 Hypertension secondary to endocrine disorders: Secondary | ICD-10-CM

## 2019-04-16 DIAGNOSIS — E1159 Type 2 diabetes mellitus with other circulatory complications: Secondary | ICD-10-CM

## 2019-04-16 MED ORDER — HYDRALAZINE HCL 25 MG PO TABS: 25.0000 mg | ORAL_TABLET | Freq: Three times a day (TID) | ORAL | 0 refills | Status: DC

## 2019-04-16 NOTE — Progress Notes (Signed)
Virtual Visit via telephone Note  I connected with Alex [name] on 04/16/19 at 1405 by telephone and verified that I am speaking with the correct person using two identifiers. Alex [name] is currently located at home and no other people are currently with her during visit. The provider, Alex Kaufmann Izeah Vossler, MD is located in their office at time of visit.  Call ended at 1419  I discussed the limitations, risks, security and privacy concerns of performing an evaluation and management service by telephone and the availability of in person appointments. I also discussed with the patient that there may be a patient responsible charge related to this service. The patient expressed understanding and agreed to proceed.   History and Present Illness: Hypertension Patient is currently on lisinopril, and their blood pressure today is 200/11yesterday and 187/11,hr 76, 182/103. Patient denies any lightheadedness or dizziness. Patient denies headaches, blurred vision, chest pains, shortness of breath, or weakness. Denies any side effects from medication and is content with current medication.   His right leg is swollen after surgery and he is having pain and his bp is up after surgery. He is having trouble controlling his post-op pain. He left leg is very swollen after the surgery.   No diagnosis found.  Outpatient Encounter Medications as of 04/16/2019  Medication Sig  . aspirin EC 325 MG EC tablet Take 1 tablet (325 mg total) by mouth 2 (two) times daily at 10 am and 4 pm.  . HYDROcodone-acetaminophen (NORCO/VICODIN) 5-325 MG tablet Take 1-2 tablets by mouth every 6 (six) hours as needed for up to 7 days for moderate pain.  Marland Kitchen ketoconazole (NIZORAL) 2 % shampoo Apply topically 2 (two) times a week. (Patient taking differently: Apply 1 application topically 2 (two) times a week. )  . lisinopril (ZESTRIL) 40 MG tablet Take 1 tablet (40 mg total) by mouth daily.  . metFORMIN (GLUCOPHAGE) 500 MG tablet  TAKE 2 TABLET BY MOUTH TWICE A DAY  . methocarbamol (ROBAXIN) 500 MG tablet Take 1 tablet (500 mg total) by mouth every 6 (six) hours as needed for muscle spasms.  . ondansetron (ZOFRAN) 4 MG tablet Take 1 tablet (4 mg total) by mouth every 8 (eight) hours as needed for nausea or vomiting.  Marland Kitchen OVER THE COUNTER MEDICATION Take 2 capsules by mouth daily. Testosterone support  . OVER THE COUNTER MEDICATION Take 2 capsules by mouth daily. Peak Male Testosterone support  . oxymetazoline (AFRIN) 0.05 % nasal spray Place 1 spray into both nostrils every 6 (six) hours as needed for congestion.   . pregabalin (LYRICA) 75 MG capsule Take 1 capsule (75 mg total) by mouth 2 (two) times daily as needed (for neuropathy pain).  Marland Kitchen tiZANidine (ZANAFLEX) 4 MG tablet Take 1 tablet (4 mg total) by mouth 2 (two) times daily as needed for muscle spasms.   No facility-administered encounter medications on file as of 04/16/2019.     Review of Systems  Constitutional: Negative for chills and fever.  Respiratory: Negative for shortness of breath and wheezing.   Cardiovascular: Negative for chest pain and leg swelling.  Musculoskeletal: Positive for arthralgias and joint swelling. Negative for back pain and gait problem.  Skin: Negative for rash.  Neurological: Negative for dizziness, weakness, light-headedness and headaches.  All other systems reviewed and are negative.   Observations/Objective: Patient sounds good and in no acute distress  Assessment and Plan: Problem List Items Addressed This Visit      Cardiovascular and Mediastinum   Hypertension  associated with diabetes (HCC) - Primary   Relevant Medications   hydrALAZINE (APRESOLINE) 25 MG tablet    use as needed for recovery until BP and pain reduces   Follow Up Instructions:  Follow up in 4 weeks for BP   I discussed the assessment and treatment plan with the patient. The patient was provided an opportunity to ask questions and all were answered.  The patient agreed with the plan and demonstrated an understanding of the instructions.   The patient was advised to call back or seek an in-person evaluation if the symptoms worsen or if the condition fails to improve as anticipated.  The above assessment and management plan was discussed with the patient. The patient verbalized understanding of and has agreed to the management plan. Patient is aware to call the clinic if symptoms persist or worsen. Patient is aware when to return to the clinic for a follow-up visit. Patient educated on when it is appropriate to go to the emergency department.    I provided 14 minutes of non-face-to-face time during this encounter.    Nils Pyle, MD

## 2019-04-17 DIAGNOSIS — E119 Type 2 diabetes mellitus without complications: Secondary | ICD-10-CM | POA: Diagnosis not present

## 2019-04-17 DIAGNOSIS — M5030 Other cervical disc degeneration, unspecified cervical region: Secondary | ICD-10-CM | POA: Diagnosis not present

## 2019-04-17 DIAGNOSIS — T84032D Mechanical loosening of internal right knee prosthetic joint, subsequent encounter: Secondary | ICD-10-CM | POA: Diagnosis not present

## 2019-04-17 DIAGNOSIS — I10 Essential (primary) hypertension: Secondary | ICD-10-CM | POA: Diagnosis not present

## 2019-04-17 DIAGNOSIS — Z7901 Long term (current) use of anticoagulants: Secondary | ICD-10-CM | POA: Diagnosis not present

## 2019-04-17 DIAGNOSIS — Z7984 Long term (current) use of oral hypoglycemic drugs: Secondary | ICD-10-CM | POA: Diagnosis not present

## 2019-04-17 DIAGNOSIS — G4730 Sleep apnea, unspecified: Secondary | ICD-10-CM | POA: Diagnosis not present

## 2019-04-17 DIAGNOSIS — Z6835 Body mass index (BMI) 35.0-35.9, adult: Secondary | ICD-10-CM | POA: Diagnosis not present

## 2019-04-20 DIAGNOSIS — E119 Type 2 diabetes mellitus without complications: Secondary | ICD-10-CM | POA: Diagnosis not present

## 2019-04-20 DIAGNOSIS — M5030 Other cervical disc degeneration, unspecified cervical region: Secondary | ICD-10-CM | POA: Diagnosis not present

## 2019-04-20 DIAGNOSIS — Z6835 Body mass index (BMI) 35.0-35.9, adult: Secondary | ICD-10-CM | POA: Diagnosis not present

## 2019-04-20 DIAGNOSIS — T84032D Mechanical loosening of internal right knee prosthetic joint, subsequent encounter: Secondary | ICD-10-CM | POA: Diagnosis not present

## 2019-04-20 DIAGNOSIS — G4730 Sleep apnea, unspecified: Secondary | ICD-10-CM | POA: Diagnosis not present

## 2019-04-20 DIAGNOSIS — I10 Essential (primary) hypertension: Secondary | ICD-10-CM | POA: Diagnosis not present

## 2019-04-20 DIAGNOSIS — Z7984 Long term (current) use of oral hypoglycemic drugs: Secondary | ICD-10-CM | POA: Diagnosis not present

## 2019-04-20 DIAGNOSIS — Z7901 Long term (current) use of anticoagulants: Secondary | ICD-10-CM | POA: Diagnosis not present

## 2019-04-21 ENCOUNTER — Other Ambulatory Visit: Payer: Self-pay | Admitting: Orthopaedic Surgery

## 2019-04-21 ENCOUNTER — Telehealth: Payer: Self-pay | Admitting: Orthopaedic Surgery

## 2019-04-21 MED ORDER — HYDROCODONE-ACETAMINOPHEN 5-325 MG PO TABS: 1.0000 | ORAL_TABLET | Freq: Four times a day (QID) | ORAL | 0 refills | Status: DC | PRN

## 2019-04-21 NOTE — Telephone Encounter (Signed)
Patient called. He would like a refill on hydrocodone. His call back number is [phone]

## 2019-04-21 NOTE — Telephone Encounter (Signed)
Please advise 

## 2019-04-22 ENCOUNTER — Telehealth: Payer: Self-pay

## 2019-04-22 DIAGNOSIS — Z7984 Long term (current) use of oral hypoglycemic drugs: Secondary | ICD-10-CM | POA: Diagnosis not present

## 2019-04-22 DIAGNOSIS — Z6835 Body mass index (BMI) 35.0-35.9, adult: Secondary | ICD-10-CM | POA: Diagnosis not present

## 2019-04-22 DIAGNOSIS — G4730 Sleep apnea, unspecified: Secondary | ICD-10-CM | POA: Diagnosis not present

## 2019-04-22 DIAGNOSIS — I10 Essential (primary) hypertension: Secondary | ICD-10-CM | POA: Diagnosis not present

## 2019-04-22 DIAGNOSIS — T84032D Mechanical loosening of internal right knee prosthetic joint, subsequent encounter: Secondary | ICD-10-CM | POA: Diagnosis not present

## 2019-04-22 DIAGNOSIS — Z7901 Long term (current) use of anticoagulants: Secondary | ICD-10-CM | POA: Diagnosis not present

## 2019-04-22 DIAGNOSIS — M5030 Other cervical disc degeneration, unspecified cervical region: Secondary | ICD-10-CM | POA: Diagnosis not present

## 2019-04-22 DIAGNOSIS — E119 Type 2 diabetes mellitus without complications: Secondary | ICD-10-CM | POA: Diagnosis not present

## 2019-04-22 MED ORDER — HYDROCODONE-ACETAMINOPHEN 7.5-325 MG PO TABS: 1.0000 | ORAL_TABLET | Freq: Four times a day (QID) | ORAL | 0 refills | Status: DC | PRN

## 2019-04-22 NOTE — Telephone Encounter (Signed)
Patient wants 7.5mg  Hydrocodone

## 2019-04-24 ENCOUNTER — Telehealth: Payer: Self-pay | Admitting: Orthopaedic Surgery

## 2019-04-24 NOTE — Telephone Encounter (Signed)
FYI- They are discharging him due to his behavior  He has an appt with [place] Monday

## 2019-04-24 NOTE — Telephone Encounter (Signed)
Alex [name] from [facility] called. Says patient was very irate, using lots of inappropriate language and words. Says she thinks he is abusing his pain meds. PT did not get done today. Her call back number is  [phone]

## 2019-04-27 ENCOUNTER — Other Ambulatory Visit: Payer: Self-pay

## 2019-04-27 ENCOUNTER — Ambulatory Visit (INDEPENDENT_AMBULATORY_CARE_PROVIDER_SITE_OTHER): Payer: Medicare HMO | Admitting: Orthopaedic Surgery

## 2019-04-27 ENCOUNTER — Encounter: Payer: Self-pay | Admitting: Orthopaedic Surgery

## 2019-04-27 DIAGNOSIS — M25561 Pain in right knee: Secondary | ICD-10-CM

## 2019-04-27 DIAGNOSIS — R6889 Other general symptoms and signs: Secondary | ICD-10-CM | POA: Diagnosis not present

## 2019-04-27 DIAGNOSIS — G8929 Other chronic pain: Secondary | ICD-10-CM

## 2019-04-27 DIAGNOSIS — Z96651 Presence of right artificial knee joint: Secondary | ICD-10-CM

## 2019-04-27 MED ORDER — HYDROCODONE-ACETAMINOPHEN 7.5-325 MG PO TABS: 1.0000 | ORAL_TABLET | Freq: Four times a day (QID) | ORAL | 0 refills | Status: DC | PRN

## 2019-04-27 NOTE — Progress Notes (Signed)
The patient is here at 2 weeks status post revision arthroplasty of a right total knee arthroplasty.  This originally was done elsewhere.  Did have some mechanical loosening and there was a large cystic change in the proximal tibia.  We are able to remove all the implants in place cemented longstem implants in the tibia and the femur.  He is someone who has some chronic pain issues and he is more continuously focused on his pain medications and his knee itself.  I counseled him about this.  We will continue his hydrocodone 7.5/325's but he understands we have to be careful about providing these.  He is interested in outpatient physical therapy.  He was released from home therapy due to being belligerent to the therapist.  Hopefully he can have outpatient therapy and will participate this at [facility] outpatient facility in [facility].  On exam his calf is soft on the right side.  His incision looks good trauma to the staples and place Steri-Strips.  We talked about his knee in general.  He is ambulate with a walker.  He lacks full extension by about 5 degrees and I can flex him to the right at 90 degrees.  It is essential to get him in outpatient physical therapy to get his knee bending and moving.  I counseled him about narcotics abuse.  We will see him back in 4 weeks to see how is doing overall but no x-rays are needed.

## 2019-04-28 DIAGNOSIS — I10 Essential (primary) hypertension: Secondary | ICD-10-CM | POA: Diagnosis not present

## 2019-04-28 DIAGNOSIS — E119 Type 2 diabetes mellitus without complications: Secondary | ICD-10-CM | POA: Diagnosis not present

## 2019-04-28 DIAGNOSIS — Z7901 Long term (current) use of anticoagulants: Secondary | ICD-10-CM | POA: Diagnosis not present

## 2019-04-28 DIAGNOSIS — Z7984 Long term (current) use of oral hypoglycemic drugs: Secondary | ICD-10-CM | POA: Diagnosis not present

## 2019-04-28 DIAGNOSIS — G4730 Sleep apnea, unspecified: Secondary | ICD-10-CM | POA: Diagnosis not present

## 2019-04-28 DIAGNOSIS — M5030 Other cervical disc degeneration, unspecified cervical region: Secondary | ICD-10-CM | POA: Diagnosis not present

## 2019-04-28 DIAGNOSIS — Z6835 Body mass index (BMI) 35.0-35.9, adult: Secondary | ICD-10-CM | POA: Diagnosis not present

## 2019-04-28 DIAGNOSIS — T84032D Mechanical loosening of internal right knee prosthetic joint, subsequent encounter: Secondary | ICD-10-CM | POA: Diagnosis not present

## 2019-05-07 ENCOUNTER — Telehealth: Payer: Self-pay | Admitting: Radiology

## 2019-05-07 MED ORDER — HYDROCODONE-ACETAMINOPHEN 7.5-325 MG PO TABS: 1.0000 | ORAL_TABLET | Freq: Four times a day (QID) | ORAL | 0 refills | Status: DC | PRN

## 2019-05-07 NOTE — Telephone Encounter (Signed)
Please advise 

## 2019-05-07 NOTE — Telephone Encounter (Signed)
Patient requests refill on hydrocodone 7.5/325 to [facility]. CB for [phone]

## 2019-05-12 ENCOUNTER — Other Ambulatory Visit: Payer: Self-pay | Admitting: Family Medicine

## 2019-05-12 DIAGNOSIS — L219 Seborrheic dermatitis, unspecified: Secondary | ICD-10-CM

## 2019-05-14 ENCOUNTER — Other Ambulatory Visit: Payer: Self-pay | Admitting: Orthopaedic Surgery

## 2019-05-14 ENCOUNTER — Telehealth: Payer: Self-pay | Admitting: Orthopaedic Surgery

## 2019-05-14 MED ORDER — HYDROCODONE-ACETAMINOPHEN 7.5-325 MG PO TABS: 1.0000 | ORAL_TABLET | Freq: Four times a day (QID) | ORAL | 0 refills | Status: DC | PRN

## 2019-05-14 NOTE — Telephone Encounter (Signed)
Patient aware of the below message  

## 2019-05-14 NOTE — Telephone Encounter (Signed)
Patient called on triage line.  He is wanting a refill of Hydrocodone 7.5-325 sent to [place].  He also wants to speak with someone about the pain in his leg. Call back [phone] Thanks!

## 2019-05-14 NOTE — Telephone Encounter (Signed)
I sent in some more hydrocodone for him.  I am not sure what else we can do for the pain in his leg.  Do call him to let him know we sent in pain medication for him.  He needs understand that given his revision surgery it will take certainly well for him not to have some pain.

## 2019-05-14 NOTE — Telephone Encounter (Signed)
Please advise 

## 2019-05-15 ENCOUNTER — Ambulatory Visit: Payer: Medicare HMO | Attending: Orthopaedic Surgery | Admitting: Physical Therapy

## 2019-05-15 ENCOUNTER — Encounter: Payer: Self-pay | Admitting: Physical Therapy

## 2019-05-15 ENCOUNTER — Other Ambulatory Visit: Payer: Self-pay

## 2019-05-15 DIAGNOSIS — R600 Localized edema: Secondary | ICD-10-CM

## 2019-05-15 DIAGNOSIS — M25661 Stiffness of right knee, not elsewhere classified: Secondary | ICD-10-CM | POA: Diagnosis not present

## 2019-05-15 DIAGNOSIS — M25561 Pain in right knee: Secondary | ICD-10-CM | POA: Diagnosis not present

## 2019-05-15 DIAGNOSIS — G8929 Other chronic pain: Secondary | ICD-10-CM

## 2019-05-15 NOTE — Therapy (Signed)
[facility] [place], [place], [address] Phone: [phone]   Fax:  [phone]  Physical Therapy Evaluation  Patient Details  Name: Alex [name] MRN: [id number] Date of Birth: [birth year] Referring Provider (PT): Jean Rosenthal MD   Encounter Date: 05/15/2019  PT End of Session - 05/15/19 1205    Visit Number  1    Number of Visits  12    Date for PT Re-Evaluation  06/26/19    Authorization Type  FOTO AT LEAST EVERY 5TH VISIT.  PROGRESS NOTE AT 10TH VISIT.    PT Start Time  0904    PT Stop Time  1002    PT Time Calculation (min)  58 min    Activity Tolerance  Patient tolerated treatment well    Behavior During Therapy  WFL for tasks assessed/performed       Past Medical History:  Diagnosis Date  . Diabetes mellitus without complication ([place])   . History of kidney stones    removed 5 months ago  . Hypertension   . PONV (postoperative nausea and vomiting)    AGITATION after anesthesia as well  . Sleep apnea    NO CPAP    Past Surgical History:  Procedure Laterality Date  . ANTERIOR CERVICAL DECOMP/DISCECTOMY FUSION N/A 03/27/2018   Procedure: ANTERIOR CERVICAL DECOMPRESSION/DISCECTOMY FUSION C3-5;  Surgeon: Melina Schools, MD;  Location: [facility];  Service: Orthopedics;  Laterality: N/A;  3.5 hrs  . arm surgery Right    x2 involving tendons/ulnar nerve  . BACK SURGERY    . CARPAL TUNNEL RELEASE Right   . clavical surgery    . EYE SURGERY Bilateral    glaucoma  . HERNIA REPAIR    . JOINT REPLACEMENT Right 06/2011  . TOTAL KNEE REVISION Right 04/10/2019   Procedure: RIGHT TOTAL KNEE REVISION;  Surgeon: Mcarthur Rossetti, MD;  Location: [facility];  Service: Orthopedics;  Laterality: Right;  . TRICEPS TENDON REPAIR      There were no vitals filed for this visit.   Subjective Assessment - 05/15/19 1224    Subjective  COVID-19 screen performed prior to patient entering clinic.  The patient underwent a  right total knee replacement on 04/10/19.  He had home health physical therapy and he is compliant to his HEP.  He continues to report a high pain-level.  He is ambulating with a straight cane on his right side.  Medication, ice and staying off knee decreases his pain and increased activity increases his pain.    Pertinent History  DM, HTN, triceps repair surgery, back surgery and CTS.    Patient Stated Goals  Get out of pain.    Currently in Pain?  Yes    Pain Score  8     Pain Location  Knee    Pain Orientation  Right    Pain Descriptors / Indicators  Aching    Pain Type  Surgical pain    Pain Onset  More than a month ago    Pain Frequency  Constant    Aggravating Factors   See above.    Pain Relieving Factors  See above.         [facility] PT Assessment - 05/15/19 0001      Assessment   Medical Diagnosis  Right total knee replacement    Referring Provider (PT)  Jean Rosenthal MD    Onset Date/Surgical Date  --   04/10/19 (surgery date).     Precautions   Precaution  Comments  --   No ultrasound.     Restrictions   Weight Bearing Restrictions  No      Balance Screen   Has the patient fallen in the past 6 months  No    Has the patient had a decrease in activity level because of a fear of falling?   No    Is the patient reluctant to leave their home because of a fear of falling?   No      Home Environment   Living Environment  Private residence      Prior Function   Level of Independence  Independent      Observation/Other Assessments   Observations  Right knee incisional site looks to be healing well with one steri-strip remaining.    Focus on Therapeutic Outcomes (FOTO)   67% limitation.      Observation/Other Assessments-Edema    Edema  Circumferential      Circumferential Edema   Circumferential - Right  RT 6 cms > LT.      ROM / Strength   AROM / PROM / Strength  AROM;Strength      AROM   Overall AROM Comments  Right knee active extension to -5 degrees and  full extension passively.  Active flexion= 100 degrees and 105 degrees passive.      Strength   Overall Strength Comments  Right hip and knee= 4+/5.      Palpation   Palpation comment  Tender to palpation diffusely around his right anterior knee.      Ambulation/Gait   Gait Comments  Mild gait antalgia with patient using a straight cane on his right side.                Objective measurements completed on examination: See above findings.      [facility] Adult PT Treatment/Exercise - 05/15/19 0001      Exercises   Exercises  Knee/Hip      Knee/Hip Exercises: Aerobic   Nustep  Level 3 x 10 minutes moving seat forward x 1 to increase knee flexion.      Modalities   Modalities  Estate agent Stimulation Location  Right knee.    Electrical Stimulation Action  IFC    Electrical Stimulation Parameters  80-150 Hz x 15 minutes.    Electrical Stimulation Goals  Edema;Pain      Vasopneumatic   Number Minutes Vasopneumatic   15 minutes    Vasopnuematic Location   --   Right knee.   Vasopneumatic Pressure  Low                  PT Long Term Goals - 05/15/19 1250      PT LONG TERM GOAL #1   Title  Independent with a HEP.    Time  4    Period  Weeks    Status  New      PT LONG TERM GOAL #2   Title  Active right knee flexion to 115-120 degrees+ so the patient can perform functional tasks and do so with pain not > 2-3/10.    Time  4    Period  Weeks    Status  New      PT LONG TERM GOAL #3   Title  Increase right knee strength to a solid 5/5 to provide good stability for accomplishment of functional activities.    Time  6  Period  Weeks    Status  New      PT LONG TERM GOAL #4   Title  Full active right  knee extension in order to normalize gait.    Time  6    Period  Weeks    Status  New      PT LONG TERM GOAL #5   Title  Perform a reciprocating stair gait with one railing with pain  not > 2-3/10.             Plan - 05/15/19 1230    Clinical Impression Statement  The patient presents to OPPT s/p right total knee replacement performed on 04/10/19.  He is pleased with his outcome thus far but continues to report a high pain-level.  He also has a moderate+ level of right knee edema currently.  He lack some flexion and extension but is doing quite well.  He walks safely with a straight cane.  Patient is expected to do well with skilled physical therapy intervention to address pain and deficits.    Personal Factors and Comorbidities  Comorbidity 1;Comorbidity 3+;Comorbidity 2    Comorbidities  DM, HTN, triceps repair surgery, back surgery and CTS.    Examination-Activity Limitations  Locomotion Level;Other    Examination-Participation Restrictions  Other    Stability/Clinical Decision Making  Stable/Uncomplicated    Clinical Decision Making  Low    Rehab Potential  Excellent    PT Frequency  3x / week    PT Duration  4 weeks    PT Treatment/Interventions  ADLs/Self Care Home Management;Cryotherapy;Electrical Stimulation;Gait training;Stair training;Functional mobility training;Therapeutic activities;Therapeutic exercise;Neuromuscular re-education;Manual techniques;Patient/family education;Passive range of motion;Vasopneumatic Device    PT Next Visit Plan  Nustep with progression to bike.  VMS to right quads to maximize extension.  TKA protocol progression.  Electrical stimulation and vasopneumatic.    Consulted and Agree with Plan of Care  Patient       Patient will benefit from skilled therapeutic intervention in order to improve the following deficits and impairments:  Abnormal gait, Decreased range of motion, Decreased activity tolerance, Decreased strength, Increased edema, Pain  Visit Diagnosis: Chronic pain of right knee - Plan: PT plan of care cert/re-cert  Stiffness of right knee, not elsewhere classified - Plan: PT plan of care cert/re-cert  Localized edema -  Plan: PT plan of care cert/re-cert     Problem List Patient Active Problem List   Diagnosis Date Noted  . Status post revision of total replacement of right knee 04/10/2019  . Loose right total knee arthroplasty (HCC) 03/09/2019  . Pain in right knee 02/17/2019  . Cervical spine degeneration 12/30/2018  . Right wrist pain 12/19/2018  . Right arm numbness 12/19/2018  . S/P cervical spinal fusion 03/27/2018  . Morbid obesity (HCC) 02/21/2018  . Diabetes mellitus type 2 in obese (HCC) 03/28/2017  . Hypertension associated with diabetes (HCC) 08/25/2016    APPLEGATE, [place] MPT 05/15/2019, 1:02 PM  [facility] [address] [place], [place], [address] Phone: [phone]   Fax:  [phone]  Name: Alex [name] MRN: [id number] Date of Birth: Oct 02, 1960

## 2019-05-21 ENCOUNTER — Telehealth: Payer: Self-pay | Admitting: Orthopaedic Surgery

## 2019-05-21 MED ORDER — METHOCARBAMOL 500 MG PO TABS: 500.0000 mg | ORAL_TABLET | Freq: Four times a day (QID) | ORAL | 1 refills | Status: DC | PRN

## 2019-05-21 MED ORDER — HYDROCODONE-ACETAMINOPHEN 7.5-325 MG PO TABS: 1.0000 | ORAL_TABLET | Freq: Four times a day (QID) | ORAL | 0 refills | Status: DC | PRN

## 2019-05-21 NOTE — Telephone Encounter (Signed)
Pt called in requesting a refill on hydrocodone, and methocarbamol Please have that sent to [place]

## 2019-05-21 NOTE — Telephone Encounter (Signed)
Please advise 

## 2019-05-25 ENCOUNTER — Encounter: Payer: Self-pay | Admitting: Orthopaedic Surgery

## 2019-05-25 ENCOUNTER — Ambulatory Visit (INDEPENDENT_AMBULATORY_CARE_PROVIDER_SITE_OTHER): Payer: Medicare HMO | Admitting: Orthopaedic Surgery

## 2019-05-25 DIAGNOSIS — Z96651 Presence of right artificial knee joint: Secondary | ICD-10-CM

## 2019-05-25 DIAGNOSIS — R6889 Other general symptoms and signs: Secondary | ICD-10-CM | POA: Diagnosis not present

## 2019-05-25 NOTE — Progress Notes (Signed)
The patient is now 6 to 7 weeks status post a revision arthroplasty failed right total knee that was done elsewhere.  He is walking with a rolling walker.  He is participating in physical therapy as well.  He has chronic pain and has been on hydrocodone for a while.  It is appropriate we continue this for least a month or 2 more.  On exam his extension is full and his flexion is to 95 degrees.  There is some swelling but not out of proportion of what we would see at this time postoperative.  At this point he will continue aggressive physical therapy.  We will see him back in 4 weeks to see what his progress is.  No x-rays are needed at that visit.

## 2019-05-27 ENCOUNTER — Telehealth: Payer: Self-pay | Admitting: Orthopaedic Surgery

## 2019-05-27 MED ORDER — HYDROCODONE-ACETAMINOPHEN 7.5-325 MG PO TABS: 1.0000 | ORAL_TABLET | Freq: Four times a day (QID) | ORAL | 0 refills | Status: DC | PRN

## 2019-05-27 NOTE — Telephone Encounter (Signed)
Patient called needing Rx refilled Hydrocodone    The number to contact patient is [phone]

## 2019-05-27 NOTE — Telephone Encounter (Signed)
Please advise 

## 2019-06-02 ENCOUNTER — Telehealth: Payer: Self-pay | Admitting: Orthopaedic Surgery

## 2019-06-02 MED ORDER — HYDROCODONE-ACETAMINOPHEN 7.5-325 MG PO TABS: 1.0000 | ORAL_TABLET | Freq: Four times a day (QID) | ORAL | 0 refills | Status: DC | PRN

## 2019-06-02 NOTE — Telephone Encounter (Signed)
Please advise 

## 2019-06-02 NOTE — Telephone Encounter (Signed)
Patient called requesting an RX refill on his Hydrocodone.  Patient uses [facility] in [place].  He stated that he will run out of the medication on Thanksgiving.  He is having to going out of town due to a family emergency and is leaving early in the morning.  CB#[phone].  Thank you.

## 2019-06-08 ENCOUNTER — Other Ambulatory Visit: Payer: Self-pay | Admitting: Family Medicine

## 2019-06-09 ENCOUNTER — Telehealth: Payer: Self-pay | Admitting: Orthopaedic Surgery

## 2019-06-09 MED ORDER — HYDROCODONE-ACETAMINOPHEN 7.5-325 MG PO TABS: 1.0000 | ORAL_TABLET | Freq: Four times a day (QID) | ORAL | 0 refills | Status: DC | PRN

## 2019-06-09 NOTE — Telephone Encounter (Signed)
I'll send some in. 

## 2019-06-09 NOTE — Telephone Encounter (Signed)
Patient called needing Rx refilled (Hydrocodone) Patient said he will be leaving town tomorrow for a funeral and will need to pick up rx before he leave. The number to contact patient is [phone]

## 2019-06-09 NOTE — Telephone Encounter (Signed)
Please advise 

## 2019-06-12 ENCOUNTER — Other Ambulatory Visit: Payer: Self-pay

## 2019-06-12 DIAGNOSIS — Z20822 Contact with and (suspected) exposure to covid-19: Secondary | ICD-10-CM

## 2019-06-15 ENCOUNTER — Telehealth: Payer: Self-pay | Admitting: *Deleted

## 2019-06-15 NOTE — Telephone Encounter (Signed)
Patient called for results ,still pending . 

## 2019-06-16 ENCOUNTER — Telehealth: Payer: Self-pay

## 2019-06-16 ENCOUNTER — Telehealth: Payer: Self-pay | Admitting: Orthopaedic Surgery

## 2019-06-16 LAB — NOVEL CORONAVIRUS, NAA: SARS-CoV-2, NAA: DETECTED — AB

## 2019-06-16 MED ORDER — HYDROCODONE-ACETAMINOPHEN 7.5-325 MG PO TABS: 1.0000 | ORAL_TABLET | Freq: Four times a day (QID) | ORAL | 0 refills | Status: DC | PRN

## 2019-06-16 NOTE — Telephone Encounter (Signed)
Pt notified of positive COVID-19 test results. Pt verbalized understanding. Pt wife reports that patient has fatigue and headaches.Pt advised to remain in self quarantine until at least 10 days since symptom onset And 3 consecutive days fever free without antipyretics And improvement in respiratory symptoms. Patient advised to utilize over the counter medications to treat symptoms. Pt advised to seek treatment in the ED if respiratory issues/distress develops.Pt advised they should only leave home to seek and medical care and must wear a mask in public. Pt instructed to limit contact with family members or caregivers in the home. Pt advised to practice social distancing and to continue to use good preventative care measures such has frequent hand washing, staying out of crowds and cleaning hard surfaces frequently touched in the home.Pt informed that the health department will likely follow up and may have additional recommendations. Will notify [facility] Department.

## 2019-06-16 NOTE — Telephone Encounter (Signed)
Tell him I sent some in, but he needs try try to start weaning and taking less because I will not prescribe any more after the first of the year.

## 2019-06-16 NOTE — Telephone Encounter (Signed)
Patient called requesting an RX refill on his hydrocodone.  Patient uses [facility]. CB#[phone].  Thank you.

## 2019-06-16 NOTE — Telephone Encounter (Signed)
Patient aware of the below message  

## 2019-06-16 NOTE — Telephone Encounter (Signed)
Please advise 

## 2019-06-17 ENCOUNTER — Telehealth: Payer: Self-pay | Admitting: Nurse Practitioner

## 2019-06-17 NOTE — Telephone Encounter (Signed)
Called to Discuss with patient about Covid symptoms and the use of bamlanivimab, a monoclonal antibody infusion for those with mild to moderate Covid symptoms and at a high risk of hospitalization.     Pt is qualified for this infusion at the [facility] infusion center due to co-morbid conditions and/or a member of an at-risk group.    Patient is being managed for the following:  Patient Active Problem List   Diagnosis Date Noted  . Status post revision of total replacement of right knee 04/10/2019  . Loose right total knee arthroplasty ([place]) 03/09/2019  . Pain in right knee 02/17/2019  . Cervical spine degeneration 12/30/2018  . Right wrist pain 12/19/2018  . Right arm numbness 12/19/2018  . S/P cervical spinal fusion 03/27/2018  . Morbid obesity ([place]) 02/21/2018  . Diabetes mellitus type 2 in obese ([place]) 03/28/2017  . Hypertension associated with diabetes ([place]) 08/25/2016      Unable to reach pt

## 2019-06-22 ENCOUNTER — Ambulatory Visit: Payer: Medicare HMO | Admitting: Orthopaedic Surgery

## 2019-06-23 ENCOUNTER — Telehealth: Payer: Self-pay | Admitting: Orthopaedic Surgery

## 2019-06-23 NOTE — Telephone Encounter (Signed)
Pt called in requesting a refill on Hydrocodone and please have that sent to [facility].  [phone]

## 2019-06-23 NOTE — Telephone Encounter (Signed)
Please advise 

## 2019-06-24 ENCOUNTER — Other Ambulatory Visit: Payer: Self-pay | Admitting: Family Medicine

## 2019-06-24 DIAGNOSIS — L219 Seborrheic dermatitis, unspecified: Secondary | ICD-10-CM

## 2019-06-24 MED ORDER — HYDROCODONE-ACETAMINOPHEN 7.5-325 MG PO TABS: 1.0000 | ORAL_TABLET | Freq: Three times a day (TID) | ORAL | 0 refills | Status: DC | PRN

## 2019-06-24 NOTE — Telephone Encounter (Signed)
Let him know that he needs to try to make these last because after January 2nd, he will be over 3 months post-op from his knee surgery.  I will only provide one more of these after this one and then we will have to go down to tramadol for a month and then off of all narcotics from me.

## 2019-06-30 ENCOUNTER — Telehealth: Payer: Self-pay | Admitting: Orthopaedic Surgery

## 2019-06-30 MED ORDER — HYDROCODONE-ACETAMINOPHEN 7.5-325 MG PO TABS: 1.0000 | ORAL_TABLET | Freq: Three times a day (TID) | ORAL | 0 refills | Status: DC | PRN

## 2019-06-30 NOTE — Telephone Encounter (Signed)
Patient called needing Rx refilled (Hydrocodone) Patient uses the [facility] in [place] number to contact patient is [phone]

## 2019-06-30 NOTE — Telephone Encounter (Signed)
Please advise 

## 2019-07-01 ENCOUNTER — Encounter: Payer: Self-pay | Admitting: Family Medicine

## 2019-07-01 ENCOUNTER — Ambulatory Visit: Payer: Self-pay | Admitting: Family Medicine

## 2019-07-01 ENCOUNTER — Telehealth: Payer: Self-pay | Admitting: Orthopaedic Surgery

## 2019-07-01 NOTE — Telephone Encounter (Signed)
Patient called to r/s his appointment from December 14, but is not sure if Dr. Ninfa Linden really needs to see him back or if he is good to go.  CB#[phone].  Thank you.

## 2019-07-01 NOTE — Telephone Encounter (Signed)
Yes, see below he would like to see him back in 3 months

## 2019-07-01 NOTE — Telephone Encounter (Signed)
It would be good to see him back in about 3 months to 6 months for repeat x-rays.

## 2019-07-01 NOTE — Telephone Encounter (Signed)
See below

## 2019-07-07 ENCOUNTER — Other Ambulatory Visit: Payer: Self-pay | Admitting: Orthopaedic Surgery

## 2019-07-07 ENCOUNTER — Other Ambulatory Visit: Payer: Self-pay

## 2019-07-07 ENCOUNTER — Telehealth: Payer: Self-pay | Admitting: Orthopaedic Surgery

## 2019-07-07 DIAGNOSIS — G8929 Other chronic pain: Secondary | ICD-10-CM

## 2019-07-07 DIAGNOSIS — Z96651 Presence of right artificial knee joint: Secondary | ICD-10-CM

## 2019-07-07 MED ORDER — HYDROCODONE-ACETAMINOPHEN 7.5-325 MG PO TABS: 1.0000 | ORAL_TABLET | Freq: Three times a day (TID) | ORAL | 0 refills | Status: DC | PRN

## 2019-07-07 NOTE — Telephone Encounter (Signed)
Error in documentation. Patient was not requesting a pain medication refill but was instead wanting to be referred to a pain management specialist.

## 2019-07-07 NOTE — Telephone Encounter (Signed)
Pt is requesting a referral put in for pain management, please give him a call    [phone]

## 2019-07-07 NOTE — Telephone Encounter (Signed)
Ok to refer him to pain management

## 2019-07-07 NOTE — Telephone Encounter (Signed)
Patient called back requesting a refill on his pain medication. Pt states that Dr Ninfa Linden told him that he would refill til the end of the year. Pt also checking the status of his referral to pain management.

## 2019-07-07 NOTE — Telephone Encounter (Signed)
Patient called. He is requesting a refill on his pain medication. He called earlier today but is calling again to push the matter.   Call back number: [phone]

## 2019-07-07 NOTE — Telephone Encounter (Signed)
Please advise 

## 2019-07-08 ENCOUNTER — Telehealth: Payer: Self-pay | Admitting: Orthopaedic Surgery

## 2019-07-08 NOTE — Telephone Encounter (Signed)
He doesn't need it refilled now, he's asking if you can refill it until he gets into pain mgmt because I let him know that sometimes that can take a couple months to get in

## 2019-07-08 NOTE — Telephone Encounter (Signed)
Patient asking if he can have Rx refilled until he gets in pain mgmt?>

## 2019-07-08 NOTE — Telephone Encounter (Signed)
I actually just refilled this so he does need to wait.

## 2019-07-08 NOTE — Telephone Encounter (Signed)
Patient called. Says he would like to be sent to a pain doctor. His call back number is [phone]

## 2019-07-13 ENCOUNTER — Ambulatory Visit: Payer: Medicare HMO | Admitting: Orthopaedic Surgery

## 2019-07-13 ENCOUNTER — Telehealth: Payer: Self-pay | Admitting: Orthopaedic Surgery

## 2019-07-13 MED ORDER — HYDROCODONE-ACETAMINOPHEN 7.5-325 MG PO TABS: 1.0000 | ORAL_TABLET | Freq: Two times a day (BID) | ORAL | 0 refills | Status: DC | PRN

## 2019-07-13 NOTE — Telephone Encounter (Signed)
Patient called and requesting refill on hydrocodone. Patient pharmacy is [facility] address [address] [place]. Patient is asking for call back. Patient phone number is [phone].

## 2019-07-13 NOTE — Telephone Encounter (Signed)
Please advise 

## 2019-07-20 ENCOUNTER — Telehealth: Payer: Self-pay | Admitting: Orthopaedic Surgery

## 2019-07-20 MED ORDER — HYDROCODONE-ACETAMINOPHEN 7.5-325 MG PO TABS: 1.0000 | ORAL_TABLET | Freq: Two times a day (BID) | ORAL | 0 refills | Status: DC | PRN

## 2019-07-20 NOTE — Telephone Encounter (Signed)
Regardless of whether or not he gets a pain management appointment, I cannot keep providing this medication over and over and over again.  I did send in again.  I cannot provide any more after this month.

## 2019-07-20 NOTE — Telephone Encounter (Signed)
Somehow that must of been typed 10 different.  Have him take the 20 pills and then we can always still refill them.  Regardless I am not going to provide them anymore after this month.

## 2019-07-20 NOTE — Telephone Encounter (Signed)
Patient called advised he only got a 20 tabs of Hydrocodone instead of a 40 tabs. Patient said he should have been told if the dosage was changed. Patient said he is not upset but he should have looked at the bottle before he picked it up. The number to contact patient is [phone]

## 2019-07-20 NOTE — Telephone Encounter (Signed)
Patient aware of the below message  

## 2019-07-20 NOTE — Telephone Encounter (Signed)
Pt called in requesting a refill on his medication Hydrocodone, please have that sent to [facility].  Pt said someone was suppose to reach out to him to schedule pain management but hasn't heard anything? Ask if he is suppose to reach out to a clinic or is he going to be referred.   [phone]

## 2019-07-20 NOTE — Telephone Encounter (Signed)
Please advise I told him it will take a bit to get an appt with pain mgmt

## 2019-07-22 NOTE — Telephone Encounter (Signed)
Called patient

## 2019-07-23 ENCOUNTER — Ambulatory Visit (INDEPENDENT_AMBULATORY_CARE_PROVIDER_SITE_OTHER): Payer: Medicare HMO | Admitting: Family Medicine

## 2019-07-23 ENCOUNTER — Encounter: Payer: Self-pay | Admitting: Family Medicine

## 2019-07-23 ENCOUNTER — Telehealth: Payer: Self-pay | Admitting: Family Medicine

## 2019-07-23 ENCOUNTER — Other Ambulatory Visit: Payer: Self-pay | Admitting: Family Medicine

## 2019-07-23 DIAGNOSIS — M25561 Pain in right knee: Secondary | ICD-10-CM | POA: Diagnosis not present

## 2019-07-23 DIAGNOSIS — E1169 Type 2 diabetes mellitus with other specified complication: Secondary | ICD-10-CM

## 2019-07-23 DIAGNOSIS — E1159 Type 2 diabetes mellitus with other circulatory complications: Secondary | ICD-10-CM | POA: Diagnosis not present

## 2019-07-23 DIAGNOSIS — E669 Obesity, unspecified: Secondary | ICD-10-CM

## 2019-07-23 DIAGNOSIS — G8929 Other chronic pain: Secondary | ICD-10-CM

## 2019-07-23 DIAGNOSIS — I10 Essential (primary) hypertension: Secondary | ICD-10-CM

## 2019-07-23 DIAGNOSIS — I152 Hypertension secondary to endocrine disorders: Secondary | ICD-10-CM

## 2019-07-23 DIAGNOSIS — M4722 Other spondylosis with radiculopathy, cervical region: Secondary | ICD-10-CM

## 2019-07-23 MED ORDER — DICLOFENAC SODIUM 1 % EX GEL: 2.0000 g | Freq: Four times a day (QID) | CUTANEOUS | 1 refills | Status: DC

## 2019-07-23 MED ORDER — ONDANSETRON HCL 4 MG PO TABS: 4.0000 mg | ORAL_TABLET | Freq: Three times a day (TID) | ORAL | 0 refills | Status: DC | PRN

## 2019-07-23 NOTE — Progress Notes (Signed)
Pt aware.

## 2019-07-23 NOTE — Progress Notes (Signed)
Virtual Visit via telephone Note  I connected with Alex [name] on 07/23/19 at 1247 by telephone and verified that I am speaking with the correct person using two identifiers. Alex [name] is currently located at home and no other people are currently with her during visit. The provider, Fransisca Kaufmann Kenyatte Chatmon, MD is located in their office at time of visit.  Call ended at 1259  I discussed the limitations, risks, security and privacy concerns of performing an evaluation and management service by telephone and the availability of in person appointments. I also discussed with the patient that there may be a patient responsible charge related to this service. The patient expressed understanding and agreed to proceed.   History and Present Illness: Type 2 diabetes mellitus Patient comes in today for recheck of his diabetes. Patient has been currently taking metformin, and blood sugar 102-100. Patient is currently on an ACE inhibitor/ARB. Patient has not seen an ophthalmologist this year. Patient denies any issues with their feet. Patient is on lyrica because of neuropathy and it is working.   Hypertension Patient is currently on lisinopril and hydralazine, and their blood pressure today is unknown. Patient denies any lightheadedness or dizziness. Patient denies headaches, blurred vision, chest pains, shortness of breath, or weakness. Denies any side effects from medication and is content with current medication.   Patient wants a referral for new pain management specialist. Outpatient Encounter Medications as of 07/23/2019  Medication Sig  . aspirin EC 325 MG EC tablet Take 1 tablet (325 mg total) by mouth 2 (two) times daily at 10 am and 4 pm.  . diclofenac (VOLTAREN) 75 MG EC tablet TAKE (1) TABLET TWICE A DAY.  Marland Kitchen diclofenac Sodium (VOLTAREN) 1 % GEL Apply 2 g topically 4 (four) times daily.  . hydrALAZINE (APRESOLINE) 25 MG tablet Take 1 tablet (25 mg total) by mouth 3 (three) times daily.  Marland Kitchen  HYDROcodone-acetaminophen (NORCO) 7.5-325 MG tablet Take 1 tablet by mouth 2 (two) times daily as needed for moderate pain.  Marland Kitchen ketoconazole (NIZORAL) 2 % shampoo APPLY TOPICALLY 2 TIMES A WEEK  . lisinopril (ZESTRIL) 40 MG tablet Take 1 tablet (40 mg total) by mouth daily.  . metFORMIN (GLUCOPHAGE) 500 MG tablet TAKE 2 TABLET BY MOUTH TWICE A DAY  . methocarbamol (ROBAXIN) 500 MG tablet Take 1 tablet (500 mg total) by mouth every 6 (six) hours as needed for muscle spasms.  . ondansetron (ZOFRAN) 4 MG tablet Take 1 tablet (4 mg total) by mouth every 8 (eight) hours as needed for nausea or vomiting.  Marland Kitchen OVER THE COUNTER MEDICATION Take 2 capsules by mouth daily. Testosterone support  . OVER THE COUNTER MEDICATION Take 2 capsules by mouth daily. Peak Male Testosterone support  . oxymetazoline (AFRIN) 0.05 % nasal spray Place 1 spray into both nostrils every 6 (six) hours as needed for congestion.   . pregabalin (LYRICA) 75 MG capsule Take 1 capsule (75 mg total) by mouth 2 (two) times daily as needed (for neuropathy pain).  Marland Kitchen tiZANidine (ZANAFLEX) 4 MG tablet Take 1 tablet (4 mg total) by mouth 2 (two) times daily as needed for muscle spasms.   No facility-administered encounter medications on file as of 07/23/2019.    Review of Systems  Constitutional: Negative for chills and fever.  Respiratory: Negative for shortness of breath and wheezing.   Cardiovascular: Negative for chest pain and leg swelling.  Musculoskeletal: Positive for arthralgias and back pain. Negative for gait problem.  Skin: Negative for rash.  Neurological: Negative for dizziness, weakness and light-headedness.  All other systems reviewed and are negative.   Observations/Objective: Patient sounds comfortable and in no acute distress  Assessment and Plan: Problem List Items Addressed This Visit      Cardiovascular and Mediastinum   Hypertension associated with diabetes (HCC)     Endocrine   Diabetes mellitus type 2 in  obese [facility]) - Primary     Musculoskeletal and Integument   Cervical spine degeneration   Relevant Orders   Ambulatory referral to Pain Clinic     Other   Pain in right knee   Relevant Orders   Ambulatory referral to Pain Clinic      Continue current bp and diabetes medication  Referral to new pain management for second opinion Follow up plan: Return in about 2 months (around 09/28/2019), or if symptoms worsen or fail to improve, for Diabetes recheck.     I discussed the assessment and treatment plan with the patient. The patient was provided an opportunity to ask questions and all were answered. The patient agreed with the plan and demonstrated an understanding of the instructions.   The patient was advised to call back or seek an in-person evaluation if the symptoms worsen or if the condition fails to improve as anticipated.  The above assessment and management plan was discussed with the patient. The patient verbalized understanding of and has agreed to the management plan. Patient is aware to call the clinic if symptoms persist or worsen. Patient is aware when to return to the clinic for a follow-up visit. Patient educated on when it is appropriate to go to the emergency department.    I provided 12 minutes of non-face-to-face time during this encounter.    Alex Pyle, MD

## 2019-07-23 NOTE — Progress Notes (Signed)
Sent in Zofran for the patient °

## 2019-07-27 ENCOUNTER — Telehealth: Payer: Self-pay | Admitting: Orthopaedic Surgery

## 2019-07-27 ENCOUNTER — Encounter: Payer: Self-pay | Admitting: Physical Medicine & Rehabilitation

## 2019-07-27 NOTE — Telephone Encounter (Signed)
Please advise 

## 2019-07-27 NOTE — Telephone Encounter (Signed)
Patient called.   He has an appointment February 1st with a pain management doctor but he is needing medicine in the mean time. He is wanting to speak with someone regarding a prescription   Call back number: [phone]

## 2019-07-27 NOTE — Telephone Encounter (Signed)
Hold for [place]. 

## 2019-07-28 ENCOUNTER — Telehealth: Payer: Self-pay | Admitting: Orthopaedic Surgery

## 2019-07-28 MED ORDER — HYDROCODONE-ACETAMINOPHEN 7.5-325 MG PO TABS: 1.0000 | ORAL_TABLET | Freq: Three times a day (TID) | ORAL | 0 refills | Status: DC | PRN

## 2019-07-28 NOTE — Telephone Encounter (Signed)
Would like refill Has appt with pain mgmt Feb 1

## 2019-07-28 NOTE — Telephone Encounter (Signed)
I sent in just one more prescription.  That's all I'll do.

## 2019-07-28 NOTE — Telephone Encounter (Signed)
Patient called.   He is wanting to speak with someone to someone regarding his prescription. He feels his dosage was cut too dramatically.   Call back number: [phone]

## 2019-07-29 ENCOUNTER — Telehealth: Payer: Self-pay | Admitting: Family Medicine

## 2019-07-29 NOTE — Telephone Encounter (Signed)
I have not done as much experience through inpatient detox programs but there are outpatient detox programs through [facility] in the [place], if he does not want to go there than there are many through [place] such as the [facility] and the [place] [facility], I do not know what insurance these companies will take but he would have to give them a call to see if they are covered.

## 2019-07-29 NOTE — Telephone Encounter (Signed)
Spoke with patient and states he will call back tomorrow.  Also asking if I could look up the number for him.  [name]- [phone] [facility]- [phone] [facility]- [phone] [facility]- [phone]

## 2019-07-29 NOTE — Telephone Encounter (Signed)
Patient is requesting in house rehab for help with his pain pills. He feels he would do better in a facility where he can stay. What do you recommend? His insurance will pay for it. Please advise

## 2019-07-29 NOTE — Telephone Encounter (Signed)
Pt is talking about doing an outpatient detox program to come off pain medicine for chronic pain and recent knee replacement.

## 2019-07-29 NOTE — Telephone Encounter (Signed)
Dr. Magnus Ivan refilled Rx last time. Patient scheduled with pain management.

## 2019-07-29 NOTE — Telephone Encounter (Signed)
I do not recall, as he tried to come off of his pain medicine or is he trying to get better recovery and rehab for his recent joint replacement? Alex Care, MD [facility] 07/29/2019, 3:16 PM

## 2019-07-31 DIAGNOSIS — F1120 Opioid dependence, uncomplicated: Secondary | ICD-10-CM | POA: Diagnosis not present

## 2019-07-31 DIAGNOSIS — R61 Generalized hyperhidrosis: Secondary | ICD-10-CM | POA: Diagnosis not present

## 2019-07-31 DIAGNOSIS — R110 Nausea: Secondary | ICD-10-CM | POA: Diagnosis not present

## 2019-08-05 ENCOUNTER — Telehealth: Payer: Self-pay | Admitting: Family Medicine

## 2019-08-05 MED ORDER — LIDOCAINE 5 % EX PTCH: 1.0000 | MEDICATED_PATCH | CUTANEOUS | 0 refills | Status: DC

## 2019-08-05 NOTE — Telephone Encounter (Signed)
Patient aware.

## 2019-08-05 NOTE — Telephone Encounter (Signed)
Please let the patient know that I sent to the pharmacy, this medicine is sometimes not covered so just expect that might be a possibility but if it is covered then that is great. Arville Care, MD [facility] 08/05/2019, 3:31 PM

## 2019-08-10 ENCOUNTER — Other Ambulatory Visit: Payer: Self-pay

## 2019-08-10 ENCOUNTER — Encounter: Payer: Self-pay | Admitting: Physical Medicine & Rehabilitation

## 2019-08-10 ENCOUNTER — Encounter: Payer: Medicare HMO | Attending: Physical Medicine & Rehabilitation | Admitting: Physical Medicine & Rehabilitation

## 2019-08-10 VITALS — BP 170/103 | HR 66 | Temp 97.2°F | Ht 66.0 in | Wt 220.0 lb

## 2019-08-10 DIAGNOSIS — R6889 Other general symptoms and signs: Secondary | ICD-10-CM | POA: Diagnosis not present

## 2019-08-10 DIAGNOSIS — M542 Cervicalgia: Secondary | ICD-10-CM | POA: Diagnosis not present

## 2019-08-10 DIAGNOSIS — Z981 Arthrodesis status: Secondary | ICD-10-CM

## 2019-08-10 DIAGNOSIS — M25561 Pain in right knee: Secondary | ICD-10-CM | POA: Insufficient documentation

## 2019-08-10 DIAGNOSIS — Z96651 Presence of right artificial knee joint: Secondary | ICD-10-CM | POA: Insufficient documentation

## 2019-08-10 DIAGNOSIS — G8929 Other chronic pain: Secondary | ICD-10-CM | POA: Diagnosis not present

## 2019-08-10 DIAGNOSIS — R269 Unspecified abnormalities of gait and mobility: Secondary | ICD-10-CM | POA: Insufficient documentation

## 2019-08-10 MED ORDER — METHOCARBAMOL 500 MG PO TABS: 500.0000 mg | ORAL_TABLET | Freq: Three times a day (TID) | ORAL | 1 refills | Status: DC | PRN

## 2019-08-10 NOTE — Telephone Encounter (Signed)
TTC pt to discuss PA for Lidocaine patches as it looks like the patches were d/c'd today but pt didn't answer and couldn't leave VM.

## 2019-08-10 NOTE — Progress Notes (Signed)
Subjective:    Patient ID: Alex [name], male    DOB: 05/19/1961, 59 y.o.   MRN: [id number]  HPI Male with pmh/psh of OSA, HTN, DM, right TKA in 2015 and then revision 04/2019, right CTS, back surgery, ACDF 2019 presents with right knee pain. Notes reviewed from Ortho and PCP with patient requesting pain medications and also for referral for Detox program. Knee pain has improved since 2015.  He had HH PT with benefit. Ice along with hydrocodone improves the pain.  Morning pain is worse.  Radiates to foot. Denies associated numbness.  Denies falls. Pain is proximal and distal to knee.      Pain Inventory Average Pain 7 Pain Right Now 8 My pain is constant  In the last 24 hours, has pain interfered with the following? General activity 3 Relation with others 6 Enjoyment of life 5 What TIME of day is your pain at its worst? morning, evening  Sleep (in general) Poor  Pain is worse with: walking, bending and sitting Pain improves with: heat/ice and other Relief from Meds: 5  Mobility walk without assistance walk with assistance use a cane ability to climb steps?  yes do you drive?  yes  Function disabled: date disabled .  Neuro/Psych weakness spasms  Prior Studies new  Physicians involved in your care new   Family History  Problem Relation Age of Onset  . Cancer Mother   . Diabetes Father   . Hypertension Father   . Heart attack Father    Social History   Socioeconomic History  . Marital status: Married    Spouse name: Not on file  . Number of children: Not on file  . Years of education: Not on file  . Highest education level: Not on file  Occupational History  . Not on file  Tobacco Use  . Smoking status: Never Smoker  . Smokeless tobacco: Current User    Types: Chew  Substance and Sexual Activity  . Alcohol use: No  . Drug use: No  . Sexual activity: Not on file  Other Topics Concern  . Not on file  Social History Narrative  . Not on file    Social Determinants of Health   Financial Resource Strain:   . Difficulty of Paying Living Expenses: Not on file  Food Insecurity:   . Worried About Charity fundraiser in the Last Year: Not on file  . Ran Out of Food in the Last Year: Not on file  Transportation Needs:   . Lack of Transportation (Medical): Not on file  . Lack of Transportation (Non-Medical): Not on file  Physical Activity:   . Days of Exercise per Week: Not on file  . Minutes of Exercise per Session: Not on file  Stress:   . Feeling of Stress : Not on file  Social Connections:   . Frequency of Communication with Friends and Family: Not on file  . Frequency of Social Gatherings with Friends and Family: Not on file  . Attends Religious Services: Not on file  . Active Member of Clubs or Organizations: Not on file  . Attends Archivist Meetings: Not on file  . Marital Status: Not on file   Past Surgical History:  Procedure Laterality Date  . ANTERIOR CERVICAL DECOMP/DISCECTOMY FUSION N/A 03/27/2018   Procedure: ANTERIOR CERVICAL DECOMPRESSION/DISCECTOMY FUSION C3-5;  Surgeon: Melina Schools, MD;  Location: [place];  Service: Orthopedics;  Laterality: N/A;  3.5 hrs  . arm surgery Right  x2 involving tendons/ulnar nerve  . BACK SURGERY    . CARPAL TUNNEL RELEASE Right   . clavical surgery    . EYE SURGERY Bilateral    glaucoma  . HERNIA REPAIR    . JOINT REPLACEMENT Right 06/2011  . TOTAL KNEE REVISION Right 04/10/2019   Procedure: RIGHT TOTAL KNEE REVISION;  Surgeon: Kathryne Hitch, MD;  Location: [facility];  Service: Orthopedics;  Laterality: Right;  . TRICEPS TENDON REPAIR     Past Medical History:  Diagnosis Date  . Diabetes mellitus without complication (HCC)   . History of kidney stones    removed 5 months ago  . Hypertension   . PONV (postoperative nausea and vomiting)    AGITATION after anesthesia as well  . Sleep apnea    NO CPAP   BP (!) 170/103   Pulse 66   Temp (!) 97.2 F  (36.2 C)   Ht 5\' 6"  (1.676 m)   Wt 220 lb (99.8 kg)   SpO2 97%   BMI 35.51 kg/m   Opioid Risk Score:   Fall Risk Score:  `1  Depression screen PHQ 2/9  Depression screen [facility] 2/9 08/10/2019 12/30/2018 11/18/2018 09/26/2018 05/28/2018 03/31/2018 02/21/2018  Decreased Interest 1 0 0 0 0 0 0  Down, Depressed, Hopeless 2 0 0 0 0 0 0  PHQ - 2 Score 3 0 0 0 0 0 0  Altered sleeping 3 - - - - - -  Tired, decreased energy 2 - - - - - -  Change in appetite 0 - - - - - -  Feeling bad or failure about yourself  2 - - - - - -  Trouble concentrating 3 - - - - - -  Moving slowly or fidgety/restless 1 - - - - - -  Suicidal thoughts 0 - - - - - -  PHQ-9 Score 14 - - - - - -    Review of Systems  Constitutional: Negative.  Negative for fever.  HENT: Negative.   Eyes: Negative.   Respiratory: Negative.   Cardiovascular: Negative.   Gastrointestinal: Negative.   Endocrine: Negative.   Genitourinary: Negative.   Musculoskeletal: Positive for arthralgias, gait problem and neck pain.       Spasms   Skin: Negative.   Allergic/Immunologic: Negative.   Neurological: Positive for weakness.  Hematological: Negative.   Psychiatric/Behavioral: Negative.   All other systems reviewed and are negative.      Objective:   Physical Exam Constitutional: No distress . Vital signs reviewed. Obese.  HENT: Normocephalic.  Atraumatic. Eyes: EOMI. No discharge. Cardiovascular: No JVD. Respiratory: Normal effort.  No stridor. GI: Non-distended. Skin: Warm and dry.  Intact. Psych: Normal mood.  Normal behavior. Musc: Right knee TTP, greatest along medial/lateral aspect Neuro: Alert Motor: RLE: HF 4+/5, KE 4/5 (pain inhibition), ADF 5/5 Sensation intact to light touch    Assessment & Plan:  Male with pmh/psh of OSA, HTN, DM, right TKA in 2015 and then revision 04/2019, right CTS, back surgery, ACDF 2019 presents with right knee pain.  1. Chronic right knee pain s/p TKA 2015 and revision 2020  Xray 2020  showing post op changes  Side effects with Gabapentin  Labs reviewed  Referral information reviewed  PMAWARE reviewed  Cont Heat/Cold  Will refer to aquatic therapy  Will trial TENS unit  Encouraged daily ROM/stretching  Will consider Bracing  Cont Voltaren PO, encouraged to avoid concominant use of Voltaren gel 4/day  Will consider Lidoderm  Will consider Cymbalta  Will increase Robaxin to 500 TID  Will consider Accupuncture  He states he has things to detox (?Clonidine), which he takes when he needs to and is self-monitoring, but is also requesting Narcotics   2. Gait abnormality  Encouraged Cane for safety  3. Sleep disturbance  ?Fair at present  4. Morbid Obesity  Will refer to dietitian  5. Neck pain  Patient to follow up with Neurosurg

## 2019-08-14 ENCOUNTER — Telehealth: Payer: Self-pay | Admitting: Physical Medicine & Rehabilitation

## 2019-08-14 NOTE — Telephone Encounter (Signed)
We can prescribe him a medrol dose pack if he would like, but as mentioned, I do not inject steroids into a prosthesis (I don't know anyone that does), so he should discuss with his surgeon if that is what he is looking for. I also do not do cervical ESIs.  We can do trigger point injections in his neck if that is something he is interested in.

## 2019-08-14 NOTE — Telephone Encounter (Signed)
Patient Request steroid injection in Knee and Neck, states he is in a lot of pain and needs relief before his next appointment.

## 2019-08-14 NOTE — Telephone Encounter (Signed)
Notified patient and he states he is looking for a doctor to help relieve his knee and neck pain with steroid.

## 2019-08-14 NOTE — Telephone Encounter (Signed)
As I discussed with him, I do no inject into prosthetic knees.  He can follow up with his surgeon.  Also, he states his neck pain was reoccurrence from his previous surgery and was going to follow up with his Neurosurgeon.  I would recommend reimaging before any injections, but will let his surgeon decide.  Thanks.

## 2019-08-18 NOTE — Telephone Encounter (Signed)
Tried calling back and no answer/no voicemail set up.

## 2019-08-26 ENCOUNTER — Other Ambulatory Visit: Payer: Self-pay | Admitting: Family Medicine

## 2019-08-26 DIAGNOSIS — L219 Seborrheic dermatitis, unspecified: Secondary | ICD-10-CM

## 2019-09-07 ENCOUNTER — Encounter: Payer: Medicare HMO | Attending: Physical Medicine & Rehabilitation | Admitting: Physical Medicine & Rehabilitation

## 2019-09-07 DIAGNOSIS — Z96651 Presence of right artificial knee joint: Secondary | ICD-10-CM | POA: Insufficient documentation

## 2019-09-07 DIAGNOSIS — G8929 Other chronic pain: Secondary | ICD-10-CM | POA: Insufficient documentation

## 2019-09-07 DIAGNOSIS — Z981 Arthrodesis status: Secondary | ICD-10-CM | POA: Insufficient documentation

## 2019-09-07 DIAGNOSIS — R269 Unspecified abnormalities of gait and mobility: Secondary | ICD-10-CM | POA: Insufficient documentation

## 2019-09-07 DIAGNOSIS — M542 Cervicalgia: Secondary | ICD-10-CM | POA: Insufficient documentation

## 2019-09-07 DIAGNOSIS — M25561 Pain in right knee: Secondary | ICD-10-CM | POA: Insufficient documentation

## 2019-09-09 ENCOUNTER — Ambulatory Visit (INDEPENDENT_AMBULATORY_CARE_PROVIDER_SITE_OTHER): Payer: Medicare HMO

## 2019-09-09 ENCOUNTER — Ambulatory Visit (INDEPENDENT_AMBULATORY_CARE_PROVIDER_SITE_OTHER): Payer: Medicare HMO | Admitting: Orthopaedic Surgery

## 2019-09-09 ENCOUNTER — Telehealth: Payer: Self-pay | Admitting: Orthopaedic Surgery

## 2019-09-09 ENCOUNTER — Other Ambulatory Visit: Payer: Self-pay

## 2019-09-09 ENCOUNTER — Other Ambulatory Visit: Payer: Self-pay | Admitting: Family Medicine

## 2019-09-09 ENCOUNTER — Encounter: Payer: Self-pay | Admitting: Orthopaedic Surgery

## 2019-09-09 DIAGNOSIS — Z96651 Presence of right artificial knee joint: Secondary | ICD-10-CM

## 2019-09-09 DIAGNOSIS — R6889 Other general symptoms and signs: Secondary | ICD-10-CM | POA: Diagnosis not present

## 2019-09-09 MED ORDER — DICLOFENAC SODIUM 75 MG PO TBEC: 75.0000 mg | DELAYED_RELEASE_TABLET | Freq: Two times a day (BID) | ORAL | 3 refills | Status: DC | PRN

## 2019-09-09 NOTE — Progress Notes (Signed)
The patient is a 59 year old gentleman who is now 5 months status post revision arthroplasty of a right total knee.  The original total knee was done in [place].  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  We performed a revision of the both the femoral and tibial components with long stems.  He is now in chronic pain management but not happy with them.  He says the knee is doing well though.  On exam his extension is full and his flexion is to past 100 degrees.  The knee feels loose is stable with the right knee exam.  There is no significant swelling.  There is no redness.  His calf is soft.  X-rays confirm a well-seated knee arthroplasty on the right side with revision components.  There is some slight lucency medially but this is a long stem with good cement mantle throughout the metaphyseal and diaphyseal standpoint.  At this point he will continue to increase his activities as comfort allows.  We will see him back in 7 months with a final AP and lateral of the right knee at his 1 year follow-up appointment.

## 2019-09-09 NOTE — Telephone Encounter (Signed)
I sent some in 

## 2019-09-09 NOTE — Telephone Encounter (Signed)
Patient called asked if he can get a Rx for (Diclofenac- pill)? The number to contact patient is [phone]

## 2019-10-13 ENCOUNTER — Telehealth: Payer: Self-pay | Admitting: Family Medicine

## 2019-10-13 DIAGNOSIS — E1142 Type 2 diabetes mellitus with diabetic polyneuropathy: Secondary | ICD-10-CM

## 2019-10-13 DIAGNOSIS — E1159 Type 2 diabetes mellitus with other circulatory complications: Secondary | ICD-10-CM

## 2019-10-13 DIAGNOSIS — I152 Hypertension secondary to endocrine disorders: Secondary | ICD-10-CM

## 2019-10-13 DIAGNOSIS — E1169 Type 2 diabetes mellitus with other specified complication: Secondary | ICD-10-CM

## 2019-10-13 MED ORDER — LISINOPRIL 40 MG PO TABS: 40.0000 mg | ORAL_TABLET | Freq: Every day | ORAL | 0 refills | Status: DC

## 2019-10-13 MED ORDER — METFORMIN HCL 500 MG PO TABS: ORAL_TABLET | ORAL | 0 refills | Status: DC

## 2019-10-13 NOTE — Telephone Encounter (Signed)
Meds sent

## 2019-10-13 NOTE — Telephone Encounter (Signed)
  Prescription Request  10/13/2019  What is the name of the medication or equipment? Metformin and Lisinopril  Have you contacted your pharmacy to request a refill? (if applicable) Yes  Which pharmacy would you like this sent to? [facility] he is almost out and needs refill on both Rx's.   Patient notified that their request is being sent to the clinical staff for review and that they should receive a response within 2 business days.

## 2019-10-14 ENCOUNTER — Ambulatory Visit: Payer: Medicare HMO | Admitting: Nutrition

## 2019-10-26 ENCOUNTER — Other Ambulatory Visit: Payer: Self-pay | Admitting: Family Medicine

## 2019-10-26 DIAGNOSIS — L219 Seborrheic dermatitis, unspecified: Secondary | ICD-10-CM

## 2019-11-19 ENCOUNTER — Ambulatory Visit: Payer: Medicare HMO | Admitting: Family Medicine

## 2019-11-26 ENCOUNTER — Other Ambulatory Visit: Payer: Self-pay

## 2019-11-26 ENCOUNTER — Encounter: Payer: Self-pay | Admitting: Family Medicine

## 2019-11-26 ENCOUNTER — Ambulatory Visit (INDEPENDENT_AMBULATORY_CARE_PROVIDER_SITE_OTHER): Payer: Medicare HMO | Admitting: Family Medicine

## 2019-11-26 VITALS — BP 125/72 | HR 87 | Temp 98.3°F | Ht 66.0 in | Wt 234.5 lb

## 2019-11-26 DIAGNOSIS — L219 Seborrheic dermatitis, unspecified: Secondary | ICD-10-CM

## 2019-11-26 DIAGNOSIS — E669 Obesity, unspecified: Secondary | ICD-10-CM | POA: Diagnosis not present

## 2019-11-26 DIAGNOSIS — E1142 Type 2 diabetes mellitus with diabetic polyneuropathy: Secondary | ICD-10-CM | POA: Diagnosis not present

## 2019-11-26 DIAGNOSIS — I10 Essential (primary) hypertension: Secondary | ICD-10-CM

## 2019-11-26 DIAGNOSIS — I83811 Varicose veins of right lower extremities with pain: Secondary | ICD-10-CM

## 2019-11-26 DIAGNOSIS — R6889 Other general symptoms and signs: Secondary | ICD-10-CM | POA: Diagnosis not present

## 2019-11-26 DIAGNOSIS — I152 Hypertension secondary to endocrine disorders: Secondary | ICD-10-CM

## 2019-11-26 DIAGNOSIS — E1159 Type 2 diabetes mellitus with other circulatory complications: Secondary | ICD-10-CM | POA: Diagnosis not present

## 2019-11-26 DIAGNOSIS — E1169 Type 2 diabetes mellitus with other specified complication: Secondary | ICD-10-CM

## 2019-11-26 LAB — BAYER DCA HB A1C WAIVED: HB A1C (BAYER DCA - WAIVED): 6.6 % (ref <7.0)

## 2019-11-26 MED ORDER — LISINOPRIL 40 MG PO TABS: 40.0000 mg | ORAL_TABLET | Freq: Every day | ORAL | 3 refills | Status: DC

## 2019-11-26 MED ORDER — METFORMIN HCL 500 MG PO TABS: 1000.0000 mg | ORAL_TABLET | Freq: Two times a day (BID) | ORAL | 3 refills | Status: DC

## 2019-11-26 NOTE — Progress Notes (Signed)
BP 125/72   Pulse 87   Temp 98.3 F (36.8 C) (Temporal)   Ht _0  (1.676 m)   Wt 234 lb 8 oz (106.4 kg)   BMI 37.85 kg/m    Subjective:   Patient ID: Alex [name], male    DOB: [birth year], 59 y.o.   MRN: [id number]  HPI: Alex [name] is a 59 y.o. male presenting on 11/26/2019 for Medical Management of Chronic Issues   HPI Type 2 diabetes mellitus Patient comes in today for recheck of his diabetes. Patient has been currently taking Metformin, A1c is gone up and is now 6.6, he just had a knee replacement and has been more sedentary, he will try to be more active. Patient is currently on an ACE inhibitor/ARB. Patient has seen an ophthalmologist this year. Patient denies any issues with their feet. The symptom started onset as an adult hypertension ARE RELATED TO DM   Hypertension Patient is currently on lisinopril, and their blood pressure today is 125/72. Patient denies any lightheadedness or dizziness. Patient denies headaches, blurred vision, chest pains, shortness of breath, or weakness. Denies any side effects from medication and is content with current medication.   Patient comes in complaining of painful varicose veins, specifically 1 on his right inner calf that has been bothering him is very irritated and painful and pruritic  Relevant past medical, surgical, family and social history reviewed and updated as indicated. Interim medical history since our last visit reviewed. Allergies and medications reviewed and updated.  Review of Systems  Constitutional: Negative for chills and fever.  Respiratory: Negative for shortness of breath and wheezing.   Cardiovascular: Negative for chest pain and leg swelling.  Musculoskeletal: Negative for back pain and gait problem.  Skin: Negative for rash.  Neurological: Negative for dizziness, weakness and light-headedness.  All other systems reviewed and are negative.   Per HPI unless specifically indicated above   Allergies as of  11/26/2019      Reactions   Morphine And Related Nausea Only   Buprenorphine Hcl-naloxone Hcl Nausea Only   Codeine Nausea Only, Other (See Comments)   per pt, "felt weird in my head"      Medication List       Accurate as of Nov 26, 2019  9:48 AM. If you have any questions, ask your nurse or doctor.        STOP taking these medications   HYDROcodone-acetaminophen 7.5-325 MG tablet Commonly known as: NORCO Stopped by: Fransisca Kaufmann Shateria Paternostro, MD   ondansetron 4 MG tablet Commonly known as: Zofran Stopped by: Worthy Rancher, MD   OVER THE COUNTER MEDICATION Stopped by: Fransisca Kaufmann Shankar Silber, MD   OVER THE COUNTER MEDICATION Stopped by: Fransisca Kaufmann Mutasim Tuckey, MD     TAKE these medications   diclofenac 75 MG EC tablet Commonly known as: VOLTAREN Take 1 tablet (75 mg total) by mouth 2 (two) times daily as needed.   diclofenac Sodium 1 % Gel Commonly known as: Voltaren Apply 2 g topically 4 (four) times daily.   ketoconazole 2 % shampoo Commonly known as: NIZORAL APPLY TOPICALLY 2 TIMES A WEEK   lisinopril 40 MG tablet Commonly known as: ZESTRIL Take 1 tablet (40 mg total) by mouth daily.   metFORMIN 500 MG tablet Commonly known as: GLUCOPHAGE Take 2 tablets (1,000 mg total) by mouth 2 (two) times daily with a meal. TAKE 2 TABLET BY MOUTH TWICE A DAY What changed:   how much to take  how to  take this  when to take this Changed by: Worthy Rancher, MD   methocarbamol 500 MG tablet Commonly known as: Robaxin Take 1 tablet (500 mg total) by mouth every 8 (eight) hours as needed for muscle spasms.   oxymetazoline 0.05 % nasal spray Commonly known as: AFRIN Place 1 spray into both nostrils every 6 (six) hours as needed for congestion.   pregabalin 75 MG capsule Commonly known as: LYRICA Take 1 capsule (75 mg total) by mouth 2 (two) times daily as needed (for neuropathy pain).        Objective:   BP 125/72   Pulse 87   Temp 98.3 F (36.8 C) (Temporal)    Ht _0  (1.676 m)   Wt 234 lb 8 oz (106.4 kg)   BMI 37.85 kg/m   Wt Readings from Last 3 Encounters:  11/26/19 234 lb 8 oz (106.4 kg)  08/10/19 220 lb (99.8 kg)  04/10/19 220 lb (99.8 kg)    Physical Exam Vitals and nursing note reviewed.  Constitutional:      General: He is not in acute distress.    Appearance: He is well-developed. He is not diaphoretic.  Eyes:     General: No scleral icterus.    Conjunctiva/sclera: Conjunctivae normal.  Neck:     Thyroid: No thyromegaly.  Cardiovascular:     Rate and Rhythm: Normal rate and regular rhythm.     Heart sounds: Normal heart sounds. No murmur.  Pulmonary:     Effort: Pulmonary effort is normal. No respiratory distress.     Breath sounds: Normal breath sounds. No wheezing.  Musculoskeletal:        General: Normal range of motion.     Cervical back: Neck supple.  Lymphadenopathy:     Cervical: No cervical adenopathy.  Skin:    General: Skin is warm and dry.     Findings: No rash.  Neurological:     Mental Status: He is alert and oriented to person, place, and time.     Coordination: Coordination normal.  Psychiatric:        Behavior: Behavior normal.     Diabetic Foot Exam - Simple   Simple Foot Form Diabetic Foot exam was performed with the following findings: Yes 11/26/2019  9:46 AM  Visual Inspection No deformities, no ulcerations, no other skin breakdown bilaterally: Yes Sensation Testing Intact to touch and monofilament testing bilaterally: Yes Pulse Check Posterior Tibialis and Dorsalis pulse intact bilaterally: Yes Comments      Assessment & Plan:   Problem List Items Addressed This Visit      Cardiovascular and Mediastinum   Hypertension associated with diabetes ([place])   Relevant Medications   metFORMIN (GLUCOPHAGE) 500 MG tablet   lisinopril (ZESTRIL) 40 MG tablet   Other Relevant Orders   CMP14+EGFR     Endocrine   Diabetes mellitus type 2 in obese (HCC)   Relevant Medications   metFORMIN  (GLUCOPHAGE) 500 MG tablet   lisinopril (ZESTRIL) 40 MG tablet   Other Relevant Orders   Lipid panel     Other   Morbid obesity ([place])   Relevant Medications   metFORMIN (GLUCOPHAGE) 500 MG tablet   Other Relevant Orders   Lipid panel    Other Visit Diagnoses    Type 2 diabetes mellitus with diabetic polyneuropathy, without long-term current use of insulin (HCC)    -  Primary   Relevant Medications   metFORMIN (GLUCOPHAGE) 500 MG tablet   lisinopril (ZESTRIL) 40 MG  tablet   Other Relevant Orders   Bayer DCA Hb A1c Waived   Microalbumin / creatinine urine ratio   CBC with Differential/Platelet   CMP14+EGFR   Varicose veins of right lower extremity with pain       Relevant Medications   lisinopril (ZESTRIL) 40 MG tablet   Other Relevant Orders   Ambulatory referral to Vascular Surgery   Seborrheic dermatitis of scalp       Relevant Orders   Ambulatory referral to Dermatology      Continue current medication, A1c 6.6, he will focus on losing weight and diet and lifestyle being active again.  With his knee replacement that did affect him, he will get back to it, recommended swimming. Follow up plan: Return in about 3 months (around 02/26/2020), or if symptoms worsen or fail to improve, for Diabetes and hypertension.  Counseling provided for all of the vaccine components Orders Placed This Encounter  Procedures  . Bayer DCA Hb A1c Waived  . Microalbumin / creatinine urine ratio  . CBC with Differential/Platelet  . CMP14+EGFR  . Lipid panel  . Ambulatory referral to Dermatology  . Ambulatory referral to Vascular Surgery    Caryl Pina, MD [facility] 11/26/2019, 9:48 AM

## 2019-11-27 ENCOUNTER — Telehealth: Payer: Self-pay | Admitting: Family Medicine

## 2019-11-27 DIAGNOSIS — Z96651 Presence of right artificial knee joint: Secondary | ICD-10-CM

## 2019-11-27 LAB — CMP14+EGFR
ALT: 43 IU/L (ref 0–44)
AST: 25 IU/L (ref 0–40)
Albumin/Globulin Ratio: 2.8 — ABNORMAL HIGH (ref 1.2–2.2)
Albumin: 4.7 g/dL (ref 3.8–4.9)
Alkaline Phosphatase: 83 IU/L (ref 48–121)
BUN/Creatinine Ratio: 13 (ref 9–20)
BUN: 12 mg/dL (ref 6–24)
Bilirubin Total: 0.4 mg/dL (ref 0.0–1.2)
CO2: 22 mmol/L (ref 20–29)
Calcium: 9.5 mg/dL (ref 8.7–10.2)
Chloride: 106 mmol/L (ref 96–106)
Creatinine, Ser: 0.94 mg/dL (ref 0.76–1.27)
GFR calc Af Amer: 103 mL/min/{1.73_m2} (ref >59)
GFR calc non Af Amer: 89 mL/min/{1.73_m2} (ref >59)
Globulin, Total: 1.7 g/dL (ref 1.5–4.5)
Glucose: 112 mg/dL — ABNORMAL HIGH (ref 65–99)
Potassium: 4.4 mmol/L (ref 3.5–5.2)
Sodium: 139 mmol/L (ref 134–144)
Total Protein: 6.4 g/dL (ref 6.0–8.5)

## 2019-11-27 LAB — CBC WITH DIFFERENTIAL/PLATELET
Basophils Absolute: 0 10*3/uL (ref 0.0–0.2)
Basos: 0 %
EOS (ABSOLUTE): 0.2 10*3/uL (ref 0.0–0.4)
Eos: 2 %
Hematocrit: 39.4 % (ref 37.5–51.0)
Hemoglobin: 13.9 g/dL (ref 13.0–17.7)
Immature Grans (Abs): 0 10*3/uL (ref 0.0–0.1)
Immature Granulocytes: 0 %
Lymphocytes Absolute: 2.4 10*3/uL (ref 0.7–3.1)
Lymphs: 31 %
MCH: 30 pg (ref 26.6–33.0)
MCHC: 35.3 g/dL (ref 31.5–35.7)
MCV: 85 fL (ref 79–97)
Monocytes Absolute: 0.5 10*3/uL (ref 0.1–0.9)
Monocytes: 6 %
Neutrophils Absolute: 4.8 10*3/uL (ref 1.4–7.0)
Neutrophils: 61 %
Platelets: 212 10*3/uL (ref 150–450)
RBC: 4.64 x10E6/uL (ref 4.14–5.80)
RDW: 13.9 % (ref 11.6–15.4)
WBC: 7.9 10*3/uL (ref 3.4–10.8)

## 2019-11-27 LAB — LIPID PANEL
Chol/HDL Ratio: 3.6 ratio (ref 0.0–5.0)
Cholesterol, Total: 171 mg/dL (ref 100–199)
HDL: 47 mg/dL (ref >39)
LDL Chol Calc (NIH): 101 mg/dL — ABNORMAL HIGH (ref 0–99)
Triglycerides: 131 mg/dL (ref 0–149)
VLDL Cholesterol Cal: 23 mg/dL (ref 5–40)

## 2019-11-27 LAB — MICROALBUMIN / CREATININE URINE RATIO
Creatinine, Urine: 42.8 mg/dL
Microalb/Creat Ratio: <7 mg/g creat (ref 0–29)
Microalbumin, Urine: <3 ug/mL

## 2019-11-27 NOTE — Telephone Encounter (Signed)
  REFERRAL REQUEST Telephone Note 11/27/2019  What type of referral do you need? Aqua therapy  Have you been seen at our office for this problem?yes needs it for his neck and back and right knee, had knee replacement (Advise that they may need an appointment with their PCP before a referral can be done)  Is there a particular doctor or location that you prefer? [facility]: Alex [name]  Patient notified that referrals can take up to a week or longer to process. If they haven't heard anything within a week they should call back and speak with the referral department.

## 2019-12-04 NOTE — Telephone Encounter (Signed)
Placed referral for water rehab

## 2019-12-09 ENCOUNTER — Ambulatory Visit: Payer: Medicare HMO | Admitting: Vascular Surgery

## 2019-12-09 ENCOUNTER — Encounter: Payer: Self-pay | Admitting: Vascular Surgery

## 2019-12-09 ENCOUNTER — Other Ambulatory Visit: Payer: Self-pay

## 2019-12-09 VITALS — BP 151/88 | HR 94 | Temp 97.7°F | Resp 18 | Ht 66.0 in | Wt 235.0 lb

## 2019-12-09 DIAGNOSIS — R6889 Other general symptoms and signs: Secondary | ICD-10-CM | POA: Diagnosis not present

## 2019-12-09 DIAGNOSIS — I83813 Varicose veins of bilateral lower extremities with pain: Secondary | ICD-10-CM

## 2019-12-09 NOTE — Progress Notes (Signed)
Referring Physician: Dr Dettinger  Patient name: Alex [name] MRN: [id number] DOB: [birth year] Sex: male  REASON FOR CONSULT: Symptomatic varicose veins with pain  HPI: Alex [name] is a 59 y.o. male, with long history of slowly worsening varicose veins both lower extremities.  Right leg is worse than the left.  He has a cluster of varicosities on the right medial calf that hurts and becomes progressively more painful as the day progresses.  He has not worn compression stockings in the past.  He does use tobacco.  He was counseled against this today.  Other chronic medical problems include diabetes hypertension sleep apnea.  He has no prior history of DVT.  He does have a family history of varicose veins in his father.  He is trying to exercise more to lose weight and has been limited in his abilities to do this due to pain over his varicose veins.  Past Medical History:  Diagnosis Date   Diabetes mellitus without complication (HCC)    History of kidney stones    removed 5 months ago   Hypertension    PONV (postoperative nausea and vomiting)    AGITATION after anesthesia as well   Sleep apnea    NO CPAP   Past Surgical History:  Procedure Laterality Date   ANTERIOR CERVICAL DECOMP/DISCECTOMY FUSION N/A 03/27/2018   Procedure: ANTERIOR CERVICAL DECOMPRESSION/DISCECTOMY FUSION C3-5;  Surgeon: Venita Lick, MD;  Location: [facility] OR;  Service: Orthopedics;  Laterality: N/A;  3.5 hrs   arm surgery Right    x2 involving tendons/ulnar nerve   BACK SURGERY     CARPAL TUNNEL RELEASE Right    clavical surgery     EYE SURGERY Bilateral    glaucoma   HERNIA REPAIR     JOINT REPLACEMENT Right 06/2011   TOTAL KNEE REVISION Right 04/10/2019   Procedure: RIGHT TOTAL KNEE REVISION;  Surgeon: Kathryne Hitch, MD;  Location: [facility]S;  Service: Orthopedics;  Laterality: Right;   TRICEPS TENDON REPAIR      Family History  Problem Relation Age of Onset   Cancer Mother     Diabetes Father    Hypertension Father    Heart attack Father     SOCIAL HISTORY: Social History   Socioeconomic History   Marital status: Married    Spouse name: Not on file   Number of children: Not on file   Years of education: Not on file   Highest education level: Not on file  Occupational History   Not on file  Tobacco Use   Smoking status: Never Smoker   Smokeless tobacco: Current User    Types: Chew  Substance and Sexual Activity   Alcohol use: No   Drug use: No   Sexual activity: Not on file  Other Topics Concern   Not on file  Social History Narrative   Not on file   Social Determinants of Health   Financial Resource Strain:    Difficulty of Paying Living Expenses:   Food Insecurity:    Worried About Programme researcher, broadcasting/film/video in the Last Year:    Barista in the Last Year:   Transportation Needs:    Freight forwarder (Medical):    Lack of Transportation (Non-Medical):   Physical Activity:    Days of Exercise per Week:    Minutes of Exercise per Session:   Stress:    Feeling of Stress :   Social Connections:    Frequency of Communication with  Friends and Family:    Frequency of Social Gatherings with Friends and Family:    Attends Religious Services:    Active Member of Clubs or Organizations:    Attends Banker Meetings:    Marital Status:   Intimate Partner Violence:    Fear of Current or Ex-Partner:    Emotionally Abused:    Physically Abused:    Sexually Abused:     Allergies  Allergen Reactions   Morphine And Related Nausea Only   Buprenorphine Hcl-Naloxone Hcl Nausea Only   Codeine Nausea Only and Other (See Comments)    per pt, "felt weird in my head"    Current Outpatient Medications  Medication Sig Dispense Refill   diclofenac (VOLTAREN) 75 MG EC tablet Take 1 tablet (75 mg total) by mouth 2 (two) times daily as needed. 60 tablet 3   diclofenac Sodium (VOLTAREN) 1 % GEL Apply  2 g topically 4 (four) times daily. 150 g 1   ketoconazole (NIZORAL) 2 % shampoo APPLY TOPICALLY 2 TIMES A WEEK 120 mL 0   lisinopril (ZESTRIL) 40 MG tablet Take 1 tablet (40 mg total) by mouth daily. 90 tablet 3   metFORMIN (GLUCOPHAGE) 500 MG tablet Take 2 tablets (1,000 mg total) by mouth 2 (two) times daily with a meal. TAKE 2 TABLET BY MOUTH TWICE A DAY 360 tablet 3   methocarbamol (ROBAXIN) 500 MG tablet Take 1 tablet (500 mg total) by mouth every 8 (eight) hours as needed for muscle spasms. 90 tablet 1   oxymetazoline (AFRIN) 0.05 % nasal spray Place 1 spray into both nostrils every 6 (six) hours as needed for congestion.      pregabalin (LYRICA) 75 MG capsule Take 1 capsule (75 mg total) by mouth 2 (two) times daily as needed (for neuropathy pain). 60 capsule 5   No current facility-administered medications for this visit.    ROS:   General:  No weight loss, Fever, chills  HEENT: No recent headaches, no nasal bleeding, no visual changes, no sore throat  Neurologic: No dizziness, blackouts, seizures. No recent symptoms of stroke or mini- stroke. No recent episodes of slurred speech, or temporary blindness.  Cardiac: No recent episodes of chest pain/pressure, no shortness of breath at rest.  No shortness of breath with exertion.  Denies history of atrial fibrillation or irregular heartbeat  Vascular: No history of rest pain in feet.  No history of claudication.  No history of non-healing ulcer, No history of DVT   Pulmonary: No home oxygen, no productive cough, no hemoptysis,  No asthma or wheezing  Musculoskeletal:  [X]  Arthritis, [X]  Low back pain,  [X]  Joint pain  Hematologic:No history of hypercoagulable state.  No history of easy bleeding.  No history of anemia  Gastrointestinal: No hematochezia or melena,  No gastroesophageal reflux, no trouble swallowing  Urinary: [ ]  chronic Kidney disease, [ ]  on HD - [ ]  MWF or [ ]  TTHS, [ ]  Burning with urination, [ ]  Frequent  urination, [ ]  Difficulty urinating;   Skin: No rashes  Psychological: No history of anxiety,  No history of depression   Physical Examination  Vitals:   12/09/19 1010  BP: (!) 151/88  Pulse: 94  Resp: 18  Temp: 97.7 F (36.5 C)  TempSrc: Temporal  SpO2: 96%  Weight: 235 lb (106.6 kg)  Height: 5\' 6"  (1.676 m)    Body mass index is 37.93 kg/m.  General:  Alert and oriented, no acute distress HEENT: Normal Neck:  No JVD Cardiac: Regular Rate and Rhythm Skin: No rash, diffuse spider varicosities bilateral lower extremities.  Cluster of large varicosities right medial calf 4 to 5 mm in diameter slightly warm on palpation left leg has similar varicosities although slightly smaller.  I did a SonoSite exam of the patient's greater saphenous vein bilaterally it was 4-1/2 mm to 5 mm in the mid thigh on the right side.  It was around 3-1/2 to 4 mm in diameter from the knee to the mid thigh.  Left side was about 5 mm in similar fashion. Extremity Pulses:  2+ radial, brachial, femoral, dorsalis pedis, posterior tibial pulses bilaterally Musculoskeletal: No deformity or edema  Neurologic: Upper and lower extremity motor 5/5 and symmetric  ASSESSMENT: Symptomatic varicose veins with pain   PLAN: Patient was given a prescription today for lower extremity compression stockings thigh-high.  He will elevate his legs for symptomatic relief and wear his compression stockings.  She will follow-up in 3 months time for a venous duplex exam for reflux and consideration for laser ablation and stab avulsions especially in the right leg.  He may also need an intervention in the left leg.  This will be pending insurance approval.   Ruta Hinds, MD [facility] Office: [phone] Pager: [phone]

## 2019-12-14 ENCOUNTER — Telehealth: Payer: Self-pay | Admitting: Family Medicine

## 2019-12-14 ENCOUNTER — Other Ambulatory Visit: Payer: Self-pay | Admitting: *Deleted

## 2019-12-14 DIAGNOSIS — L219 Seborrheic dermatitis, unspecified: Secondary | ICD-10-CM

## 2019-12-14 DIAGNOSIS — I83813 Varicose veins of bilateral lower extremities with pain: Secondary | ICD-10-CM

## 2019-12-14 MED ORDER — KETOCONAZOLE 2 % EX SHAM: MEDICATED_SHAMPOO | Freq: Once | CUTANEOUS | 0 refills | Status: DC

## 2019-12-14 MED ORDER — DICLOFENAC SODIUM 75 MG PO TBEC: 75.0000 mg | DELAYED_RELEASE_TABLET | Freq: Two times a day (BID) | ORAL | 3 refills | Status: DC | PRN

## 2019-12-14 NOTE — Telephone Encounter (Signed)
Sent in refills for the patient

## 2019-12-14 NOTE — Telephone Encounter (Signed)
  Prescription Request  12/14/2019  What is the name of the medication or equipment?  diclofenac (VOLTAREN) 75 MG EC tablet ketoconazole (NIZORAL) 2 % shampoo Have you contacted your pharmacy to request a refill? (if applicable) no--should pt still have refill  Which pharmacy would you like this sent to? [facility]    Patient notified that their request is being sent to the clinical staff for review and that they should receive a response within 2 business days.

## 2019-12-14 NOTE — Telephone Encounter (Signed)
Patient aware.

## 2019-12-24 DIAGNOSIS — M542 Cervicalgia: Secondary | ICD-10-CM | POA: Diagnosis not present

## 2019-12-24 DIAGNOSIS — M545 Low back pain: Secondary | ICD-10-CM | POA: Diagnosis not present

## 2019-12-24 DIAGNOSIS — M25561 Pain in right knee: Secondary | ICD-10-CM | POA: Diagnosis not present

## 2019-12-24 DIAGNOSIS — E119 Type 2 diabetes mellitus without complications: Secondary | ICD-10-CM | POA: Diagnosis not present

## 2019-12-24 DIAGNOSIS — R6889 Other general symptoms and signs: Secondary | ICD-10-CM | POA: Diagnosis not present

## 2019-12-25 ENCOUNTER — Emergency Department (HOSPITAL_COMMUNITY)
Admission: EM | Admit: 2019-12-25 | Discharge: 2019-12-25 | Disposition: A | Discharge status: Home / Self Care | Payer: Medicare HMO | Attending: Emergency Medicine | Admitting: Emergency Medicine

## 2019-12-25 ENCOUNTER — Other Ambulatory Visit: Payer: Self-pay

## 2019-12-25 ENCOUNTER — Encounter (HOSPITAL_COMMUNITY): Payer: Self-pay | Admitting: Emergency Medicine

## 2019-12-25 DIAGNOSIS — Z79899 Other long term (current) drug therapy: Secondary | ICD-10-CM | POA: Insufficient documentation

## 2019-12-25 DIAGNOSIS — Y939 Activity, unspecified: Secondary | ICD-10-CM | POA: Diagnosis not present

## 2019-12-25 DIAGNOSIS — M7021 Olecranon bursitis, right elbow: Secondary | ICD-10-CM | POA: Diagnosis not present

## 2019-12-25 DIAGNOSIS — I10 Essential (primary) hypertension: Secondary | ICD-10-CM | POA: Insufficient documentation

## 2019-12-25 DIAGNOSIS — Z7984 Long term (current) use of oral hypoglycemic drugs: Secondary | ICD-10-CM | POA: Insufficient documentation

## 2019-12-25 DIAGNOSIS — R609 Edema, unspecified: Secondary | ICD-10-CM | POA: Diagnosis not present

## 2019-12-25 DIAGNOSIS — Z96651 Presence of right artificial knee joint: Secondary | ICD-10-CM | POA: Insufficient documentation

## 2019-12-25 DIAGNOSIS — M25521 Pain in right elbow: Secondary | ICD-10-CM | POA: Diagnosis present

## 2019-12-25 DIAGNOSIS — Z886 Allergy status to analgesic agent status: Secondary | ICD-10-CM | POA: Diagnosis not present

## 2019-12-25 DIAGNOSIS — R5381 Other malaise: Secondary | ICD-10-CM | POA: Diagnosis not present

## 2019-12-25 DIAGNOSIS — E119 Type 2 diabetes mellitus without complications: Secondary | ICD-10-CM | POA: Insufficient documentation

## 2019-12-25 DIAGNOSIS — R52 Pain, unspecified: Secondary | ICD-10-CM | POA: Diagnosis not present

## 2019-12-25 MED ORDER — HYDROCODONE-ACETAMINOPHEN 5-325 MG PO TABS
2.0000 | ORAL_TABLET | Freq: Once | ORAL | Status: AC
  Administered 2019-12-25: 2 via ORAL
  Filled 2019-12-25: qty 2

## 2019-12-25 MED ORDER — PREDNISONE 20 MG PO TABS
20.0000 mg | ORAL_TABLET | Freq: Two times a day (BID) | ORAL | 0 refills | Status: DC
Start: 2019-12-25 — End: 2019-12-30

## 2019-12-25 MED ORDER — HYDROCODONE-ACETAMINOPHEN 5-325 MG PO TABS: 2.0000 | ORAL_TABLET | ORAL | 0 refills | Status: DC | PRN

## 2019-12-25 MED ORDER — PREDNISONE 50 MG PO TABS
60.0000 mg | ORAL_TABLET | Freq: Once | ORAL | Status: AC
  Administered 2019-12-25: 60 mg via ORAL
  Filled 2019-12-25: qty 1

## 2019-12-25 NOTE — Discharge Instructions (Signed)
Use heat on the sore area of your elbow to improve the bursitis.  Take the medicines prescribed, as directed.  See your orthopedist for checkup next week.

## 2019-12-25 NOTE — ED Triage Notes (Signed)
Patient states right elbow pain that started this morning. Elbow is warm to touch. Patient denies any injury. Patient also complains of neck pain and denies any new injury.

## 2019-12-25 NOTE — ED Provider Notes (Signed)
[facility] EMERGENCY DEPARTMENT Provider Note   CSN: [id number] Arrival date & time: 12/25/19  2225     History Chief Complaint  Patient presents with  . elbow pain    right    Alex [name] is a 59 y.o. male.  HPI He presents for evaluation of right elbow pain and a sensation of warmth, which started this morning.  He also complains of ongoing neck, bilateral shoulder, and right arm pain, for 1 year.  He is seeing physical therapy, and had a session today.  No known trauma to the right elbow.  No similar problems with the elbow.  He denies fever, chills, nausea, vomiting, weakness or dizziness.  There are no other known modifying factors.    Past Medical History:  Diagnosis Date  . Diabetes mellitus without complication ([place])   . History of kidney stones    removed 5 months ago  . Hypertension   . PONV (postoperative nausea and vomiting)    AGITATION after anesthesia as well  . Sleep apnea    NO CPAP    Patient Active Problem List   Diagnosis Date Noted  . Chronic neck pain 08/10/2019  . Status post revision of total replacement of right knee 04/10/2019  . Loose right total knee arthroplasty ([place]) 03/09/2019  . Pain in right knee 02/17/2019  . Cervical spine degeneration 12/30/2018  . Right wrist pain 12/19/2018  . Right arm numbness 12/19/2018  . S/P cervical spinal fusion 03/27/2018  . Morbid obesity ([place]) 02/21/2018  . Diabetes mellitus type 2 in obese ([place]) 03/28/2017  . Hypertension associated with diabetes ([place]) 08/25/2016    Past Surgical History:  Procedure Laterality Date  . ANTERIOR CERVICAL DECOMP/DISCECTOMY FUSION N/A 03/27/2018   Procedure: ANTERIOR CERVICAL DECOMPRESSION/DISCECTOMY FUSION C3-5;  Surgeon: Melina Schools, MD;  Location: [place];  Service: Orthopedics;  Laterality: N/A;  3.5 hrs  . arm surgery Right    x2 involving tendons/ulnar nerve  . BACK SURGERY    . CARPAL TUNNEL RELEASE Right   . clavical surgery    . EYE SURGERY Bilateral     glaucoma  . HERNIA REPAIR    . JOINT REPLACEMENT Right 06/2011  . TOTAL KNEE REVISION Right 04/10/2019   Procedure: RIGHT TOTAL KNEE REVISION;  Surgeon: Mcarthur Rossetti, MD;  Location: [facility];  Service: Orthopedics;  Laterality: Right;  . TRICEPS TENDON REPAIR         Family History  Problem Relation Age of Onset  . Cancer Mother   . Diabetes Father   . Hypertension Father   . Heart attack Father     Social History   Tobacco Use  . Smoking status: Never Smoker  . Smokeless tobacco: Current User    Types: Chew  Vaping Use  . Vaping Use: Never used  Substance Use Topics  . Alcohol use: No  . Drug use: No    Home Medications Prior to Admission medications   Medication Sig Start Date End Date Taking? Authorizing Provider  diclofenac (VOLTAREN) 75 MG EC tablet Take 1 tablet (75 mg total) by mouth 2 (two) times daily as needed. 12/14/19   Dettinger, Fransisca Kaufmann, MD  diclofenac Sodium (VOLTAREN) 1 % GEL Apply 2 g topically 4 (four) times daily. 07/23/19   Dettinger, Fransisca Kaufmann, MD  HYDROcodone-acetaminophen (NORCO) 5-325 MG tablet Take 2 tablets by mouth every 4 (four) hours as needed. 12/25/19   Daleen Bo, MD  lisinopril (ZESTRIL) 40 MG tablet Take 1 tablet (40 mg  total) by mouth daily. 11/26/19   Dettinger, Elige Radon, MD  metFORMIN (GLUCOPHAGE) 500 MG tablet Take 2 tablets (1,000 mg total) by mouth 2 (two) times daily with a meal. TAKE 2 TABLET BY MOUTH TWICE A DAY 11/26/19   Dettinger, Elige Radon, MD  methocarbamol (ROBAXIN) 500 MG tablet Take 1 tablet (500 mg total) by mouth every 8 (eight) hours as needed for muscle spasms. 08/10/19   Marcello Fennel, MD  oxymetazoline (AFRIN) 0.05 % nasal spray Place 1 spray into both nostrils every 6 (six) hours as needed for congestion.     [provider]  predniSONE (DELTASONE) 20 MG tablet Take 1 tablet (20 mg total) by mouth 2 (two) times daily. 12/25/19   Mancel Bale, MD  pregabalin (LYRICA) 75 MG capsule Take 1 capsule (75 mg  total) by mouth 2 (two) times daily as needed (for neuropathy pain). 04/02/19   Dettinger, Elige Radon, MD    Allergies    Morphine and related, Buprenorphine hcl-naloxone hcl, and Codeine  Review of Systems   Review of Systems  All other systems reviewed and are negative.   Physical Exam Updated Vital Signs BP (!) 190/112 (BP Location: Left Arm)   Pulse (!) 108   Temp 98.4 F (36.9 C) (Oral)   Resp 18   Ht 5\' 6"  (1.676 m)   Wt 105.2 kg   SpO2 98%   BMI 37.45 kg/m   Physical Exam Vitals and nursing note reviewed.  Constitutional:      General: He is not in acute distress.    Appearance: He is well-developed. He is not ill-appearing, toxic-appearing or diaphoretic.  HENT:     Head: Normocephalic and atraumatic.     Right Ear: External ear normal.     Left Ear: External ear normal.  Eyes:     Conjunctiva/sclera: Conjunctivae normal.     Pupils: Pupils are equal, round, and reactive to light.  Neck:     Trachea: Phonation normal.  Cardiovascular:     Rate and Rhythm: Normal rate.     Heart sounds: Normal heart sounds.  Pulmonary:     Effort: Pulmonary effort is normal.  Abdominal:     General: There is no distension.  Musculoskeletal:     Cervical back: Normal range of motion.     Comments: Right elbow warm, red and somewhat limited extension secondary to pain.  Clinical evidence present for acute bursitis.  No proximal streaking, neurovascular intact distally.  Skin:    General: Skin is warm and dry.  Neurological:     Mental Status: He is alert and oriented to person, place, and time.     Cranial Nerves: No cranial nerve deficit.     Sensory: No sensory deficit.     Motor: No abnormal muscle tone.     Coordination: Coordination normal.  Psychiatric:        Mood and Affect: Mood normal.        Behavior: Behavior normal.        Thought Content: Thought content normal.        Judgment: Judgment normal.     ED Results / Procedures / Treatments   Labs (all labs  ordered are listed, but only abnormal results are displayed) Labs Reviewed - No data to display  EKG None  Radiology No results found.  Procedures Procedures (including critical care time)  Medications Ordered in ED Medications  HYDROcodone-acetaminophen (NORCO/VICODIN) 5-325 MG per tablet 2 tablet (has no administration in time  range)  predniSONE (DELTASONE) tablet 60 mg (has no administration in time range)    ED Course  I have reviewed the triage vital signs and the nursing notes.  Pertinent labs & imaging results that were available during my care of the patient were reviewed by me and considered in my medical decision making (see chart for details).    MDM Rules/Calculators/A&P                           Patient Vitals for the past 24 hrs:  BP Temp Temp src Pulse Resp SpO2 Height Weight  12/25/19 2226 -- -- -- -- -- -- 5\' 6"  (1.676 m) 105.2 kg  12/25/19 2225 (!) 190/112 98.4 F (36.9 C) Oral (!) 108 18 98 % -- --    11:01 PM Reevaluation with update and discussion. After initial assessment and treatment, an updated evaluation reveals he remains fairly comfortable.  Findings discussed and questions answered. 12/27/19   Medical Decision Making:  This patient is presenting for evaluation of right elbow pain, which does not require a range of treatment options, and is not a complaint that involves a high risk of morbidity and mortality. The differential diagnoses include cervical radiculopathy, right elbow injury, bursitis. I decided to review old records, and in summary patient with ongoing neck and shoulder pain, being managed by physical therapy..  I did not require additional historical information from anyone.    Critical Interventions-clinical evaluation, observation, medication treatment and reassessment  After These Interventions, the Patient was reevaluated and was found stable for discharge.  Clinically patient has bursitis.  Doubt gout, septic arthritis,  cervical radiculopathy or fracture.  CRITICAL CARE-no Performed by: Mancel Bale  Nursing Notes Reviewed/ Care Coordinated Applicable Imaging Reviewed Interpretation of Laboratory Data incorporated into ED treatment  The patient appears reasonably screened and/or stabilized for discharge and I doubt any other medical condition or other [facility] requiring further screening, evaluation, or treatment in the ED at this time prior to discharge.  Plan: Home Medications-usual medications; Home Treatments-heat to affected area; return here if the recommended treatment, does not improve the symptoms; Recommended follow up-PCP checkup and as needed     Final Clinical Impression(s) / ED Diagnoses Final diagnoses:  Olecranon bursitis of right elbow    Rx / DC Orders ED Discharge Orders         Ordered    predniSONE (DELTASONE) 20 MG tablet  2 times daily     Discontinue  Reprint     12/25/19 2301    HYDROcodone-acetaminophen (NORCO) 5-325 MG tablet  Every 4 hours PRN     Discontinue  Reprint     12/25/19 2301           12/27/19, MD 12/27/19 1055

## 2019-12-29 ENCOUNTER — Telehealth: Payer: Self-pay | Admitting: Family Medicine

## 2019-12-29 NOTE — Telephone Encounter (Signed)
Spoke to pt and given appt with Dr Darlyn Read tomorrow at 3:55.

## 2019-12-30 ENCOUNTER — Ambulatory Visit (INDEPENDENT_AMBULATORY_CARE_PROVIDER_SITE_OTHER): Payer: Medicare HMO | Admitting: Family Medicine

## 2019-12-30 ENCOUNTER — Ambulatory Visit (INDEPENDENT_AMBULATORY_CARE_PROVIDER_SITE_OTHER): Payer: Medicare HMO

## 2019-12-30 ENCOUNTER — Encounter: Payer: Self-pay | Admitting: Family Medicine

## 2019-12-30 ENCOUNTER — Other Ambulatory Visit: Payer: Self-pay

## 2019-12-30 VITALS — BP 132/84 | HR 78 | Temp 96.5°F | Resp 20 | Ht 66.0 in | Wt 233.5 lb

## 2019-12-30 DIAGNOSIS — M25521 Pain in right elbow: Secondary | ICD-10-CM | POA: Diagnosis not present

## 2019-12-30 DIAGNOSIS — M7989 Other specified soft tissue disorders: Secondary | ICD-10-CM | POA: Diagnosis not present

## 2019-12-30 DIAGNOSIS — M25561 Pain in right knee: Secondary | ICD-10-CM | POA: Diagnosis not present

## 2019-12-30 DIAGNOSIS — R6889 Other general symptoms and signs: Secondary | ICD-10-CM | POA: Diagnosis not present

## 2019-12-30 DIAGNOSIS — M545 Low back pain: Secondary | ICD-10-CM | POA: Diagnosis not present

## 2019-12-30 DIAGNOSIS — M542 Cervicalgia: Secondary | ICD-10-CM | POA: Diagnosis not present

## 2019-12-30 DIAGNOSIS — E119 Type 2 diabetes mellitus without complications: Secondary | ICD-10-CM | POA: Diagnosis not present

## 2019-12-30 MED ORDER — AMOXICILLIN-POT CLAVULANATE 875-125 MG PO TABS
1.0000 | ORAL_TABLET | Freq: Two times a day (BID) | ORAL | 0 refills | Status: DC
Start: 2019-12-30 — End: 2020-01-13

## 2019-12-30 MED ORDER — PREDNISONE 10 MG PO TABS
ORAL_TABLET | ORAL | 0 refills | Status: DC
Start: 2019-12-30 — End: 2020-01-13

## 2019-12-30 NOTE — Progress Notes (Signed)
Chief Complaint  Patient presents with  . right elbow pain    HPI  Patient presents today for right elbow pain.  This has been present about 5 days now.  It started on the first morning with swelling and pain without any known injury at the right elbow.  It increased through the day therefore he went on to the emergency department.  He was given prednisone there.  Report from the emergency department was reviewed.  No x-rays were made.  PMH: Smoking status noted ROS: Per HPI  Objective: BP 132/84   Pulse 78   Temp (!) 96.5 F (35.8 C) (Temporal)   Resp 20   Ht 5\' 6"  (1.676 m)   Wt 233 lb 8 oz (105.9 kg)   SpO2 95%   BMI 37.69 kg/m  Gen: NAD, alert, cooperative with exam HEENT: NCAT, Ext: Right elbow is tender at the region of the radial head.  There is moderate edema.  There is full range of motion.  Some tenderness with resisted flexion and extension noted.  However there is full range of motion and the extremity is neurovascularly intact.  There is moderate erythema surrounding the area. Neuro: Alert and oriented, No gross deficits X-ray: Final report states that there is some soft tissue swelling.  Question for olecranon bursitis was noted.  However the swelling is not in the typical place for that on exam Assessment and plan:  1. Elbow pain, right     Meds ordered this encounter  Medications  . amoxicillin-clavulanate (AUGMENTIN) 875-125 MG tablet    Sig: Take 1 tablet by mouth 2 (two) times daily. Take all of this medication    Dispense:  20 tablet    Refill:  0  . predniSONE (DELTASONE) 10 MG tablet    Sig: Take 5 daily for 2 days followed by 4,3,2 and 1 for 2 days each.    Dispense:  30 tablet    Refill:  0    Orders Placed This Encounter  Procedures  . DG Elbow 2 Views Right    Order Specific Question:   Reason for Exam (SYMPTOM  OR DIAGNOSIS REQUIRED)    Answer:   right elbow pain    Order Specific Question:   Preferred imaging location?    Answer:    Internal   Although this appears to be a cellulitis so overlying a bursitis, the possibility of gout has not been ruled out.  However the range of motion would imply that it is not gout because the tenderness with motion would be expected to be much greater.  Follow up as needed.  , MD

## 2019-12-31 ENCOUNTER — Telehealth: Payer: Self-pay | Admitting: Family Medicine

## 2019-12-31 NOTE — Telephone Encounter (Signed)
Had questions regarding taking his prednisone dose pack. He wanted to know if he should take the entire dose at one time or spread it out through the day. Advised pt to take the dose first thing in the morning and titrate down accordingly. Pt voiced understanding.

## 2020-01-01 DIAGNOSIS — R6889 Other general symptoms and signs: Secondary | ICD-10-CM | POA: Diagnosis not present

## 2020-01-01 DIAGNOSIS — M542 Cervicalgia: Secondary | ICD-10-CM | POA: Diagnosis not present

## 2020-01-01 DIAGNOSIS — E119 Type 2 diabetes mellitus without complications: Secondary | ICD-10-CM | POA: Diagnosis not present

## 2020-01-01 DIAGNOSIS — M545 Low back pain: Secondary | ICD-10-CM | POA: Diagnosis not present

## 2020-01-01 DIAGNOSIS — M25561 Pain in right knee: Secondary | ICD-10-CM | POA: Diagnosis not present

## 2020-01-03 ENCOUNTER — Encounter: Payer: Self-pay | Admitting: Family Medicine

## 2020-01-07 DIAGNOSIS — M545 Low back pain: Secondary | ICD-10-CM | POA: Diagnosis not present

## 2020-01-07 DIAGNOSIS — M542 Cervicalgia: Secondary | ICD-10-CM | POA: Diagnosis not present

## 2020-01-07 DIAGNOSIS — M25561 Pain in right knee: Secondary | ICD-10-CM | POA: Diagnosis not present

## 2020-01-07 DIAGNOSIS — E119 Type 2 diabetes mellitus without complications: Secondary | ICD-10-CM | POA: Diagnosis not present

## 2020-01-07 DIAGNOSIS — R6889 Other general symptoms and signs: Secondary | ICD-10-CM | POA: Diagnosis not present

## 2020-01-13 ENCOUNTER — Telehealth: Payer: Self-pay | Admitting: Family Medicine

## 2020-01-13 ENCOUNTER — Other Ambulatory Visit: Payer: Self-pay | Admitting: Family Medicine

## 2020-01-13 DIAGNOSIS — M542 Cervicalgia: Secondary | ICD-10-CM | POA: Diagnosis not present

## 2020-01-13 DIAGNOSIS — M545 Low back pain: Secondary | ICD-10-CM | POA: Diagnosis not present

## 2020-01-13 DIAGNOSIS — M25561 Pain in right knee: Secondary | ICD-10-CM | POA: Diagnosis not present

## 2020-01-13 DIAGNOSIS — E119 Type 2 diabetes mellitus without complications: Secondary | ICD-10-CM | POA: Diagnosis not present

## 2020-01-13 DIAGNOSIS — R6889 Other general symptoms and signs: Secondary | ICD-10-CM | POA: Diagnosis not present

## 2020-01-13 MED ORDER — INDOMETHACIN 50 MG PO CAPS: 50.0000 mg | ORAL_CAPSULE | Freq: Two times a day (BID) | ORAL | 0 refills | Status: DC

## 2020-01-13 NOTE — Telephone Encounter (Signed)
Pt saw Dr Darlyn Read on 12/30/19 for elbow pain and was prescribed some medicine to take. Pt is requesting that Dr Dettinger or Dr Darlyn Read send in something else because what he was taking has not helped. Says his elbow is burning.

## 2020-01-13 NOTE — Telephone Encounter (Signed)
Patient was prescribed Amoxicillin875mg  and Prednisone 10mg . Patient states did not helping asking for something else. Please advise.

## 2020-01-13 NOTE — Telephone Encounter (Signed)
Please let the patient know that I sent their prescription to their pharmacy. Thanks, WS 

## 2020-01-14 NOTE — Telephone Encounter (Signed)
Patient aware, script is ready. 

## 2020-01-15 NOTE — Telephone Encounter (Signed)
He plans to do some research about gout and he will want provider to check his blood work for this some time.

## 2020-01-15 NOTE — Telephone Encounter (Signed)
Just until the pain is better. After that just as needed is fine.

## 2020-01-15 NOTE — Telephone Encounter (Signed)
Directions please 

## 2020-01-15 NOTE — Telephone Encounter (Signed)
Unable to reach patient. No voicemail.

## 2020-01-15 NOTE — Telephone Encounter (Signed)
Pt called to let Dr Darlyn Read know that the Indomethacin Rx he just starting taking for gout is already helping and pt can already see improvement. Pt wants to know if he is supposed to take all of the medicine as directed or just until he feels better?

## 2020-01-20 DIAGNOSIS — R6889 Other general symptoms and signs: Secondary | ICD-10-CM | POA: Diagnosis not present

## 2020-01-20 DIAGNOSIS — E119 Type 2 diabetes mellitus without complications: Secondary | ICD-10-CM | POA: Diagnosis not present

## 2020-01-20 DIAGNOSIS — M25561 Pain in right knee: Secondary | ICD-10-CM | POA: Diagnosis not present

## 2020-01-20 DIAGNOSIS — M542 Cervicalgia: Secondary | ICD-10-CM | POA: Diagnosis not present

## 2020-01-20 DIAGNOSIS — M545 Low back pain: Secondary | ICD-10-CM | POA: Diagnosis not present

## 2020-01-29 ENCOUNTER — Other Ambulatory Visit: Payer: Self-pay | Admitting: Family Medicine

## 2020-01-29 DIAGNOSIS — L219 Seborrheic dermatitis, unspecified: Secondary | ICD-10-CM

## 2020-02-29 ENCOUNTER — Ambulatory Visit: Payer: Medicare HMO | Admitting: Family Medicine

## 2020-03-15 ENCOUNTER — Other Ambulatory Visit: Payer: Self-pay | Admitting: Family Medicine

## 2020-03-15 DIAGNOSIS — L219 Seborrheic dermatitis, unspecified: Secondary | ICD-10-CM

## 2020-03-16 ENCOUNTER — Telehealth: Payer: Self-pay | Admitting: Family Medicine

## 2020-03-16 NOTE — Telephone Encounter (Signed)
Pharmacy aware and verbalized understanding.

## 2020-03-16 NOTE — Telephone Encounter (Signed)
Please let them know that we have discussed it with the patient and he is declined but I will discuss it with him on next visit.

## 2020-03-23 ENCOUNTER — Encounter (HOSPITAL_COMMUNITY): Payer: Medicare HMO

## 2020-03-23 ENCOUNTER — Ambulatory Visit: Payer: Medicare HMO | Admitting: Vascular Surgery

## 2020-03-30 ENCOUNTER — Ambulatory Visit: Payer: Medicare HMO | Admitting: Vascular Surgery

## 2020-03-30 ENCOUNTER — Encounter (HOSPITAL_COMMUNITY): Payer: Medicare HMO

## 2020-04-06 ENCOUNTER — Other Ambulatory Visit: Payer: Self-pay

## 2020-04-06 ENCOUNTER — Ambulatory Visit (INDEPENDENT_AMBULATORY_CARE_PROVIDER_SITE_OTHER): Payer: Medicare HMO | Admitting: Family Medicine

## 2020-04-06 ENCOUNTER — Encounter: Payer: Self-pay | Admitting: Family Medicine

## 2020-04-06 VITALS — BP 161/97 | HR 87 | Temp 98.0°F | Ht 66.0 in | Wt 238.0 lb

## 2020-04-06 DIAGNOSIS — I10 Essential (primary) hypertension: Secondary | ICD-10-CM

## 2020-04-06 DIAGNOSIS — Z1211 Encounter for screening for malignant neoplasm of colon: Secondary | ICD-10-CM | POA: Diagnosis not present

## 2020-04-06 DIAGNOSIS — E1159 Type 2 diabetes mellitus with other circulatory complications: Secondary | ICD-10-CM

## 2020-04-06 DIAGNOSIS — E1142 Type 2 diabetes mellitus with diabetic polyneuropathy: Secondary | ICD-10-CM | POA: Diagnosis not present

## 2020-04-06 DIAGNOSIS — E669 Obesity, unspecified: Secondary | ICD-10-CM | POA: Diagnosis not present

## 2020-04-06 DIAGNOSIS — R6889 Other general symptoms and signs: Secondary | ICD-10-CM | POA: Diagnosis not present

## 2020-04-06 DIAGNOSIS — E1169 Type 2 diabetes mellitus with other specified complication: Secondary | ICD-10-CM

## 2020-04-06 DIAGNOSIS — I152 Hypertension secondary to endocrine disorders: Secondary | ICD-10-CM

## 2020-04-06 DIAGNOSIS — Z23 Encounter for immunization: Secondary | ICD-10-CM

## 2020-04-06 DIAGNOSIS — E119 Type 2 diabetes mellitus without complications: Secondary | ICD-10-CM

## 2020-04-06 LAB — BAYER DCA HB A1C WAIVED: HB A1C (BAYER DCA - WAIVED): 5.9 % (ref <7.0)

## 2020-04-06 MED ORDER — AMLODIPINE BESYLATE 5 MG PO TABS
5.0000 mg | ORAL_TABLET | Freq: Every day | ORAL | 3 refills | Status: DC
Start: 2020-04-06 — End: 2020-06-23

## 2020-04-06 MED ORDER — OZEMPIC (0.25 OR 0.5 MG/DOSE) 2 MG/1.5ML ~~LOC~~ SOPN: 0.5000 mg | PEN_INJECTOR | SUBCUTANEOUS | 3 refills | Status: DC

## 2020-04-06 MED ORDER — METFORMIN HCL 500 MG PO TABS: 500.0000 mg | ORAL_TABLET | Freq: Two times a day (BID) | ORAL | 3 refills | Status: DC

## 2020-04-06 NOTE — Progress Notes (Signed)
BP (!) 161/97   Pulse 87   Temp 98 F (36.7 C)   Ht 5\' 6"  (1.676 m)   Wt 238 lb (108 kg)   SpO2 97%   BMI 38.41 kg/m    Subjective:   Patient ID: , male    DOB: 10-16-1960, 59 y.o.   MRN: [id number]  HPI: Alex [name] is a 59 y.o. male presenting on 04/06/2020 for Medical Management of Chronic Issues, Diabetes, and Hypertension   HPI Type 2 diabetes mellitus Patient comes in today for recheck of his diabetes. Patient has been currently taking Metformin 1000 twice a day and A1c is 5.9, patient is talking that he wants to do something for weight loss. Patient is currently on an ACE inhibitor/ARB. Patient has not seen an ophthalmologist this year. Patient denies any issues with their feet. The symptom started onset as an adult hypertension ARE RELATED TO DM.  Hypertension Patient is currently on lisinopril, and their blood pressure today is 161/97 and 151/91, he says that he has been very nervous.. Patient denies any lightheadedness or dizziness. Patient denies headaches, blurred vision, chest pains, shortness of breath, or weakness. Denies any side effects from medication and is content with current medication.   Relevant past medical, surgical, family and social history reviewed and updated as indicated. Interim medical history since our last visit reviewed. Allergies and medications reviewed and updated.  Review of Systems  Constitutional: Negative for chills and fever.  Eyes: Negative for visual disturbance.  Respiratory: Negative for shortness of breath and wheezing.   Cardiovascular: Negative for chest pain and leg swelling.  Musculoskeletal: Positive for arthralgias. Negative for back pain and gait problem.  Skin: Negative for rash.  Psychiatric/Behavioral: Negative for decreased concentration, self-injury, sleep disturbance and suicidal ideas. The patient is not nervous/anxious.   All other systems reviewed and are negative.   Per HPI unless specifically  indicated above   Allergies as of 04/06/2020      Reactions   Morphine And Related Nausea Only   Buprenorphine Hcl-naloxone Hcl Nausea Only   Codeine Nausea Only, Other (See Comments)   per pt, "felt weird in my head"      Medication List       Accurate as of April 06, 2020 10:22 AM. If you have any questions, ask your nurse or doctor.        diclofenac Sodium 1 % Gel Commonly known as: Voltaren Apply 2 g topically 4 (four) times daily.   indomethacin 50 MG capsule Commonly known as: INDOCIN Take 1 capsule (50 mg total) by mouth 2 (two) times daily with a meal.   ketoconazole 2 % shampoo Commonly known as: NIZORAL SHAMPOO, LATHER, LEAVE FOR 10 MINUTES, THEN RINSE. USE 2 TIMES A WEEK FOR UP TO 4 WEEKS   lisinopril 40 MG tablet Commonly known as: ZESTRIL Take 1 tablet (40 mg total) by mouth daily.   metFORMIN 500 MG tablet Commonly known as: GLUCOPHAGE Take 2 tablets (1,000 mg total) by mouth 2 (two) times daily with a meal. TAKE 2 TABLET BY MOUTH TWICE A DAY   methocarbamol 500 MG tablet Commonly known as: Robaxin Take 1 tablet (500 mg total) by mouth every 8 (eight) hours as needed for muscle spasms.   oxymetazoline 0.05 % nasal spray Commonly known as: AFRIN Place 1 spray into both nostrils every 6 (six) hours as needed for congestion.   pregabalin 75 MG capsule Commonly known as: LYRICA Take 1 capsule (75 mg total) by  mouth 2 (two) times daily as needed (for neuropathy pain).        Objective:   BP (!) 161/97   Pulse 87   Temp 98 F (36.7 C)   Ht 5\' 6"  (1.676 m)   Wt 238 lb (108 kg)   SpO2 97%   BMI 38.41 kg/m   Wt Readings from Last 3 Encounters:  04/06/20 238 lb (108 kg)  12/30/19 233 lb 8 oz (105.9 kg)  12/25/19 232 lb (105.2 kg)    Physical Exam Vitals and nursing note reviewed.  Constitutional:      General: He is not in acute distress.    Appearance: He is well-developed. He is not diaphoretic.  Eyes:     General: No scleral  icterus.    Conjunctiva/sclera: Conjunctivae normal.  Neck:     Thyroid: No thyromegaly.  Cardiovascular:     Rate and Rhythm: Normal rate and regular rhythm.     Heart sounds: Normal heart sounds. No murmur heard.   Pulmonary:     Effort: Pulmonary effort is normal. No respiratory distress.     Breath sounds: Normal breath sounds. No wheezing.  Musculoskeletal:        General: Normal range of motion.     Cervical back: Neck supple.  Lymphadenopathy:     Cervical: No cervical adenopathy.  Skin:    General: Skin is warm and dry.     Findings: No rash.  Neurological:     Mental Status: He is alert and oriented to person, place, and time.     Coordination: Coordination normal.  Psychiatric:        Behavior: Behavior normal.       Assessment & Plan:   Problem List Items Addressed This Visit      Cardiovascular and Mediastinum   Hypertension associated with diabetes (HCC)   Relevant Medications   metFORMIN (GLUCOPHAGE) 500 MG tablet   Semaglutide,0.25 or 0.5MG /DOS, (OZEMPIC, 0.25 OR 0.5 MG/DOSE,) 2 MG/1.5ML SOPN   amLODipine (NORVASC) 5 MG tablet     Endocrine   Diabetes mellitus type 2 in obese (HCC)   Relevant Medications   metFORMIN (GLUCOPHAGE) 500 MG tablet   Semaglutide,0.25 or 0.5MG /DOS, (OZEMPIC, 0.25 OR 0.5 MG/DOSE,) 2 MG/1.5ML SOPN    Other Visit Diagnoses    Type 2 diabetes mellitus with diabetic polyneuropathy, without long-term current use of insulin (HCC)    -  Primary   Relevant Medications   metFORMIN (GLUCOPHAGE) 500 MG tablet   Semaglutide,0.25 or 0.5MG /DOS, (OZEMPIC, 0.25 OR 0.5 MG/DOSE,) 2 MG/1.5ML SOPN   Other Relevant Orders   Bayer DCA Hb A1c Waived (Completed)   Colon cancer screening       Relevant Orders   Cologuard   Flu vaccine need       Relevant Orders   Flu Vaccine QUAD 6+ mos PF IM (Fluarix Quad PF) (Completed)    Patient has been having a lot of difficulty with weight and gaining weight, and blood sugars look great but would like  something with weight, will switch to Ozempic because of weight loss indication and see if that helps curb appetite.   Will add amlodipine 5 mg because of blood pressure. Follow up plan: Return in about 3 months (around 07/06/2020), or if symptoms worsen or fail to improve, for Hypertension and diabetes recheck.  Counseling provided for all of the vaccine components Orders Placed This Encounter  Procedures  . Flu Vaccine QUAD 6+ mos PF IM (Fluarix Quad PF)  .  Bayer DCA Hb A1c Waived  . Cologuard    Arville Care, MD [facility] 04/06/2020, 10:22 AM

## 2020-04-11 ENCOUNTER — Ambulatory Visit: Payer: Self-pay

## 2020-04-11 ENCOUNTER — Ambulatory Visit (INDEPENDENT_AMBULATORY_CARE_PROVIDER_SITE_OTHER): Payer: Medicare HMO | Admitting: Orthopaedic Surgery

## 2020-04-11 ENCOUNTER — Encounter: Payer: Self-pay | Admitting: Orthopaedic Surgery

## 2020-04-11 DIAGNOSIS — Z96651 Presence of right artificial knee joint: Secondary | ICD-10-CM | POA: Diagnosis not present

## 2020-04-11 DIAGNOSIS — R6889 Other general symptoms and signs: Secondary | ICD-10-CM | POA: Diagnosis not present

## 2020-04-11 NOTE — Progress Notes (Signed)
Office Visit Note   Patient: Alex [name]           Date of Birth: 02-12-1961           MRN: [id number] Visit Date: 04/11/2020              Requested by: Nils Pyle, MD [address] [place],  [place] [address] PCP: Dettinger, Elige Radon, MD   Assessment & Plan: Visit Diagnoses:  1. History of revision of total replacement of right knee joint     Plan: At this point follow-up for his right knee can be as needed.  I have encouraged him to increase his activities and work on weight loss.  He can get back to doing any type of work duties which would be medium to low physical demand.  All questions and concerns were answered and addressed.  Follow-Up Instructions: Return if symptoms worsen or fail to improve.   Orders:  Orders Placed This Encounter  Procedures  . XR Knee 1-2 Views Right   No orders of the defined types were placed in this encounter.     Procedures: No procedures performed   Clinical Data: No additional findings.   Subjective: Chief Complaint  Patient presents with  . Right Knee - Follow-up  The patient is now 1 year out from a revision arthroplasty of a failed right total knee arthroplasty.  Originally was done elsewhere.  It had loosening of the tibial tray with osteolysis around the anterior tibia.  We were able to revise the knee to the cemented revision components with stems as well.  He says that he has some aching at times but overall his range of motion is good and he denies any significant swelling or significant pain like he had before.  He denies any other acute change in medical status other than weight gain from inactivity.  HPI  Review of Systems He currently denies any headache, chest pain, shortness of breath, fever, chills, nausea, vomiting  Objective: Vital Signs: There were no vitals taken for this visit.  Physical Exam He is alert and oriented x3 and in no acute distress Ortho Exam Examination of his right knee shows no  effusion.  Incisions healed nicely.  He has excellent range of motion of the knee and is ligamentously stable. Specialty Comments:  No specialty comments available.  Imaging: XR Knee 1-2 Views Right  Result Date: 04/11/2020 2 views of the right knee show well-seated revision arthroplasty with no complicating features.    PMFS History: Patient Active Problem List   Diagnosis Date Noted  . Chronic neck pain 08/10/2019  . Status post revision of total replacement of right knee 04/10/2019  . Loose right total knee arthroplasty ([facility]CC) 03/09/2019  . Pain in right knee 02/17/2019  . Cervical spine degeneration 12/30/2018  . Right wrist pain 12/19/2018  . Right arm numbness 12/19/2018  . S/P cervical spinal fusion 03/27/2018  . Morbid obesity (HCC) 02/21/2018  . Diabetes mellitus type 2 in obese (HCC) 03/28/2017  . Hypertension associated with diabetes ([facility]CC) 08/25/2016   Past Medical History:  Diagnosis Date  . Diabetes mellitus without complication ([facility])   . History of kidney stones    removed 5 months ago  . Hypertension   . PONV (postoperative nausea and vomiting)    AGITATION after anesthesia as well  . Sleep apnea    NO CPAP    Family History  Problem Relation Age of Onset  . Cancer Mother   .  Diabetes Father   . Hypertension Father   . Heart attack Father     Past Surgical History:  Procedure Laterality Date  . ANTERIOR CERVICAL DECOMP/DISCECTOMY FUSION N/A 03/27/2018   Procedure: ANTERIOR CERVICAL DECOMPRESSION/DISCECTOMY FUSION C3-5;  Surgeon: Venita Lick, MD;  Location: [facility] OR;  Service: Orthopedics;  Laterality: N/A;  3.5 hrs  . arm surgery Right    x2 involving tendons/ulnar nerve  . BACK SURGERY    . CARPAL TUNNEL RELEASE Right   . clavical surgery    . EYE SURGERY Bilateral    glaucoma  . HERNIA REPAIR    . JOINT REPLACEMENT Right 06/2011  . TOTAL KNEE REVISION Right 04/10/2019   Procedure: RIGHT TOTAL KNEE REVISION;  Surgeon: Kathryne Hitch, MD;   Location: [facility];  Service: Orthopedics;  Laterality: Right;  . TRICEPS TENDON REPAIR     Social History   Occupational History  . Not on file  Tobacco Use  . Smoking status: Never Smoker  . Smokeless tobacco: Current User    Types: Chew  Vaping Use  . Vaping Use: Never used  Substance and Sexual Activity  . Alcohol use: No  . Drug use: No  . Sexual activity: Not on file

## 2020-04-13 ENCOUNTER — Encounter (HOSPITAL_COMMUNITY): Payer: Medicare HMO

## 2020-04-13 ENCOUNTER — Telehealth: Payer: Self-pay

## 2020-04-13 ENCOUNTER — Ambulatory Visit: Payer: Medicare HMO | Admitting: Vascular Surgery

## 2020-04-13 NOTE — Telephone Encounter (Signed)
Can you have patient schedule with me so we can get him free medication?  30 min pharmacy clinic appt  He needs to bring his and spouse's (if applicable) proof of income (I.e social security benefits statement showing how much they get per month OR bank statement OR tax returns if they file)  We should be getting some samples next week  Thank you! Raynelle Fanning

## 2020-04-13 NOTE — Telephone Encounter (Signed)
Patient cannot afford the Ozempic pen.  Can he have someone help him get financial assistance to get this.  Please call.

## 2020-04-13 NOTE — Telephone Encounter (Signed)
Appt made

## 2020-04-14 ENCOUNTER — Telehealth: Payer: Self-pay

## 2020-04-14 DIAGNOSIS — I152 Hypertension secondary to endocrine disorders: Secondary | ICD-10-CM

## 2020-04-14 NOTE — Telephone Encounter (Signed)
Patient aware that Amlodipine was added to medications due to elevated BP.  Patient verbalized understanding.

## 2020-04-18 ENCOUNTER — Ambulatory Visit: Payer: Medicare HMO | Admitting: Pharmacist

## 2020-04-20 ENCOUNTER — Other Ambulatory Visit: Payer: Self-pay

## 2020-04-20 ENCOUNTER — Ambulatory Visit (INDEPENDENT_AMBULATORY_CARE_PROVIDER_SITE_OTHER): Payer: Medicare HMO | Admitting: Pharmacist

## 2020-04-20 DIAGNOSIS — E119 Type 2 diabetes mellitus without complications: Secondary | ICD-10-CM

## 2020-04-20 NOTE — Progress Notes (Signed)
    04/20/2020 Name: Alex [name] MRN: [id number] DOB: 05/21/1961   S:  29 yoM Presents for diabetes evaluation, education, and management Patient was referred and last seen by Primary Care Provider on 04/06/20.  Insurance coverage/medication affordability: [employer] medicare  Patient reports adherence with medications. . Current diabetes medications include: ozempic new start . Current hypertension medications include: amlodipine, lisinopril Goal 130/80 . Current hyperlipidemia medications include: n/a, need to address   The patient is asked to make an attempt to improve diet and exercise patterns to aid in medical management of this problem.  Patient-reported exercise habits: n/a  O:  Lab Results  Component Value Date   HGBA1C 5.9 04/06/2020    Lipid Panel     Component Value Date/Time   CHOL 171 11/26/2019 0941   TRIG 131 11/26/2019 0941   HDL 47 11/26/2019 0941   CHOLHDL 3.6 11/26/2019 0941   LDLCALC 101 (H) 11/26/2019 0941     Home fasting blood sugars: n/a  2 hour post-meal/random blood sugars: 139 today after lunch (checked in office).   A/P:  Diabetes T2DM, new start ozempic for DM/weight loss.  Patient does not check his blood sugar.  -Continue ozempic 0.5mg  sq weekly  Will apply for patient assistance through [employer]  -Continue metformin for now  -Note to add statin at next PCP visit in December 2021  -Encouraged patient to check BGs for [place] to be able to make changes to his regimen  -Extensively discussed pathophysiology of diabetes, recommended lifestyle interventions, dietary effects on blood sugar control  -Counseled on s/sx of and management of hypoglycemia  -Next A1C anticipated 3 months.   Written patient instructions provided.  Total time in face to face counseling 30 minutes.   Follow up PCP Clinic Visit in 2months.   Kieth Brightly, PharmD, BCPS Clinical Pharmacist, [facility]  II Phone  [phone]

## 2020-04-22 ENCOUNTER — Other Ambulatory Visit: Payer: Self-pay | Admitting: Family Medicine

## 2020-04-22 DIAGNOSIS — L219 Seborrheic dermatitis, unspecified: Secondary | ICD-10-CM

## 2020-05-09 ENCOUNTER — Telehealth: Payer: Self-pay

## 2020-05-09 NOTE — Telephone Encounter (Signed)
Call returned to patient  He is having nausea with ozempic Counseled patient to decrease portion size, eat less fatty/carb-rich meals, denied RX for zofran Will leave sample in the refrigerator for patient to pick up

## 2020-05-20 ENCOUNTER — Telehealth: Payer: Self-pay

## 2020-05-23 NOTE — Telephone Encounter (Signed)
Attempted to contact patient - NVM 

## 2020-05-23 NOTE — Telephone Encounter (Signed)
Please let patient know he was approved for [employer] Patient assistance program for free ozempic (program will end on 07/08/20 and we will have to reapply)  His medication will ship to our office in 10-14 business days  If he needs a sample to bridge him to supply please pull and give  Thank you so much! Raynelle Fanning

## 2020-05-23 NOTE — Telephone Encounter (Signed)
Attemp

## 2020-05-23 NOTE — Telephone Encounter (Signed)
Pt returned missed call. Explained to pt that per Raynelle Fanning, he was approved for the financial assistance program to get his Ozempic Rx for free and that his medicine will ship to our office within 10-14 business days and we will call him when the shipment gets here. Also made pt aware that if he needs samples before his meds arrive, to call and let us know. Pt voiced understanding.

## 2020-05-24 ENCOUNTER — Ambulatory Visit (INDEPENDENT_AMBULATORY_CARE_PROVIDER_SITE_OTHER): Payer: Medicare HMO | Admitting: Pharmacist

## 2020-05-24 DIAGNOSIS — E119 Type 2 diabetes mellitus without complications: Secondary | ICD-10-CM

## 2020-05-24 NOTE — Progress Notes (Signed)
° ° °  05/24/2020 Name: Nehan Flaum MRN: [id number] DOB: [birth year]   S:  76 yoM Presents for diabetes evaluation, education, and management Patient was referred and last seen by Primary Care Provider on 04/06/20.  Patient visit virtually in the context of Covid-19 pandemic.   I connected with Alease Medina on 05/24/20 at 2:45pm by telephone/video and verified that I am speaking with the correct person using two identifiers.   I discussed the limitations, risks, security and privacy concerns of performing an evaluation and management service by video and the availability of in person appointments. I also discussed with the patient that there may be a patient responsible charge related to this service. The patient expressed understanding and agreed to proceed.   Patient location:  home My Location:  PCP office Persons on the video call:  2  Insurance coverage/medication affordability: [employer] medicare  Patient reports adherence with medications.  Current diabetes medications include: ozempic, metformin  Current hypertension medications include: amlodipine, lisinopril Goal 130/80  Current hyperlipidemia medications include: n/a    O:  Lab Results  Component Value Date   HGBA1C 5.9 04/06/2020    There were no vitals filed for this visit.     Lipid Panel     Component Value Date/Time   CHOL 171 11/26/2019 0941   TRIG 131 11/26/2019 0941   HDL 47 11/26/2019 0941   CHOLHDL 3.6 11/26/2019 0941   LDLCALC 101 (H) 11/26/2019 0941    Home fasting blood sugars: n/a  2 hour post-meal/random blood sugars: n/a.   A/P:  Diabetes T2DM currently controlled, however patient would like to lose weight, thus GLP1 started.  Patient is adherent with medication.  -Continue Ozempic 0.5mg  sq weekly --> patient was approved for [employer] patient assistance program; medicine to ship to PCP office in 10-14 business days.  -Continue metformin  -Patient interested in Libre--will send  paperwork to [facility]  -Extensively discussed pathophysiology of diabetes, recommended lifestyle interventions, dietary effects on blood sugar control  -Counseled on s/sx of and management of hypoglycemia  -Next A1C anticipated December 2021.  Written patient instructions provided.  Total time in counseling 15 minutes.   Follow up PCP Clinic Visit ON 06/2020.    Kieth Brightly, PharmD, BCPS Clinical Pharmacist, [facility]  II Phone [phone]

## 2020-06-08 ENCOUNTER — Emergency Department (HOSPITAL_COMMUNITY)
Admission: EM | Admit: 2020-06-08 | Discharge: 2020-06-09 | Disposition: A | Discharge status: Home / Self Care | Payer: Medicare HMO | Attending: Emergency Medicine | Admitting: Emergency Medicine

## 2020-06-08 ENCOUNTER — Other Ambulatory Visit: Payer: Self-pay

## 2020-06-08 ENCOUNTER — Encounter (HOSPITAL_COMMUNITY): Payer: Self-pay | Admitting: Emergency Medicine

## 2020-06-08 ENCOUNTER — Telehealth: Payer: Self-pay | Admitting: Pharmacist

## 2020-06-08 DIAGNOSIS — E119 Type 2 diabetes mellitus without complications: Secondary | ICD-10-CM | POA: Insufficient documentation

## 2020-06-08 DIAGNOSIS — K029 Dental caries, unspecified: Secondary | ICD-10-CM | POA: Insufficient documentation

## 2020-06-08 DIAGNOSIS — F17220 Nicotine dependence, chewing tobacco, uncomplicated: Secondary | ICD-10-CM | POA: Diagnosis not present

## 2020-06-08 DIAGNOSIS — Z7984 Long term (current) use of oral hypoglycemic drugs: Secondary | ICD-10-CM | POA: Insufficient documentation

## 2020-06-08 DIAGNOSIS — R52 Pain, unspecified: Secondary | ICD-10-CM | POA: Diagnosis not present

## 2020-06-08 DIAGNOSIS — Z96651 Presence of right artificial knee joint: Secondary | ICD-10-CM | POA: Insufficient documentation

## 2020-06-08 DIAGNOSIS — I10 Essential (primary) hypertension: Secondary | ICD-10-CM | POA: Insufficient documentation

## 2020-06-08 DIAGNOSIS — M25561 Pain in right knee: Secondary | ICD-10-CM | POA: Diagnosis not present

## 2020-06-08 DIAGNOSIS — Z79899 Other long term (current) drug therapy: Secondary | ICD-10-CM | POA: Insufficient documentation

## 2020-06-08 DIAGNOSIS — K0889 Other specified disorders of teeth and supporting structures: Secondary | ICD-10-CM | POA: Diagnosis not present

## 2020-06-08 DIAGNOSIS — R5381 Other malaise: Secondary | ICD-10-CM | POA: Diagnosis not present

## 2020-06-08 NOTE — ED Triage Notes (Signed)
Pt c/o left side jaw pain with a cracked tooth. Pt has an appt tomorrow with a PCP. Pt also c/o rt knee pain.

## 2020-06-08 NOTE — Telephone Encounter (Signed)
Patient enrolled in [employer] patient assistance program until 2022 Ozempic 0.5mg  #2 boxes/2 month supply is labeled in fridge for patient to pick up Patient called and made aware We will have to re-enroll him for next year.

## 2020-06-09 ENCOUNTER — Other Ambulatory Visit: Payer: Self-pay

## 2020-06-09 ENCOUNTER — Ambulatory Visit (INDEPENDENT_AMBULATORY_CARE_PROVIDER_SITE_OTHER): Payer: Medicare HMO | Admitting: Nurse Practitioner

## 2020-06-09 DIAGNOSIS — I83813 Varicose veins of bilateral lower extremities with pain: Secondary | ICD-10-CM

## 2020-06-09 DIAGNOSIS — K047 Periapical abscess without sinus: Secondary | ICD-10-CM | POA: Diagnosis not present

## 2020-06-09 DIAGNOSIS — K029 Dental caries, unspecified: Secondary | ICD-10-CM | POA: Diagnosis not present

## 2020-06-09 DIAGNOSIS — I10 Essential (primary) hypertension: Secondary | ICD-10-CM | POA: Diagnosis not present

## 2020-06-09 DIAGNOSIS — K0889 Other specified disorders of teeth and supporting structures: Secondary | ICD-10-CM | POA: Diagnosis not present

## 2020-06-09 MED ORDER — HYDROCODONE-ACETAMINOPHEN 5-325 MG PO TABS
1.0000 | ORAL_TABLET | ORAL | 0 refills | Status: DC | PRN
Start: 2020-06-09 — End: 2020-06-23

## 2020-06-09 MED ORDER — AMOXICILLIN 875 MG PO TABS: 875.0000 mg | ORAL_TABLET | Freq: Two times a day (BID) | ORAL | 0 refills | Status: DC

## 2020-06-09 MED ORDER — AMOXICILLIN 500 MG PO CAPS: 1000.0000 mg | ORAL_CAPSULE | Freq: Two times a day (BID) | ORAL | 0 refills | Status: DC

## 2020-06-09 MED ORDER — AMOXICILLIN 250 MG PO CAPS
1000.0000 mg | ORAL_CAPSULE | Freq: Once | ORAL | Status: AC
  Administered 2020-06-09: 1000 mg via ORAL
  Filled 2020-06-09: qty 4

## 2020-06-09 MED ORDER — HYDROCODONE-ACETAMINOPHEN 5-325 MG PO TABS
1.0000 | ORAL_TABLET | Freq: Once | ORAL | Status: AC
  Administered 2020-06-09: 1 via ORAL
  Filled 2020-06-09: qty 1

## 2020-06-09 NOTE — Discharge Instructions (Addendum)
You need to see a dentist as soon as possible. Your tooth will not get better until it is fixed by a dentist.  Your blood pressure was very high today. Please have it rechecked several times over the next week. If it is staying high, you will need to have your blood pressure medication adjusted.

## 2020-06-09 NOTE — ED Provider Notes (Signed)
[facility] EMERGENCY DEPARTMENT Provider Note   CSN: [id number] Arrival date & time: 06/08/20  1955   History Chief Complaint  Patient presents with  . Dental Pain    Alex [name] is a 59 y.o. male.  The history is provided by the patient.  Dental Pain He states that about 3 weeks ago, a left lower tooth broke and has been very painful.  He tried seeing a Education officer, community in [place], but states that he did not feel comfortable with the dentist working on his mouth.  He is supposed to see a dentist in [place] in the next 1-2 days.  In the meantime, he is complaining of severe pain in the left lower jaw.  Pain is rated at 8/10.  He denies fever or chills.  As a separate complaint, he has had a right knee replacement and is complaining of pain in the right knee.  Past Medical History:  Diagnosis Date  . Diabetes mellitus without complication (HCC)   . History of kidney stones    removed 5 months ago  . Hypertension   . PONV (postoperative nausea and vomiting)    AGITATION after anesthesia as well  . Sleep apnea    NO CPAP    Patient Active Problem List   Diagnosis Date Noted  . Chronic neck pain 08/10/2019  . Status post revision of total replacement of right knee 04/10/2019  . Loose right total knee arthroplasty (HCC) 03/09/2019  . Pain in right knee 02/17/2019  . Cervical spine degeneration 12/30/2018  . Right wrist pain 12/19/2018  . Right arm numbness 12/19/2018  . S/P cervical spinal fusion 03/27/2018  . Morbid obesity (HCC) 02/21/2018  . Diabetes mellitus type 2 in obese (HCC) 03/28/2017  . Hypertension associated with diabetes (HCC) 08/25/2016    Past Surgical History:  Procedure Laterality Date  . ANTERIOR CERVICAL DECOMP/DISCECTOMY FUSION N/A 03/27/2018   Procedure: ANTERIOR CERVICAL DECOMPRESSION/DISCECTOMY FUSION C3-5;  Surgeon: Venita Lick, MD;  Location: [facility] OR;  Service: Orthopedics;  Laterality: N/A;  3.5 hrs  . arm surgery Right    x2 involving tendons/ulnar  nerve  . BACK SURGERY    . CARPAL TUNNEL RELEASE Right   . clavical surgery    . EYE SURGERY Bilateral    glaucoma  . HERNIA REPAIR    . JOINT REPLACEMENT Right 06/2011  . TOTAL KNEE REVISION Right 04/10/2019   Procedure: RIGHT TOTAL KNEE REVISION;  Surgeon: Kathryne Hitch, MD;  Location: [facility] ORS;  Service: Orthopedics;  Laterality: Right;  . TRICEPS TENDON REPAIR         Family History  Problem Relation Age of Onset  . Cancer Mother   . Diabetes Father   . Hypertension Father   . Heart attack Father     Social History   Tobacco Use  . Smoking status: Never Smoker  . Smokeless tobacco: Current User    Types: Chew  Vaping Use  . Vaping Use: Never used  Substance Use Topics  . Alcohol use: No  . Drug use: No    Home Medications Prior to Admission medications   Medication Sig Start Date End Date Taking? Authorizing Provider  amLODipine (NORVASC) 5 MG tablet Take 1 tablet (5 mg total) by mouth daily. 04/06/20   Dettinger, Elige Radon, MD  diclofenac Sodium (VOLTAREN) 1 % GEL Apply 2 g topically 4 (four) times daily. 07/23/19   Dettinger, Elige Radon, MD  indomethacin (INDOCIN) 50 MG capsule Take 1 capsule (50 mg total) by  mouth 2 (two) times daily with a meal. 01/13/20   Stacks, Broadus John, MD  ketoconazole (NIZORAL) 2 % shampoo SHAMPOO, LATHER, LEAVE FOR 10 MINUTES, THEN RINSE. USE 2 TIMES A WEEK FOR UP TO 4 WEEKS 04/25/20   Dettinger, Elige Radon, MD  lisinopril (ZESTRIL) 40 MG tablet Take 1 tablet (40 mg total) by mouth daily. 11/26/19   Dettinger, Elige Radon, MD  metFORMIN (GLUCOPHAGE) 500 MG tablet Take 1 tablet (500 mg total) by mouth 2 (two) times daily with a meal. TAKE 2 TABLET BY MOUTH TWICE A DAY 04/06/20   Dettinger, Elige Radon, MD  methocarbamol (ROBAXIN) 500 MG tablet Take 1 tablet (500 mg total) by mouth every 8 (eight) hours as needed for muscle spasms. 08/10/19   Marcello Fennel, MD  oxymetazoline (AFRIN) 0.05 % nasal spray Place 1 spray into both nostrils every 6 (six)  hours as needed for congestion.     [provider]  pregabalin (LYRICA) 75 MG capsule Take 1 capsule (75 mg total) by mouth 2 (two) times daily as needed (for neuropathy pain). 04/02/19   Dettinger, Elige Radon, MD  Semaglutide,0.25 or 0.5MG /DOS, (OZEMPIC, 0.25 OR 0.5 MG/DOSE,) 2 MG/1.5ML SOPN Inject 0.5 mg into the skin once a week. 04/06/20   Dettinger, Elige Radon, MD    Allergies    Morphine and related, Buprenorphine hcl-naloxone hcl, and Codeine  Review of Systems   Review of Systems  All other systems reviewed and are negative.   Physical Exam Updated Vital Signs BP (!) 196/131 (BP Location: Right Arm)   Pulse 94   Temp 98.5 F (36.9 C) (Oral)   Resp (!) 22   Ht 5\' 6"  (1.676 m)   Wt 101.6 kg   SpO2 99%   BMI 36.15 kg/m   Physical Exam Vitals and nursing note reviewed.   59 year old male, resting comfortably and in no acute distress. Vital signs are significant for elevated blood pressure. Oxygen saturation is 99%, which is normal. Head is normocephalic and atraumatic. PERRLA, EOMI. examination of the oral cavity shows multiple carious teeth.  Tooth #19 as deep caries and is tender to percussion.  There is no gingival swelling, erythema, pallor. Neck is nontender and supple without adenopathy or JVD. Back is nontender and there is no CVA tenderness. Lungs are clear without rales, wheezes, or rhonchi. Chest is nontender. Heart has regular rate and rhythm without murmur. Abdomen is soft, flat, nontender without masses or hepatosplenomegaly and peristalsis is normoactive. Extremities have no cyanosis or edema, full range of motion is present.  There is pain with passive range of motion of the right knee, but no swelling or deformity. Skin is warm and dry without rash. Neurologic: Mental status is normal, cranial nerves are intact, there are no motor or sensory deficits.  ED Results / Procedures / Treatments    Procedures Procedures   Medications Ordered in  ED Medications  amoxicillin (AMOXIL) capsule 1,000 mg (1,000 mg Oral Given 06/09/20 0033)  HYDROcodone-acetaminophen (NORCO/VICODIN) 5-325 MG per tablet 1 tablet (1 tablet Oral Given 06/09/20 0033)    ED Course  I have reviewed the triage vital signs and the nursing notes.  MDM Rules/Calculators/A&P Dental pain secondary to caries.  Right knee pain status post right knee arthroplasty.  Elevated blood pressure.  He is given a prescription for amoxicillin and a small number of hydrocodone-acetaminophen tablets and is advised to have a dentist provide definitive care of his tooth as soon as possible.  Recommended recheck blood  pressure as an outpatient and follow-up with PCP to consider adjustment of medication if severe elevation of blood pressure persists.  He is referred back to his orthopedic surgeon for his knee pain.  Old records are reviewed, and it is noted that he is already seeing Dr. Magnus Ivan in [place] regarding his knee and he is referred back to him for follow-up.  Final Clinical Impression(s) / ED Diagnoses Final diagnoses:  Pain due to dental caries  Pain in joint of right knee  Elevated blood pressure reading with diagnosis of hypertension    Rx / DC Orders ED Discharge Orders         Ordered    HYDROcodone-acetaminophen (NORCO) 5-325 MG tablet  Every 4 hours PRN        06/09/20 0024    HYDROcodone-acetaminophen (NORCO) 5-325 MG tablet  Every 4 hours PRN        06/09/20 0024    amoxicillin (AMOXIL) 500 MG capsule  2 times daily        06/09/20 0024           Dione Booze, MD 06/09/20 [phone]

## 2020-06-09 NOTE — Progress Notes (Signed)
Virtual Visit via telephone Note Due to COVID-19 pandemic this visit was conducted virtually. This visit type was conducted due to national recommendations for restrictions regarding the COVID-19 Pandemic (e.g. social distancing, sheltering in place) in an effort to limit this patient's exposure and mitigate transmission in our community. All issues noted in this document were discussed and addressed.  A physical exam was not performed with this format.  I connected with Alex [name] on 06/09/20 at 9:00 by telephone and verified that I am speaking with the correct person using two identifiers. Alex [name] is currently located at home and no one is currently with  him during visit. The provider, Mary-Margaret Daphine Deutscher, FNP is located in their office at time of visit.  I discussed the limitations, risks, security and privacy concerns of performing an evaluation and management service by telephone and the availability of in person appointments. I also discussed with the patient that there may be a patient responsible charge related to this service. The patient expressed understanding and agreed to proceed.   History and Present Illness:   Chief Complaint: Dental Pain   HPI Patient calls in stating that he broke tooth off. He went to ER last night and they gave him amoxicillin 2000mg  at one time. He says that he has a total knee replacement and he does not want knee to get infected. He is trying to get a dentist appointment.    Review of Systems  Constitutional: Negative for diaphoresis and weight loss.  Eyes: Negative for blurred vision, double vision and pain.  Respiratory: Negative for shortness of breath.   Cardiovascular: Negative for chest pain, palpitations, orthopnea and leg swelling.  Gastrointestinal: Negative for abdominal pain.  Skin: Negative for rash.  Neurological: Negative for dizziness, sensory change, loss of consciousness, weakness and headaches.  Endo/Heme/Allergies:  Negative for polydipsia. Does not bruise/bleed easily.  Psychiatric/Behavioral: Negative for memory loss. The patient does not have insomnia.   All other systems reviewed and are negative.    Observations/Objective: Alert and oriented- answers all questions appropriately No distress Left lower jaw swollen. Throbbing pain in area   Assessment and Plan: in today with chief complaint of Dental Pain   1. Tooth abscess Meds ordered this encounter  Medications  . amoxicillin (AMOXIL) 875 MG tablet    Sig: Take 1 tablet (875 mg total) by mouth 2 (two) times daily. 1 po BID    Dispense:  20 tablet    Refill:  0    Order Specific Question:   Supervising Provider    Answer:   Alex [name] Nils Pyle    Gargle with salt water See dentist as soon as possible     Follow Up Instructions: prn    I discussed the assessment and treatment plan with the patient. The patient was provided an opportunity to ask questions and all were answered. The patient agreed with the plan and demonstrated an understanding of the instructions.   The patient was advised to call back or seek an in-person evaluation if the symptoms worsen or if the condition fails to improve as anticipated.  The above assessment and management plan was discussed with the patient. The patient verbalized understanding of and has agreed to the management plan. Patient is aware to call the clinic if symptoms persist or worsen. Patient is aware when to return to the clinic for a follow-up visit. Patient educated on when it is appropriate to go to the emergency department.   Time call  ended:  9:14  I provided 14 minutes of non-face-to-face time during this encounter.    Mary-Margaret Daphine Deutscher, FNP

## 2020-06-10 MED FILL — Hydrocodone-Acetaminophen Tab 5-325 MG: ORAL | Qty: 6 | Status: AC

## 2020-06-15 ENCOUNTER — Ambulatory Visit: Payer: Medicare HMO | Admitting: Vascular Surgery

## 2020-06-15 ENCOUNTER — Inpatient Hospital Stay (HOSPITAL_COMMUNITY): Admission: RE | Admit: 2020-06-15 | Payer: Medicare HMO | Source: Ambulatory Visit

## 2020-06-20 ENCOUNTER — Other Ambulatory Visit: Payer: Self-pay | Admitting: Family Medicine

## 2020-06-20 DIAGNOSIS — L219 Seborrheic dermatitis, unspecified: Secondary | ICD-10-CM

## 2020-06-23 ENCOUNTER — Encounter: Payer: Self-pay | Admitting: Family Medicine

## 2020-06-23 ENCOUNTER — Ambulatory Visit (INDEPENDENT_AMBULATORY_CARE_PROVIDER_SITE_OTHER): Payer: Medicare HMO | Admitting: Family Medicine

## 2020-06-23 ENCOUNTER — Other Ambulatory Visit: Payer: Self-pay

## 2020-06-23 VITALS — BP 146/90 | HR 100 | Ht 66.0 in | Wt 232.0 lb

## 2020-06-23 DIAGNOSIS — E785 Hyperlipidemia, unspecified: Secondary | ICD-10-CM | POA: Diagnosis not present

## 2020-06-23 DIAGNOSIS — I152 Hypertension secondary to endocrine disorders: Secondary | ICD-10-CM | POA: Diagnosis not present

## 2020-06-23 DIAGNOSIS — E1159 Type 2 diabetes mellitus with other circulatory complications: Secondary | ICD-10-CM

## 2020-06-23 DIAGNOSIS — E669 Obesity, unspecified: Secondary | ICD-10-CM

## 2020-06-23 DIAGNOSIS — E1169 Type 2 diabetes mellitus with other specified complication: Secondary | ICD-10-CM | POA: Diagnosis not present

## 2020-06-23 DIAGNOSIS — E1142 Type 2 diabetes mellitus with diabetic polyneuropathy: Secondary | ICD-10-CM | POA: Diagnosis not present

## 2020-06-23 LAB — CBC WITH DIFFERENTIAL/PLATELET
Basophils Absolute: 0 10*3/uL (ref 0.0–0.2)
Basos: 0 %
EOS (ABSOLUTE): 0.1 10*3/uL (ref 0.0–0.4)
Eos: 2 %
Hematocrit: 41 % (ref 37.5–51.0)
Hemoglobin: 13.8 g/dL (ref 13.0–17.7)
Immature Grans (Abs): 0 10*3/uL (ref 0.0–0.1)
Immature Granulocytes: 0 %
Lymphocytes Absolute: 2.5 10*3/uL (ref 0.7–3.1)
Lymphs: 31 %
MCH: 28.6 pg (ref 26.6–33.0)
MCHC: 33.7 g/dL (ref 31.5–35.7)
MCV: 85 fL (ref 79–97)
Monocytes Absolute: 0.6 10*3/uL (ref 0.1–0.9)
Monocytes: 8 %
Neutrophils Absolute: 4.8 10*3/uL (ref 1.4–7.0)
Neutrophils: 59 %
Platelets: 244 10*3/uL (ref 150–450)
RBC: 4.83 x10E6/uL (ref 4.14–5.80)
RDW: 13.5 % (ref 11.6–15.4)
WBC: 8.1 10*3/uL (ref 3.4–10.8)

## 2020-06-23 LAB — LIPID PANEL
Chol/HDL Ratio: 3.1 ratio (ref 0.0–5.0)
Cholesterol, Total: 152 mg/dL (ref 100–199)
HDL: 49 mg/dL (ref >39)
LDL Chol Calc (NIH): 81 mg/dL (ref 0–99)
Triglycerides: 121 mg/dL (ref 0–149)
VLDL Cholesterol Cal: 22 mg/dL (ref 5–40)

## 2020-06-23 LAB — CMP14+EGFR
ALT: 46 IU/L — ABNORMAL HIGH (ref 0–44)
AST: 26 IU/L (ref 0–40)
Albumin/Globulin Ratio: 2.3 — ABNORMAL HIGH (ref 1.2–2.2)
Albumin: 4.4 g/dL (ref 3.8–4.9)
Alkaline Phosphatase: 92 IU/L (ref 44–121)
BUN/Creatinine Ratio: 14 (ref 9–20)
BUN: 12 mg/dL (ref 6–24)
Bilirubin Total: 0.4 mg/dL (ref 0.0–1.2)
CO2: 21 mmol/L (ref 20–29)
Calcium: 9.4 mg/dL (ref 8.7–10.2)
Chloride: 105 mmol/L (ref 96–106)
Creatinine, Ser: 0.84 mg/dL (ref 0.76–1.27)
GFR calc Af Amer: 111 mL/min/{1.73_m2} (ref >59)
GFR calc non Af Amer: 96 mL/min/{1.73_m2} (ref >59)
Globulin, Total: 1.9 g/dL (ref 1.5–4.5)
Glucose: 132 mg/dL — ABNORMAL HIGH (ref 65–99)
Potassium: 4.2 mmol/L (ref 3.5–5.2)
Sodium: 141 mmol/L (ref 134–144)
Total Protein: 6.3 g/dL (ref 6.0–8.5)

## 2020-06-23 LAB — BAYER DCA HB A1C WAIVED: HB A1C (BAYER DCA - WAIVED): 6 % (ref <7.0)

## 2020-06-23 MED ORDER — ROSUVASTATIN CALCIUM 10 MG PO TABS: 10.0000 mg | ORAL_TABLET | Freq: Every day | ORAL | 3 refills | Status: DC

## 2020-06-23 MED ORDER — AMLODIPINE BESYLATE 5 MG PO TABS: 5.0000 mg | ORAL_TABLET | Freq: Every day | ORAL | 3 refills | Status: DC

## 2020-06-23 NOTE — Progress Notes (Signed)
BP (!) 146/90   Pulse 100   Ht _0  (1.676 m)   Wt 232 lb (105.2 kg)   SpO2 98%   BMI 37.45 kg/m    Subjective:   Patient ID: Alex [name], male    DOB: [birth year], 59 y.o.   MRN: [id number]  HPI: Alex [name] is a 59 y.o. male presenting on 06/23/2020 for Medical Management of Chronic Issues, Diabetes, and Hypertension   HPI Type 2 diabetes mellitus Patient comes in today for recheck of his diabetes. Patient has been currently taking Metformin and Ozempic. Patient is currently on an ACE inhibitor/ARB. Patient has seen an ophthalmologist this year. Patient denies any issues with their feet. The symptom started onset as an adult hypertension and hyperlipidemia and neuropathy ARE RELATED TO DM   Hypertension Patient is currently on lisinopril, and their blood pressure today is 146/90. Patient denies any lightheadedness or dizziness. Patient denies headaches, blurred vision, chest pains, shortness of breath, or weakness. Denies any side effects from medication and is content with current medication.   Hyperlipidemia Patient is coming in for recheck of his hyperlipidemia. The patient is currently taking no medication currently but will start Crestor. They deny any issues with myalgias or history of liver damage from it. They deny any focal numbness or weakness or chest pain.   Patient's diabetes and hypertension and cholesterol are more complicated by the patient's morbid obesity.  Discussed weight loss and lifestyle modification and exercise with the patient.   Relevant past medical, surgical, family and social history reviewed and updated as indicated. Interim medical history since our last visit reviewed. Allergies and medications reviewed and updated.  Review of Systems  Constitutional: Negative for chills and fever.  Eyes: Negative for visual disturbance.  Respiratory: Negative for shortness of breath and wheezing.   Cardiovascular: Negative for chest pain and leg swelling.   Musculoskeletal: Positive for arthralgias. Negative for back pain and gait problem.  Skin: Negative for rash.  Neurological: Negative for dizziness, weakness and light-headedness.  All other systems reviewed and are negative.   Per HPI unless specifically indicated above   Allergies as of 06/23/2020      Reactions   Morphine And Related Nausea Only   Buprenorphine Hcl-naloxone Hcl Nausea Only   Codeine Nausea Only, Other (See Comments)   per pt, "felt weird in my head"      Medication List       Accurate as of June 23, 2020  9:58 AM. If you have any questions, ask your nurse or doctor.        STOP taking these medications   amoxicillin 875 MG tablet Commonly known as: AMOXIL Stopped by: Worthy Rancher, MD   HYDROcodone-acetaminophen 5-325 MG tablet Commonly known as: Norco Stopped by: Worthy Rancher, MD     TAKE these medications   amLODipine 5 MG tablet Commonly known as: NORVASC Take 1 tablet (5 mg total) by mouth daily.   diclofenac Sodium 1 % Gel Commonly known as: Voltaren Apply 2 g topically 4 (four) times daily.   indomethacin 50 MG capsule Commonly known as: INDOCIN Take 1 capsule (50 mg total) by mouth 2 (two) times daily with a meal.   ketoconazole 2 % shampoo Commonly known as: NIZORAL SHAMPOO, LATHER, LEAVE FOR 10 MINUTES, THEN RINSE. USE 2 TIMES A WEEK FOR UP TO 4 WEEKS   lisinopril 40 MG tablet Commonly known as: ZESTRIL Take 1 tablet (40 mg total) by mouth daily.   metFORMIN  500 MG tablet Commonly known as: GLUCOPHAGE Take 1 tablet (500 mg total) by mouth 2 (two) times daily with a meal. TAKE 2 TABLET BY MOUTH TWICE A DAY   methocarbamol 500 MG tablet Commonly known as: Robaxin Take 1 tablet (500 mg total) by mouth every 8 (eight) hours as needed for muscle spasms.   oxymetazoline 0.05 % nasal spray Commonly known as: AFRIN Place 1 spray into both nostrils every 6 (six) hours as needed for congestion.   Ozempic (0.25 or  0.5 MG/DOSE) 2 MG/1.5ML Sopn Generic drug: Semaglutide(0.25 or 0.5MG /DOS) Inject 0.5 mg into the skin once a week.   pregabalin 75 MG capsule Commonly known as: LYRICA Take 1 capsule (75 mg total) by mouth 2 (two) times daily as needed (for neuropathy pain).   rosuvastatin 10 MG tablet Commonly known as: Crestor Take 1 tablet (10 mg total) by mouth daily. Started by: Fransisca Kaufmann Melis Trochez, MD        Objective:   BP (!) 146/90   Pulse 100   Ht 5\' 6"  (1.676 m)   Wt 232 lb (105.2 kg)   SpO2 98%   BMI 37.45 kg/m   Wt Readings from Last 3 Encounters:  06/23/20 232 lb (105.2 kg)  06/08/20 224 lb (101.6 kg)  04/06/20 238 lb (108 kg)    Physical Exam Vitals and nursing note reviewed.  Constitutional:      General: He is not in acute distress.    Appearance: He is well-developed and well-nourished. He is not diaphoretic.  Eyes:     General: No scleral icterus.    Extraocular Movements: EOM normal.     Conjunctiva/sclera: Conjunctivae normal.  Neck:     Thyroid: No thyromegaly.  Cardiovascular:     Rate and Rhythm: Normal rate and regular rhythm.     Pulses: Intact distal pulses.     Heart sounds: Normal heart sounds. No murmur heard.   Pulmonary:     Effort: Pulmonary effort is normal. No respiratory distress.     Breath sounds: Normal breath sounds. No wheezing.  Musculoskeletal:        General: No edema. Normal range of motion.     Cervical back: Neck supple.  Lymphadenopathy:     Cervical: No cervical adenopathy.  Skin:    General: Skin is warm and dry.     Findings: No rash.  Neurological:     Mental Status: He is alert and oriented to person, place, and time.     Coordination: Coordination normal.  Psychiatric:        Mood and Affect: Mood and affect normal.        Behavior: Behavior normal.       Assessment & Plan:   Problem List Items Addressed This Visit      Cardiovascular and Mediastinum   Hypertension associated with diabetes ([place])   Relevant  Medications   rosuvastatin (CRESTOR) 10 MG tablet   amLODipine (NORVASC) 5 MG tablet     Endocrine   Diabetes mellitus type 2 in obese (HCC)   Relevant Medications   rosuvastatin (CRESTOR) 10 MG tablet   Hyperlipidemia associated with type 2 diabetes mellitus (HCC)   Relevant Medications   rosuvastatin (CRESTOR) 10 MG tablet   Other Relevant Orders   Lipid panel     Other   Morbid obesity ([place])   Relevant Orders   CMP14+EGFR    Other Visit Diagnoses    Type 2 diabetes mellitus with diabetic polyneuropathy, without long-term current  use of insulin (HCC)    -  Primary   Relevant Medications   rosuvastatin (CRESTOR) 10 MG tablet   Other Relevant Orders   Bayer DCA Hb A1c Waived   CBC with Differential/Platelet    a1c 6.0, no change in diabetes med Will add back amlodipine. Will start on Crestor  Follow up plan: Return in about 3 months (around 09/21/2020), or if symptoms worsen or fail to improve, for Diabetes and hypertension and cholesterol.  Counseling provided for all of the vaccine components Orders Placed This Encounter  Procedures  . Bayer DCA Hb A1c Waived  . CBC with Differential/Platelet  . CMP14+EGFR  . Lipid panel    Caryl Pina, MD [facility] 06/23/2020, 9:58 AM

## 2020-06-28 ENCOUNTER — Ambulatory Visit: Payer: Medicare HMO | Admitting: Pharmacist

## 2020-07-14 ENCOUNTER — Telehealth: Payer: Self-pay | Admitting: Family Medicine

## 2020-07-14 NOTE — Telephone Encounter (Signed)
Appt made with Julie

## 2020-07-14 NOTE — Telephone Encounter (Signed)
Please let patient know:  Patient needs appt with me (30 min pharm clinic) and to bring most recent financials. Please let him know he may have to get Ozempic filled at local pharmacy until re-enrolled in program.  Patient assistance medications will not ship until February of this year.  He has refills and [employer] for ozemipc on file We are out of samples

## 2020-07-14 NOTE — Telephone Encounter (Signed)
Pt is calling to see if he needs to fill out paperwork for his ozempic or if he needs another appt with Raynelle Fanning to continue getting the medication

## 2020-07-18 ENCOUNTER — Telehealth: Payer: Self-pay

## 2020-07-18 NOTE — Telephone Encounter (Signed)
Question answered about novo nordisk patient assistance program Will see patient tomorrow at 9:30

## 2020-07-19 ENCOUNTER — Other Ambulatory Visit: Payer: Self-pay

## 2020-07-19 ENCOUNTER — Ambulatory Visit (INDEPENDENT_AMBULATORY_CARE_PROVIDER_SITE_OTHER): Payer: Medicare HMO | Admitting: Pharmacist

## 2020-07-19 DIAGNOSIS — E119 Type 2 diabetes mellitus without complications: Secondary | ICD-10-CM

## 2020-07-19 NOTE — Progress Notes (Signed)
    07/20/2020 Name: Alex [name] MRN: [id number] DOB: 26-Nov-1960   S:  59 yoM Presents for diabetes evaluation, education, and management Patient was referred and last seen by Primary Care Provider on 04/06/20.  Patient states he does feel better on Ozempic.  He is here to re-enroll in [employer] patient assistance program for [employer].  Insurance coverage/medication affordability: [employer]  Patient reports adherence with medications. . Current diabetes medications include: ozempic, metformin . Current hypertension medications include: amlodipine, lisinopril Goal 130/80 . Current hyperlipidemia medications include: crestor   Patient denies hypoglycemic events.   Patient reported dietary habits: Eats 3 meals/day Discussed meal planning options and Plate method for healthy eating . Avoid sugary drinks and desserts . Incorporate balanced protein, non starchy veggies, 1 serving of carbohydrate with each meal . Increase water intake . Increase physical activity as able  Patient-reported exercise habits: n/a  O:  Lab Results  Component Value Date   HGBA1C 6.0 06/23/2020    There were no vitals filed for this visit.     Lipid Panel     Component Value Date/Time   CHOL 152 06/23/2020 1026   TRIG 121 06/23/2020 1026   HDL 49 06/23/2020 1026   CHOLHDL 3.1 06/23/2020 1026   LDLCALC 81 06/23/2020 1026     Home fasting blood sugars: n/a  2 hour post-meal/random blood sugars: n/a.    Clinical Atherosclerotic Cardiovascular Disease (ASCVD): No   The 10-year ASCVD risk score Denman George DC Jr., et al., 2013) is: 16.8%   Values used to calculate the score:     Age: 55 years     Sex: Male     Is Non-Hispanic African American: No     Diabetic: Yes     Tobacco smoker: No     Systolic Blood Pressure: 146 mmHg     Is BP treated: Yes     HDL Cholesterol: 49 mg/dL     Total Cholesterol: 152 mg/dL    A/P:  Diabetes U7OZ currently controlled, however patient would like  to lose weight, thus GLP1 started.  Patient is adherent with medication.  -Continue Ozempic 0.5mg  sq weekly --> patient re-enrolled in Novo nordisk patient assistance program; medicine to ship to PCP office in 10-14 business days from approval; expect deliver around 08/2020; patient aware he may have to refill on insurance while he is awaiting shipment -->patient still having some GI discomfort with Ozemipc, therefore we will not increase to 1mg  sq weekly at this time -->continue to eat low carb, low fat foods to try and avoid GI distress   -Continue metformin-GFR 96  -Extensively discussed pathophysiology of diabetes, recommended lifestyle interventions, dietary effects on blood sugar control  -Counseled on s/sx of and management of hypoglycemia  -Next A1C anticipated 3/17//2022.   Written patient instructions provided.  Total time in face to face counseling 20 minutes.   Follow up PCP Clinic Visit ON 09/22/20.    09/24/20, PharmD, BCPS Clinical Pharmacist, [facility]  II Phone [phone]

## 2020-08-04 ENCOUNTER — Other Ambulatory Visit: Payer: Self-pay | Admitting: Family Medicine

## 2020-08-04 DIAGNOSIS — L219 Seborrheic dermatitis, unspecified: Secondary | ICD-10-CM

## 2020-08-17 ENCOUNTER — Telehealth: Payer: Self-pay | Admitting: *Deleted

## 2020-08-17 NOTE — Telephone Encounter (Signed)
Pt received approval letter for the pt assistance = date on it is 07/27/20.   4 month supply being sent to [facility] in 10-14 days.  He has not received it at this time.

## 2020-08-22 ENCOUNTER — Other Ambulatory Visit: Payer: Self-pay | Admitting: *Deleted

## 2020-08-22 DIAGNOSIS — I83813 Varicose veins of bilateral lower extremities with pain: Secondary | ICD-10-CM

## 2020-08-22 NOTE — Telephone Encounter (Signed)
We still have not received pt assistance med- ozempic  But, I am giving pt some samples Ozempic.  #4 pens in fridge with his name on it - he is aware and will pick up this week.

## 2020-08-23 ENCOUNTER — Encounter: Payer: Self-pay | Admitting: Orthopaedic Surgery

## 2020-08-23 ENCOUNTER — Ambulatory Visit (INDEPENDENT_AMBULATORY_CARE_PROVIDER_SITE_OTHER): Payer: Medicare HMO

## 2020-08-23 ENCOUNTER — Ambulatory Visit (INDEPENDENT_AMBULATORY_CARE_PROVIDER_SITE_OTHER): Payer: Medicare HMO | Admitting: Orthopaedic Surgery

## 2020-08-23 DIAGNOSIS — M25561 Pain in right knee: Secondary | ICD-10-CM

## 2020-08-23 MED ORDER — LIDOCAINE HCL 1 % IJ SOLN
1.0000 mL | INTRAMUSCULAR | Status: AC | PRN
  Administered 2020-08-23: 1 mL

## 2020-08-23 MED ORDER — METHYLPREDNISOLONE ACETATE 40 MG/ML IJ SUSP
40.0000 mg | INTRAMUSCULAR | Status: AC | PRN
  Administered 2020-08-23: 40 mg via INTRAMUSCULAR

## 2020-08-23 NOTE — Progress Notes (Signed)
Office Visit Note   Patient: Alex [name]           Date of Birth: 10-28-1960           MRN: [id number] Visit Date: 08/23/2020              Requested by: Alex Pyle, MD [address] [place],  [place] [address] PCP: [name], Alex Radon, MD   Assessment & Plan: Visit Diagnoses:  1. Right knee pain, unspecified chronicity     Plan: Patient tolerated injection well over the area of maximal tenderness anterior medial right knee.  He stated after the injection that he was able to ambulate without pain in the knee.  I embark on quad strengthening exercises were reviewed with him.  He will follow up with [place] as needed.  Questions were encouraged and answered by Alex [name] and myself.  Follow-Up Instructions: Return if symptoms worsen or fail to improve.   Orders:  Orders Placed This Encounter  Procedures  . Trigger Point Inj  . XR Knee 1-2 Views Right   No orders of the defined types were placed in this encounter.     Procedures: Trigger Point Inj  Date/Time: 08/23/2020 10:04 AM Performed by: Alex Bouchard, PA-C Authorized by: Alex Bouchard, PA-C   Consent Given by:  Patient Site marked: the procedure site was marked   Timeout: prior to procedure the correct patient, procedure, and site was verified   Indications:  Pain and therapeutic Total # of Trigger Points:  1 Location: lower extremity   Needle Size:  27 G Approach:  Medial Medications #1:  1 mL lidocaine 1 %; 40 mg methylPREDNISolone acetate 40 MG/ML Patient tolerance:  Patient tolerated the procedure well with no immediate complications     Clinical Data: No additional findings.   Subjective: Chief Complaint  Patient presents with  . Right Knee - Pain    HPI Alex [name] is well-known to Alex [name] service comes in today with right knee pain for about 2 months no known injury.  Patient has a history of right total knee revision by Alex [name] 04/10/2019.  He is having pain that he  describes as a cramping sensation medial aspect of the thigh but his main complaint is right knee clicking like something is tight rubbing over the anterior aspect of the knee.  He is having pain with walking.  Has tried Voltaren gel and horse liniment without any real relief. Review of Systems Negative for fevers chills.  Objective: Vital Signs: There were no vitals taken for this visit.  Physical Exam Constitutional:      Appearance: He is not ill-appearing or diaphoretic.  Pulmonary:     Effort: Pulmonary effort is normal.  Neurological:     Mental Status: He is alert and oriented to person, place, and time.  Psychiatric:        Mood and Affect: Mood normal.     Ortho Exam Right knee good range of motion without significant pain.  He has tenderness over the anterior medial aspect of the knee.  There is an area of thickening that when manipulated because of his hip pain.  There is no abnormal warmth erythema or effusion.  No instability valgus varus stressing right knee.  Surgical incisions well-healed.  Fluid range of motion of the right hip without hip pain. Specialty Comments:  No specialty comments available.  Imaging: XR Knee 1-2 Views Right  Result Date: 08/23/2020 Right knee 2 views: Films  are compared with radiographs from 10/54/2021.  There is no acute findings.  Hardware appears well seated.  No acute fractures.  Knee is well located.  No bony abnormalities.    PMFS History: Patient Active Problem List   Diagnosis Date Noted  . Hyperlipidemia associated with type 2 diabetes mellitus (HCC) 06/23/2020  . Chronic neck pain 08/10/2019  . Status post revision of total replacement of right knee 04/10/2019  . Loose right total knee arthroplasty (HCC) 03/09/2019  . Pain in right knee 02/17/2019  . Neuropathic pain 01/01/2019  . Cervical spine degeneration 12/30/2018  . Right wrist pain 12/19/2018  . Right arm numbness 12/19/2018  . Acquired trigger finger of left middle  finger 08/22/2018  . Pain of left hand 08/22/2018  . S/P cervical spinal fusion 03/27/2018  . Morbid obesity (HCC) 02/21/2018  . Pain in joint of left shoulder 02/05/2018  . Osteoarthritis of right glenohumeral joint 01/21/2018  . Pain in joint of right shoulder 12/30/2017  . Tear of right rotator cuff 12/17/2017  . Recurrent nephrolithiasis 09/18/2017  . Right flank pain 08/20/2017  . Drug dependence (HCC) 08/19/2017  . Diabetes mellitus type 2 in obese (HCC) 03/28/2017  . Hypertension associated with diabetes (HCC) 08/25/2016   Past Medical History:  Diagnosis Date  . Diabetes mellitus without complication (HCC)   . History of kidney stones    removed 5 months ago  . Hypertension   . PONV (postoperative nausea and vomiting)    AGITATION after anesthesia as well  . Sleep apnea    NO CPAP    Family History  Problem Relation Age of Onset  . Cancer Mother   . Diabetes Father   . Hypertension Father   . Heart attack Father     Past Surgical History:  Procedure Laterality Date  . ANTERIOR CERVICAL DECOMP/DISCECTOMY FUSION N/A 03/27/2018   Procedure: ANTERIOR CERVICAL DECOMPRESSION/DISCECTOMY FUSION C3-5;  Surgeon: Venita Lick, MD;  Location: [facility] OR;  Service: Orthopedics;  Laterality: N/A;  3.5 hrs  . arm surgery Right    x2 involving tendons/ulnar nerve  . BACK SURGERY    . CARPAL TUNNEL RELEASE Right   . clavical surgery    . EYE SURGERY Bilateral    glaucoma  . HERNIA REPAIR    . JOINT REPLACEMENT Right 06/2011  . TOTAL KNEE REVISION Right 04/10/2019   Procedure: RIGHT TOTAL KNEE REVISION;  Surgeon: Kathryne Hitch, MD;  Location: [facility] ORS;  Service: Orthopedics;  Laterality: Right;  . TRICEPS TENDON REPAIR     Social History   Occupational History  . Not on file  Tobacco Use  . Smoking status: Never Smoker  . Smokeless tobacco: Current User    Types: Chew  Vaping Use  . Vaping Use: Never used  Substance and Sexual Activity  . Alcohol use: No  . Drug  use: No  . Sexual activity: Not on file

## 2020-08-25 ENCOUNTER — Telehealth: Payer: Self-pay

## 2020-08-25 NOTE — Telephone Encounter (Signed)
We just saw him in the office this week and put an injection around that knee.  He has a revision knee replacement.  The MRI would not show anything and just would show scattered around the knee replacement from having a revision knee surgery and from having the injection.  He needs to wait a little longer and have an appointment to come see [place] in 4 weeks to see how he is doing prior to ordering any type of study.

## 2020-08-25 NOTE — Telephone Encounter (Signed)
I spoke with the pt and informed him out the above. He stated understanding. I scheduled him a followup in 4 weeks

## 2020-08-25 NOTE — Telephone Encounter (Signed)
Tried calling pt to inform. No answer and no way to leave a message

## 2020-08-25 NOTE — Telephone Encounter (Signed)
Patient called stating that his right knee is hurting and has a pain the shoots down from his knee.  Would like to know about having an MRI done.  CB# [phone].  Please advise.  Thank you.

## 2020-08-25 NOTE — Telephone Encounter (Signed)
Please advise 

## 2020-08-31 ENCOUNTER — Telehealth: Payer: Self-pay | Admitting: *Deleted

## 2020-08-31 NOTE — Telephone Encounter (Signed)
#  5 Ozempic came in to day for pt - Pt assistance program  Pt called and aware to pick up = in frisge with his name in it.

## 2020-09-01 ENCOUNTER — Encounter: Payer: Self-pay | Admitting: Vascular Surgery

## 2020-09-01 ENCOUNTER — Other Ambulatory Visit: Payer: Self-pay

## 2020-09-01 ENCOUNTER — Ambulatory Visit (HOSPITAL_COMMUNITY)
Admission: RE | Admit: 2020-09-01 | Discharge: 2020-09-01 | Disposition: A | Discharge status: Home / Self Care | Payer: Medicare HMO | Source: Ambulatory Visit | Attending: Vascular Surgery | Admitting: Vascular Surgery

## 2020-09-01 ENCOUNTER — Ambulatory Visit (INDEPENDENT_AMBULATORY_CARE_PROVIDER_SITE_OTHER): Payer: Medicare HMO | Admitting: Vascular Surgery

## 2020-09-01 VITALS — BP 172/113 | HR 72 | Temp 98.3°F | Resp 20 | Ht 66.0 in | Wt 227.0 lb

## 2020-09-01 DIAGNOSIS — I83813 Varicose veins of bilateral lower extremities with pain: Secondary | ICD-10-CM | POA: Diagnosis not present

## 2020-09-01 DIAGNOSIS — R6889 Other general symptoms and signs: Secondary | ICD-10-CM | POA: Diagnosis not present

## 2020-09-01 NOTE — Progress Notes (Signed)
Patient is a 60 year old male who returns for follow-up today regarding varicose veins with pain.  He was last seen June 2021.  At that time it was recommended he wear compression stockings for symptomatic relief.  He has primarily pain over a cluster of varicosities in the right medial calf.  However he also has some pain over the left lateral calf from varicose veins as well.  He has worn his compression stockings about 3 to 4 days/week.  However, it is sometimes difficult for him to place these on because of severe degenerative joint disease and inability to completely flex his knee.  We had also recommended weight loss but he has not really been successful in this.  Review of systems: He has no shortness of breath.  He has no chest pain.  He currently is having pain in his right knee and is receiving injections and treatment from orthopedics regarding this.  Past Medical History:  Diagnosis Date  . Diabetes mellitus without complication (HCC)   . History of kidney stones    removed 5 months ago  . Hypertension   . PONV (postoperative nausea and vomiting)    AGITATION after anesthesia as well  . Sleep apnea    NO CPAP    Past Surgical History:  Procedure Laterality Date  . ANTERIOR CERVICAL DECOMP/DISCECTOMY FUSION N/A 03/27/2018   Procedure: ANTERIOR CERVICAL DECOMPRESSION/DISCECTOMY FUSION C3-5;  Surgeon: Venita Lick, MD;  [facility];  Service: Orthopedics;  Laterality: N/A;  3.5 hrs  . arm surgery Right    x2 involving tendons/ulnar nerve  . BACK SURGERY    . CARPAL TUNNEL RELEASE Right   . clavical surgery    . EYE SURGERY Bilateral    glaucoma  . HERNIA REPAIR    . JOINT REPLACEMENT Right 06/2011  . TOTAL KNEE REVISION Right 04/10/2019   Procedure: RIGHT TOTAL KNEE REVISION;  Surgeon: Kathryne Hitch, MD;  Location: [facility] ORS;  Service: Orthopedics;  Laterality: Right;  . TRICEPS TENDON REPAIR     Physical exam:  Vitals:   09/01/20 1445  BP: (!) 172/113   Pulse: 72  Resp: 20  Temp: 98.3 F (36.8 C)  SpO2: 95%  Weight: 227 lb (103 kg)  Height: 5\' 6"  (1.676 m)    Extremities: 2+ dorsalis pedis pulse bilaterally  Skin: Cluster of varicosities right medial calf 4 to 5 mm diameter.  Several areas of reticular and spider type varicosities around the ankle and right lateral thigh.  Cluster of varicosities on the left lateral calf and similar reticular and spider varicosities on the left leg.  Data: He had a duplex ultrasound today for reflux.  This showed no evidence of DVT.  There was no real deep vein reflux.  He did have reflux at the saphenofemoral junction bilaterally with a greater saphenous vein diameter of about 5 to 6 mm bilaterally.  I repeated portions of his exam with the SonoSite at the bedside.  Again confirming that there is a 5 mm greater saphenous vein which is in continuity with no significant tortuosity all the way to the saphenofemoral junction bilaterally.  Assessment: Symptomatic varicose veins with pain unresponsive with conservative measures with compression therapy.  Patient would like to proceed with laser ablation and stab avulsions at this point.  Risk benefits possible complications of procedure details including but not limited to bleeding infection deep vein thrombosis 100,000 nerve injury all discussed with patient today.  He understands and agrees to proceed.  Plan: Laser ablation  greater than 20 stab avulsions right leg followed by staged similar procedure on the left pending insurance approval.  We will call him in the near future regarding scheduling.  Fabienne Bruns, MD [facility] Office: [phone]

## 2020-09-07 ENCOUNTER — Encounter: Payer: Medicare HMO | Admitting: Vascular Surgery

## 2020-09-07 ENCOUNTER — Encounter (HOSPITAL_COMMUNITY): Payer: Medicare HMO

## 2020-09-08 ENCOUNTER — Encounter (HOSPITAL_COMMUNITY): Payer: Medicare HMO

## 2020-09-08 ENCOUNTER — Encounter: Payer: Medicare HMO | Admitting: Vascular Surgery

## 2020-09-11 ENCOUNTER — Encounter (HOSPITAL_COMMUNITY): Payer: Self-pay

## 2020-09-11 ENCOUNTER — Emergency Department (HOSPITAL_COMMUNITY): Payer: Medicare HMO

## 2020-09-11 ENCOUNTER — Emergency Department (HOSPITAL_COMMUNITY)
Admission: EM | Admit: 2020-09-11 | Discharge: 2020-09-12 | Disposition: A | Discharge status: Home / Self Care | Payer: Medicare HMO | Attending: Emergency Medicine | Admitting: Emergency Medicine

## 2020-09-11 ENCOUNTER — Other Ambulatory Visit: Payer: Self-pay

## 2020-09-11 DIAGNOSIS — Z79899 Other long term (current) drug therapy: Secondary | ICD-10-CM | POA: Insufficient documentation

## 2020-09-11 DIAGNOSIS — R000 Tachycardia, unspecified: Secondary | ICD-10-CM | POA: Insufficient documentation

## 2020-09-11 DIAGNOSIS — R112 Nausea with vomiting, unspecified: Secondary | ICD-10-CM | POA: Diagnosis not present

## 2020-09-11 DIAGNOSIS — I10 Essential (primary) hypertension: Secondary | ICD-10-CM | POA: Insufficient documentation

## 2020-09-11 DIAGNOSIS — F17220 Nicotine dependence, chewing tobacco, uncomplicated: Secondary | ICD-10-CM | POA: Insufficient documentation

## 2020-09-11 DIAGNOSIS — K760 Fatty (change of) liver, not elsewhere classified: Secondary | ICD-10-CM | POA: Diagnosis not present

## 2020-09-11 DIAGNOSIS — R1011 Right upper quadrant pain: Secondary | ICD-10-CM

## 2020-09-11 DIAGNOSIS — Z7984 Long term (current) use of oral hypoglycemic drugs: Secondary | ICD-10-CM | POA: Insufficient documentation

## 2020-09-11 DIAGNOSIS — E119 Type 2 diabetes mellitus without complications: Secondary | ICD-10-CM | POA: Diagnosis not present

## 2020-09-11 DIAGNOSIS — R079 Chest pain, unspecified: Secondary | ICD-10-CM | POA: Diagnosis not present

## 2020-09-11 DIAGNOSIS — K5900 Constipation, unspecified: Secondary | ICD-10-CM | POA: Diagnosis not present

## 2020-09-11 DIAGNOSIS — R109 Unspecified abdominal pain: Secondary | ICD-10-CM | POA: Diagnosis not present

## 2020-09-11 LAB — CBC WITH DIFFERENTIAL/PLATELET
Abs Immature Granulocytes: 0.03 10*3/uL (ref 0.00–0.07)
Basophils Absolute: 0 10*3/uL (ref 0.0–0.1)
Basophils Relative: 0 %
Eosinophils Absolute: 0.1 10*3/uL (ref 0.0–0.5)
Eosinophils Relative: 2 %
HCT: 39.5 % (ref 39.0–52.0)
Hemoglobin: 13.2 g/dL (ref 13.0–17.0)
Immature Granulocytes: 0 %
Lymphocytes Relative: 28 %
Lymphs Abs: 2.6 10*3/uL (ref 0.7–4.0)
MCH: 29.3 pg (ref 26.0–34.0)
MCHC: 33.4 g/dL (ref 30.0–36.0)
MCV: 87.6 fL (ref 80.0–100.0)
Monocytes Absolute: 0.5 10*3/uL (ref 0.1–1.0)
Monocytes Relative: 5 %
Neutro Abs: 6.1 10*3/uL (ref 1.7–7.7)
Neutrophils Relative %: 65 %
Platelets: 203 10*3/uL (ref 150–400)
RBC: 4.51 MIL/uL (ref 4.22–5.81)
RDW: 13.6 % (ref 11.5–15.5)
WBC: 9.4 10*3/uL (ref 4.0–10.5)
nRBC: 0 % (ref 0.0–0.2)

## 2020-09-11 LAB — COMPREHENSIVE METABOLIC PANEL
ALT: 38 U/L (ref 0–44)
AST: 27 U/L (ref 15–41)
Albumin: 3.8 g/dL (ref 3.5–5.0)
Alkaline Phosphatase: 62 U/L (ref 38–126)
Anion gap: 12 (ref 5–15)
BUN: 18 mg/dL (ref 6–20)
CO2: 23 mmol/L (ref 22–32)
Calcium: 9 mg/dL (ref 8.9–10.3)
Chloride: 105 mmol/L (ref 98–111)
Creatinine, Ser: 1.02 mg/dL (ref 0.61–1.24)
GFR, Estimated: >60 mL/min (ref >60)
Glucose, Bld: 132 mg/dL — ABNORMAL HIGH (ref 70–99)
Potassium: 3.5 mmol/L (ref 3.5–5.1)
Sodium: 140 mmol/L (ref 135–145)
Total Bilirubin: 0.5 mg/dL (ref 0.3–1.2)
Total Protein: 6.4 g/dL — ABNORMAL LOW (ref 6.5–8.1)

## 2020-09-11 LAB — URINALYSIS, ROUTINE W REFLEX MICROSCOPIC
Bilirubin Urine: NEGATIVE
Glucose, UA: NEGATIVE mg/dL
Hgb urine dipstick: NEGATIVE
Ketones, ur: NEGATIVE mg/dL
Leukocytes,Ua: NEGATIVE
Nitrite: NEGATIVE
Protein, ur: NEGATIVE mg/dL
Specific Gravity, Urine: 1.025 (ref 1.005–1.030)
pH: 5 (ref 5.0–8.0)

## 2020-09-11 LAB — LIPASE, BLOOD: Lipase: 45 U/L (ref 11–51)

## 2020-09-11 LAB — D-DIMER, QUANTITATIVE: D-Dimer, Quant: 1.68 ug/mL-FEU — ABNORMAL HIGH (ref 0.00–0.50)

## 2020-09-11 LAB — TROPONIN I (HIGH SENSITIVITY): Troponin I (High Sensitivity): 20 ng/L — ABNORMAL HIGH (ref <18)

## 2020-09-11 MED ORDER — HYDROMORPHONE HCL 1 MG/ML IJ SOLN
1.0000 mg | Freq: Once | INTRAMUSCULAR | Status: AC
  Administered 2020-09-11: 1 mg via INTRAVENOUS
  Filled 2020-09-11: qty 1

## 2020-09-11 MED ORDER — LIDOCAINE 5 % EX PTCH
1.0000 | MEDICATED_PATCH | CUTANEOUS | Status: DC
  Administered 2020-09-11: 1 via TRANSDERMAL
  Filled 2020-09-11: qty 1

## 2020-09-11 MED ORDER — LORAZEPAM 2 MG/ML IJ SOLN
0.5000 mg | Freq: Once | INTRAMUSCULAR | Status: AC
  Administered 2020-09-11: 0.5 mg via INTRAVENOUS
  Filled 2020-09-11: qty 1

## 2020-09-11 MED ORDER — SODIUM CHLORIDE 0.9 % IV BOLUS
1000.0000 mL | Freq: Once | INTRAVENOUS | Status: AC
  Administered 2020-09-11: 1000 mL via INTRAVENOUS

## 2020-09-11 MED ORDER — ALUM & MAG HYDROXIDE-SIMETH 200-200-20 MG/5ML PO SUSP
30.0000 mL | Freq: Once | ORAL | Status: AC
  Administered 2020-09-11: 30 mL via ORAL
  Filled 2020-09-11: qty 30

## 2020-09-11 MED ORDER — KETOROLAC TROMETHAMINE 30 MG/ML IJ SOLN
30.0000 mg | Freq: Once | INTRAMUSCULAR | Status: AC
  Administered 2020-09-11: 30 mg via INTRAMUSCULAR
  Filled 2020-09-11: qty 1

## 2020-09-11 MED ORDER — PANTOPRAZOLE SODIUM 40 MG IV SOLR
40.0000 mg | Freq: Once | INTRAVENOUS | Status: AC
  Administered 2020-09-11: 40 mg via INTRAVENOUS
  Filled 2020-09-11: qty 40

## 2020-09-11 MED ORDER — ONDANSETRON HCL 4 MG/2ML IJ SOLN
4.0000 mg | Freq: Once | INTRAMUSCULAR | Status: AC
  Administered 2020-09-11: 4 mg via INTRAVENOUS
  Filled 2020-09-11: qty 2

## 2020-09-11 MED ORDER — IOHEXOL 300 MG/ML  SOLN
100.0000 mL | Freq: Once | INTRAMUSCULAR | Status: AC | PRN
  Administered 2020-09-11: 100 mL via INTRAVENOUS

## 2020-09-11 MED ORDER — MORPHINE SULFATE (PF) 4 MG/ML IV SOLN
4.0000 mg | Freq: Once | INTRAVENOUS | Status: AC
  Administered 2020-09-11: 4 mg via INTRAVENOUS
  Filled 2020-09-11: qty 1

## 2020-09-11 NOTE — ED Triage Notes (Signed)
Pt to er, pt states that he is here for abd pain, states that he is having RUQ pain that radiates around to his his flank, states that he has had the pain for the past 4 days, states that the pain has gotten worse and he talked with his neighbor who is a PA and they said that he should come in and get checked for gallstones.  States that the pain is worse when he eats.

## 2020-09-11 NOTE — ED Provider Notes (Signed)
[facility] EMERGENCY DEPARTMENT Provider Note   CSN: [id number] Arrival date & time: 09/11/20  1750     History Chief Complaint  Patient presents with  . Abdominal Pain    Alex [name] is a 60 y.o. male.  HPI   61 year old male with a history of diabetes, nephrolithiasis, sleep apnea, hypertension, hyperlipidemia who presents to the emergency department today for evaluation of abdominal pain.  States that pain has been present for the last 3 days.  It has been worsening since onset and is now constant.  It is located the right upper quadrant and radiates to the right flank.  It is worse with eating.  He has some associated nausea but denies vomiting.  States he thinks he is constipated, last BM was within the last 24 hours.  He denies any dysuria, frequency, hematuria, diarrhea.  Denies chest pain.  He has had no fevers but states that the pain is making him diaphoretic.  Past Medical History:  Diagnosis Date  . Diabetes mellitus without complication (HCC)   . History of kidney stones    removed 5 months ago  . Hypertension   . PONV (postoperative nausea and vomiting)    AGITATION after anesthesia as well  . Sleep apnea    NO CPAP    Patient Active Problem List   Diagnosis Date Noted  . Hyperlipidemia associated with type 2 diabetes mellitus (HCC) 06/23/2020  . Chronic neck pain 08/10/2019  . Status post revision of total replacement of right knee 04/10/2019  . Loose right total knee arthroplasty (HCC) 03/09/2019  . Pain in right knee 02/17/2019  . Neuropathic pain 01/01/2019  . Cervical spine degeneration 12/30/2018  . Right wrist pain 12/19/2018  . Right arm numbness 12/19/2018  . Acquired trigger finger of left middle finger 08/22/2018  . Pain of left hand 08/22/2018  . S/P cervical spinal fusion 03/27/2018  . Morbid obesity (HCC) 02/21/2018  . Pain in joint of left shoulder 02/05/2018  . Osteoarthritis of right glenohumeral joint 01/21/2018  . Pain in joint of  right shoulder 12/30/2017  . Tear of right rotator cuff 12/17/2017  . Recurrent nephrolithiasis 09/18/2017  . Right flank pain 08/20/2017  . Drug dependence (HCC) 08/19/2017  . Diabetes mellitus type 2 in obese (HCC) 03/28/2017  . Hypertension associated with diabetes (HCC) 08/25/2016    Past Surgical History:  Procedure Laterality Date  . ANTERIOR CERVICAL DECOMP/DISCECTOMY FUSION N/A 03/27/2018   Procedure: ANTERIOR CERVICAL DECOMPRESSION/DISCECTOMY FUSION C3-5;  Surgeon: Venita Lick, MD;  Location: [facility] OR;  Service: Orthopedics;  Laterality: N/A;  3.5 hrs  . arm surgery Right    x2 involving tendons/ulnar nerve  . BACK SURGERY    . CARPAL TUNNEL RELEASE Right   . clavical surgery    . EYE SURGERY Bilateral    glaucoma  . HERNIA REPAIR    . JOINT REPLACEMENT Right 06/2011  . TOTAL KNEE REVISION Right 04/10/2019   Procedure: RIGHT TOTAL KNEE REVISION;  Surgeon: Kathryne Hitch, MD;  Location: [facility] ORS;  Service: Orthopedics;  Laterality: Right;  . TRICEPS TENDON REPAIR         Family History  Problem Relation Age of Onset  . Cancer Mother   . Diabetes Father   . Hypertension Father   . Heart attack Father     Social History   Tobacco Use  . Smoking status: Never Smoker  . Smokeless tobacco: Current User    Types: Chew  Vaping Use  . Vaping  Use: Never used  Substance Use Topics  . Alcohol use: No  . Drug use: No    Home Medications Prior to Admission medications   Medication Sig Start Date End Date Taking? Authorizing Provider  diclofenac (VOLTAREN) 75 MG EC tablet Take 75 mg by mouth 2 (two) times daily as needed. 08/30/20  Yes [provider]  diclofenac Sodium (VOLTAREN) 1 % GEL Apply 2 g topically 4 (four) times daily. 07/23/19  Yes Dettinger, Elige Radon, MD  ketoconazole (NIZORAL) 2 % shampoo SHAMPOO, LATHER, LEAVE FOR 10 MINUTES, THEN RINSE. USE 2 TIMES A WEEK FOR UP TO 4 WEEKS Patient taking differently: Apply 1 application topically See admin  instructions. SHAMPOO, LATHER, LEAVE FOR 10 MINUTES, THEN RINSE. USE 2 TIMES A WEEK FOR UP TO 4 WEEKS 08/05/20  Yes Dettinger, Elige Radon, MD  lisinopril (ZESTRIL) 40 MG tablet Take 1 tablet (40 mg total) by mouth daily. 11/26/19  Yes Dettinger, Elige Radon, MD  metFORMIN (GLUCOPHAGE) 500 MG tablet Take 1 tablet (500 mg total) by mouth 2 (two) times daily with a meal. TAKE 2 TABLET BY MOUTH TWICE A DAY 04/06/20  Yes Dettinger, Elige Radon, MD  methocarbamol (ROBAXIN) 500 MG tablet Take 1 tablet (500 mg total) by mouth every 8 (eight) hours as needed for muscle spasms. 08/10/19  Yes Marcello Fennel, MD  oxymetazoline (AFRIN) 0.05 % nasal spray Place 1 spray into both nostrils every 6 (six) hours as needed for congestion.    Yes [provider]  amLODipine (NORVASC) 5 MG tablet Take 1 tablet (5 mg total) by mouth daily. Patient not taking: No sig reported 06/23/20   Dettinger, Elige Radon, MD  HYDROcodone-acetaminophen (NORCO/VICODIN) 5-325 MG tablet Take 1 tablet by mouth every 6 (six) hours as needed. Patient not taking: No sig reported 07/22/20   [provider]  indomethacin (INDOCIN) 50 MG capsule Take 1 capsule (50 mg total) by mouth 2 (two) times daily with a meal. Patient not taking: No sig reported 01/13/20   Mechele Claude, MD  pregabalin (LYRICA) 75 MG capsule Take 1 capsule (75 mg total) by mouth 2 (two) times daily as needed (for neuropathy pain). Patient not taking: No sig reported 04/02/19   Dettinger, Elige Radon, MD  rosuvastatin (CRESTOR) 10 MG tablet Take 1 tablet (10 mg total) by mouth daily. Patient not taking: No sig reported 06/23/20   Dettinger, Elige Radon, MD  Semaglutide,0.25 or 0.5MG /DOS, (OZEMPIC, 0.25 OR 0.5 MG/DOSE,) 2 MG/1.5ML SOPN Inject 0.5 mg into the skin once a week. 04/06/20   Dettinger, Elige Radon, MD    Allergies    Morphine and related, Buprenorphine hcl-naloxone hcl, and Codeine  Review of Systems   Review of Systems  Constitutional: Positive for diaphoresis.  Negative for chills and fever.  HENT: Negative for ear pain and sore throat.   Eyes: Negative for pain and visual disturbance.  Respiratory: Negative for cough and shortness of breath.   Cardiovascular: Negative for chest pain.  Gastrointestinal: Positive for abdominal pain, constipation and nausea. Negative for diarrhea and vomiting.  Genitourinary: Positive for flank pain. Negative for dysuria, frequency and hematuria.  Musculoskeletal: Negative for back pain.  Skin: Negative for rash.  Neurological: Negative for headaches.  All other systems reviewed and are negative.   Physical Exam Updated Vital Signs BP 116/87 (BP Location: Right Arm)   Pulse 78   Temp 98.6 F (37 C) (Oral)   Resp 18   Ht 5\' 6"  (1.676 m)   Wt 89.8  kg   SpO2 97%   BMI 31.96 kg/m   Physical Exam Vitals and nursing note reviewed.  Constitutional:      General: He is in acute distress.     Appearance: He is well-developed and well-nourished. He is ill-appearing.  HENT:     Head: Normocephalic and atraumatic.  Eyes:     Conjunctiva/sclera: Conjunctivae normal.  Cardiovascular:     Rate and Rhythm: Normal rate and regular rhythm.     Heart sounds: Normal heart sounds. No murmur heard.   Pulmonary:     Effort: Pulmonary effort is normal. No respiratory distress.     Breath sounds: Normal breath sounds. No wheezing, rhonchi or rales.  Abdominal:     General: Bowel sounds are normal.     Palpations: Abdomen is soft.     Tenderness: There is abdominal tenderness in the right upper quadrant. There is guarding and rebound. There is no right CVA tenderness or left CVA tenderness.  Musculoskeletal:        General: No edema.     Cervical back: Neck supple.       Back:     Comments: Reproducible TTP over the paraspinous muscles with muscle spasm  Skin:    General: Skin is warm and dry.  Neurological:     Mental Status: He is alert.  Psychiatric:        Mood and Affect: Mood and affect normal.      ED Results / Procedures / Treatments   Labs (all labs ordered are listed, but only abnormal results are displayed) Labs Reviewed  COMPREHENSIVE METABOLIC PANEL - Abnormal; Notable for the following components:      Result Value   Glucose, Bld 132 (*)    Total Protein 6.4 (*)    All other components within normal limits  TROPONIN I (HIGH SENSITIVITY) - Abnormal; Notable for the following components:   Troponin I (High Sensitivity) 20 (*)    All other components within normal limits  LIPASE, BLOOD  CBC WITH DIFFERENTIAL/PLATELET  URINALYSIS, ROUTINE W REFLEX MICROSCOPIC  D-DIMER, QUANTITATIVE    EKG None  Radiology CT ABDOMEN PELVIS W CONTRAST  Result Date: 09/11/2020 CLINICAL DATA:  Right upper quadrant pain. EXAM: CT ABDOMEN AND PELVIS WITH CONTRAST TECHNIQUE: Multidetector CT imaging of the abdomen and pelvis was performed using the standard protocol following bolus administration of intravenous contrast. CONTRAST:  OMNIPAQUE IOHEXOL 300 MG/ML  SOLN COMPARISON:  None. FINDINGS: Lower chest: A chronic seventh left rib deformity is seen. Hepatobiliary: There is diffuse fatty infiltration of the liver parenchyma. No focal liver abnormality is seen. No gallstones, gallbladder wall thickening, or biliary dilatation. Pancreas: Unremarkable. No pancreatic ductal dilatation or surrounding inflammatory changes. Spleen: Normal in size without focal abnormality. Adrenals/Urinary Tract: Adrenal glands are unremarkable. Kidneys are normal, without obstructing renal calculi, focal lesion, or hydronephrosis. A 3 mm nonobstructing renal stone is seen within the posterior aspect of the mid right kidney. Bladder is unremarkable. Stomach/Bowel: Stomach is within normal limits. Appendix appears normal. No evidence of bowel wall thickening, distention, or inflammatory changes. Noninflamed diverticula are seen within the proximal sigmoid colon. Vascular/Lymphatic: Aortic atherosclerosis. No enlarged  abdominal or pelvic lymph nodes. Reproductive: Prostate is unremarkable. Other: No abdominal wall hernia or abnormality. No abdominopelvic ascites. Musculoskeletal: Multiple metallic density surgical screws are seen extending across the left sacroiliac joint. A chronic deformity is seen involving the superior endplate of the T12 vertebral body. Multilevel degenerative changes are seen throughout the lumbar spine.  IMPRESSION: 1. Hepatic steatosis. 2. 3 mm nonobstructing renal stone within the right kidney. 3. Sigmoid diverticulosis. 4. Aortic atherosclerosis. Aortic Atherosclerosis (ICD10-I70.0). Electronically Signed   By: Aram Candelahaddeus  Houston M.D.   On: 09/11/2020 21:41   DG Chest Portable 1 View  Result Date: 09/11/2020 CLINICAL DATA:  Right upper abdominal pain. EXAM: PORTABLE CHEST 1 VIEW COMPARISON:  None. FINDINGS: There is no evidence of acute infiltrate, pleural effusion or pneumothorax. The heart size and mediastinal contours are within normal limits. There is tortuosity of the descending thoracic aorta. A radiopaque fixation plate and screws are seen along the mid and distal portion of the left clavicle. Chronic third, fourth and seventh left rib fractures are seen. IMPRESSION: No acute cardiopulmonary disease. Electronically Signed   By: Aram Candelahaddeus  Houston M.D.   On: 09/11/2020 22:49    Procedures Procedures   Medications Ordered in ED Medications  lidocaine (LIDODERM) 5 % 1 patch (has no administration in time range)  LORazepam (ATIVAN) injection 0.5 mg (has no administration in time range)  morphine 4 MG/ML injection 4 mg (4 mg Intravenous Given 09/11/20 1847)  ondansetron (ZOFRAN) injection 4 mg (4 mg Intravenous Given 09/11/20 1848)  sodium chloride 0.9 % bolus 1,000 mL (0 mLs Intravenous Stopping Infusion hung by another clincian 09/11/20 2104)  HYDROmorphone (DILAUDID) injection 1 mg (1 mg Intravenous Given 09/11/20 2100)  iohexol (OMNIPAQUE) 300 MG/ML solution 100 mL (100 mLs Intravenous  Contrast Given 09/11/20 2120)  pantoprazole (PROTONIX) injection 40 mg (40 mg Intravenous Given 09/11/20 2212)  alum & mag hydroxide-simeth (MAALOX/MYLANTA) 200-200-20 MG/5ML suspension 30 mL (30 mLs Oral Given 09/11/20 2212)  ketorolac (TORADOL) 30 MG/ML injection 30 mg (30 mg Intramuscular Given 09/11/20 2227)  HYDROmorphone (DILAUDID) injection 1 mg (1 mg Intravenous Given 09/11/20 2227)    ED Course  I have reviewed the triage vital signs and the nursing notes.  Pertinent labs & imaging results that were available during my care of the patient were reviewed by me and considered in my medical decision making (see chart for details).    MDM Rules/Calculators/A&P                          60 year old male presenting the emergency department today for evaluation of right upper quadrant abdominal pain and nausea.  Reviewed/interpreted labs CBC unremarkable CMP unremarkable Lipase wnl Trop marginally elevated at 20, delta pending at shift change UA neg  Reviewed/interpreted imaging CT abd/pelvis - 1. Hepatic steatosis. 2. 3 mm nonobstructing renal stone within the right kidney. 3. Sigmoid diverticulosis. 4. Aortic atherosclerosis. Aortic Atherosclerosis CXR - No acute cardiopulmonary disease.   At shift change, care transitioned to Dr. Judd Lienelo to f/u on pending trop and ddimer  Final Clinical Impression(s) / ED Diagnoses Final diagnoses:  None    Rx / DC Orders ED Discharge Orders    None       Rayne DuCouture, Cortni S, PA-C 09/11/20 2300    Geoffery Lyonselo, Douglas, MD 09/12/20 0134

## 2020-09-11 NOTE — ED Notes (Signed)
Pt reports abd pain x 3 days. Pt guarding is abd. Denies N/V

## 2020-09-12 ENCOUNTER — Other Ambulatory Visit: Payer: Self-pay

## 2020-09-12 ENCOUNTER — Encounter (HOSPITAL_COMMUNITY): Payer: Self-pay

## 2020-09-12 ENCOUNTER — Emergency Department (HOSPITAL_COMMUNITY): Payer: Medicare HMO

## 2020-09-12 ENCOUNTER — Emergency Department (HOSPITAL_COMMUNITY)
Admission: EM | Admit: 2020-09-12 | Discharge: 2020-09-12 | Disposition: A | Discharge status: Home / Self Care | Payer: Medicare HMO | Source: Home / Self Care | Attending: Emergency Medicine | Admitting: Emergency Medicine

## 2020-09-12 ENCOUNTER — Telehealth: Payer: Self-pay

## 2020-09-12 DIAGNOSIS — R079 Chest pain, unspecified: Secondary | ICD-10-CM | POA: Diagnosis not present

## 2020-09-12 DIAGNOSIS — R000 Tachycardia, unspecified: Secondary | ICD-10-CM | POA: Insufficient documentation

## 2020-09-12 DIAGNOSIS — R112 Nausea with vomiting, unspecified: Secondary | ICD-10-CM | POA: Insufficient documentation

## 2020-09-12 DIAGNOSIS — Z96651 Presence of right artificial knee joint: Secondary | ICD-10-CM | POA: Insufficient documentation

## 2020-09-12 DIAGNOSIS — R109 Unspecified abdominal pain: Secondary | ICD-10-CM | POA: Diagnosis not present

## 2020-09-12 DIAGNOSIS — Z7984 Long term (current) use of oral hypoglycemic drugs: Secondary | ICD-10-CM | POA: Insufficient documentation

## 2020-09-12 DIAGNOSIS — I10 Essential (primary) hypertension: Secondary | ICD-10-CM | POA: Insufficient documentation

## 2020-09-12 DIAGNOSIS — R1011 Right upper quadrant pain: Secondary | ICD-10-CM

## 2020-09-12 DIAGNOSIS — E119 Type 2 diabetes mellitus without complications: Secondary | ICD-10-CM | POA: Insufficient documentation

## 2020-09-12 DIAGNOSIS — K760 Fatty (change of) liver, not elsewhere classified: Secondary | ICD-10-CM | POA: Diagnosis not present

## 2020-09-12 DIAGNOSIS — Z79899 Other long term (current) drug therapy: Secondary | ICD-10-CM | POA: Insufficient documentation

## 2020-09-12 LAB — TROPONIN I (HIGH SENSITIVITY): Troponin I (High Sensitivity): 23 ng/L — ABNORMAL HIGH (ref <18)

## 2020-09-12 MED ORDER — IOHEXOL 350 MG/ML SOLN
100.0000 mL | Freq: Once | INTRAVENOUS | Status: AC | PRN
  Administered 2020-09-12: 100 mL via INTRAVENOUS

## 2020-09-12 MED ORDER — DICYCLOMINE HCL 20 MG PO TABS: 20.0000 mg | ORAL_TABLET | Freq: Two times a day (BID) | ORAL | 0 refills | Status: DC

## 2020-09-12 MED ORDER — OXYCODONE-ACETAMINOPHEN 5-325 MG PO TABS: 1.0000 | ORAL_TABLET | Freq: Four times a day (QID) | ORAL | 0 refills | Status: DC | PRN

## 2020-09-12 MED ORDER — DICYCLOMINE HCL 10 MG/ML IM SOLN
20.0000 mg | Freq: Once | INTRAMUSCULAR | Status: AC
  Administered 2020-09-12: 20 mg via INTRAMUSCULAR
  Filled 2020-09-12: qty 2

## 2020-09-12 MED ORDER — OMEPRAZOLE 20 MG PO CPDR: 20.0000 mg | DELAYED_RELEASE_CAPSULE | Freq: Every day | ORAL | 0 refills | Status: DC

## 2020-09-12 NOTE — ED Provider Notes (Signed)
°[facility] °Provider Note ° ° °CSN: [id number] °Arrival date & time: 09/12/20  1554 ° °  ° °History °Chief Complaint  °Patient presents with  °• Cholelithiasis  °• Abdominal Pain  ° ° °Alex [name] is a 60 y.o. male. ° °HPI ° ° Patient with significant medical history of diabetes, kidney stones, hypertension presents with chief complaint of right upper quadrant pain and flank pain.  Patient endorses pain has been going on since last Tuesday but the pain has gotten to be worse since yesterday.  He stated got worse after he lift up a heavy 2 x 4.  He had a sharp stabbing sensation in his right upper quadrant, it radiates into his flank.  It is worsened with movement, and eating fatty foods, associated with nausea and vomiting.  He denies urinary symptoms, urinary urgency, frequency, hematuria, dysuria, denies constipation or diarrhea, has no history of gallbladder abnormalities, no significant abdominal history.  Denies history of pancreatitis, stomach ulcers, denies alcohol or nicotine use.  He has no connective tissue disorder, no history of aneurysms.  Patient denies alleviating factors.  Patient was recently seen here yesterday for same complaint, worked up extensively, CMP showing no elevation liver enzymes or alk phos, no leukocytosis, lipase unremarkable, CT angio chest was negative for PE, CT abdomen pelvis does not reveal any acute findings, unfortunately ultrasound was not available last night and cannot ultrasound abdomen.  Patient denies headaches, fevers, chills, shortness breath, chest pain, worsening pedal edema. °Past Medical History:  °Diagnosis Date  °• Diabetes mellitus without complication (HCC)   °• History of kidney stones   ° removed 5 months ago  °• Hypertension   °• PONV (postoperative nausea and vomiting)   ° AGITATION after anesthesia as well  °• Sleep apnea   ° NO CPAP  ° ° °Patient Active Problem List  ° Diagnosis Date Noted  °• Hyperlipidemia associated with type 2  diabetes mellitus (HCC) 06/23/2020  °• Chronic neck pain 08/10/2019  °• Status post revision of total replacement of right knee 04/10/2019  °• Loose right total knee arthroplasty (HCC) 03/09/2019  °• Pain in right knee 02/17/2019  °• Neuropathic pain 01/01/2019  °• Cervical spine degeneration 12/30/2018  °• Right wrist pain 12/19/2018  °• Right arm numbness 12/19/2018  °• Acquired trigger finger of left middle finger 08/22/2018  °• Pain of left hand 08/22/2018  °• S/P cervical spinal fusion 03/27/2018  °• Morbid obesity (HCC) 02/21/2018  °• Pain in joint of left shoulder 02/05/2018  °• Osteoarthritis of right glenohumeral joint 01/21/2018  °• Pain in joint of right shoulder 12/30/2017  °• Tear of right rotator cuff 12/17/2017  °• Recurrent nephrolithiasis 09/18/2017  °• Right flank pain 08/20/2017  °• Drug dependence (HCC) 08/19/2017  °• Diabetes mellitus type 2 in obese (HCC) 03/28/2017  °• Hypertension associated with diabetes (HCC) 08/25/2016  ° ° °Past Surgical History:  °Procedure Laterality Date  °• ANTERIOR CERVICAL DECOMP/DISCECTOMY FUSION N/A 03/27/2018  ° Procedure: ANTERIOR CERVICAL DECOMPRESSION/DISCECTOMY FUSION C3-5;  Surgeon: Brooks, Dahari, MD;  Location: [facility] OR;  Service: Orthopedics;  Laterality: N/A;  3.5 hrs  °• arm surgery Right   ° x2 involving tendons/ulnar nerve  °• BACK SURGERY    °• CARPAL TUNNEL RELEASE Right   °• clavical surgery    °• EYE SURGERY Bilateral   ° glaucoma  °• HERNIA REPAIR    °• JOINT REPLACEMENT Right 06/2011  °• TOTAL KNEE REVISION Right 04/10/2019  ° Procedure: RIGHT TOTAL KNEE   REVISION;  Surgeon: Blackman, Christopher Y, MD;  Location: [facility];  Service: Orthopedics;  Laterality: Right;  °• TRICEPS TENDON REPAIR    °  ° ° ° °Family History  °Problem Relation Age of Onset  °• Cancer Mother   °• Diabetes Father   °• Hypertension Father   °• Heart attack Father   ° ° °Social History  ° °Tobacco Use  °• Smoking status: Never Smoker  °• Smokeless tobacco: Current User  °  Types:  Chew  °Vaping Use  °• Vaping Use: Never used  °Substance Use Topics  °• Alcohol use: No  °• Drug use: No  ° ° °Home Medications °Prior to Admission medications   °Medication Sig Start Date End Date Taking? Authorizing Provider  °dicyclomine (BENTYL) 20 MG tablet Take 1 tablet (20 mg total) by mouth 2 (two) times daily. 09/12/20  Yes Faulkner, William J, PA-C  °omeprazole (PRILOSEC) 20 MG capsule Take 1 capsule (20 mg total) by mouth daily. 09/12/20 10/12/20 Yes Faulkner, William J, PA-C  °amLODipine (NORVASC) 5 MG tablet Take 1 tablet (5 mg total) by mouth daily. °Patient not taking: No sig reported 06/23/20   Dettinger, Joshua A, MD  °diclofenac (VOLTAREN) 75 MG EC tablet Take 75 mg by mouth 2 (two) times daily as needed. 08/30/20   [provider]  °diclofenac Sodium (VOLTAREN) 1 % GEL Apply 2 g topically 4 (four) times daily. 07/23/19   Dettinger, Joshua A, MD  °HYDROcodone-acetaminophen (NORCO/VICODIN) 5-325 MG tablet Take 1 tablet by mouth every 6 (six) hours as needed. °Patient not taking: No sig reported 07/22/20   [provider]  °indomethacin (INDOCIN) 50 MG capsule Take 1 capsule (50 mg total) by mouth 2 (two) times daily with a meal. °Patient not taking: No sig reported 01/13/20   Stacks, Warren, MD  °ketoconazole (NIZORAL) 2 % shampoo SHAMPOO, LATHER, LEAVE FOR 10 MINUTES, THEN RINSE. USE 2 TIMES A WEEK FOR UP TO 4 WEEKS °Patient taking differently: Apply 1 application topically See admin instructions. SHAMPOO, LATHER, LEAVE FOR 10 MINUTES, THEN RINSE. USE 2 TIMES A WEEK FOR UP TO 4 WEEKS 08/05/20   Dettinger, Joshua A, MD  °lisinopril (ZESTRIL) 40 MG tablet Take 1 tablet (40 mg total) by mouth daily. 11/26/19   Dettinger, Joshua A, MD  °metFORMIN (GLUCOPHAGE) 500 MG tablet Take 1 tablet (500 mg total) by mouth 2 (two) times daily with a meal. TAKE 2 TABLET BY MOUTH TWICE A DAY 04/06/20   Dettinger, Joshua A, MD  °methocarbamol (ROBAXIN) 500 MG tablet Take 1 tablet (500 mg total) by mouth every 8  (eight) hours as needed for muscle spasms. 08/10/19   Patel, Ankit Anil, MD  °oxyCODONE-acetaminophen (PERCOCET) 5-325 MG tablet Take 1-2 tablets by mouth every 6 (six) hours as needed. 09/12/20   Delo, Douglas, MD  °oxymetazoline (AFRIN) 0.05 % nasal spray Place 1 spray into both nostrils every 6 (six) hours as needed for congestion.     [provider]  °pregabalin (LYRICA) 75 MG capsule Take 1 capsule (75 mg total) by mouth 2 (two) times daily as needed (for neuropathy pain). °Patient not taking: No sig reported 04/02/19   Dettinger, Joshua A, MD  °rosuvastatin (CRESTOR) 10 MG tablet Take 1 tablet (10 mg total) by mouth daily. °Patient not taking: No sig reported 06/23/20   Dettinger, Joshua A, MD  °Semaglutide,0.25 or 0.5MG/DOS, (OZEMPIC, 0.25 OR 0.5 MG/DOSE,) 2 MG/1.5ML SOPN Inject 0.5 mg into the skin once a week. 04/06/20   Dettinger,   Dettinger, Fransisca Kaufmann, MD    Allergies    Morphine and related, Buprenorphine hcl-naloxone hcl, and Codeine  Review of Systems   Review of Systems  Constitutional: Negative for chills and fever.  HENT: Negative for congestion.   Respiratory: Negative for shortness of breath.   Cardiovascular: Negative for chest pain.  Gastrointestinal: Positive for abdominal pain, nausea and vomiting.  Genitourinary: Positive for flank pain. Negative for dysuria and enuresis.  Musculoskeletal: Negative for back pain.  Skin: Negative for rash.  Neurological: Negative for dizziness and headaches.  Hematological: Does not bruise/bleed easily.    Physical Exam Updated Vital Signs BP (!) 165/89    Pulse 86    Temp 98.5 F (36.9 C) (Oral)    Resp (!) 22    Ht 5' 6" (1.676 m)    Wt 89.8 kg    SpO2 95%    BMI 31.95 kg/m   Physical Exam Vitals and nursing note reviewed.  Constitutional:      General: He is not in acute distress.    Appearance: He is not ill-appearing.  HENT:     Head: Normocephalic and atraumatic.     Nose: No congestion.  Eyes:     Conjunctiva/sclera:  Conjunctivae normal.  Cardiovascular:     Rate and Rhythm: Normal rate and regular rhythm.     Pulses: Normal pulses.     Heart sounds: No murmur heard. No friction rub. No gallop.   Pulmonary:     Effort: No respiratory distress.     Breath sounds: No wheezing, rhonchi or rales.  Abdominal:     General: There is no distension.     Palpations: Abdomen is soft.     Tenderness: There is abdominal tenderness. There is no right CVA tenderness or left CVA tenderness.     Comments: Patient is abdomen is nondistended, normoactive bowel sounds, dull to percussion.  He had right upper quadrant pain.  Positive Murphy sign, negative McBurney point, no rebound tenderness or peritoneal sign present.  Musculoskeletal:     Right lower leg: No edema.     Left lower leg: No edema.  Skin:    General: Skin is warm and dry.  Neurological:     Mental Status: He is alert.  Psychiatric:        Mood and Affect: Mood normal.     ED Results / Procedures / Treatments   Labs (all labs ordered are listed, but only abnormal results are displayed) Labs Reviewed - No data to display  EKG None  Radiology CT Angio Chest PE W and/or Wo Contrast  Result Date: 09/12/2020 CLINICAL DATA:  Right-sided chest pain EXAM: CT ANGIOGRAPHY CHEST WITH CONTRAST TECHNIQUE: Multidetector CT imaging of the chest was performed using the standard protocol during bolus administration of intravenous contrast. Multiplanar CT image reconstructions and MIPs were obtained to evaluate the vascular anatomy. CONTRAST:  131m OMNIPAQUE IOHEXOL 350 MG/ML SOLN COMPARISON:  Chest x-ray from the previous day. FINDINGS: Cardiovascular: Thoracic aorta demonstrates atherosclerotic calcifications without aneurysmal dilatation or dissection. No cardiac enlargement is seen. Mild coronary calcifications are noted. The pulmonary artery as visualized shows a normal branching pattern. No filling defect to suggest pulmonary embolism is seen.  Mediastinum/Nodes: Thoracic inlet is within normal limits. No sizable hilar or mediastinal adenopathy is noted. The esophagus as visualized is within normal limits. Lungs/Pleura: Lungs are well aerated bilaterally. No focal infiltrate or sizable effusion is seen. Minimal atelectatic changes are noted in the left upper lobe. Upper Abdomen:  No acute abnormality. Musculoskeletal: Multiple old healed rib fractures are noted. No acute compression deformities are seen. Degenerative change of the thoracic spine is noted. Review of the MIP images confirms the above findings. IMPRESSION: No evidence of pulmonary emboli. Mild atelectatic changes in the left upper lobe. Aortic Atherosclerosis (ICD10-I70.0). Electronically Signed   By: Inez Catalina M.D.   On: 09/12/2020 00:38   CT ABDOMEN PELVIS W CONTRAST  Result Date: 09/11/2020 CLINICAL DATA:  Right upper quadrant pain. EXAM: CT ABDOMEN AND PELVIS WITH CONTRAST TECHNIQUE: Multidetector CT imaging of the abdomen and pelvis was performed using the standard protocol following bolus administration of intravenous contrast. CONTRAST:  137m OMNIPAQUE IOHEXOL 300 MG/ML  SOLN COMPARISON:  None. FINDINGS: Lower chest: A chronic seventh left rib deformity is seen. Hepatobiliary: There is diffuse fatty infiltration of the liver parenchyma. No focal liver abnormality is seen. No gallstones, gallbladder wall thickening, or biliary dilatation. Pancreas: Unremarkable. No pancreatic ductal dilatation or surrounding inflammatory changes. Spleen: Normal in size without focal abnormality. Adrenals/Urinary Tract: Adrenal glands are unremarkable. Kidneys are normal, without obstructing renal calculi, focal lesion, or hydronephrosis. A 3 mm nonobstructing renal stone is seen within the posterior aspect of the mid right kidney. Bladder is unremarkable. Stomach/Bowel: Stomach is within normal limits. Appendix appears normal. No evidence of bowel wall thickening, distention, or inflammatory  changes. Noninflamed diverticula are seen within the proximal sigmoid colon. Vascular/Lymphatic: Aortic atherosclerosis. No enlarged abdominal or pelvic lymph nodes. Reproductive: Prostate is unremarkable. Other: No abdominal wall hernia or abnormality. No abdominopelvic ascites. Musculoskeletal: Multiple metallic density surgical screws are seen extending across the left sacroiliac joint. A chronic deformity is seen involving the superior endplate of the TA41vertebral body. Multilevel degenerative changes are seen throughout the lumbar spine. IMPRESSION: 1. Hepatic steatosis. 2. 3 mm nonobstructing renal stone within the right kidney. 3. Sigmoid diverticulosis. 4. Aortic atherosclerosis. Aortic Atherosclerosis (ICD10-I70.0). Electronically Signed   By: TVirgina NorfolkM.D.   On: 09/11/2020 21:41   [place]domen Limited  Result Date: 09/12/2020 CLINICAL DATA:  Upper abdominal pain EXAM: ULTRASOUND ABDOMEN LIMITED RIGHT UPPER QUADRANT COMPARISON:  CT abdomen and pelvis September 11, 2020 FINDINGS: Gallbladder: No gallstones or wall thickening visualized. There is no pericholecystic fluid. No sonographic Murphy sign noted by sonographer. Common bile duct: Diameter: 4 mm. No intrahepatic or extrahepatic biliary duct dilatation. Liver: No focal lesion identified. Liver echogenicity is increased diffusely. Portal vein is patent on color Doppler imaging with normal direction of blood flow towards the liver. Other: None. IMPRESSION: Diffuse increase in liver echogenicity, an appearance indicative of hepatic steatosis. No focal liver lesions evident. Note that the sensitivity of ultrasound for detection of focal liver lesions is diminished in this circumstance. Study otherwise unremarkable. Electronically Signed   By: WLowella GripIII M.D.   On: 09/12/2020 16:48   DG Chest Portable 1 View  Result Date: 09/11/2020 CLINICAL DATA:  Right upper abdominal pain. EXAM: PORTABLE CHEST 1 VIEW COMPARISON:  None. FINDINGS: There  is no evidence of acute infiltrate, pleural effusion or pneumothorax. The heart size and mediastinal contours are within normal limits. There is tortuosity of the descending thoracic aorta. A radiopaque fixation plate and screws are seen along the mid and distal portion of the left clavicle. Chronic third, fourth and seventh left rib fractures are seen. IMPRESSION: No acute cardiopulmonary disease. Electronically Signed   By: TVirgina NorfolkM.D.   On: 09/11/2020 22:49    Procedures Procedures   Medications Ordered in ED Medications  dicyclomine (BENTYL) injection 20 mg (20 mg Intramuscular Given 09/12/20 1628)  ° ° °ED Course  °I have reviewed the triage vital signs and the nursing notes. ° °Pertinent labs & imaging results that were available during my care of the patient were reviewed by me and considered in my medical decision making (see chart for details). ° °  °MDM Rules/Calculators/A&P °                        °Initial impression-patient presents with right upper quadrant pain.  He is alert, does not appear in acute distress, vital signs notable for tachycardia and hypertension.  Will obtain basic lab work, order limited ultrasound provide patient with Bentyl and reevaluate. ° °Work-up-US ultrasound shows diffuse increase in liver echogenicity indication of hepatic steatosis.  ° °Rule out-I have low suspicion for systemic infection as patient is nontoxic-appearing, vital signs reassuring, no obvious source infection on my exam.  Low suspicion for gallbladder abnormality as ultrasound is negative for acute findings.  Patient had lab work performed yesterday showing no elevation in liver enzymes or alk phos.  Low suspicion for UTI or Pilo nephritis as patient denies urinary symptoms, no CVA tenderness noted my exam, urine was performed yesterday came back unremarkable.  Low suspicion for kidney stone as there is no noted hematuria on UA.  Low suspicion for ruptured stomach ulcer as patient's abdomen is  nondistended, nontympanic, no peritoneal signs on my exam.  Low suspicion for AAA as patient has low risk factors, no aneurysm seen on CT abdomen pelvis yesterday. ° °Reassessment-patient is reassessed at the providing him with Bentyl, and updated him on lab works.  Vital signs have improved, patient has no complaints at this time.  Patient is agreeable for discharge. ° °Plan-I suspect patient is suffering from gastritis versus muscular strain.  Will provide patient with PPI and Bentyl for spasms.  will have him follow-up with GI for further evaluation. ° °Vital signs have remained stable, no indication for hospital admission. Patient given at home care as well strict return precautions.  Patient verbalized that they understood agreed to said plan. ° ° °Final Clinical Impression(s) / ED Diagnoses °Final diagnoses:  °Right upper quadrant abdominal pain  ° ° °Rx / DC Orders °ED Discharge Orders   °      Ordered  °  dicyclomine (BENTYL) 20 MG tablet  2 times daily       ° 09/12/20 1713  °  omeprazole (PRILOSEC) 20 MG capsule  Daily       ° 09/12/20 1713  °  °  °  ° °  °Faulkner, William J, PA-C °09/12/20 1733 ° °  °Butler, Michael C, MD °09/13/20 1037 ° °

## 2020-09-12 NOTE — Telephone Encounter (Signed)
REFERRAL REQUEST Telephone Note  Have you been seen at our office for this problem? [facility] (Advise that they may need an appointment with their PCP before a referral can be done)  Reason for Referral: [place] pt seen at [facility] yesterday Referral discussed with patient:  Best contact number of patient for referral team:   [phone] Has patient been seen by a specialist for this issue before:  Patient provider preference for referral:  Patient location preference for referral: [facility]  Per [facility] ok to send message   Patient notified that referrals can take up to a week or longer to process. If they haven't heard anything within a week they should call back and speak with the referral department.

## 2020-09-12 NOTE — Discharge Instructions (Signed)
Take Percocet as prescribed as needed for pain.  Return tomorrow at the given time for an ultrasound of your abdomen to evaluate your gallbladder.  Follow-up with your primary doctor in the next 2 to 3 days if symptoms are not improving.

## 2020-09-12 NOTE — ED Triage Notes (Signed)
Patient refuses to answer any questions

## 2020-09-12 NOTE — Discharge Instructions (Addendum)
Seen here for right upper quadrant pain.  Possible this could be a muscular strain versus gastritis.  I have given you an acid pill please take as prescribed.  Have also given you another medication called Bentyl this will help with muscle spasms please take as prescribed.  Would like to follow-up with GI for further evaluation given contact information above please call.  Come back to the emergency department if you develop chest pain, shortness of breath, severe abdominal pain, uncontrolled nausea, vomiting, diarrhea.

## 2020-09-12 NOTE — ED Triage Notes (Signed)
States he was here last night for gallbladder pain, upset no one called him today

## 2020-09-13 ENCOUNTER — Telehealth: Payer: Self-pay | Admitting: Internal Medicine

## 2020-09-13 ENCOUNTER — Ambulatory Visit (INDEPENDENT_AMBULATORY_CARE_PROVIDER_SITE_OTHER): Payer: Medicare HMO | Admitting: Orthopaedic Surgery

## 2020-09-13 ENCOUNTER — Encounter: Payer: Self-pay | Admitting: Orthopaedic Surgery

## 2020-09-13 DIAGNOSIS — G8929 Other chronic pain: Secondary | ICD-10-CM | POA: Diagnosis not present

## 2020-09-13 DIAGNOSIS — M25561 Pain in right knee: Secondary | ICD-10-CM | POA: Diagnosis not present

## 2020-09-13 DIAGNOSIS — Z96651 Presence of right artificial knee joint: Secondary | ICD-10-CM

## 2020-09-13 DIAGNOSIS — R6889 Other general symptoms and signs: Secondary | ICD-10-CM | POA: Diagnosis not present

## 2020-09-13 NOTE — Progress Notes (Signed)
The patient is now between 74 and 79-month status post revision arthroplasty of a failed right total knee arthroplasty.  There was aseptic loosening of that knee replacement was done elsewhere.  In October 2020 we revised all the components.  He was doing well for long period time but recently has developed some knee pain.  He said he was doing well after water aerobics but then stopped water aerobics.  Examination of his right knee shows no redness.  There is minimal fluid on the knee.  There is good range of motion overall it feels stable.  We did x-ray the knee 2 weeks ago and it showed no complicating features the revision arthroplasty.  I do feel its appropriate for him to get back in his water aerobics to work on strengthening and mobility in general.  We can see him back in 3 months to see how is doing overall.  I would like a repeat AP and lateral of the right knee at that visit.

## 2020-09-13 NOTE — Telephone Encounter (Signed)
Er referral  ?

## 2020-09-14 NOTE — Telephone Encounter (Signed)
Ok to schedule new pt ov.  

## 2020-09-14 NOTE — Telephone Encounter (Signed)
Patient called and scheduled.

## 2020-09-15 ENCOUNTER — Telehealth: Payer: Self-pay | Admitting: Family Medicine

## 2020-09-15 NOTE — Telephone Encounter (Signed)
Yes make an appointment ASAP and we can discuss both things and get referrals and order any further imaging that may be needed.

## 2020-09-15 NOTE — Telephone Encounter (Signed)
[place] on 03/07    IMPRESSION: Diffuse increase in liver echogenicity, an appearance indicative of hepatic steatosis. No focal liver lesions evident. Note that the sensitivity of ultrasound for detection of focal liver lesions is diminished in this circumstance.   Study otherwise unremarkable.

## 2020-09-15 NOTE — Telephone Encounter (Signed)
Dr. Louanne Skye,  You do not have any appts until the 18th. Can he be scheduled then?

## 2020-09-15 NOTE — Telephone Encounter (Signed)
Pt had sonogram done at [facility] and was recommended to go to GI doctor. After he left the hospital he was told that he needed another scan done to find out if he is having pain because of his gallbladder.

## 2020-09-16 NOTE — Telephone Encounter (Signed)
Dr. Louanne Skye,  Those times have already been filled. Pt is scheduled for the 17th.

## 2020-09-16 NOTE — Telephone Encounter (Signed)
See if he can come in today at my 355 at 410, those were blocked for meeting that I no longer have and can be opened up today.  Also get them to open up the other slot today so that if somebody needs an appointment today we can fill it.

## 2020-09-16 NOTE — Telephone Encounter (Signed)
Okay; thanks.

## 2020-09-22 ENCOUNTER — Ambulatory Visit: Payer: Medicare HMO | Admitting: Family Medicine

## 2020-09-27 ENCOUNTER — Encounter: Payer: Self-pay | Admitting: Family Medicine

## 2020-09-28 NOTE — Telephone Encounter (Signed)
Called patient no answer no voice mail.  

## 2020-09-28 NOTE — Telephone Encounter (Signed)
Needs appointment for your follow-up because we have not seen for this

## 2020-09-29 ENCOUNTER — Ambulatory Visit (INDEPENDENT_AMBULATORY_CARE_PROVIDER_SITE_OTHER): Payer: Medicare HMO | Admitting: Family Medicine

## 2020-09-29 ENCOUNTER — Other Ambulatory Visit: Payer: Self-pay

## 2020-09-29 ENCOUNTER — Encounter (HOSPITAL_COMMUNITY): Payer: Medicare HMO

## 2020-09-29 ENCOUNTER — Telehealth: Payer: Self-pay

## 2020-09-29 ENCOUNTER — Encounter: Payer: Medicare HMO | Admitting: Vascular Surgery

## 2020-09-29 ENCOUNTER — Encounter: Payer: Self-pay | Admitting: Family Medicine

## 2020-09-29 VITALS — BP 180/93 | HR 86 | Ht 66.0 in | Wt 227.0 lb

## 2020-09-29 DIAGNOSIS — E1159 Type 2 diabetes mellitus with other circulatory complications: Secondary | ICD-10-CM | POA: Diagnosis not present

## 2020-09-29 DIAGNOSIS — E1169 Type 2 diabetes mellitus with other specified complication: Secondary | ICD-10-CM

## 2020-09-29 DIAGNOSIS — E669 Obesity, unspecified: Secondary | ICD-10-CM

## 2020-09-29 DIAGNOSIS — E119 Type 2 diabetes mellitus without complications: Secondary | ICD-10-CM | POA: Diagnosis not present

## 2020-09-29 DIAGNOSIS — E785 Hyperlipidemia, unspecified: Secondary | ICD-10-CM | POA: Diagnosis not present

## 2020-09-29 DIAGNOSIS — I152 Hypertension secondary to endocrine disorders: Secondary | ICD-10-CM | POA: Diagnosis not present

## 2020-09-29 DIAGNOSIS — R1011 Right upper quadrant pain: Secondary | ICD-10-CM

## 2020-09-29 DIAGNOSIS — M792 Neuralgia and neuritis, unspecified: Secondary | ICD-10-CM

## 2020-09-29 LAB — BAYER DCA HB A1C WAIVED: HB A1C (BAYER DCA - WAIVED): 5.8 % (ref <7.0)

## 2020-09-29 MED ORDER — AMLODIPINE BESYLATE 5 MG PO TABS: 5.0000 mg | ORAL_TABLET | Freq: Every day | ORAL | 3 refills | Status: DC

## 2020-09-29 NOTE — Telephone Encounter (Signed)
Pt would like to start on something for weight loss. He and Dettinger got side tracked during appt today and forgot to discuss.  Per Dettinger pt needs to have HIDA scan 1st and figure out stomach issues before starting on a new medication. Pt is scheduled to see GI in April.

## 2020-09-29 NOTE — Progress Notes (Signed)
BP (!) 180/93   Pulse 86   Ht 5\' 6"  (1.676 m)   Wt 227 lb (103 kg)   SpO2 98%   BMI 36.64 kg/m    Subjective:   Patient ID: , male    DOB: 02/20/1961, 60 y.o.   MRN: [id number]  HPI: Alex [name] is a 60 y.o. male presenting on 09/29/2020 for Medical Management of Chronic Issues, Diabetes, and Hypertension   HPI Hypertension Patient is currently on lisinopril, was supposed to start amlodipine but did not, and their blood pressure today is 180/93. Patient denies any lightheadedness or dizziness. Patient denies headaches, blurred vision, chest pains, shortness of breath, or weakness. Denies any side effects from medication and is content with current medication.   Hyperlipidemia Patient is coming in for recheck of his hyperlipidemia. The patient is currently taking Crestor. They deny any issues with myalgias or history of liver damage from it. They deny any focal numbness or weakness or chest pain.   Type 2 diabetes mellitus Patient comes in today for recheck of his diabetes. Patient has been currently taking Metformin and Ozempic, A1c 5.8.10/01/2020 Patient is currently on an ACE inhibitor/ARB. Patient has not seen an ophthalmologist this year. Patient denies any issues with their feet. The symptom started onset as an adult peripheral neuropathy and hypertension cholesterol ARE RELATED TO DM   Right upper quadrant abdominal pain. Patient was in the ED with right upper quadrant abdominal pain a few weeks ago, he still having some issues although is much improved.  He still has some pain up in the right upper quadrant and some issues with food.  He says it comes and goes but is not too bad right now to just mild but is always on the right side, sometimes it comes around to his back when it is really bad.  Relevant past medical, surgical, family and social history reviewed and updated as indicated. Interim medical history since our last visit reviewed. Allergies and medications  reviewed and updated.  Review of Systems  Constitutional: Negative for chills and fever.  Eyes: Negative for visual disturbance.  Respiratory: Negative for shortness of breath and wheezing.   Cardiovascular: Negative for chest pain and leg swelling.  Gastrointestinal: Positive for abdominal distention and abdominal pain. Negative for diarrhea, nausea and vomiting.  Musculoskeletal: Negative for back pain and gait problem.  Skin: Negative for rash.  All other systems reviewed and are negative.   Per HPI unless specifically indicated above   Allergies as of 09/29/2020      Reactions   Morphine And Related Nausea Only   Buprenorphine Hcl-naloxone Hcl Nausea Only   Codeine Nausea Only, Other (See Comments)   per pt, "felt weird in my head"      Medication List       Accurate as of September 29, 2020  4:17 PM. If you have any questions, ask your nurse or doctor.        STOP taking these medications   HYDROcodone-acetaminophen 5-325 MG tablet Commonly known as: NORCO/VICODIN Stopped by: October 01, 2020 Dettinger, MD   metFORMIN 500 MG tablet Commonly known as: GLUCOPHAGE Stopped by: Elige Radon, MD   oxyCODONE-acetaminophen 5-325 MG tablet Commonly known as: Percocet Stopped by: Nils Pyle, MD   pregabalin 75 MG capsule Commonly known as: LYRICA Stopped by: Nils Pyle, MD     TAKE these medications   amLODipine 5 MG tablet Commonly known as: NORVASC Take 1 tablet (5 mg total) by  mouth daily.   diclofenac 75 MG EC tablet Commonly known as: VOLTAREN Take 75 mg by mouth 2 (two) times daily as needed.   diclofenac Sodium 1 % Gel Commonly known as: Voltaren Apply 2 g topically 4 (four) times daily.   dicyclomine 20 MG tablet Commonly known as: BENTYL Take 1 tablet (20 mg total) by mouth 2 (two) times daily.   indomethacin 50 MG capsule Commonly known as: INDOCIN Take 1 capsule (50 mg total) by mouth 2 (two) times daily with a meal.   ketoconazole  2 % shampoo Commonly known as: NIZORAL SHAMPOO, LATHER, LEAVE FOR 10 MINUTES, THEN RINSE. USE 2 TIMES A WEEK FOR UP TO 4 WEEKS What changed: See the new instructions.   lisinopril 40 MG tablet Commonly known as: ZESTRIL Take 1 tablet (40 mg total) by mouth daily.   methocarbamol 500 MG tablet Commonly known as: Robaxin Take 1 tablet (500 mg total) by mouth every 8 (eight) hours as needed for muscle spasms.   omeprazole 20 MG capsule Commonly known as: PRILOSEC Take 1 capsule (20 mg total) by mouth daily.   oxymetazoline 0.05 % nasal spray Commonly known as: AFRIN Place 1 spray into both nostrils every 6 (six) hours as needed for congestion.   Ozempic (0.25 or 0.5 MG/DOSE) 2 MG/1.5ML Sopn Generic drug: Semaglutide(0.25 or 0.5MG /DOS) Inject 0.5 mg into the skin once a week.   rosuvastatin 10 MG tablet Commonly known as: Crestor Take 1 tablet (10 mg total) by mouth daily.        Objective:   BP (!) 180/93   Pulse 86   Ht 5\' 6"  (1.676 m)   Wt 227 lb (103 kg)   SpO2 98%   BMI 36.64 kg/m   Wt Readings from Last 3 Encounters:  09/29/20 227 lb (103 kg)  09/12/20 197 lb 15.6 oz (89.8 kg)  09/11/20 198 lb (89.8 kg)    Physical Exam Vitals and nursing note reviewed.  Constitutional:      General: He is not in acute distress.    Appearance: He is well-developed. He is not diaphoretic.  Eyes:     General: No scleral icterus.    Conjunctiva/sclera: Conjunctivae normal.  Neck:     Thyroid: No thyromegaly.  Cardiovascular:     Rate and Rhythm: Normal rate and regular rhythm.     Heart sounds: Normal heart sounds. No murmur heard.   Pulmonary:     Effort: Pulmonary effort is normal. No respiratory distress.     Breath sounds: Normal breath sounds. No wheezing.  Abdominal:     General: Abdomen is flat. Bowel sounds are normal. There is no distension.     Tenderness: There is abdominal tenderness (Right upper quadrant). There is no right CVA tenderness, left CVA  tenderness, guarding or rebound.     Hernia: No hernia is present.  Musculoskeletal:        General: Normal range of motion.     Cervical back: Neck supple.  Lymphadenopathy:     Cervical: No cervical adenopathy.  Skin:    General: Skin is warm and dry.     Findings: No rash.  Neurological:     Mental Status: He is alert and oriented to person, place, and time.     Coordination: Coordination normal.  Psychiatric:        Behavior: Behavior normal.       Assessment & Plan:   Problem List Items Addressed This Visit      Cardiovascular  and Mediastinum   Hypertension associated with diabetes (HCC)   Relevant Medications   amLODipine (NORVASC) 5 MG tablet     Endocrine   Diabetes mellitus type 2 in obese (HCC)   Hyperlipidemia associated with type 2 diabetes mellitus (HCC)     Other   Neuropathic pain    Other Visit Diagnoses    Type 2 diabetes mellitus without complication, without long-term current use of insulin (HCC)    -  Primary   Relevant Orders   Bayer DCA Hb A1c Waived   RUQ abdominal pain       Relevant Orders   NM Hepato W/Eject Fract      Will order HIDA scan because he has had continued right upper intra-abdominal pain, ultrasound was nondiagnostic.  A1c looks good at 5.8, stop the Metformin, is looking for something for weight loss so will consider increasing Ozempic in the future but for now with his abdominal issues will hold off on that..   Follow up plan: Return in about 3 months (around 12/30/2020), or if symptoms worsen or fail to improve, for Hypertension and diabetes and hyperlipidemia.  Counseling provided for all of the vaccine components Orders Placed This Encounter  Procedures  . NM Hepato W/Eject Fract  . Bayer [facility] Hb A1c Waived    Arville Care, MD [facility] 09/29/2020, 4:17 PM

## 2020-09-30 NOTE — Addendum Note (Signed)
Addended by: Arville Care on: 09/30/2020 07:45 AM   Modules accepted: Orders

## 2020-10-03 NOTE — Telephone Encounter (Signed)
Pt was seen on 09/29/20

## 2020-10-06 ENCOUNTER — Other Ambulatory Visit: Payer: Self-pay | Admitting: Family Medicine

## 2020-10-06 DIAGNOSIS — L219 Seborrheic dermatitis, unspecified: Secondary | ICD-10-CM

## 2020-10-10 ENCOUNTER — Ambulatory Visit (HOSPITAL_COMMUNITY)
Admission: RE | Admit: 2020-10-10 | Discharge: 2020-10-10 | Disposition: A | Discharge status: Home / Self Care | Payer: Medicare HMO | Source: Ambulatory Visit | Attending: Family Medicine | Admitting: Family Medicine

## 2020-10-10 DIAGNOSIS — R1011 Right upper quadrant pain: Secondary | ICD-10-CM | POA: Insufficient documentation

## 2020-10-10 DIAGNOSIS — R109 Unspecified abdominal pain: Secondary | ICD-10-CM | POA: Diagnosis not present

## 2020-10-11 ENCOUNTER — Other Ambulatory Visit: Payer: Self-pay | Admitting: Family Medicine

## 2020-10-11 ENCOUNTER — Other Ambulatory Visit: Payer: Self-pay

## 2020-10-11 ENCOUNTER — Encounter: Payer: Self-pay | Admitting: Gastroenterology

## 2020-10-11 ENCOUNTER — Ambulatory Visit: Payer: Medicare HMO | Admitting: Gastroenterology

## 2020-10-11 ENCOUNTER — Telehealth: Payer: Self-pay

## 2020-10-11 DIAGNOSIS — R6889 Other general symptoms and signs: Secondary | ICD-10-CM | POA: Diagnosis not present

## 2020-10-11 DIAGNOSIS — R1011 Right upper quadrant pain: Secondary | ICD-10-CM | POA: Diagnosis not present

## 2020-10-11 MED ORDER — PANTOPRAZOLE SODIUM 40 MG PO TBEC: 40.0000 mg | DELAYED_RELEASE_TABLET | Freq: Two times a day (BID) | ORAL | 3 refills | Status: DC

## 2020-10-11 NOTE — Telephone Encounter (Signed)
Patient called regarding leg pain he stated he is having pain from his knee to the inside of his the thigh area he stated he cant go to pt due to the pain, patient stated he would like to receive a cortisone injection if Dr.Blackman thinks the injection would help  call back:[phone]

## 2020-10-11 NOTE — Progress Notes (Signed)
Primary Care Physician:  Dettinger, Elige Radon, MD  Primary Gastroenterologist:  Hennie Duos. Marletta Lor, DO   Chief Complaint  Patient presents with  . Abdominal Pain    RUQ and into back. Has [place] yesterday. Scheduled for HIDA tomorrow.    HPI:  Alex [name] is a 60 y.o. male here at the request of the ER for further evaluation of abdominal pain.  Patient seen March 6th and March 7 when he presented with a 3-day history of right upper quadrant pain radiating into the right flank and into the back.  Symptoms associated with nausea.  No vomiting.  Described significant pain at onset.  Does not recall any aggravating factors.  Does not recall any injury.  There was notation in the ER records. Symptoms began after he lifted 2 X 4 several.  Patient denies any specific injury.  He is not aware of any thoracic back issues but he has had spinal fusion of the cervical and lumbar/sacral area.  When patient is having a lot of pain in the right upper quadrant region, seems that meals make it worse.  Sometimes worse with movement.  Continues to have pain at this point but much better than at onset several weeks ago.  Initially was sharp in quality.  Denies any urinary symptoms, hematuria.  He has heartburn intermittently related to certain foods.  No dysphagia.  Bowel movements regular.  No blood in the stool or melena.  Patient admits to Aleve 8 per day, Voltaren pills and gel regularly for knee pain. No asa powders.   Patient states he quit alcohol over 20 years ago.  Consumes a case of Pepsi every couple of days.   Work-up to date includes:  CT abdomen pelvis with contrast September 11, 2020: Diffuse fatty liver, 3 mm nonobstructing renal stone within the posterior aspect of the mid right kidney, sigmoid diverticulosis.  CTA chest September 12, 2020: No evidence of pulmonary emboli, mild atelectatic changes in the left upper lobe.  Right upper quadrant ultrasound September 12, 2020: Hepatic steatosis.  Common bile duct  diameter 4 mm.  No gallstones or pericholecystic fluid.  No gallbladder wall thickening.  No Murphy sign.  Right upper quadrant ultrasound 10/10/2020: Hepatic steatosis.  Common bile duct diameter 3 mm.  No gallstones or pericholecystic fluid.  No gallbladder wall thickening.  No Murphy sign.  Labs September 11, 2020: LFTs normal.  Creatinine 1.02.  Lipase normal.  Hemoglobin 13.2.  White blood cell count 9400.  Platelets 203,000.  D-dimer 1.68, troponin I 20-->23.  No prior egd/tcs   Current Outpatient Medications  Medication Sig Dispense Refill  . amLODipine (NORVASC) 5 MG tablet Take 1 tablet (5 mg total) by mouth daily. 90 tablet 3  . diclofenac (VOLTAREN) 75 MG EC tablet Take 75 mg by mouth 2 (two) times daily as needed.    . diclofenac Sodium (VOLTAREN) 1 % GEL Apply 2 g topically 4 (four) times daily. (Patient taking differently: Apply 2 g topically as needed.) 150 g 1  . ketoconazole (NIZORAL) 2 % shampoo SHAMPOO, LATHER, LEAVE FOR 10 MINUTES, THEN RINSE. USE 2 TIMES A WEEK FOR UP TO 4 WEEKS 120 mL 2  . lisinopril (ZESTRIL) 40 MG tablet Take 1 tablet (40 mg total) by mouth daily. 90 tablet 3  . methocarbamol (ROBAXIN) 500 MG tablet Take 1 tablet (500 mg total) by mouth every 8 (eight) hours as needed for muscle spasms. 90 tablet 1  . oxymetazoline (AFRIN) 0.05 % nasal spray Place 1  spray into both nostrils every 6 (six) hours as needed for congestion.     . Semaglutide,0.25 or 0.5MG /DOS, (OZEMPIC, 0.25 OR 0.5 MG/DOSE,) 2 MG/1.5ML SOPN Inject 0.5 mg into the skin once a week. 1.5 mL 3   No current facility-administered medications for this visit.    Allergies as of 10/11/2020 - Review Complete 10/11/2020  Allergen Reaction Noted  . Morphine and related Nausea Only 12/19/2018  . Buprenorphine hcl-naloxone hcl Nausea Only 05/22/2011  . Codeine Nausea Only and Other (See Comments) 04/12/2016    Past Medical History:  Diagnosis Date  . Diabetes mellitus without complication (HCC)   .  History of kidney stones    removed 5 months ago  . Hypertension   . PONV (postoperative nausea and vomiting)    AGITATION after anesthesia as well  . Sleep apnea    NO CPAP    Past Surgical History:  Procedure Laterality Date  . ANTERIOR CERVICAL DECOMP/DISCECTOMY FUSION N/A 03/27/2018   Procedure: ANTERIOR CERVICAL DECOMPRESSION/DISCECTOMY FUSION C3-5;  Surgeon: Venita Lick, MD;  Location: [facility] OR;  Service: Orthopedics;  Laterality: N/A;  3.5 hrs  . arm surgery Right    x2 involving tendons/ulnar nerve  . BACK SURGERY    . CARPAL TUNNEL RELEASE Right   . clavical surgery    . EYE SURGERY Bilateral    glaucoma  . HERNIA REPAIR    . JOINT REPLACEMENT Right 06/2011  . TOTAL KNEE REVISION Right 04/10/2019   Procedure: RIGHT TOTAL KNEE REVISION;  Surgeon: Kathryne Hitch, MD;  Location: [facility] ORS;  Service: Orthopedics;  Laterality: Right;  . TRICEPS TENDON REPAIR      Family History  Problem Relation Age of Onset  . Cancer Mother 90       small intestinal  . Diabetes Father   . Hypertension Father   . Heart attack Father   . Colon cancer Neg Hx     Social History   Socioeconomic History  . Marital status: Married    Spouse name: Not on file  . Number of children: Not on file  . Years of education: Not on file  . Highest education level: Not on file  Occupational History  . Not on file  Tobacco Use  . Smoking status: Never Smoker  . Smokeless tobacco: Current User    Types: Chew  Vaping Use  . Vaping Use: Never used  Substance and Sexual Activity  . Alcohol use: Not Currently    Comment: Quit EtOH 20 years ago  . Drug use: No  . Sexual activity: Not on file  Other Topics Concern  . Not on file  Social History Narrative  . Not on file   Social Determinants of Health   Financial Resource Strain: Not on file  Food Insecurity: Not on file  Transportation Needs: Not on file  Physical Activity: Not on file  Stress: Not on file  Social Connections: Not  on file  Intimate Partner Violence: Not on file      ROS:  General: Negative for anorexia, weight loss, fever, chills, fatigue, weakness. Eyes: Negative for vision changes.  ENT: Negative for hoarseness, difficulty swallowing , nasal congestion. CV: Negative for chest pain, angina, palpitations, dyspnea on exertion, peripheral edema.  Respiratory: Negative for dyspnea at rest, dyspnea on exertion, cough, sputum, wheezing.  GI: See history of present illness. GU:  Negative for dysuria, hematuria, urinary incontinence, urinary frequency, nocturnal urination.  MS: Positive joint pain, low back pain.  Derm: Negative  for rash or itching.  Neuro: Negative for weakness, abnormal sensation, seizure, frequent headaches, memory loss, confusion.  Psych: Negative for anxiety, depression, suicidal ideation, hallucinations.  Endo: Negative for unusual weight change.  Heme: Negative for bruising or bleeding. Allergy: Negative for rash or hives.    Physical Examination:  BP (!) 166/109   Pulse 92   Temp (!) 97.5 F (36.4 C) (Temporal)   Ht 5\' 6"  (1.676 m)   Wt 230 lb 9.6 oz (104.6 kg)   BMI 37.22 kg/m    General: Well-nourished, well-developed in no acute distress.  Head: Normocephalic, atraumatic.   Eyes: Conjunctiva pink, no icterus. Mouth: masked Neck: Supple without thyromegaly, masses, or lymphadenopathy.  Lungs: Clear to auscultation bilaterally.  Heart: Regular rate and rhythm, no murmurs rubs or gallops.  Abdomen: Bowel sounds are normal,  nondistended, no hepatosplenomegaly or masses, no abdominal bruits or    hernia , no rebound or guarding.  Mild ruq tenderness, no obvious Murphy sign Rectal: not performed Extremities: No lower extremity edema. No clubbing or deformities.  Neuro: Alert and oriented x 4 , grossly normal neurologically.  Skin: Warm and dry, no rash or jaundice.   Psych: Alert and cooperative, normal mood and affect.  Labs: Lab Results  Component Value Date    CREATININE 1.02 09/11/2020   BUN 18 09/11/2020   NA 140 09/11/2020   K 3.5 09/11/2020   CL 105 09/11/2020   CO2 23 09/11/2020   Lab Results  Component Value Date   ALT 38 09/11/2020   AST 27 09/11/2020   ALKPHOS 62 09/11/2020   BILITOT 0.5 09/11/2020   Lab Results  Component Value Date   WBC 9.4 09/11/2020   HGB 13.2 09/11/2020   HCT 39.5 09/11/2020   MCV 87.6 09/11/2020   PLT 203 09/11/2020   Lab Results  Component Value Date   LIPASE 45 09/11/2020   Lab Results  Component Value Date   HGBA1C 5.8 09/29/2020     Imaging Studies: CT Angio Chest PE W and/or Wo Contrast  Result Date: 09/12/2020 CLINICAL DATA:  Right-sided chest pain EXAM: CT ANGIOGRAPHY CHEST WITH CONTRAST TECHNIQUE: Multidetector CT imaging of the chest was performed using the standard protocol during bolus administration of intravenous contrast. Multiplanar CT image reconstructions and MIPs were obtained to evaluate the vascular anatomy. CONTRAST:  100mL OMNIPAQUE IOHEXOL 350 MG/ML SOLN COMPARISON:  Chest x-ray from the previous day. FINDINGS: Cardiovascular: Thoracic aorta demonstrates atherosclerotic calcifications without aneurysmal dilatation or dissection. No cardiac enlargement is seen. Mild coronary calcifications are noted. The pulmonary artery as visualized shows a normal branching pattern. No filling defect to suggest pulmonary embolism is seen. Mediastinum/Nodes: Thoracic inlet is within normal limits. No sizable hilar or mediastinal adenopathy is noted. The esophagus as visualized is within normal limits. Lungs/Pleura: Lungs are well aerated bilaterally. No focal infiltrate or sizable effusion is seen. Minimal atelectatic changes are noted in the left upper lobe. Upper Abdomen: No acute abnormality. Musculoskeletal: Multiple old healed rib fractures are noted. No acute compression deformities are seen. Degenerative change of the thoracic spine is noted. Review of the MIP images confirms the above  findings. IMPRESSION: No evidence of pulmonary emboli. Mild atelectatic changes in the left upper lobe. Aortic Atherosclerosis (ICD10-I70.0). Electronically Signed   By: Alcide CleverMark  Lukens M.D.   On: 09/12/2020 00:38   CT ABDOMEN PELVIS W CONTRAST  Result Date: 09/11/2020 CLINICAL DATA:  Right upper quadrant pain. EXAM: CT ABDOMEN AND PELVIS WITH CONTRAST TECHNIQUE: Multidetector CT imaging  of the abdomen and pelvis was performed using the standard protocol following bolus administration of intravenous contrast. CONTRAST:  OMNIPAQUE IOHEXOL 300 MG/ML  SOLN COMPARISON:  None. FINDINGS: Lower chest: A chronic seventh left rib deformity is seen. Hepatobiliary: There is diffuse fatty infiltration of the liver parenchyma. No focal liver abnormality is seen. No gallstones, gallbladder wall thickening, or biliary dilatation. Pancreas: Unremarkable. No pancreatic ductal dilatation or surrounding inflammatory changes. Spleen: Normal in size without focal abnormality. Adrenals/Urinary Tract: Adrenal glands are unremarkable. Kidneys are normal, without obstructing renal calculi, focal lesion, or hydronephrosis. A 3 mm nonobstructing renal stone is seen within the posterior aspect of the mid right kidney. Bladder is unremarkable. Stomach/Bowel: Stomach is within normal limits. Appendix appears normal. No evidence of bowel wall thickening, distention, or inflammatory changes. Noninflamed diverticula are seen within the proximal sigmoid colon. Vascular/Lymphatic: Aortic atherosclerosis. No enlarged abdominal or pelvic lymph nodes. Reproductive: Prostate is unremarkable. Other: No abdominal wall hernia or abnormality. No abdominopelvic ascites. Musculoskeletal: Multiple metallic density surgical screws are seen extending across the left sacroiliac joint. A chronic deformity is seen involving the superior endplate of the T12 vertebral body. Multilevel degenerative changes are seen throughout the lumbar spine. IMPRESSION: 1.  Hepatic steatosis. 2. 3 mm nonobstructing renal stone within the right kidney. 3. Sigmoid diverticulosis. 4. Aortic atherosclerosis. Aortic Atherosclerosis (ICD10-I70.0). Electronically Signed   By: Aram Candela M.D.   On: 09/11/2020 21:41   US Abdomen Limited  Result Date: 09/12/2020 CLINICAL DATA:  Upper abdominal pain EXAM: ULTRASOUND ABDOMEN LIMITED RIGHT UPPER QUADRANT COMPARISON:  CT abdomen and pelvis September 11, 2020 FINDINGS: Gallbladder: No gallstones or wall thickening visualized. There is no pericholecystic fluid. No sonographic Murphy sign noted by sonographer. Common bile duct: Diameter: 4 mm. No intrahepatic or extrahepatic biliary duct dilatation. Liver: No focal lesion identified. Liver echogenicity is increased diffusely. Portal vein is patent on color Doppler imaging with normal direction of blood flow towards the liver. Other: None. IMPRESSION: Diffuse increase in liver echogenicity, an appearance indicative of hepatic steatosis. No focal liver lesions evident. Note that the sensitivity of ultrasound for detection of focal liver lesions is diminished in this circumstance. Study otherwise unremarkable. Electronically Signed   By: Bretta Bang III M.D.   On: 09/12/2020 16:48   DG Chest Portable 1 View  Result Date: 09/11/2020 CLINICAL DATA:  Right upper abdominal pain. EXAM: PORTABLE CHEST 1 VIEW COMPARISON:  None. FINDINGS: There is no evidence of acute infiltrate, pleural effusion or pneumothorax. The heart size and mediastinal contours are within normal limits. There is tortuosity of the descending thoracic aorta. A radiopaque fixation plate and screws are seen along the mid and distal portion of the left clavicle. Chronic third, fourth and seventh left rib fractures are seen. IMPRESSION: No acute cardiopulmonary disease. Electronically Signed   By: Aram Candela M.D.   On: 09/11/2020 22:49   US ABDOMEN LIMITED RUQ (LIVER/GB)  Result Date: 10/10/2020 CLINICAL DATA:  Colicky  right upper quadrant abdominal pain EXAM: ULTRASOUND ABDOMEN LIMITED RIGHT UPPER QUADRANT COMPARISON:  Ultrasound abdomen limited 09/12/2020 FINDINGS: Gallbladder: No gallstones or wall thickening visualized. No sonographic Murphy sign noted by sonographer. Common bile duct: Diameter: 3 mm Liver: No focal lesion. Diffusely increased parenchymal echogenicity. Portal vein is patent on color Doppler imaging with normal direction of blood flow towards the liver. Other: None. IMPRESSION: Diffuse increased echogenicity of the hepatic parenchyma is a nonspecific indicator of hepatocellular dysfunction, most commonly steatosis. Electronically Signed   By: Mauri Reading  Mir M.D.   On: 10/10/2020 09:24   Assessment/plan:  60 year old male with history of diabetes, hypertension presenting for further evaluation of right upper quadrant abdominal pain occurring for 1 month.  Right upper quadrant pain: Acute onset about 4 weeks ago.  Severe pain.  Prompted ER evaluation. Patient unable to tell me the specifics about possible aggravating factors today.  He does not recall whether pain occurred after any physical activity or eating.  He does note that now when he is having significant pain, seems to be worse after meals.  Pain radiates around the flank and into the back.  No associated bowel issues.  Right upper quadrant ultrasound x2, no gallstones or acute cholecystitis.  CT abdomen pelvis with contrast as outlined.  Pain has somewhat improved over the past couple of weeks.  Differential includes musculoskeletal, gastritis/peptic ulcer disease in the setting of significant NSAID use, less likely biliary.  1. Encouraged him to decrease NSAID use, at minimum take no more than her prescription or OTC package instructions.     2. Add pantoprazole 40 mg twice daily before meals. 3. Upper endoscopy with propofol.  ASA 2.  I have discussed the risks, alternatives, benefits with regards to but not limited to the risk of reaction to  medication, bleeding, infection, perforation and the patient is agreeable to proceed. Written consent to be obtained. 4. Complete HIDA scan is scheduled for tomorrow. 5. If work-up remains unremarkable, he may require further imaging via MRI thoracic spine.

## 2020-10-11 NOTE — Progress Notes (Deleted)
  The note originally documented on this encounter has been moved the the encounter in which it belongs.  

## 2020-10-11 NOTE — Telephone Encounter (Signed)
We would have to set him up for an appt and see him for this issue

## 2020-10-11 NOTE — Patient Instructions (Addendum)
1. Start pantoprazole 40mg  twice daily before food for possible ulcer. 2. Complete HIDA tomorrow. 3. Upper endoscopy as scheduled. See separate instructions.

## 2020-10-12 ENCOUNTER — Other Ambulatory Visit: Payer: Self-pay | Admitting: Family Medicine

## 2020-10-12 ENCOUNTER — Encounter (HOSPITAL_COMMUNITY): Payer: Self-pay

## 2020-10-12 ENCOUNTER — Encounter (HOSPITAL_COMMUNITY)
Admission: RE | Admit: 2020-10-12 | Discharge: 2020-10-12 | Disposition: A | Discharge status: Home / Self Care | Payer: Medicare HMO | Source: Ambulatory Visit | Attending: Family Medicine | Admitting: Family Medicine

## 2020-10-12 DIAGNOSIS — R1011 Right upper quadrant pain: Secondary | ICD-10-CM | POA: Diagnosis not present

## 2020-10-12 DIAGNOSIS — R1010 Upper abdominal pain, unspecified: Secondary | ICD-10-CM | POA: Diagnosis not present

## 2020-10-12 MED ORDER — TECHNETIUM TC 99M MEBROFENIN IV KIT
5.0000 | PACK | Freq: Once | INTRAVENOUS | Status: AC | PRN
  Administered 2020-10-12: 5.2 via INTRAVENOUS

## 2020-10-12 MED ORDER — DICYCLOMINE HCL 10 MG PO CAPS: 10.0000 mg | ORAL_CAPSULE | Freq: Three times a day (TID) | ORAL | 2 refills | Status: DC

## 2020-10-12 NOTE — Progress Notes (Unsigned)
Please let the patient know that I sent Bentyl for him

## 2020-10-14 ENCOUNTER — Other Ambulatory Visit: Payer: Self-pay

## 2020-10-14 ENCOUNTER — Other Ambulatory Visit (HOSPITAL_COMMUNITY)
Admission: RE | Admit: 2020-10-14 | Discharge: 2020-10-14 | Disposition: A | Discharge status: Home / Self Care | Payer: Medicare HMO | Source: Ambulatory Visit | Attending: Internal Medicine | Admitting: Internal Medicine

## 2020-10-14 DIAGNOSIS — Z20822 Contact with and (suspected) exposure to covid-19: Secondary | ICD-10-CM | POA: Insufficient documentation

## 2020-10-14 DIAGNOSIS — Z01812 Encounter for preprocedural laboratory examination: Secondary | ICD-10-CM | POA: Insufficient documentation

## 2020-10-14 LAB — SARS CORONAVIRUS 2 (TAT 6-24 HRS): SARS Coronavirus 2: NEGATIVE

## 2020-10-17 ENCOUNTER — Ambulatory Visit (HOSPITAL_COMMUNITY)
Admission: RE | Admit: 2020-10-17 | Discharge: 2020-10-17 | Disposition: A | Discharge status: Home / Self Care | Payer: Medicare HMO | Attending: Internal Medicine | Admitting: Internal Medicine

## 2020-10-17 ENCOUNTER — Encounter (HOSPITAL_COMMUNITY): Payer: Self-pay

## 2020-10-17 ENCOUNTER — Ambulatory Visit (HOSPITAL_COMMUNITY): Payer: Medicare HMO | Admitting: Anesthesiology

## 2020-10-17 ENCOUNTER — Other Ambulatory Visit: Payer: Self-pay

## 2020-10-17 ENCOUNTER — Encounter (HOSPITAL_COMMUNITY)
Admission: RE | Disposition: A | Discharge status: Home / Self Care | Payer: Self-pay | Source: Home / Self Care | Attending: Internal Medicine

## 2020-10-17 DIAGNOSIS — Z791 Long term (current) use of non-steroidal anti-inflammatories (NSAID): Secondary | ICD-10-CM | POA: Diagnosis not present

## 2020-10-17 DIAGNOSIS — R1011 Right upper quadrant pain: Secondary | ICD-10-CM | POA: Insufficient documentation

## 2020-10-17 DIAGNOSIS — Z800 Family history of malignant neoplasm of digestive organs: Secondary | ICD-10-CM | POA: Insufficient documentation

## 2020-10-17 DIAGNOSIS — K2970 Gastritis, unspecified, without bleeding: Secondary | ICD-10-CM

## 2020-10-17 DIAGNOSIS — K2289 Other specified disease of esophagus: Secondary | ICD-10-CM | POA: Diagnosis not present

## 2020-10-17 DIAGNOSIS — K3189 Other diseases of stomach and duodenum: Secondary | ICD-10-CM | POA: Insufficient documentation

## 2020-10-17 DIAGNOSIS — E119 Type 2 diabetes mellitus without complications: Secondary | ICD-10-CM | POA: Insufficient documentation

## 2020-10-17 DIAGNOSIS — Z981 Arthrodesis status: Secondary | ICD-10-CM | POA: Diagnosis not present

## 2020-10-17 DIAGNOSIS — Z885 Allergy status to narcotic agent status: Secondary | ICD-10-CM | POA: Insufficient documentation

## 2020-10-17 DIAGNOSIS — I10 Essential (primary) hypertension: Secondary | ICD-10-CM | POA: Insufficient documentation

## 2020-10-17 DIAGNOSIS — Z79899 Other long term (current) drug therapy: Secondary | ICD-10-CM | POA: Diagnosis not present

## 2020-10-17 DIAGNOSIS — Z96651 Presence of right artificial knee joint: Secondary | ICD-10-CM | POA: Insufficient documentation

## 2020-10-17 DIAGNOSIS — Z7984 Long term (current) use of oral hypoglycemic drugs: Secondary | ICD-10-CM | POA: Insufficient documentation

## 2020-10-17 HISTORY — PX: ESOPHAGOGASTRODUODENOSCOPY (EGD) WITH PROPOFOL: SHX5813

## 2020-10-17 HISTORY — PX: BIOPSY: SHX5522

## 2020-10-17 LAB — GLUCOSE, CAPILLARY: Glucose-Capillary: 102 mg/dL — ABNORMAL HIGH (ref 70–99)

## 2020-10-17 SURGERY — ESOPHAGOGASTRODUODENOSCOPY (EGD) WITH PROPOFOL
Anesthesia: General

## 2020-10-17 MED ORDER — LIDOCAINE HCL (CARDIAC) PF 100 MG/5ML IV SOSY
PREFILLED_SYRINGE | INTRAVENOUS | Status: DC | PRN
  Administered 2020-10-17: 50 mg via INTRAVENOUS

## 2020-10-17 MED ORDER — PROPOFOL 10 MG/ML IV BOLUS
INTRAVENOUS | Status: DC | PRN
  Administered 2020-10-17: 130 mg via INTRAVENOUS
  Administered 2020-10-17: 30 mg via INTRAVENOUS

## 2020-10-17 MED ORDER — LACTATED RINGERS IV SOLN: INTRAVENOUS | Status: DC

## 2020-10-17 NOTE — Discharge Instructions (Addendum)
EGD Discharge instructions Please read the instructions outlined below and refer to this sheet in the next few weeks. These discharge instructions provide you with general information on caring for yourself after you leave the hospital. Your doctor may also give you specific instructions. While your treatment has been planned according to the most current medical practices available, unavoidable complications occasionally occur. If you have any problems or questions after discharge, please call your doctor. ACTIVITY  You may resume your regular activity but move at a slower pace for the next 24 hours.   Take frequent rest periods for the next 24 hours.   Walking will help expel (get rid of) the air and reduce the bloated feeling in your abdomen.   No driving for 24 hours (because of the anesthesia (medicine) used during the test).   You may shower.   Do not sign any important legal documents or operate any machinery for 24 hours (because of the anesthesia used during the test).  NUTRITION  Drink plenty of fluids.   You may resume your normal diet.   Begin with a light meal and progress to your normal diet.   Avoid alcoholic beverages for 24 hours or as instructed by your caregiver.  MEDICATIONS  You may resume your normal medications unless your caregiver tells you otherwise.  WHAT YOU CAN EXPECT TODAY  You may experience abdominal discomfort such as a feeling of fullness or "gas" pains.  FOLLOW-UP  Your doctor will discuss the results of your test with you.  SEEK IMMEDIATE MEDICAL ATTENTION IF ANY OF THE FOLLOWING OCCUR:  Excessive nausea (feeling sick to your stomach) and/or vomiting.   Severe abdominal pain and distention (swelling).   Trouble swallowing.   Temperature over 101 F (37.8 C).   Rectal bleeding or vomiting of blood.   Your EGD revealed a mild amount inflammation in your stomach.  I took biopsies this to rule infection with bacteria called H. pylori.  No  ulcers identified.  Does appear that she may have Barrett's esophagus so I took biopsies of this as well.  Depending on pathology will determine our next course of action.  I did not find any obvious source of your abdominal pain on exam today.  Follow-up with GI in 2 to 3 months.  Office will call with follow up appointment   I hope you have a great rest of your week!  Hennie Duos. Marletta Lor, D.O. Gastroenterology and Hepatology [facility]   Gastritis, Adult  Gastritis is swelling (inflammation) of the stomach. Gastritis can develop quickly (acute). It can also develop slowly over time (chronic). It is important to get help for this condition. If you do not get help, your stomach can bleed, and you can get sores (ulcers) in your stomach. What are the causes? This condition may be caused by:  Germs that get to your stomach.  Drinking too much alcohol.  Medicines you are taking.  Too much acid in the stomach.  A disease of the intestines or stomach.  Stress.  An allergic reaction.  Crohn's disease.  Some cancer treatments (radiation). Sometimes the cause of this condition is not known. What are the signs or symptoms? Symptoms of this condition include:  Pain in your stomach.  A burning feeling in your stomach.  Feeling sick to your stomach (nauseous).  Throwing up (vomiting).  Feeling too full after you eat.  Weight loss.  Bad breath.  Throwing up blood.  Blood in your poop (stool). How is  this diagnosed? This condition may be diagnosed with:  Your medical history and symptoms.  A physical exam.  Tests. These can include: ? Blood tests. ? Stool tests. ? A procedure to look inside your stomach (upper endoscopy). ? A test in which a sample of tissue is taken for testing (biopsy). How is this treated? Treatment for this condition depends on what caused it. You may be given:  Antibiotic medicine, if your condition was caused by  germs.  H2 blockers and similar medicines, if your condition was caused by too much acid. Follow these instructions at home: Medicines  Take over-the-counter and prescription medicines only as told by your doctor.  If you were prescribed an antibiotic medicine, take it as told by your doctor. Do not stop taking it even if you start to feel better. Eating and drinking  Eat small meals often, instead of large meals.  Avoid foods and drinks that make your symptoms worse.  Drink enough fluid to keep your pee (urine) pale yellow.   Alcohol use  Do not drink alcohol if: ? Your doctor tells you not to drink. ? You are pregnant, may be pregnant, or are planning to become pregnant.  If you drink alcohol: ? Limit your use to:  0-1 drink a day for women.  0-2 drinks a day for men. ? Be aware of how much alcohol is in your drink. In the [place] one drink equals one 12 oz bottle of beer (355 mL), one 5 oz glass of wine (148 mL), or one 1 oz glass of hard liquor (44 mL). General instructions  Talk with your doctor about ways to manage stress. You can exercise or do deep breathing, meditation, or yoga.  Do not smoke or use products that have nicotine or tobacco. If you need help quitting, ask your doctor.  Keep all follow-up visits as told by your doctor. This is important. Contact a doctor if:  Your symptoms get worse.  Your symptoms go away and then come back. Get help right away if:  You throw up blood or something that looks like coffee grounds.  You have black or dark red poop.  You throw up any time you try to drink fluids.  Your stomach pain gets worse.  You have a fever.  You do not feel better after one week. Summary  Gastritis is swelling (inflammation) of the stomach.  You must get help for this condition. If you do not get help, your stomach can bleed, and you can get sores (ulcers).  This condition is diagnosed with medical history, physical exam, or  tests.  You can be treated with medicines for germs or medicines to block too much acid in your stomach. This information is not intended to replace advice given to you by your health care provider. Make sure you discuss any questions you have with your health care provider. Document Revised: 11/12/2017 Document Reviewed: 11/12/2017 Elsevier Patient Education  2021 [employer].

## 2020-10-17 NOTE — Op Note (Signed)
[facility] Patient Name: Alex [name] Procedure Date: 10/17/2020 12:16 PM MRN: [id number] Date of Birth: Nov 28, 1960 Attending MD: Elon Alas. Abbey Chatters DO CSN: [id number] Age: 60 Admit Type: Outpatient Procedure:                Upper GI endoscopy Indications:              Abdominal pain in the right upper quadrant Providers:                Elon Alas. Abbey Chatters, DO, Gwenlyn Fudge, RN, Dereck Leep, Technician Referring MD:              Medicines:                See the Anesthesia note for documentation of the                            administered medications Complications:            No immediate complications. Estimated Blood Loss:     Estimated blood loss was minimal. Procedure:                Pre-Anesthesia Assessment:                           - The anesthesia plan was to use monitored                            anesthesia care (MAC).                           After obtaining informed consent, the endoscope was                            passed under direct vision. Throughout the                            procedure, the patient's blood pressure, pulse, and                            oxygen saturations were monitored continuously. The                            GIF-H190 ([id number]) scope was introduced through the                            mouth, and advanced to the second part of duodenum.                            The upper GI endoscopy was accomplished without                            difficulty. The patient tolerated the procedure  well. Scope In: 12:38:01 PM Scope Out: 12:42:59 PM Total Procedure Duration: 0 hours 4 minutes 58 seconds  Findings:      The esophagus and gastroesophageal junction were examined with white       light and narrow band imaging (NBI) from a forward view and retroflexed       position. There were esophageal mucosal changes suspicious for       short-segment Barrett's esophagus.  These changes involved the mucosa at       the upper extent of the gastric folds (41 cm from the incisors)       extending to the Z-line (39 cm from the incisors). One tongue of       salmon-colored mucosa was present. The maximum longitudinal extent of       these esophageal mucosal changes was 2 cm in length. Mucosa was biopsied       with a cold forceps for histology. One specimen bottle was sent to       pathology.      Diffuse mild inflammation characterized by erythema was found in the       entire examined stomach. Biopsies were taken with a cold forceps for       Helicobacter pylori testing.      The duodenal bulb, first portion of the duodenum and second portion of       the duodenum were normal. Impression:               - Esophageal mucosal changes suspicious for                            short-segment Barrett's esophagus. Biopsied.                           - Gastritis. Biopsied.                           - Normal duodenal bulb, first portion of the                            duodenum and second portion of the duodenum. Moderate Sedation:      Per Anesthesia Care Recommendation:           - Patient has a contact number available for                            emergencies. The signs and symptoms of potential                            delayed complications were discussed with the                            patient. Return to normal activities tomorrow.                            Written discharge instructions were provided to the                            patient.                           -  Resume previous diet.                           - Continue present medications.                           - Await pathology results.                           - Repeat upper endoscopy for surveillance based on                            pathology results.                           - Return to GI clinic in 3 months.                           - No identifiable cause of patient's  abdominal pain                            found today. Procedure Code(s):        --- Professional ---                           [phone], Esophagogastroduodenoscopy, flexible,                            transoral; with biopsy, single or multiple Diagnosis Code(s):        --- Professional ---                           K22.8, Other specified diseases of esophagus                           K29.70, Gastritis, unspecified, without bleeding                           R10.11, Right upper quadrant pain CPT copyright 2019 American Medical Association. All rights reserved. The codes documented in this report are preliminary and upon coder review may  be revised to meet current compliance requirements. Elon Alas. Abbey Chatters, DO [place] Abbey Chatters, DO 10/17/2020 12:48:14 PM This report has been signed electronically. Number of Addenda: 0

## 2020-10-17 NOTE — Transfer of Care (Signed)
Immediate Anesthesia Transfer of Care Note  Patient: Alex [name]  Procedure(s) Performed: ESOPHAGOGASTRODUODENOSCOPY (EGD) WITH PROPOFOL (N/A ) BIOPSY  Patient Location: PACU  Anesthesia Type:General  Level of Consciousness: awake and alert   Airway & Oxygen Therapy: Patient Spontanous Breathing  Post-op Assessment: Report given to RN and Post -op Vital signs reviewed and stable  Post vital signs: Reviewed and stable  Last Vitals:  Vitals Value Taken Time  BP    Temp    Pulse 95   Resp 14   SpO2 95     Last Pain:  Vitals:   10/17/20 1234  TempSrc:   PainSc: 7       Patients Stated Pain Goal: 7 (10/17/20 1137)  Complications: No complications documented.

## 2020-10-17 NOTE — Anesthesia Preprocedure Evaluation (Signed)
Anesthesia Evaluation  Patient identified by MRN, date of birth, ID band Patient awake    Reviewed: Allergy & Precautions, NPO status , Patient's Chart, lab work & pertinent test results  History of Anesthesia Complications (+) PONV and history of anesthetic complications  Airway Mallampati: III  TM Distance: >3 FB Neck ROM: Full   Comment: ACDF, NECK PAIN Dental  (+) Dental Advisory Given, Missing, Poor Dentition   Pulmonary sleep apnea ,    Pulmonary exam normal breath sounds clear to auscultation       Cardiovascular Exercise Tolerance: Good hypertension, Pt. on medications Normal cardiovascular exam Rhythm:Regular Rate:Normal     Neuro/Psych    GI/Hepatic   Endo/Other  diabetes, Well Controlled, Type 2, Oral Hypoglycemic Agents  Renal/GU Renal disease     Musculoskeletal  (+) Arthritis  (ACDF),   Abdominal   Peds  Hematology   Anesthesia Other Findings   Reproductive/Obstetrics                             Anesthesia Physical Anesthesia Plan  ASA: III  Anesthesia Plan: General   Post-op Pain Management:    Induction: Intravenous  PONV Risk Score and Plan: Propofol infusion  Airway Management Planned: Natural Airway and Nasal Cannula  Additional Equipment:   Intra-op Plan:   Post-operative Plan:   Informed Consent: I have reviewed the patients History and Physical, chart, labs and discussed the procedure including the risks, benefits and alternatives for the proposed anesthesia with the patient or authorized representative who has indicated his/her understanding and acceptance.     Dental advisory given  Plan Discussed with: CRNA and Surgeon  Anesthesia Plan Comments:         Anesthesia Quick Evaluation

## 2020-10-17 NOTE — Anesthesia Postprocedure Evaluation (Signed)
Anesthesia Post Note  Patient: Nathon Stefanski  Procedure(s) Performed: ESOPHAGOGASTRODUODENOSCOPY (EGD) WITH PROPOFOL (N/A ) BIOPSY  Patient location during evaluation: Endoscopy Anesthesia Type: General Level of consciousness: awake and alert and oriented Pain management: pain level controlled Vital Signs Assessment: post-procedure vital signs reviewed and stable Respiratory status: spontaneous breathing and respiratory function stable Cardiovascular status: blood pressure returned to baseline and stable Postop Assessment: no apparent nausea or vomiting Anesthetic complications: no   No complications documented.   Last Vitals:  Vitals:   10/17/20 1137 10/17/20 1246  BP: (!) 171/107 (!) 146/96  Pulse: 93   Resp: (!) 21 15  Temp: 36.5 C 36.8 C  SpO2: 97% 94%    Last Pain:  Vitals:   10/17/20 1246  TempSrc: Oral  PainSc: 5                  Tierre Gerard C Asmara Backs

## 2020-10-19 ENCOUNTER — Ambulatory Visit (INDEPENDENT_AMBULATORY_CARE_PROVIDER_SITE_OTHER): Payer: Medicare HMO

## 2020-10-19 ENCOUNTER — Ambulatory Visit (INDEPENDENT_AMBULATORY_CARE_PROVIDER_SITE_OTHER): Payer: Medicare HMO | Admitting: Orthopaedic Surgery

## 2020-10-19 VITALS — Ht 66.0 in | Wt 230.6 lb

## 2020-10-19 DIAGNOSIS — M79604 Pain in right leg: Secondary | ICD-10-CM

## 2020-10-19 LAB — SURGICAL PATHOLOGY

## 2020-10-19 MED ORDER — METHYLPREDNISOLONE 4 MG PO TABS: ORAL_TABLET | ORAL | 0 refills | Status: DC

## 2020-10-19 NOTE — Progress Notes (Signed)
Office Visit Note   Patient: Alex [name]           Date of Birth: 1961/02/15           MRN: [id number] Visit Date: 10/19/2020              Requested by: Nils Pyle, MD [address] [place],  [place] [address] PCP: Dettinger, Elige Radon, MD   Assessment & Plan: Visit Diagnoses:  1. Pain in right leg     Plan: I gave him reassurance that there is no arthritis in his hip.  His right knee revision arthroplasty is stable.  He really needs to work on activity modification and weight loss as well as just generalized strengthening activities.  We will is try a 6-day steroid taper given his blood glucose control is good and he will watch his carbohydrate intake.  From my standpoint I will need to see him back for 6 months unless he is having issues.  We can have an AP and lateral of his right knee at that visit.  Follow-Up Instructions: Return in about 6 months (around 04/20/2021).   Orders:  Orders Placed This Encounter  Procedures  . XR Knee 1-2 Views Right  . XR HIP UNILAT W OR W/O PELVIS 1V RIGHT   Meds ordered this encounter  Medications  . methylPREDNISolone (MEDROL) 4 MG tablet    Sig: Medrol dose pack. Take as instructed    Dispense:  21 tablet    Refill:  0      Procedures: No procedures performed   Clinical Data: No additional findings.   Subjective: Chief Complaint  Patient presents with  . Right Knee - Pain  . Right Hip - Pain  The patient comes in today with complaint chief complaint of right hip and thigh pain.  He is actually had a revision arthroplasty of the failed right total knee replacement.  He has a previous SI joint fusion on the left side.  He is someone who is a diabetic but under good control but he is morbidly obese with a BMI of 37.  He states his last hemoglobin A1c was below 6.  HPI  Review of Systems He currently denies any headache, chest pain, shortness of breath, fever, chills, nausea, vomiting  Objective: Vital Signs: Ht 5\' 6"   (1.676 m)   Wt 230 lb 9.6 oz (104.6 kg)   BMI 37.22 kg/m   Physical Exam He is alert and orient x3 and in no acute distress Ortho Exam Examination of his right hip shows full and fluid range of motion of that hip with no blocks to rotation.  There is no pain over the IT band of the trochanteric area.  His right revision knee arthroplasty shows good range of motion.  There is some slight laxity on exam but no effusion. Specialty Comments:  No specialty comments available.  Imaging: XR HIP UNILAT W OR W/O PELVIS 1V RIGHT  Result Date: 10/19/2020 An AP pelvis and lateral of his right hip shows normal-appearing hips bilaterally.  There is no arthritis in either hip that can be seen.  There is a previous left SI joint fusion.  XR Knee 1-2 Views Right  Result Date: 10/19/2020 2 views of the right knee show revision total knee arthroplasty with no complicating features.  A CT scan from March of this year was reviewed of his abdomen pelvis so I could see the hips.  His hip joints are entirely normal and I showed  this to him.  PMFS History: Patient Active Problem List   Diagnosis Date Noted  . RUQ pain 10/11/2020  . Hyperlipidemia associated with type 2 diabetes mellitus (HCC) 06/23/2020  . Chronic neck pain 08/10/2019  . Status post revision of total replacement of right knee 04/10/2019  . Loose right total knee arthroplasty (HCC) 03/09/2019  . Pain in right knee 02/17/2019  . Neuropathic pain 01/01/2019  . Cervical spine degeneration 12/30/2018  . Right wrist pain 12/19/2018  . Right arm numbness 12/19/2018  . Acquired trigger finger of left middle finger 08/22/2018  . Pain of left hand 08/22/2018  . S/P cervical spinal fusion 03/27/2018  . Morbid obesity (HCC) 02/21/2018  . Pain in joint of left shoulder 02/05/2018  . Osteoarthritis of right glenohumeral joint 01/21/2018  . Pain in joint of right shoulder 12/30/2017  . Tear of right rotator cuff 12/17/2017  . Recurrent  nephrolithiasis 09/18/2017  . Right flank pain 08/20/2017  . Drug dependence (HCC) 08/19/2017  . Diabetes mellitus type 2 in obese (HCC) 03/28/2017  . Hypertension associated with diabetes (HCC) 08/25/2016   Past Medical History:  Diagnosis Date  . Diabetes mellitus without complication (HCC)   . History of kidney stones    removed 5 months ago  . Hypertension   . PONV (postoperative nausea and vomiting)    AGITATION after anesthesia as well  . Sleep apnea    NO CPAP    Family History  Problem Relation Age of Onset  . Cancer Mother 12       small intestinal  . Diabetes Father   . Hypertension Father   . Heart attack Father   . Colon cancer Neg Hx     Past Surgical History:  Procedure Laterality Date  . ANTERIOR CERVICAL DECOMP/DISCECTOMY FUSION N/A 03/27/2018   Procedure: ANTERIOR CERVICAL DECOMPRESSION/DISCECTOMY FUSION C3-5;  Surgeon: Venita Lick, MD;  Location: [facility] OR;  Service: Orthopedics;  Laterality: N/A;  3.5 hrs  . arm surgery Right    x2 involving tendons/ulnar nerve  . BACK SURGERY    . CARPAL TUNNEL RELEASE Right   . clavical surgery    . EYE SURGERY Bilateral    glaucoma  . HERNIA REPAIR    . JOINT REPLACEMENT Right 06/2011  . TOTAL KNEE REVISION Right 04/10/2019   Procedure: RIGHT TOTAL KNEE REVISION;  Surgeon: Kathryne Hitch, MD;  Location: [facility] ORS;  Service: Orthopedics;  Laterality: Right;  . TRICEPS TENDON REPAIR     Social History   Occupational History  . Not on file  Tobacco Use  . Smoking status: Never Smoker  . Smokeless tobacco: Current User    Types: Chew  Vaping Use  . Vaping Use: Never used  Substance and Sexual Activity  . Alcohol use: Not Currently    Comment: Quit EtOH 20 years ago  . Drug use: No  . Sexual activity: Not on file

## 2020-10-26 ENCOUNTER — Telehealth: Payer: Self-pay | Admitting: Orthopaedic Surgery

## 2020-10-26 ENCOUNTER — Telehealth: Payer: Self-pay | Admitting: Internal Medicine

## 2020-10-26 MED ORDER — NABUMETONE 750 MG PO TABS: 750.0000 mg | ORAL_TABLET | Freq: Two times a day (BID) | ORAL | 1 refills | Status: AC | PRN

## 2020-10-26 MED ORDER — TIZANIDINE HCL 4 MG PO TABS: 4.0000 mg | ORAL_TABLET | Freq: Three times a day (TID) | ORAL | 1 refills | Status: DC | PRN

## 2020-10-26 MED ORDER — GABAPENTIN 100 MG PO CAPS: 100.0000 mg | ORAL_CAPSULE | Freq: Two times a day (BID) | ORAL | 0 refills | Status: DC

## 2020-10-26 NOTE — Telephone Encounter (Signed)
Patient called stating he is still having a lot of pain in his right leg. Patient said the pain has gotten worse. Patient said he want to know what to do. Patient asked if Dr Magnus Ivan want him to come back in to see him or call him in some other medication for pain? The number to contact patient is [phone]

## 2020-10-26 NOTE — Telephone Encounter (Signed)
Please call patient back regarding his results. [phone]

## 2020-10-26 NOTE — Telephone Encounter (Signed)
Patient aware this was called in for him  

## 2020-10-26 NOTE — Telephone Encounter (Signed)
I sent in 3 different medications to his pharmacy that I want him to try.  We should then see him back in 1 month.

## 2020-10-27 NOTE — Telephone Encounter (Signed)
See result note.  

## 2020-11-03 ENCOUNTER — Telehealth: Payer: Self-pay | Admitting: *Deleted

## 2020-11-03 NOTE — Telephone Encounter (Signed)
PT ASSISTANCE:  Ozempic #5 pens Came in for pt   Pt called and aware to come pick up  In fridge with name on it

## 2020-11-07 ENCOUNTER — Encounter (HOSPITAL_COMMUNITY): Payer: Self-pay | Admitting: Internal Medicine

## 2020-11-07 NOTE — H&P (Signed)
Primary Care Physician:  Dettinger, Elige Radon, MD Primary Gastroenterologist:  Dr. Marletta Lor  Pre-Procedure History & Physical: HPI:  Alex [name] is a 60 y.o. male is here  for an EGD for RUQ pain.    Past Medical History:  Diagnosis Date  . Diabetes mellitus without complication (HCC)   . History of kidney stones    removed 5 months ago  . Hypertension   . PONV (postoperative nausea and vomiting)    AGITATION after anesthesia as well  . Sleep apnea    NO CPAP    Past Surgical History:  Procedure Laterality Date  . ANTERIOR CERVICAL DECOMP/DISCECTOMY FUSION N/A 03/27/2018   Procedure: ANTERIOR CERVICAL DECOMPRESSION/DISCECTOMY FUSION C3-5;  Surgeon: Venita Lick, MD;  Location: [facility] OR;  Service: Orthopedics;  Laterality: N/A;  3.5 hrs  . arm surgery Right    x2 involving tendons/ulnar nerve  . BACK SURGERY    . CARPAL TUNNEL RELEASE Right   . clavical surgery    . EYE SURGERY Bilateral    glaucoma  . HERNIA REPAIR    . JOINT REPLACEMENT Right 06/2011  . TOTAL KNEE REVISION Right 04/10/2019   Procedure: RIGHT TOTAL KNEE REVISION;  Surgeon: Kathryne Hitch, MD;  Location: [facility] ORS;  Service: Orthopedics;  Laterality: Right;  . TRICEPS TENDON REPAIR      Prior to Admission medications   Medication Sig Start Date End Date Taking? Authorizing Provider  amLODipine (NORVASC) 5 MG tablet Take 1 tablet (5 mg total) by mouth daily. 09/29/20  Yes Dettinger, Elige Radon, MD  diclofenac Sodium (VOLTAREN) 1 % GEL Apply 2 g topically 4 (four) times daily. Patient taking differently: Apply 2 g topically daily as needed (Pain). 07/23/19  Yes Dettinger, Elige Radon, MD  ketoconazole (NIZORAL) 2 % shampoo SHAMPOO, LATHER, LEAVE FOR 10 MINUTES, THEN RINSE. USE 2 TIMES A WEEK FOR UP TO 4 WEEKS Patient taking differently: Apply 1 application topically 2 (two) times a week. SHAMPOO, LATHER, LEAVE FOR 10 MINUTES, THEN RINSE. USE 2 TIMES A WEEK FOR UP TO 4 WEEKS 10/06/20  Yes Dettinger, Elige Radon, MD   lisinopril (ZESTRIL) 40 MG tablet Take 1 tablet (40 mg total) by mouth daily. 11/26/19  Yes Dettinger, Elige Radon, MD  oxymetazoline (AFRIN) 0.05 % nasal spray Place 1 spray into both nostrils every 6 (six) hours as needed for congestion.    Yes [provider]  pantoprazole (PROTONIX) 40 MG tablet Take 1 tablet (40 mg total) by mouth 2 (two) times daily before a meal. 10/11/20  Yes Tiffany Kocher, PA-C  rosuvastatin (CRESTOR) 10 MG tablet Take 10 mg by mouth daily.   Yes [provider]  Semaglutide,0.25 or 0.5MG /DOS, (OZEMPIC, 0.25 OR 0.5 MG/DOSE,) 2 MG/1.5ML SOPN Inject 0.5 mg into the skin once a week. Patient taking differently: Inject 0.5 mg into the skin every Wednesday. 04/06/20  Yes Dettinger, Elige Radon, MD  dicyclomine (BENTYL) 10 MG capsule Take 1 capsule (10 mg total) by mouth 4 (four) times daily -  before meals and at bedtime. 10/12/20   Dettinger, Elige Radon, MD  gabapentin (NEURONTIN) 100 MG capsule Take 1 capsule (100 mg total) by mouth 2 (two) times daily. 10/26/20   Kathryne Hitch, MD  methylPREDNISolone (MEDROL) 4 MG tablet Medrol dose pack. Take as instructed 10/19/20   Kathryne Hitch, MD  nabumetone (RELAFEN) 750 MG tablet Take 1 tablet (750 mg total) by mouth 2 (two) times daily as needed. 10/26/20   Kathryne Hitch, MD  tiZANidine (ZANAFLEX) 4 MG tablet Take 1 tablet (4 mg total) by mouth every 8 (eight) hours as needed for muscle spasms. 10/26/20   Kathryne Hitch, MD    Allergies as of 10/11/2020 - Review Complete 10/11/2020  Allergen Reaction Noted  . Morphine and related Nausea Only 12/19/2018  . Buprenorphine hcl-naloxone hcl Nausea Only 05/22/2011  . Codeine Nausea Only and Other (See Comments) 04/12/2016    Family History  Problem Relation Age of Onset  . Cancer Mother 36       small intestinal  . Diabetes Father   . Hypertension Father   . Heart attack Father   . Colon cancer Neg Hx     Social History    Socioeconomic History  . Marital status: Married    Spouse name: Not on file  . Number of children: Not on file  . Years of education: Not on file  . Highest education level: Not on file  Occupational History  . Not on file  Tobacco Use  . Smoking status: Never Smoker  . Smokeless tobacco: Current User    Types: Chew  Vaping Use  . Vaping Use: Never used  Substance and Sexual Activity  . Alcohol use: Not Currently    Comment: Quit EtOH 20 years ago  . Drug use: No  . Sexual activity: Not on file  Other Topics Concern  . Not on file  Social History Narrative  . Not on file   Social Determinants of Health   Financial Resource Strain: Not on file  Food Insecurity: Not on file  Transportation Needs: Not on file  Physical Activity: Not on file  Stress: Not on file  Social Connections: Not on file  Intimate Partner Violence: Not on file    Review of Systems: See HPI, otherwise negative ROS  Physical Exam: Vital signs in last 24 hours:     General:   Alert,  Well-developed, well-nourished, pleasant and cooperative in NAD Head:  Normocephalic and atraumatic. Eyes:  Sclera clear, no icterus.   Conjunctiva pink. Ears:  Normal auditory acuity. Nose:  No deformity, discharge,  or lesions. Mouth:  No deformity or lesions, dentition normal. Neck:  Supple; no masses or thyromegaly. Lungs:  Clear throughout to auscultation.   No wheezes, crackles, or rhonchi. No acute distress. Heart:  Regular rate and rhythm; no murmurs, clicks, rubs,  or gallops. Abdomen:  Soft, nontender and nondistended. No masses, hepatosplenomegaly or hernias noted. Normal bowel sounds, without guarding, and without rebound.   Msk:  Symmetrical without gross deformities. Normal posture. Extremities:  Without clubbing or edema. Neurologic:  Alert and  oriented x4;  grossly normal neurologically. Skin:  Intact without significant lesions or rashes. Cervical Nodes:  No significant cervical  adenopathy. Psych:  Alert and cooperative. Normal mood and affect.  Impression/Plan: Alex [name] is here for an EGD for RUQ pain.   The risks of the procedure including infection, bleed, or perforation as well as benefits, limitations, alternatives and imponderables have been reviewed with the patient. Questions have been answered. All parties agreeable.

## 2020-12-08 ENCOUNTER — Telehealth: Payer: Self-pay | Admitting: Family Medicine

## 2020-12-08 ENCOUNTER — Encounter: Payer: Self-pay | Admitting: Internal Medicine

## 2020-12-08 ENCOUNTER — Ambulatory Visit (INDEPENDENT_AMBULATORY_CARE_PROVIDER_SITE_OTHER): Payer: Medicare HMO | Admitting: Internal Medicine

## 2020-12-08 VITALS — BP 161/98 | HR 84 | Temp 97.1°F | Ht 66.0 in | Wt 234.2 lb

## 2020-12-08 DIAGNOSIS — Z6837 Body mass index (BMI) 37.0-37.9, adult: Secondary | ICD-10-CM

## 2020-12-08 DIAGNOSIS — R140 Abdominal distension (gaseous): Secondary | ICD-10-CM

## 2020-12-08 DIAGNOSIS — E669 Obesity, unspecified: Secondary | ICD-10-CM | POA: Diagnosis not present

## 2020-12-08 DIAGNOSIS — Z1211 Encounter for screening for malignant neoplasm of colon: Secondary | ICD-10-CM | POA: Diagnosis not present

## 2020-12-08 DIAGNOSIS — K219 Gastro-esophageal reflux disease without esophagitis: Secondary | ICD-10-CM

## 2020-12-08 MED ORDER — SIMETHICONE 125 MG PO TABS: 125.0000 mg | ORAL_TABLET | Freq: Three times a day (TID) | ORAL | 5 refills | Status: DC

## 2020-12-08 NOTE — Progress Notes (Signed)
Referring Provider: Dettinger, Elige Radon, MD Primary Care Physician:  Dettinger, Elige Radon, MD Primary GI:  Dr. Marletta Lor  Chief Complaint  Patient presents with  . abdominal burning    Gets better after eating  . Gas  . Bloated    HPI:   Alex [name] is a 60 y.o. male who presents to the clinic today for procedure follow-up visit.  Recently underwent EGD for right upper quadrant pain which was unremarkable besides mild gastritis, biopsies negative for H. pylori.  Patient states his pain has resolved.  He is complaining to me about bloating today.  Also notes reflux.  States he has not been taking his pantoprazole.  Previously prescribed dicyclomine which he also states he stopped.  Does think it helps some.  No melena hematochezia.  No previous colonoscopy.  States he used to do stool cards but has not done those in some time.  Is interested in having a screening colonoscopy.  Past Medical History:  Diagnosis Date  . Diabetes mellitus without complication (HCC)   . History of kidney stones    removed 5 months ago  . Hypertension   . PONV (postoperative nausea and vomiting)    AGITATION after anesthesia as well  . Sleep apnea    NO CPAP    Past Surgical History:  Procedure Laterality Date  . ANTERIOR CERVICAL DECOMP/DISCECTOMY FUSION N/A 03/27/2018   Procedure: ANTERIOR CERVICAL DECOMPRESSION/DISCECTOMY FUSION C3-5;  Surgeon: Venita Lick, MD;  Location: [facility] OR;  Service: Orthopedics;  Laterality: N/A;  3.5 hrs  . arm surgery Right    x2 involving tendons/ulnar nerve  . BACK SURGERY    . BIOPSY  10/17/2020   Procedure: BIOPSY;  Surgeon: Lanelle Bal, DO;  Location: [facility] SUITE;  Service: Endoscopy;;  . CARPAL TUNNEL RELEASE Right   . clavical surgery    . ESOPHAGOGASTRODUODENOSCOPY (EGD) WITH PROPOFOL N/A 10/17/2020   Procedure: ESOPHAGOGASTRODUODENOSCOPY (EGD) WITH PROPOFOL;  Surgeon: Lanelle Bal, DO;  Location: [facility];  Service: Endoscopy;  Laterality:  N/A;  PM  . EYE SURGERY Bilateral    glaucoma  . HERNIA REPAIR    . JOINT REPLACEMENT Right 06/2011  . TOTAL KNEE REVISION Right 04/10/2019   Procedure: RIGHT TOTAL KNEE REVISION;  Surgeon: Kathryne Hitch, MD;  Location: [facility] ORS;  Service: Orthopedics;  Laterality: Right;  . TRICEPS TENDON REPAIR      Current Outpatient Medications  Medication Sig Dispense Refill  . amLODipine (NORVASC) 5 MG tablet Take 1 tablet (5 mg total) by mouth daily. 90 tablet 3  . diclofenac Sodium (VOLTAREN) 1 % GEL Apply 2 g topically 4 (four) times daily. (Patient taking differently: Apply 2 g topically daily as needed (Pain).) 150 g 1  . ketoconazole (NIZORAL) 2 % shampoo SHAMPOO, LATHER, LEAVE FOR 10 MINUTES, THEN RINSE. USE 2 TIMES A WEEK FOR UP TO 4 WEEKS (Patient taking differently: Apply 1 application topically 2 (two) times a week. SHAMPOO, LATHER, LEAVE FOR 10 MINUTES, THEN RINSE. USE 2 TIMES A WEEK FOR UP TO 4 WEEKS) 120 mL 2  . lisinopril (ZESTRIL) 40 MG tablet Take 1 tablet (40 mg total) by mouth daily. 90 tablet 3  . oxymetazoline (AFRIN) 0.05 % nasal spray Place 1 spray into both nostrils every 6 (six) hours as needed for congestion.     . rosuvastatin (CRESTOR) 10 MG tablet Take 10 mg by mouth daily.    . Semaglutide,0.25 or 0.5MG /DOS, (OZEMPIC, 0.25 OR 0.5 MG/DOSE,) 2  MG/1.5ML SOPN Inject 0.5 mg into the skin once a week. (Patient taking differently: Inject 0.5 mg into the skin every Wednesday.) 1.5 mL 3  . tiZANidine (ZANAFLEX) 4 MG tablet Take 1 tablet (4 mg total) by mouth every 8 (eight) hours as needed for muscle spasms. 40 tablet 1  . dicyclomine (BENTYL) 10 MG capsule Take 1 capsule (10 mg total) by mouth 4 (four) times daily -  before meals and at bedtime. (Patient not taking: Reported on 12/08/2020) 90 capsule 2  . gabapentin (NEURONTIN) 100 MG capsule Take 1 capsule (100 mg total) by mouth 2 (two) times daily. (Patient not taking: Reported on 12/08/2020) 60 capsule 0  . methylPREDNISolone  (MEDROL) 4 MG tablet Medrol dose pack. Take as instructed (Patient not taking: Reported on 12/08/2020) 21 tablet 0  . nabumetone (RELAFEN) 750 MG tablet Take 1 tablet (750 mg total) by mouth 2 (two) times daily as needed. (Patient not taking: Reported on 12/08/2020) 60 tablet 1  . pantoprazole (PROTONIX) 40 MG tablet Take 1 tablet (40 mg total) by mouth 2 (two) times daily before a meal. (Patient not taking: Reported on 12/08/2020) 60 tablet 3   No current facility-administered medications for this visit.    Allergies as of 12/08/2020 - Review Complete 12/08/2020  Allergen Reaction Noted  . Morphine and related Nausea Only 12/19/2018  . Buprenorphine hcl-naloxone hcl Nausea Only 05/22/2011  . Codeine Nausea Only and Other (See Comments) 04/12/2016    Family History  Problem Relation Age of Onset  . Cancer Mother 41       small intestinal  . Diabetes Father   . Hypertension Father   . Heart attack Father   . Colon cancer Neg Hx     Social History   Socioeconomic History  . Marital status: Married    Spouse name: Not on file  . Number of children: Not on file  . Years of education: Not on file  . Highest education level: Not on file  Occupational History  . Not on file  Tobacco Use  . Smoking status: Never Smoker  . Smokeless tobacco: Current User    Types: Chew  Vaping Use  . Vaping Use: Never used  Substance and Sexual Activity  . Alcohol use: Not Currently    Comment: Quit EtOH 20 years ago  . Drug use: No  . Sexual activity: Not on file  Other Topics Concern  . Not on file  Social History Narrative  . Not on file   Social Determinants of Health   Financial Resource Strain: Not on file  Food Insecurity: Not on file  Transportation Needs: Not on file  Physical Activity: Not on file  Stress: Not on file  Social Connections: Not on file    Subjective: Review of Systems  Constitutional: Negative for chills and fever.  HENT: Negative for congestion and hearing  loss.   Eyes: Negative for blurred vision and double vision.  Respiratory: Negative for cough and shortness of breath.   Cardiovascular: Negative for chest pain and palpitations.  Gastrointestinal: Positive for heartburn. Negative for abdominal pain, blood in stool, constipation, diarrhea, melena and vomiting.       Bloating  Genitourinary: Negative for dysuria and urgency.  Musculoskeletal: Negative for joint pain and myalgias.  Skin: Negative for itching and rash.  Neurological: Negative for dizziness and headaches.  Psychiatric/Behavioral: Negative for depression. The patient is not nervous/anxious.      Objective: BP (!) 161/98   Pulse 84  Temp (!) 97.1 F (36.2 C) (Temporal)   Ht 5\' 6"  (1.676 m)   Wt 234 lb 3.2 oz (106.2 kg)   BMI 37.80 kg/m  Physical Exam Constitutional:      Appearance: Normal appearance.  HENT:     Head: Normocephalic and atraumatic.  Eyes:     Extraocular Movements: Extraocular movements intact.     Conjunctiva/sclera: Conjunctivae normal.  Cardiovascular:     Rate and Rhythm: Normal rate and regular rhythm.  Pulmonary:     Effort: Pulmonary effort is normal.     Breath sounds: Normal breath sounds.  Abdominal:     General: Bowel sounds are normal.     Palpations: Abdomen is soft.  Musculoskeletal:        General: Normal range of motion.     Cervical back: Normal range of motion and neck supple.  Skin:    General: Skin is warm.  Neurological:     General: No focal deficit present.     Mental Status: He is alert and oriented to person, place, and time.  Psychiatric:        Mood and Affect: Mood normal.        Behavior: Behavior normal.      Assessment: *GERD *Bloating *Obesity *Colon cancer screening   Plan: Patient's right upper quadrant pain has resolved.  Recommended he restart his Protonix once daily 30 minutes before breakfast.  He can take dicyclomine as needed for breakthrough symptoms of abdominal pain if this  recurs.  For his bloating I will send in simethicone to take 5 to 10 minutes before every meal.  Discussed weight loss in depth with patient today.  Also recommended he decrease the amount of soda he drinks.  He currently drinks a case every 2 to 3 days.  Will schedule for screening colonoscopy.The risks including infection, bleed, or perforation as well as benefits, limitations, alternatives and imponderables have been reviewed with the patient. Questions have been answered. All parties agreeable.   12/08/2020 8:36 AM   Disclaimer: This note was dictated with voice recognition software. Similar sounding words can inadvertently be transcribed and may not be corrected upon review.

## 2020-12-08 NOTE — Telephone Encounter (Signed)
Pt calling to let Dr Dettinger know that his stomach and gallbladder are both good. He would like to know what the next steps will be for him for him to start on the medicine for weight loss.

## 2020-12-08 NOTE — Patient Instructions (Signed)
Glad to hear your abdominal pain is better.  Recommend restarting your pantoprazole once daily 30 minutes before breakfast.  I am going to send you in a new medication called simethicone.  I want you to chew 1 tablet 5 minutes before each meal.  We will schedule you for colonoscopy for colon cancer screening purposes.  Further recommendations to follow.  At [facility] we value your feedback. You may receive a survey about your visit today. Please share your experience as we strive to create trusting relationships with our patients to provide genuine, compassionate, quality care.  We appreciate your understanding and patience as we review any laboratory studies, imaging, and other diagnostic tests that are ordered as we care for you. Our office policy is 5 business days for review of these results, and any emergent or urgent results are addressed in a timely manner for your best interest. If you do not hear from our office in 1 week, please contact us.   We also encourage the use of MyChart, which contains your medical information for your review as well. If you are not enrolled in this feature, an access code is on this after visit summary for your convenience. Thank you for allowing [place] to be involved in your care.  It was great to see you today!  I hope you have a great rest of your spring!!    Hennie Duos. Marletta Lor, D.O. Gastroenterology and Hepatology [facility]

## 2020-12-09 NOTE — Telephone Encounter (Signed)
Provider is out of office and patient will be contacted when he returns

## 2020-12-09 NOTE — Telephone Encounter (Signed)
Pt wants to start on his weight loss pills asap.

## 2020-12-12 NOTE — Telephone Encounter (Signed)
appt made, pt aware

## 2020-12-12 NOTE — Telephone Encounter (Signed)
If he wants to start weight loss pills then please make an appointment ASAP

## 2020-12-14 ENCOUNTER — Other Ambulatory Visit: Payer: Self-pay

## 2020-12-14 ENCOUNTER — Telehealth: Payer: Self-pay | Admitting: Orthopaedic Surgery

## 2020-12-14 ENCOUNTER — Telehealth: Payer: Self-pay

## 2020-12-14 ENCOUNTER — Encounter: Payer: Self-pay | Admitting: Orthopaedic Surgery

## 2020-12-14 ENCOUNTER — Ambulatory Visit (INDEPENDENT_AMBULATORY_CARE_PROVIDER_SITE_OTHER): Payer: Medicare HMO

## 2020-12-14 ENCOUNTER — Ambulatory Visit (INDEPENDENT_AMBULATORY_CARE_PROVIDER_SITE_OTHER): Payer: Medicare HMO | Admitting: Orthopaedic Surgery

## 2020-12-14 ENCOUNTER — Encounter (INDEPENDENT_AMBULATORY_CARE_PROVIDER_SITE_OTHER): Payer: Self-pay

## 2020-12-14 DIAGNOSIS — Z96651 Presence of right artificial knee joint: Secondary | ICD-10-CM

## 2020-12-14 MED ORDER — CYCLOBENZAPRINE HCL 10 MG PO TABS: 10.0000 mg | ORAL_TABLET | Freq: Three times a day (TID) | ORAL | 1 refills | Status: DC | PRN

## 2020-12-14 NOTE — Telephone Encounter (Signed)
Called pt to schedule TCS w/Dr. Marletta Lor, he said he called Monday and left a message about a medication for Alex [name] and hasn't heard anything back. Informed him Alex [name] is off this week. Reviewed chart, no telephone note in chart for Monday. Informed pt there wasn't a note in chart that he had called. Offered to get Dr. Queen Blossom nurse to speak to him. He said I left her a message Monday and she never called me back. Wanted to know do we not call patients back. 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 He began cursing and he hung up the phone.  He last seen Dr. Marletta Lor in office.

## 2020-12-14 NOTE — Telephone Encounter (Signed)
noted 

## 2020-12-14 NOTE — Telephone Encounter (Signed)
Please advise 

## 2020-12-14 NOTE — Telephone Encounter (Signed)
Noted  

## 2020-12-14 NOTE — Telephone Encounter (Signed)
Pt called stating he had an appt earlier and Dr. Magnus Ivan told him he was going to call in a cream for his knee but when he got there, there was a rx for flexeril 10 mg. He would like a CB as soon as possible to clear this up and he's waiting at the pharmacy.   [phone]

## 2020-12-14 NOTE — Progress Notes (Signed)
The patient comes in today with continued right thigh pain.  He points to the vastus medialis area of the right quadratus source of his pain.  He says he feels like his muscles pulling away from his body at times and he cannot put his shoe and sock on well on that side.  He has a history of a revision knee arthroplasty that was done in October 2020.  His original knee replacement was done in [place] and became loose.  We were able to revise his knee to longstem prosthesis.  Examination of his right knee does show some swelling to be expected but there is no effusion.  This more swelling of the bone.  There is no redness.  The knee is ligamentously stable on my exam with pretty good range of motion.  Most of his pain is significant to palpation and stretching over the VMO area.  I will like to try Flexeril as a muscle relaxant but would also like to send him to outpatient physical therapy at [facility] therapy in [facility].  I would have him work on any modalities they can help calm down the muscles of the VMO of the right quad area.  This would include ultrasound and iontophoresis/phonophoresis as well as dry needling if indicated.  We would then see him back in 6 weeks.  All questions and concerns were answered and addressed.

## 2020-12-19 ENCOUNTER — Other Ambulatory Visit: Payer: Self-pay

## 2020-12-19 ENCOUNTER — Ambulatory Visit (INDEPENDENT_AMBULATORY_CARE_PROVIDER_SITE_OTHER): Payer: Medicare HMO | Admitting: Family Medicine

## 2020-12-19 ENCOUNTER — Encounter: Payer: Self-pay | Admitting: Family Medicine

## 2020-12-19 VITALS — BP 151/89 | HR 86 | Ht 66.0 in | Wt 236.0 lb

## 2020-12-19 DIAGNOSIS — Z713 Dietary counseling and surveillance: Secondary | ICD-10-CM

## 2020-12-19 DIAGNOSIS — I152 Hypertension secondary to endocrine disorders: Secondary | ICD-10-CM | POA: Diagnosis not present

## 2020-12-19 DIAGNOSIS — E1159 Type 2 diabetes mellitus with other circulatory complications: Secondary | ICD-10-CM

## 2020-12-19 MED ORDER — PHENTERMINE HCL 37.5 MG PO CAPS: 37.5000 mg | ORAL_CAPSULE | ORAL | 0 refills | Status: DC

## 2020-12-19 MED ORDER — AMLODIPINE BESYLATE 5 MG PO TABS: 10.0000 mg | ORAL_TABLET | Freq: Every day | ORAL | 3 refills | Status: DC

## 2020-12-19 NOTE — Progress Notes (Signed)
BP (!) 151/89   Pulse 86   Ht 5\' 6"  (1.676 m)   Wt 236 lb (107 kg)   SpO2 95%   BMI 38.09 kg/m    Subjective:   Patient ID: , male    DOB: 1961-01-11, 60 y.o.   MRN: [id number]  HPI: Alex [name] is a 60 y.o. male presenting on 12/19/2020 for Obesity (Discuss meds for wt loss)   HPI Weight loss and morbid obesity Patient's diabetes hypertension are more complicated by the patient's morbid obesity.  Discussed weight loss and lifestyle modification and exercise with the patient.  Patient like to try weight loss medicine to see if he can help.  He has been struggling with that since he had an accident and surgery on his knees.  Relevant past medical, surgical, family and social history reviewed and updated as indicated. Interim medical history since our last visit reviewed. Allergies and medications reviewed and updated.  Review of Systems  Constitutional:  Positive for unexpected weight change. Negative for chills and fever.  Respiratory:  Negative for shortness of breath and wheezing.   Cardiovascular:  Negative for chest pain and leg swelling.  Musculoskeletal:  Positive for arthralgias. Negative for back pain and gait problem.  Skin:  Negative for rash.  Neurological:  Negative for dizziness, weakness and numbness.  All other systems reviewed and are negative.  Per HPI unless specifically indicated above   Allergies as of 12/19/2020       Reactions   Morphine And Related Nausea Only   Buprenorphine Hcl-naloxone Hcl Nausea Only   Codeine Nausea Only, Other (See Comments)   per pt, "felt weird in my head"        Medication List        Accurate as of December 19, 2020  9:57 AM. If you have any questions, ask your nurse or doctor.          amLODipine 5 MG tablet Commonly known as: NORVASC Take 2 tablets (10 mg total) by mouth daily. What changed: how much to take Changed by: December 21, 2020, MD   cyclobenzaprine 10 MG tablet Commonly known as:  FLEXERIL Take 1 tablet (10 mg total) by mouth 3 (three) times daily as needed for muscle spasms.   diclofenac Sodium 1 % Gel Commonly known as: Voltaren Apply 2 g topically 4 (four) times daily. What changed:  when to take this reasons to take this   dicyclomine 10 MG capsule Commonly known as: Bentyl Take 1 capsule (10 mg total) by mouth 4 (four) times daily -  before meals and at bedtime.   gabapentin 100 MG capsule Commonly known as: NEURONTIN Take 1 capsule (100 mg total) by mouth 2 (two) times daily.   ketoconazole 2 % shampoo Commonly known as: NIZORAL SHAMPOO, LATHER, LEAVE FOR 10 MINUTES, THEN RINSE. USE 2 TIMES A WEEK FOR UP TO 4 WEEKS What changed: See the new instructions.   lisinopril 40 MG tablet Commonly known as: ZESTRIL Take 1 tablet (40 mg total) by mouth daily.   methylPREDNISolone 4 MG tablet Commonly known as: Medrol Medrol dose pack. Take as instructed   nabumetone 750 MG tablet Commonly known as: RELAFEN Take 1 tablet (750 mg total) by mouth 2 (two) times daily as needed.   oxymetazoline 0.05 % nasal spray Commonly known as: AFRIN Place 1 spray into both nostrils every 6 (six) hours as needed for congestion.   Ozempic (0.25 or 0.5 MG/DOSE) 2 MG/1.5ML Sopn Generic drug: Semaglutide(0.25  or 0.5MG /DOS) Inject 0.5 mg into the skin once a week. What changed: when to take this   pantoprazole 40 MG tablet Commonly known as: PROTONIX Take 1 tablet (40 mg total) by mouth 2 (two) times daily before a meal.   phentermine 37.5 MG capsule Take 1 capsule (37.5 mg total) by mouth every morning. Started by: Elige Radon Jai Steil, MD   rosuvastatin 10 MG tablet Commonly known as: CRESTOR Take 10 mg by mouth daily.   Simethicone 125 MG Tabs Take 1 tablet (125 mg total) by mouth 3 (three) times daily before meals.   tiZANidine 4 MG tablet Commonly known as: Zanaflex Take 1 tablet (4 mg total) by mouth every 8 (eight) hours as needed for muscle spasms.          Objective:   BP (!) 151/89   Pulse 86   Ht 5\' 6"  (1.676 m)   Wt 236 lb (107 kg)   SpO2 95%   BMI 38.09 kg/m   Wt Readings from Last 3 Encounters:  12/19/20 236 lb (107 kg)  12/08/20 234 lb 3.2 oz (106.2 kg)  10/19/20 230 lb 9.6 oz (104.6 kg)    Physical Exam Vitals and nursing note reviewed.  Constitutional:      General: He is not in acute distress.    Appearance: He is well-developed. He is not diaphoretic.  Eyes:     General: No scleral icterus.    Conjunctiva/sclera: Conjunctivae normal.  Neck:     Thyroid: No thyromegaly.  Cardiovascular:     Rate and Rhythm: Normal rate and regular rhythm.     Heart sounds: Normal heart sounds. No murmur heard. Pulmonary:     Effort: Pulmonary effort is normal. No respiratory distress.     Breath sounds: Normal breath sounds. No wheezing.  Musculoskeletal:        General: Normal range of motion.     Cervical back: Neck supple.  Lymphadenopathy:     Cervical: No cervical adenopathy.  Skin:    General: Skin is warm and dry.     Findings: No rash.  Neurological:     Mental Status: He is alert and oriented to person, place, and time.     Coordination: Coordination normal.  Psychiatric:        Behavior: Behavior normal.      Assessment & Plan:   Problem List Items Addressed This Visit       Cardiovascular and Mediastinum   Hypertension associated with diabetes (HCC)   Relevant Medications   amLODipine (NORVASC) 5 MG tablet     Other   Morbid obesity (HCC)   Relevant Medications   phentermine 37.5 MG capsule   Other Visit Diagnoses     Weight loss counseling, encounter for    -  Primary       Will start phentermine, will increase his amlodipine while he is on the phentermine because of his blood pressure. Follow up plan: Return in about 4 weeks (around 01/16/2021), or if symptoms worsen or fail to improve, for Weight recheck and morbid obesity.  Counseling provided for all of the vaccine  components No orders of the defined types were placed in this encounter.   03/19/2021, MD [facility] 12/19/2020, 9:57 AM

## 2020-12-21 ENCOUNTER — Ambulatory Visit: Payer: Medicare HMO | Attending: Orthopaedic Surgery

## 2020-12-21 ENCOUNTER — Other Ambulatory Visit: Payer: Self-pay

## 2020-12-21 DIAGNOSIS — M25561 Pain in right knee: Secondary | ICD-10-CM | POA: Diagnosis not present

## 2020-12-21 DIAGNOSIS — G8929 Other chronic pain: Secondary | ICD-10-CM | POA: Diagnosis not present

## 2020-12-21 DIAGNOSIS — M25661 Stiffness of right knee, not elsewhere classified: Secondary | ICD-10-CM | POA: Diagnosis not present

## 2020-12-21 DIAGNOSIS — R600 Localized edema: Secondary | ICD-10-CM | POA: Diagnosis not present

## 2020-12-22 NOTE — Therapy (Signed)
[facility] [address] [place], [place], [address] Phone: [phone]   Fax:  [phone]  Physical Therapy Evaluation  Patient Details  Name: Alex [name] MRN: [id number] Date of Birth: 01-05-1961 Referring Provider (PT): Doneen Poisson MD   Encounter Date: 12/21/2020   PT End of Session - 12/21/20 1726     Visit Number 1    Number of Visits 12    Date for PT Re-Evaluation 01/11/21    Authorization Type FOTO AT LEAST EVERY 5TH VISIT.  PROGRESS NOTE AT 10TH VISIT.    PT Start Time [phone]    PT Stop Time 0920    PT Time Calculation (min) 63 min    Activity Tolerance Patient tolerated treatment well    Behavior During Therapy WFL for tasks assessed/performed             Past Medical History:  Diagnosis Date   Diabetes mellitus without complication (HCC)    History of kidney stones    removed 5 months ago   Hypertension    PONV (postoperative nausea and vomiting)    AGITATION after anesthesia as well   Sleep apnea    NO CPAP    Past Surgical History:  Procedure Laterality Date   ANTERIOR CERVICAL DECOMP/DISCECTOMY FUSION N/A 03/27/2018   Procedure: ANTERIOR CERVICAL DECOMPRESSION/DISCECTOMY FUSION C3-5;  Surgeon: Venita Lick, MD;  Location: [facility] OR;  Service: Orthopedics;  Laterality: N/A;  3.5 hrs   arm surgery Right    x2 involving tendons/ulnar nerve   BACK SURGERY     BIOPSY  10/17/2020   Procedure: BIOPSY;  Surgeon: Lanelle Bal, DO;  Location: [facility];  Service: Endoscopy;;   CARPAL TUNNEL RELEASE Right    clavical surgery     ESOPHAGOGASTRODUODENOSCOPY (EGD) WITH PROPOFOL N/A 10/17/2020   Procedure: ESOPHAGOGASTRODUODENOSCOPY (EGD) WITH PROPOFOL;  Surgeon: Lanelle Bal, DO;  [facility]: [facility];  Service: Endoscopy;  Laterality: N/A;  PM   EYE SURGERY Bilateral    glaucoma   HERNIA REPAIR     JOINT REPLACEMENT Right 06/2011   TOTAL KNEE REVISION Right 04/10/2019   Procedure: RIGHT  TOTAL KNEE REVISION;  Surgeon: Kathryne Hitch, MD;  Location: [facility];  Service: Orthopedics;  Laterality: Right;   TRICEPS TENDON REPAIR      There were no vitals filed for this visit.                    Objective measurements completed on examination: See above findings.                 PT Short Term Goals - 12/21/20 1725       PT SHORT TERM GOAL #1   Title Indep with initial HEP    Time 2    Period Days    Status New    Target Date 12/23/20               PT Long Term Goals - 12/21/20 1725       PT LONG TERM GOAL #1   Title Independent with a HEP.    Time 3    Period Weeks    Status New      PT LONG TERM GOAL #2   Title Active right knee flexion to 115-120 degrees+ so the patient can perform functional tasks and do so with pain not > 2-3/10.    Time 3    Period Weeks    Status New  PT LONG TERM GOAL #3   Title Increase hip IR/ER to WNL in order to don and doff shoe without limitation    Time 3    Period Weeks    Status New    Target Date 01/11/21                    Plan - 12/22/20 1721     Clinical Impression Statement Alex [name] presents to OPPT secondary to chronic pain following TKA on 04/10/2019. He performs functional squatting, bending, lifting without limitation. He produces adequate functional ROM at the knee but significantly limited at the R hip in IR/ER/ Manual therapy intervention provided this visit as well as vasopneumatic pump at the end of session to promote improved hip ER and and knee mobilty with less strain when trying to put on his shoe. Patient demonstrated understading of provided instructuons ans reports moderate relief of symptoms after therapy session. Plan to address mobility deficits with skilled PT.    Personal Factors and Comorbidities Comorbidity 1;Comorbidity 3+;Comorbidity 2    Comorbidities DM, HTN, triceps repair surgery, back surgery and CTS.    Examination-Activity  Limitations Locomotion Level;Other    Examination-Participation Restrictions Other    Stability/Clinical Decision Making Stable/Uncomplicated    Clinical Decision Making Low    Rehab Potential Excellent    PT Frequency 2x / week    PT Duration 3 weeks    PT Treatment/Interventions ADLs/Self Care Home Management;Cryotherapy;Electrical Stimulation;Gait training;Stair training;Functional mobility training;Therapeutic activities;Therapeutic exercise;Neuromuscular re-education;Manual techniques;Patient/family education;Passive range of motion;Vasopneumatic Device    PT Next Visit Plan Manual therapy HS, Hip mobility, airex training, hip hikes, posterior chain strength    PT Home Exercise Plan se pt instructions    Consulted and Agree with Plan of Care Patient             Patient will benefit from skilled therapeutic intervention in order to improve the following deficits and impairments:  Abnormal gait, Decreased range of motion, Decreased activity tolerance, Decreased strength, Increased edema, Pain  Visit Diagnosis: Chronic pain of right knee  Stiffness of right knee, not elsewhere classified     Problem List Patient Active Problem List   Diagnosis Date Noted   RUQ pain 10/11/2020   Hyperlipidemia associated with type 2 diabetes mellitus (HCC) 06/23/2020   Chronic neck pain 08/10/2019   Status post revision of total replacement of right knee 04/10/2019   Loose right total knee arthroplasty (HCC) 03/09/2019   Pain in right knee 02/17/2019   Neuropathic pain 01/01/2019   Cervical spine degeneration 12/30/2018   Right wrist pain 12/19/2018   Right arm numbness 12/19/2018   Acquired trigger finger of left middle finger 08/22/2018   Pain of left hand 08/22/2018   S/P cervical spinal fusion 03/27/2018   Morbid obesity (HCC) 02/21/2018   Pain in joint of left shoulder 02/05/2018   Osteoarthritis of right glenohumeral joint 01/21/2018   Pain in joint of right shoulder 12/30/2017    Tear of right rotator cuff 12/17/2017   Recurrent nephrolithiasis 09/18/2017   Right flank pain 08/20/2017   Drug dependence (HCC) 08/19/2017   Diabetes mellitus type 2 in obese (HCC) 03/28/2017   Hypertension associated with diabetes (HCC) 08/25/2016    Levonne Spiller 12/22/2020, 5:31 PM  [facility] [address] [place], [place], [address] Phone: [phone]   Fax:  [phone]  Name: Alex [name] MRN: [id number] Date of Birth: 15-Aug-1960

## 2020-12-23 ENCOUNTER — Other Ambulatory Visit: Payer: Self-pay

## 2020-12-23 ENCOUNTER — Ambulatory Visit: Payer: Medicare HMO | Admitting: *Deleted

## 2020-12-23 DIAGNOSIS — M25661 Stiffness of right knee, not elsewhere classified: Secondary | ICD-10-CM

## 2020-12-23 DIAGNOSIS — G8929 Other chronic pain: Secondary | ICD-10-CM

## 2020-12-23 DIAGNOSIS — M25561 Pain in right knee: Secondary | ICD-10-CM

## 2020-12-23 DIAGNOSIS — R600 Localized edema: Secondary | ICD-10-CM

## 2020-12-23 NOTE — Therapy (Signed)
[facility] [address] [place], [place], [address] Phone: [phone]   Fax:  [phone]  Physical Therapy Treatment  Patient Details  Name: Alex [name] MRN: [id number] Date of Birth: [birth year] Referring Provider (PT): Doneen Poisson MD   Encounter Date: 12/23/2020   PT End of Session - 12/23/20 0913     Visit Number 2    Number of Visits 12    Date for PT Re-Evaluation 01/11/21    Authorization Type FOTO AT LEAST EVERY 5TH VISIT.  PROGRESS NOTE AT 10TH VISIT.    PT Start Time 0912    PT Stop Time 0958    PT Time Calculation (min) 46 min             Past Medical History:  Diagnosis Date   Diabetes mellitus without complication (HCC)    History of kidney stones    removed 5 months ago   Hypertension    PONV (postoperative nausea and vomiting)    AGITATION after anesthesia as well   Sleep apnea    NO CPAP    Past Surgical History:  Procedure Laterality Date   ANTERIOR CERVICAL DECOMP/DISCECTOMY FUSION N/A 03/27/2018   Procedure: ANTERIOR CERVICAL DECOMPRESSION/DISCECTOMY FUSION C3-5;  Surgeon: Venita Lick, MD;  Location: [facility];  Service: Orthopedics;  Laterality: N/A;  3.5 hrs   arm surgery Right    x2 involving tendons/ulnar nerve   BACK SURGERY     BIOPSY  10/17/2020   Procedure: BIOPSY;  Surgeon: Lanelle Bal, DO;  [facility];  Service: Endoscopy;;   CARPAL TUNNEL RELEASE Right    clavical surgery     ESOPHAGOGASTRODUODENOSCOPY (EGD) WITH PROPOFOL N/A 10/17/2020   Procedure: ESOPHAGOGASTRODUODENOSCOPY (EGD) WITH PROPOFOL;  Surgeon: Lanelle Bal, DO;  [facility];  Service: Endoscopy;  Laterality: N/A;  PM   EYE SURGERY Bilateral    glaucoma   HERNIA REPAIR     JOINT REPLACEMENT Right 06/2011   TOTAL KNEE REVISION Right 04/10/2019   Procedure: RIGHT TOTAL KNEE REVISION;  Surgeon: Kathryne Hitch, MD;  Location: [facility];  Service: Orthopedics;  Laterality: Right;    TRICEPS TENDON REPAIR      There were no vitals filed for this visit.   Subjective Assessment - 12/23/20 0918     Subjective Patient arrives to PT with c/o R knee pain secondary to a history of RTKA and revision last Oct 02,2020. Can't  put my socks on    Patient Stated Goals be able to put my shoe on without pain    Currently in Pain? Yes    Pain Score 6     Pain Location Knee    Pain Orientation Right    Pain Descriptors / Indicators Tightness    Pain Type Surgical pain                               [facility] Adult PT Treatment/Exercise - 12/23/20 0001       Exercises   Exercises Knee/Hip      Knee/Hip Exercises: Stretches   Piriformis Stretch Right;5 reps;30 seconds   supine position and lateral hip stretching. HEP handout given.     Knee/Hip Exercises: Aerobic   Nustep L3 x 10 mins      Modalities   Modalities Vasopneumatic      Vasopneumatic   Number Minutes Vasopneumatic  15 minutes    Vasopnuematic Location  Knee  Vasopneumatic Pressure Medium    Vasopneumatic Temperature  34      Manual Therapy   Soft tissue mobilization STM/ IASTM to RT quad region, and scar mobility                      PT Short Term Goals - 12/21/20 1725       PT SHORT TERM GOAL #1   Title Indep with initial HEP    Time 2    Period Days    Status New    Target Date 12/23/20               PT Long Term Goals - 12/21/20 1725       PT LONG TERM GOAL #1   Title Independent with a HEP.    Time 3    Period Weeks    Status New      PT LONG TERM GOAL #2   Title Active right knee flexion to 115-120 degrees+ so the patient can perform functional tasks and do so with pain not > 2-3/10.    Time 3    Period Weeks    Status New      PT LONG TERM GOAL #3   Title Increase hip IR/ER to WNL in order to don and doff shoe without limitation    Time 3    Period Weeks    Status New    Target Date 01/11/21                   Plan -  12/23/20 9983     Clinical Impression Statement Pt arrived today doing fair , but still with pain RT knee when trying to cross hiss legs. Rx focused on STW and RT hip ROM to decrease pressure on RT knee. Handout given for RT hip stretch.    Personal Factors and Comorbidities Comorbidity 1;Comorbidity 3+;Comorbidity 2    Comorbidities DM, HTN, triceps repair surgery, back surgery and CTS.    Examination-Activity Limitations Locomotion Level;Other    Examination-Participation Restrictions Other    Stability/Clinical Decision Making Stable/Uncomplicated    Rehab Potential Excellent    PT Frequency 2x / week    PT Duration 3 weeks    PT Treatment/Interventions ADLs/Self Care Home Management;Cryotherapy;Electrical Stimulation;Gait training;Stair training;Functional mobility training;Therapeutic activities;Therapeutic exercise;Neuromuscular re-education;Manual techniques;Patient/family education;Passive range of motion;Vasopneumatic Device    PT Next Visit Plan Manual therapy HS, Hip mobility, airex training, hip hikes, posterior chain strength    Consulted and Agree with Plan of Care Patient             Patient will benefit from skilled therapeutic intervention in order to improve the following deficits and impairments:  Abnormal gait, Decreased range of motion, Decreased activity tolerance, Decreased strength, Increased edema, Pain  Visit Diagnosis: Chronic pain of right knee  Stiffness of right knee, not elsewhere classified  Localized edema     Problem List Patient Active Problem List   Diagnosis Date Noted   RUQ pain 10/11/2020   Hyperlipidemia associated with type 2 diabetes mellitus (HCC) 06/23/2020   Chronic neck pain 08/10/2019   Status post revision of total replacement of right knee 04/10/2019   Loose right total knee arthroplasty (HCC) 03/09/2019   Pain in right knee 02/17/2019   Neuropathic pain 01/01/2019   Cervical spine degeneration 12/30/2018   Right wrist pain  12/19/2018   Right arm numbness 12/19/2018   Acquired trigger finger of left middle finger 08/22/2018   Pain  of left hand 08/22/2018   S/P cervical spinal fusion 03/27/2018   Morbid obesity (HCC) 02/21/2018   Pain in joint of left shoulder 02/05/2018   Osteoarthritis of right glenohumeral joint 01/21/2018   Pain in joint of right shoulder 12/30/2017   Tear of right rotator cuff 12/17/2017   Recurrent nephrolithiasis 09/18/2017   Right flank pain 08/20/2017   Drug dependence (HCC) 08/19/2017   Diabetes mellitus type 2 in obese (HCC) 03/28/2017   Hypertension associated with diabetes (HCC) 08/25/2016    Manjot Beumer,CHRIS, PTA 12/23/2020, 12:52 PM  [facility] [address] [place], [place], [address] Phone: [phone]   Fax:  [phone]  Name: Hykeem Ojeda MRN: [id number] Date of Birth: 10-16-1960

## 2020-12-27 ENCOUNTER — Other Ambulatory Visit: Payer: Self-pay

## 2020-12-27 ENCOUNTER — Encounter: Payer: Self-pay | Admitting: Physical Therapy

## 2020-12-27 ENCOUNTER — Ambulatory Visit: Payer: Medicare HMO | Admitting: Physical Therapy

## 2020-12-27 DIAGNOSIS — M25561 Pain in right knee: Secondary | ICD-10-CM | POA: Diagnosis not present

## 2020-12-27 DIAGNOSIS — M25661 Stiffness of right knee, not elsewhere classified: Secondary | ICD-10-CM | POA: Diagnosis not present

## 2020-12-27 DIAGNOSIS — R600 Localized edema: Secondary | ICD-10-CM

## 2020-12-27 DIAGNOSIS — G8929 Other chronic pain: Secondary | ICD-10-CM | POA: Diagnosis not present

## 2020-12-27 NOTE — Therapy (Signed)
[facility] [address] [place], [place], [address] Phone: [phone]   Fax:  [phone]  Physical Therapy Treatment  Patient Details  Name: Alex [name] MRN: [id number] Date of Birth: May 22, 1961 Referring Provider (PT): Doneen Poisson MD   Encounter Date: 12/27/2020   PT End of Session - 12/27/20 0945     Visit Number 3    Number of Visits 12    Date for PT Re-Evaluation 01/11/21    Authorization Type FOTO AT LEAST EVERY 5TH VISIT.  PROGRESS NOTE AT 10TH VISIT.    PT Start Time 0902    PT Stop Time [phone]    PT Time Calculation (min) 41 min    Activity Tolerance Patient tolerated treatment well    Behavior During Therapy WFL for tasks assessed/performed             Past Medical History:  Diagnosis Date   Diabetes mellitus without complication (HCC)    History of kidney stones    removed 5 months ago   Hypertension    PONV (postoperative nausea and vomiting)    AGITATION after anesthesia as well   Sleep apnea    NO CPAP    Past Surgical History:  Procedure Laterality Date   ANTERIOR CERVICAL DECOMP/DISCECTOMY FUSION N/A 03/27/2018   Procedure: ANTERIOR CERVICAL DECOMPRESSION/DISCECTOMY FUSION C3-5;  Surgeon: Venita Lick, MD;  [facility]: [facility];  Service: Orthopedics;  Laterality: N/A;  3.5 hrs   arm surgery Right    x2 involving tendons/ulnar nerve   BACK SURGERY     BIOPSY  10/17/2020   Procedure: BIOPSY;  Surgeon: Lanelle Bal, DO;  [facility];  Service: Endoscopy;;   CARPAL TUNNEL RELEASE Right    clavical surgery     ESOPHAGOGASTRODUODENOSCOPY (EGD) WITH PROPOFOL N/A 10/17/2020   Procedure: ESOPHAGOGASTRODUODENOSCOPY (EGD) WITH PROPOFOL;  Surgeon: Lanelle Bal, DO;  [facility];  Service: Endoscopy;  Laterality: N/A;  PM   EYE SURGERY Bilateral    glaucoma   HERNIA REPAIR     JOINT REPLACEMENT Right 06/2011   TOTAL KNEE REVISION Right 04/10/2019   Procedure: RIGHT  TOTAL KNEE REVISION;  Surgeon: Kathryne Hitch, MD;  [facility]: [facility];  Service: Orthopedics;  Laterality: Right;   TRICEPS TENDON REPAIR      There were no vitals filed for this visit.   Subjective Assessment - 12/27/20 0905     Subjective Reports more soreness this morning but took an aleve before coming in    Pertinent History R TKA, DM, HTN, triceps repair surgery, back surgery and CTS.    Patient Stated Goals be able to put my shoe on without pain    Currently in Pain? Yes    Pain Score --   No pain score provided   Pain Location Knee    Pain Orientation Right    Pain Descriptors / Indicators Sore    Pain Type Surgical pain    Pain Onset More than a month ago    Pain Frequency Intermittent                [facility] PT Assessment - 12/27/20 0001       Assessment   Medical Diagnosis Right total knee replacement    Referring Provider (PT) Doneen Poisson MD    Onset Date/Surgical Date 04/10/19    Next MD Visit 01/07/2021  [facility] Adult PT Treatment/Exercise - 12/27/20 0001       Knee/Hip Exercises: Stretches   Passive Hamstring Stretch Right;4 reps;30 seconds      Knee/Hip Exercises: Aerobic   Nustep L3 x 11 mins, seat 8      Modalities   Modalities Vasopneumatic      Vasopneumatic   Number Minutes Vasopneumatic  10 minutes    Vasopnuematic Location  Knee    Vasopneumatic Pressure Medium    Vasopneumatic Temperature  34      Manual Therapy   Manual Therapy Myofascial release    Myofascial Release IASTW to R hip dductors, distal quad, medial HS due to tone                      PT Short Term Goals - 12/21/20 1725       PT SHORT TERM GOAL #1   Title Indep with initial HEP    Time 2    Period Days    Status New    Target Date 12/23/20               PT Long Term Goals - 12/21/20 1725       PT LONG TERM GOAL #1   Title Independent with a HEP.    Time 3    Period Weeks    Status  New      PT LONG TERM GOAL #2   Title Active right knee flexion to 115-120 degrees+ so the patient can perform functional tasks and do so with pain not > 2-3/10.    Time 3    Period Weeks    Status New      PT LONG TERM GOAL #3   Title Increase hip IR/ER to WNL in order to don and doff shoe without limitation    Time 3    Period Weeks    Status New    Target Date 01/11/21                   Plan - 12/27/20 0949     Clinical Impression Statement Patient presented in clinic with reports of soreness of R knee. Patient able to tolerate Nustep warm up. Greater tone palpable in R hip adductors and HS distally. Light to moderate redness response noted following IASTW to treated musculature. Patient educated that soreness may present following IASTW session. Normal vasopneumatic response noted following removal of the modality.    Personal Factors and Comorbidities Comorbidity 1;Comorbidity 3+;Comorbidity 2    Comorbidities DM, HTN, triceps repair surgery, back surgery and CTS.    Examination-Activity Limitations Locomotion Level;Other    Examination-Participation Restrictions Other    Stability/Clinical Decision Making Stable/Uncomplicated    Rehab Potential Excellent    PT Frequency 2x / week    PT Duration 3 weeks    PT Treatment/Interventions ADLs/Self Care Home Management;Cryotherapy;Electrical Stimulation;Gait training;Stair training;Functional mobility training;Therapeutic activities;Therapeutic exercise;Neuromuscular re-education;Manual techniques;Patient/family education;Passive range of motion;Vasopneumatic Device    PT Next Visit Plan Manual therapy HS, Hip mobility, airex training, hip hikes, posterior chain strength    PT Home Exercise Plan se pt instructions    Consulted and Agree with Plan of Care Patient             Patient will benefit from skilled therapeutic intervention in order to improve the following deficits and impairments:  Abnormal gait, Decreased  range of motion, Decreased activity tolerance, Decreased strength, Increased edema, Pain  Visit Diagnosis: Chronic pain of right  knee  Stiffness of right knee, not elsewhere classified  Localized edema     Problem List Patient Active Problem List   Diagnosis Date Noted   RUQ pain 10/11/2020   Hyperlipidemia associated with type 2 diabetes mellitus (HCC) 06/23/2020   Chronic neck pain 08/10/2019   Status post revision of total replacement of right knee 04/10/2019   Loose right total knee arthroplasty (HCC) 03/09/2019   Pain in right knee 02/17/2019   Neuropathic pain 01/01/2019   Cervical spine degeneration 12/30/2018   Right wrist pain 12/19/2018   Right arm numbness 12/19/2018   Acquired trigger finger of left middle finger 08/22/2018   Pain of left hand 08/22/2018   S/P cervical spinal fusion 03/27/2018   Morbid obesity (HCC) 02/21/2018   Pain in joint of left shoulder 02/05/2018   Osteoarthritis of right glenohumeral joint 01/21/2018   Pain in joint of right shoulder 12/30/2017   Tear of right rotator cuff 12/17/2017   Recurrent nephrolithiasis 09/18/2017   Right flank pain 08/20/2017   Drug dependence (HCC) 08/19/2017   Diabetes mellitus type 2 in obese (HCC) 03/28/2017   Hypertension associated with diabetes (HCC) 08/25/2016    Marvell Fuller, PTA 12/27/2020, 10:00 AM  [facility] [address] [place], [place], [address] Phone: [phone]   Fax:  [phone]  Name: Alex [name] MRN: [id number] Date of Birth: 1960-12-06

## 2020-12-30 ENCOUNTER — Encounter: Payer: Self-pay | Admitting: Physical Therapy

## 2020-12-30 ENCOUNTER — Other Ambulatory Visit: Payer: Self-pay

## 2020-12-30 ENCOUNTER — Ambulatory Visit: Payer: Medicare HMO | Admitting: Physical Therapy

## 2020-12-30 DIAGNOSIS — G8929 Other chronic pain: Secondary | ICD-10-CM

## 2020-12-30 DIAGNOSIS — M25661 Stiffness of right knee, not elsewhere classified: Secondary | ICD-10-CM | POA: Diagnosis not present

## 2020-12-30 DIAGNOSIS — R600 Localized edema: Secondary | ICD-10-CM

## 2020-12-30 DIAGNOSIS — M25561 Pain in right knee: Secondary | ICD-10-CM | POA: Diagnosis not present

## 2020-12-30 NOTE — Therapy (Signed)
[facility] [address] [place], [place], [address] Phone: [phone]   Fax:  [phone]  Physical Therapy Treatment  Patient Details  Name: Alex [name] MRN: [id number] Date of Birth: 03/01/1961 Referring Provider (PT): Doneen Poisson MD   Encounter Date: 12/30/2020   PT End of Session - 12/30/20 0914     Visit Number 4    Number of Visits 12    Date for PT Re-Evaluation 01/11/21    Authorization Type FOTO AT LEAST EVERY 5TH VISIT.  PROGRESS NOTE AT 10TH VISIT.    PT Start Time 0910    PT Stop Time 0949    PT Time Calculation (min) 39 min    Activity Tolerance Patient tolerated treatment well    Behavior During Therapy WFL for tasks assessed/performed             Past Medical History:  Diagnosis Date   Diabetes mellitus without complication (HCC)    History of kidney stones    removed 5 months ago   Hypertension    PONV (postoperative nausea and vomiting)    AGITATION after anesthesia as well   Sleep apnea    NO CPAP    Past Surgical History:  Procedure Laterality Date   ANTERIOR CERVICAL DECOMP/DISCECTOMY FUSION N/A 03/27/2018   Procedure: ANTERIOR CERVICAL DECOMPRESSION/DISCECTOMY FUSION C3-5;  Surgeon: Venita Lick, MD;  [facility]: [facility];  Service: Orthopedics;  Laterality: N/A;  3.5 hrs   arm surgery Right    x2 involving tendons/ulnar nerve   BACK SURGERY     BIOPSY  10/17/2020   Procedure: BIOPSY;  Surgeon: Lanelle Bal, DO;  [facility];  Service: Endoscopy;;   CARPAL TUNNEL RELEASE Right    clavical surgery     ESOPHAGOGASTRODUODENOSCOPY (EGD) WITH PROPOFOL N/A 10/17/2020   Procedure: ESOPHAGOGASTRODUODENOSCOPY (EGD) WITH PROPOFOL;  Surgeon: Lanelle Bal, DO;  [facility];  Service: Endoscopy;  Laterality: N/A;  PM   EYE SURGERY Bilateral    glaucoma   HERNIA REPAIR     JOINT REPLACEMENT Right 06/2011   TOTAL KNEE REVISION Right 04/10/2019   Procedure: RIGHT  TOTAL KNEE REVISION;  Surgeon: Kathryne Hitch, MD;  [facility];  Service: Orthopedics;  Laterality: Right;   TRICEPS TENDON REPAIR      There were no vitals filed for this visit.   Subjective Assessment - 12/30/20 0910     Subjective Reports soreness after last treatment.    Pertinent History R TKA, DM, HTN, triceps repair surgery, back surgery and CTS.    Patient Stated Goals be able to put my shoe on without pain    Currently in Pain? Yes    Pain Location Knee    Pain Orientation Right    Pain Descriptors / Indicators Sore    Pain Type Surgical pain    Pain Onset More than a month ago    Pain Frequency Intermittent                [facility] PT Assessment - 12/30/20 0001       Assessment   Medical Diagnosis Right total knee replacement    Referring Provider (PT) Doneen Poisson MD    Onset Date/Surgical Date 04/10/19    Next MD Visit 01/07/2021                           [facility] Adult PT Treatment/Exercise - 12/30/20 0001  Knee/Hip Exercises: Stretches   Passive Hamstring Stretch Right;3 reps;30 seconds    Other Knee/Hip Stretches R hip adductor stretch 3x30 sec      Knee/Hip Exercises: Aerobic   Nustep L4 x 15 mins, seat 8      Modalities   Modalities Vasopneumatic      Vasopneumatic   Number Minutes Vasopneumatic  10 minutes    Vasopnuematic Location  Knee    Vasopneumatic Pressure Medium    Vasopneumatic Temperature  34      Manual Therapy   Manual Therapy Myofascial release    Myofascial Release IASTW to R hip adductors, distal quad, medial HS due to tone                      PT Short Term Goals - 12/21/20 1725       PT SHORT TERM GOAL #1   Title Indep with initial HEP    Time 2    Period Days    Status New    Target Date 12/23/20               PT Long Term Goals - 12/21/20 1725       PT LONG TERM GOAL #1   Title Independent with a HEP.    Time 3    Period Weeks    Status New       PT LONG TERM GOAL #2   Title Active right knee flexion to 115-120 degrees+ so the patient can perform functional tasks and do so with pain not > 2-3/10.    Time 3    Period Weeks    Status New      PT LONG TERM GOAL #3   Title Increase hip IR/ER to WNL in order to don and doff shoe without limitation    Time 3    Period Weeks    Status New    Target Date 01/11/21                   Plan - 12/30/20 1103     Clinical Impression Statement Patient presented in clinic with reports of pain in anterior R knee with crossing his legs to don socks. Patient also indicating soreness of R knee as well. Patient palpably tender to R distal quad and medial HS. IASTW continued with moderate to light redness response. Passive stretching also completed in order to reduce muscle tightness of hip adductors and HS. Normal vasopneumatic response noted following removal of the modality.    Personal Factors and Comorbidities Comorbidity 1;Comorbidity 3+;Comorbidity 2    Comorbidities DM, HTN, triceps repair surgery, back surgery and CTS.    Examination-Activity Limitations Locomotion Level;Other    Examination-Participation Restrictions Other    Stability/Clinical Decision Making Stable/Uncomplicated    Rehab Potential Excellent    PT Frequency 2x / week    PT Duration 3 weeks    PT Treatment/Interventions ADLs/Self Care Home Management;Cryotherapy;Electrical Stimulation;Gait training;Stair training;Functional mobility training;Therapeutic activities;Therapeutic exercise;Neuromuscular re-education;Manual techniques;Patient/family education;Passive range of motion;Vasopneumatic Device    PT Next Visit Plan Manual therapy HS, Hip mobility, airex training, hip hikes, posterior chain strength    PT Home Exercise Plan se pt instructions    Consulted and Agree with Plan of Care Patient             Patient will benefit from skilled therapeutic intervention in order to improve the following deficits and  impairments:  Abnormal gait, Decreased range of motion, Decreased activity tolerance,  Decreased strength, Increased edema, Pain  Visit Diagnosis: Chronic pain of right knee  Stiffness of right knee, not elsewhere classified  Localized edema     Problem List Patient Active Problem List   Diagnosis Date Noted   RUQ pain 10/11/2020   Hyperlipidemia associated with type 2 diabetes mellitus (HCC) 06/23/2020   Chronic neck pain 08/10/2019   Status post revision of total replacement of right knee 04/10/2019   Loose right total knee arthroplasty (HCC) 03/09/2019   Pain in right knee 02/17/2019   Neuropathic pain 01/01/2019   Cervical spine degeneration 12/30/2018   Right wrist pain 12/19/2018   Right arm numbness 12/19/2018   Acquired trigger finger of left middle finger 08/22/2018   Pain of left hand 08/22/2018   S/P cervical spinal fusion 03/27/2018   Morbid obesity (HCC) 02/21/2018   Pain in joint of left shoulder 02/05/2018   Osteoarthritis of right glenohumeral joint 01/21/2018   Pain in joint of right shoulder 12/30/2017   Tear of right rotator cuff 12/17/2017   Recurrent nephrolithiasis 09/18/2017   Right flank pain 08/20/2017   Drug dependence (HCC) 08/19/2017   Diabetes mellitus type 2 in obese (HCC) 03/28/2017   Hypertension associated with diabetes (HCC) 08/25/2016    Marvell Fuller, PTA 12/30/2020, 11:08 AM  [facility] [address] [place], [place], [address] Phone: [phone]   Fax:  [phone]  Name: Alex [name] MRN: [id number] Date of Birth: Dec 04, 1960

## 2021-01-02 ENCOUNTER — Ambulatory Visit: Payer: Medicare HMO | Admitting: *Deleted

## 2021-01-02 ENCOUNTER — Ambulatory Visit: Payer: Medicare HMO | Admitting: Family Medicine

## 2021-01-04 ENCOUNTER — Encounter: Payer: Self-pay | Admitting: Physical Therapy

## 2021-01-04 ENCOUNTER — Other Ambulatory Visit: Payer: Self-pay

## 2021-01-04 ENCOUNTER — Ambulatory Visit: Payer: Medicare HMO | Admitting: Physical Therapy

## 2021-01-04 DIAGNOSIS — M25661 Stiffness of right knee, not elsewhere classified: Secondary | ICD-10-CM

## 2021-01-04 DIAGNOSIS — M25561 Pain in right knee: Secondary | ICD-10-CM | POA: Diagnosis not present

## 2021-01-04 DIAGNOSIS — R600 Localized edema: Secondary | ICD-10-CM

## 2021-01-04 DIAGNOSIS — G8929 Other chronic pain: Secondary | ICD-10-CM | POA: Diagnosis not present

## 2021-01-04 NOTE — Therapy (Signed)
[facility] [address] [place], [place], [address] Phone: [phone]   Fax:  [phone]  Physical Therapy Treatment  Patient Details  Name: Alex [name] MRN: [id number] Date of Birth: [birth year] Referring Provider (PT): Doneen Poisson MD   Encounter Date: 01/04/2021   PT End of Session - 01/04/21 0916     Visit Number 5    Number of Visits 12    Date for PT Re-Evaluation 01/11/21    Authorization Type FOTO AT LEAST EVERY 5TH VISIT.  PROGRESS NOTE AT 10TH VISIT.    PT Start Time 0907    PT Stop Time 0949    PT Time Calculation (min) 42 min    Activity Tolerance Patient tolerated treatment well    Behavior During Therapy WFL for tasks assessed/performed             Past Medical History:  Diagnosis Date   Diabetes mellitus without complication (HCC)    History of kidney stones    removed 5 months ago   Hypertension    PONV (postoperative nausea and vomiting)    AGITATION after anesthesia as well   Sleep apnea    NO CPAP    Past Surgical History:  Procedure Laterality Date   ANTERIOR CERVICAL DECOMP/DISCECTOMY FUSION N/A 03/27/2018   Procedure: ANTERIOR CERVICAL DECOMPRESSION/DISCECTOMY FUSION C3-5;  Surgeon: Venita Lick, MD;  [facility]: [facility];  Service: Orthopedics;  Laterality: N/A;  3.5 hrs   arm surgery Right    x2 involving tendons/ulnar nerve   BACK SURGERY     BIOPSY  10/17/2020   Procedure: BIOPSY;  Surgeon: Lanelle Bal, DO;  [facility];  Service: Endoscopy;;   CARPAL TUNNEL RELEASE Right    clavical surgery     ESOPHAGOGASTRODUODENOSCOPY (EGD) WITH PROPOFOL N/A 10/17/2020   Procedure: ESOPHAGOGASTRODUODENOSCOPY (EGD) WITH PROPOFOL;  Surgeon: Lanelle Bal, DO;  [facility];  Service: Endoscopy;  Laterality: N/A;  PM   EYE SURGERY Bilateral    glaucoma   HERNIA REPAIR     JOINT REPLACEMENT Right 06/2011   TOTAL KNEE REVISION Right 04/10/2019   Procedure: RIGHT  TOTAL KNEE REVISION;  Surgeon: Kathryne Hitch, MD;  [facility];  Service: Orthopedics;  Laterality: Right;   TRICEPS TENDON REPAIR      There were no vitals filed for this visit.   Subjective Assessment - 01/04/21 0915     Subjective Reports soreness after last treatment. More pain with donning shoes or socks. Pull in medial knee with figure 4 position.    Pertinent History R TKA, DM, HTN, triceps repair surgery, back surgery and CTS.    Patient Stated Goals be able to put my shoe on without pain    Currently in Pain? Yes    Pain Score 4     Pain Location Knee    Pain Orientation Right    Pain Descriptors / Indicators Sore    Pain Type Surgical pain    Pain Onset More than a month ago    Pain Frequency Intermittent                [facility] PT Assessment - 01/04/21 0001       Assessment   Medical Diagnosis Right total knee replacement    Referring Provider (PT) Doneen Poisson MD    Onset Date/Surgical Date 04/10/19    Next MD Visit 01/07/2021  [facility] Adult PT Treatment/Exercise - 01/04/21 0001       Knee/Hip Exercises: Stretches   Passive Hamstring Stretch Right;3 reps;30 seconds    Quad Stretch Right;3 reps;30 seconds    Other Knee/Hip Stretches R hip adductor stretch 3x30 sec      Knee/Hip Exercises: Aerobic   Nustep L4 x 15 mins, seat 8      Modalities   Modalities Vasopneumatic      Vasopneumatic   Number Minutes Vasopneumatic  10 minutes    Vasopnuematic Location  Knee    Vasopneumatic Pressure Medium    Vasopneumatic Temperature  34      Manual Therapy   Manual Therapy Myofascial release    Myofascial Release IASTW to R hip adductors, distal quad, medial HS due to tone                      PT Short Term Goals - 12/21/20 1725       PT SHORT TERM GOAL #1   Title Indep with initial HEP    Time 2    Period Days    Status New    Target Date 12/23/20               PT Long  Term Goals - 12/21/20 1725       PT LONG TERM GOAL #1   Title Independent with a HEP.    Time 3    Period Weeks    Status New      PT LONG TERM GOAL #2   Title Active right knee flexion to 115-120 degrees+ so the patient can perform functional tasks and do so with pain not > 2-3/10.    Time 3    Period Weeks    Status New      PT LONG TERM GOAL #3   Title Increase hip IR/ER to WNL in order to don and doff shoe without limitation    Time 3    Period Weeks    Status New    Target Date 01/11/21                   Plan - 01/04/21 1029     Clinical Impression Statement Patient presented in clinic with continued difficulty in crossing his legs to don shoes or socks. Patient reports a pull in medial thigh with figure 4 stretch. Patient still very tender to R hip adductors and HS with IASTW. Normal vasopneumatic response noted following removal of the modality.    Personal Factors and Comorbidities Comorbidity 1;Comorbidity 3+;Comorbidity 2    Comorbidities DM, HTN, triceps repair surgery, back surgery and CTS.    Examination-Activity Limitations Locomotion Level;Other    Examination-Participation Restrictions Other    Stability/Clinical Decision Making Stable/Uncomplicated    Rehab Potential Excellent    PT Frequency 2x / week    PT Duration 3 weeks    PT Treatment/Interventions ADLs/Self Care Home Management;Cryotherapy;Electrical Stimulation;Gait training;Stair training;Functional mobility training;Therapeutic activities;Therapeutic exercise;Neuromuscular re-education;Manual techniques;Patient/family education;Passive range of motion;Vasopneumatic Device    PT Next Visit Plan Manual therapy HS, Hip mobility, airex training, hip hikes, posterior chain strength    PT Home Exercise Plan se pt instructions    Consulted and Agree with Plan of Care Patient             Patient will benefit from skilled therapeutic intervention in order to improve the following deficits and  impairments:  Abnormal gait, Decreased range of motion, Decreased activity tolerance, Decreased  strength, Increased edema, Pain  Visit Diagnosis: Chronic pain of right knee  Stiffness of right knee, not elsewhere classified  Localized edema     Problem List Patient Active Problem List   Diagnosis Date Noted   RUQ pain 10/11/2020   Hyperlipidemia associated with type 2 diabetes mellitus (HCC) 06/23/2020   Chronic neck pain 08/10/2019   Status post revision of total replacement of right knee 04/10/2019   Loose right total knee arthroplasty (HCC) 03/09/2019   Pain in right knee 02/17/2019   Neuropathic pain 01/01/2019   Cervical spine degeneration 12/30/2018   Right wrist pain 12/19/2018   Right arm numbness 12/19/2018   Acquired trigger finger of left middle finger 08/22/2018   Pain of left hand 08/22/2018   S/P cervical spinal fusion 03/27/2018   Morbid obesity (HCC) 02/21/2018   Pain in joint of left shoulder 02/05/2018   Osteoarthritis of right glenohumeral joint 01/21/2018   Pain in joint of right shoulder 12/30/2017   Tear of right rotator cuff 12/17/2017   Recurrent nephrolithiasis 09/18/2017   Right flank pain 08/20/2017   Drug dependence (HCC) 08/19/2017   Diabetes mellitus type 2 in obese (HCC) 03/28/2017   Hypertension associated with diabetes (HCC) 08/25/2016    Marvell Fuller, PTA 01/04/2021, 10:45 AM  [facility] [address] [place], [place], [address] Phone: [phone]   Fax:  [phone]  Name: Colbie Danner MRN: [id number] Date of Birth: 25-Oct-1960

## 2021-01-05 ENCOUNTER — Ambulatory Visit: Payer: Medicare HMO | Admitting: *Deleted

## 2021-01-05 ENCOUNTER — Other Ambulatory Visit: Payer: Self-pay

## 2021-01-05 DIAGNOSIS — R600 Localized edema: Secondary | ICD-10-CM

## 2021-01-05 DIAGNOSIS — M25661 Stiffness of right knee, not elsewhere classified: Secondary | ICD-10-CM | POA: Diagnosis not present

## 2021-01-05 DIAGNOSIS — M25561 Pain in right knee: Secondary | ICD-10-CM

## 2021-01-05 DIAGNOSIS — G8929 Other chronic pain: Secondary | ICD-10-CM

## 2021-01-05 NOTE — Therapy (Signed)
[facility] [address] [place], [place], [address] Phone: [phone]   Fax:  [phone]  Physical Therapy Treatment  Patient Details  Name: Alex [name] MRN: [id number] Date of Birth: 13-May-1961 Referring Provider (PT): Doneen Poisson MD   Encounter Date: 01/05/2021   PT End of Session - 01/05/21 0916     Visit Number 6    Number of Visits 12    Date for PT Re-Evaluation 01/11/21    Authorization Type FOTO AT LEAST EVERY 5TH VISIT.  PROGRESS NOTE AT 10TH VISIT.    PT Start Time 0909    PT Stop Time 0957    PT Time Calculation (min) 48 min             Past Medical History:  Diagnosis Date   Diabetes mellitus without complication (HCC)    History of kidney stones    removed 5 months ago   Hypertension    PONV (postoperative nausea and vomiting)    AGITATION after anesthesia as well   Sleep apnea    NO CPAP    Past Surgical History:  Procedure Laterality Date   ANTERIOR CERVICAL DECOMP/DISCECTOMY FUSION N/A 03/27/2018   Procedure: ANTERIOR CERVICAL DECOMPRESSION/DISCECTOMY FUSION C3-5;  Surgeon: Venita Lick, MD;  Location: [facility];  Service: Orthopedics;  Laterality: N/A;  3.5 hrs   arm surgery Right    x2 involving tendons/ulnar nerve   BACK SURGERY     BIOPSY  10/17/2020   Procedure: BIOPSY;  Surgeon: Lanelle Bal, DO;  Location: [facility];  Service: Endoscopy;;   CARPAL TUNNEL RELEASE Right    clavical surgery     ESOPHAGOGASTRODUODENOSCOPY (EGD) WITH PROPOFOL N/A 10/17/2020   Procedure: ESOPHAGOGASTRODUODENOSCOPY (EGD) WITH PROPOFOL;  Surgeon: Lanelle Bal, DO;  [facility]: [facility];  Service: Endoscopy;  Laterality: N/A;  PM   EYE SURGERY Bilateral    glaucoma   HERNIA REPAIR     JOINT REPLACEMENT Right 06/2011   TOTAL KNEE REVISION Right 04/10/2019   Procedure: RIGHT TOTAL KNEE REVISION;  Surgeon: Kathryne Hitch, MD;  Location: [facility];  Service: Orthopedics;  Laterality: Right;    TRICEPS TENDON REPAIR      There were no vitals filed for this visit.   Subjective Assessment - 01/05/21 0912     Subjective Reports soreness after last treatment. Less pain with donning shoes or socks.    Pertinent History R TKA, DM, HTN, triceps repair surgery, back surgery and CTS.    Patient Stated Goals be able to put my shoe on without pain    Currently in Pain? Yes    Pain Score 4     Pain Location Knee    Pain Orientation Right    Pain Descriptors / Indicators Sore    Pain Type Surgical pain    Pain Onset More than a month ago                               [facility] Adult PT Treatment/Exercise - 01/05/21 0001       Knee/Hip Exercises: Stretches   Passive Hamstring Stretch Right;3 reps;30 seconds   with towel behind knee with active ankle pumps   Other Knee/Hip Stretches R hip adductor stretch 3x30 sec      Knee/Hip Exercises: Aerobic   Nustep L4 x , seat 8      Modalities   Modalities Vasopneumatic      Vasopneumatic  Number Minutes Vasopneumatic  10 minutes    Vasopnuematic Location  Knee    Vasopneumatic Pressure Medium    Vasopneumatic Temperature  34      Manual Therapy   Manual Therapy Myofascial release    Myofascial Release IASTW to R hip adductors, distal quad, medial HS due to tone/ soreness                      PT Short Term Goals - 12/21/20 1725       PT SHORT TERM GOAL #1   Title Indep with initial HEP    Time 2    Period Days    Status New    Target Date 12/23/20               PT Long Term Goals - 12/21/20 1725       PT LONG TERM GOAL #1   Title Independent with a HEP.    Time 3    Period Weeks    Status New      PT LONG TERM GOAL #2   Title Active right knee flexion to 115-120 degrees+ so the patient can perform functional tasks and do so with pain not > 2-3/10.    Time 3    Period Weeks    Status New      PT LONG TERM GOAL #3   Title Increase hip IR/ER to WNL in order to don and  doff shoe without limitation    Time 3    Period Weeks    Status New    Target Date 01/11/21                   Plan - 01/05/21 1033     Clinical Impression Statement Pt arrived today doing some better with less knee pain now when donning his socks/shoes. Rx focused on Hip mobility and STW to tight painful adductors. Normal Vaso response today.    Personal Factors and Comorbidities Comorbidity 1;Comorbidity 3+;Comorbidity 2    Comorbidities DM, HTN, triceps repair surgery, back surgery and CTS.    Stability/Clinical Decision Making Stable/Uncomplicated    Rehab Potential Excellent    PT Frequency 2x / week    PT Treatment/Interventions ADLs/Self Care Home Management;Cryotherapy;Electrical Stimulation;Gait training;Stair training;Functional mobility training;Therapeutic activities;Therapeutic exercise;Neuromuscular re-education;Manual techniques;Patient/family education;Passive range of motion;Vasopneumatic Device    PT Next Visit Plan Manual therapy HS, Hip mobility, airex training, hip hikes, posterior chain strength    Consulted and Agree with Plan of Care Patient             Patient will benefit from skilled therapeutic intervention in order to improve the following deficits and impairments:  Abnormal gait, Decreased range of motion, Decreased activity tolerance, Decreased strength, Increased edema, Pain  Visit Diagnosis: Chronic pain of right knee  Stiffness of right knee, not elsewhere classified  Localized edema     Problem List Patient Active Problem List   Diagnosis Date Noted   RUQ pain 10/11/2020   Hyperlipidemia associated with type 2 diabetes mellitus (HCC) 06/23/2020   Chronic neck pain 08/10/2019   Status post revision of total replacement of right knee 04/10/2019   Loose right total knee arthroplasty (HCC) 03/09/2019   Pain in right knee 02/17/2019   Neuropathic pain 01/01/2019   Cervical spine degeneration 12/30/2018   Right wrist pain  12/19/2018   Right arm numbness 12/19/2018   Acquired trigger finger of left middle finger 08/22/2018   Pain of left  hand 08/22/2018   S/P cervical spinal fusion 03/27/2018   Morbid obesity (HCC) 02/21/2018   Pain in joint of left shoulder 02/05/2018   Osteoarthritis of right glenohumeral joint 01/21/2018   Pain in joint of right shoulder 12/30/2017   Tear of right rotator cuff 12/17/2017   Recurrent nephrolithiasis 09/18/2017   Right flank pain 08/20/2017   Drug dependence (HCC) 08/19/2017   Diabetes mellitus type 2 in obese (HCC) 03/28/2017   Hypertension associated with diabetes (HCC) 08/25/2016    Damary Doland,CHRIS, PTA 01/05/2021, 10:45 AM  [facility] [address] [place], [place], [address] Phone: [phone]   Fax:  [phone]  Name: Emilian Stawicki MRN: [id number] Date of Birth: Apr 19, 1961

## 2021-01-06 ENCOUNTER — Other Ambulatory Visit: Payer: Self-pay | Admitting: Family Medicine

## 2021-01-06 DIAGNOSIS — I152 Hypertension secondary to endocrine disorders: Secondary | ICD-10-CM

## 2021-01-06 DIAGNOSIS — L219 Seborrheic dermatitis, unspecified: Secondary | ICD-10-CM

## 2021-01-11 ENCOUNTER — Other Ambulatory Visit: Payer: Self-pay

## 2021-01-11 ENCOUNTER — Ambulatory Visit: Payer: Medicare HMO | Attending: Orthopaedic Surgery | Admitting: *Deleted

## 2021-01-11 DIAGNOSIS — R600 Localized edema: Secondary | ICD-10-CM

## 2021-01-11 DIAGNOSIS — M25561 Pain in right knee: Secondary | ICD-10-CM | POA: Insufficient documentation

## 2021-01-11 DIAGNOSIS — G8929 Other chronic pain: Secondary | ICD-10-CM | POA: Diagnosis not present

## 2021-01-11 DIAGNOSIS — M25661 Stiffness of right knee, not elsewhere classified: Secondary | ICD-10-CM

## 2021-01-11 NOTE — Therapy (Addendum)
[facility] [place], [place], [address] Phone: [phone]   Fax:  [phone]  Physical Therapy Treatment  Patient Details  Name: Alex [name] MRN: [id number] Date of Birth: 30-Dec-1960 Referring Provider (PT): Jean Rosenthal MD   Encounter Date: 01/11/2021   PT End of Session - 01/11/21 0909     Visit Number 7    Number of Visits 12    Date for PT Re-Evaluation 01/11/21    Authorization Type FOTO AT LEAST EVERY 5TH VISIT.  PROGRESS NOTE AT 10TH VISIT.    PT Start Time 0903    PT Stop Time 0949    PT Time Calculation (min) 46 min             Past Medical History:  Diagnosis Date   Diabetes mellitus without complication ([place])    History of kidney stones    removed 5 months ago   Hypertension    PONV (postoperative nausea and vomiting)    AGITATION after anesthesia as well   Sleep apnea    NO CPAP    Past Surgical History:  Procedure Laterality Date   ANTERIOR CERVICAL DECOMP/DISCECTOMY FUSION N/A 03/27/2018   Procedure: ANTERIOR CERVICAL DECOMPRESSION/DISCECTOMY FUSION C3-5;  Surgeon: Melina Schools, MD;  Location: [place];  Service: Orthopedics;  Laterality: N/A;  3.5 hrs   arm surgery Right    x2 involving tendons/ulnar nerve   BACK SURGERY     BIOPSY  10/17/2020   Procedure: BIOPSY;  Surgeon: Eloise Harman, DO;  [facility];  Service: Endoscopy;;   CARPAL TUNNEL RELEASE Right    clavical surgery     ESOPHAGOGASTRODUODENOSCOPY (EGD) WITH PROPOFOL N/A 10/17/2020   Procedure: ESOPHAGOGASTRODUODENOSCOPY (EGD) WITH PROPOFOL;  Surgeon: Eloise Harman, DO;  [facility];  Service: Endoscopy;  Laterality: N/A;  PM   EYE SURGERY Bilateral    glaucoma   HERNIA REPAIR     JOINT REPLACEMENT Right 06/2011   TOTAL KNEE REVISION Right 04/10/2019   Procedure: RIGHT TOTAL KNEE REVISION;  Surgeon: Mcarthur Rossetti, MD;  [facility];  Service: Orthopedics;  Laterality: Right;    TRICEPS TENDON REPAIR      There were no vitals filed for this visit.   Subjective Assessment - 01/11/21 0908     Subjective Reports soreness after last treatment. Less pain with donning shoes or socks.    Pertinent History R TKA, DM, HTN, triceps repair surgery, back surgery and CTS.    Patient Stated Goals be able to put my shoe on without pain    Currently in Pain? Yes    Pain Score 4     Pain Location Knee    Pain Orientation Right    Pain Descriptors / Indicators Sore    Pain Type Surgical pain    Pain Onset More than a month ago                               [facility] Adult PT Treatment/Exercise - 01/11/21 0001       Knee/Hip Exercises: Stretches   Passive Hamstring Stretch Right;3 reps;30 seconds   with towel behind knee with active ankle pumps   Other Knee/Hip Stretches R hip adductor stretch 5x30 sec   Active stretch with abductor activation     Knee/Hip Exercises: Aerobic   Nustep L4 x 15 mins, seat 8      Modalities   Modalities  Vasopneumatic      Vasopneumatic   Number Minutes Vasopneumatic  10 minutes    Vasopnuematic Location  Knee    Vasopneumatic Pressure Medium    Vasopneumatic Temperature  34      Manual Therapy   Manual Therapy Myofascial release;Passive ROM    Myofascial Release IASTW to R hip adductors, distal quad, medial HS due to tone/ soreness    Passive ROM Passive and contract relax ER stretching with Pt supine                      PT Short Term Goals - 01/11/21 1031       PT SHORT TERM GOAL #1   Title Indep with initial HEP    Status Achieved               PT Long Term Goals - 01/11/21 1031       PT LONG TERM GOAL #1   Title Independent with a HEP.    Time 3    Period Weeks    Status Partially Met      PT LONG TERM GOAL #2   Title Active right knee flexion to 115-120 degrees+ so the patient can perform functional tasks and do so with pain not > 2-3/10.    Time 3    Status On-going       PT LONG TERM GOAL #3   Title Increase hip IR/ER to WNL in order to don and doff shoe without limitation    Time 3    Period Weeks    Status Partially Met      PT LONG TERM GOAL #4   Title Full active right  knee extension in order to normalize gait.    Period Weeks    Status On-going                   Plan - 01/11/21 0909     Clinical Impression Statement Pt arrived today still progressing with increased ROM and function. Donning socks is easier , but challenging still. Rx focused on hip mobility and and STW to adductors and quad. Normal Vaso response    Personal Factors and Comorbidities Comorbidity 1;Comorbidity 3+;Comorbidity 2    Comorbidities DM, HTN, triceps repair surgery, back surgery and CTS.    Examination-Activity Limitations Locomotion Level;Other    Stability/Clinical Decision Making Stable/Uncomplicated    Rehab Potential Excellent    PT Frequency 2x / week    PT Duration 3 weeks    PT Treatment/Interventions ADLs/Self Care Home Management;Cryotherapy;Electrical Stimulation;Gait training;Stair training;Functional mobility training;Therapeutic activities;Therapeutic exercise;Neuromuscular re-education;Manual techniques;Patient/family education;Passive range of motion;Vasopneumatic Device    PT Next Visit Plan Manual therapy HS, Hip mobility, airex training, hip hikes, posterior chain strength    Consulted and Agree with Plan of Care Patient             Patient will benefit from skilled therapeutic intervention in order to improve the following deficits and impairments:  Abnormal gait, Decreased range of motion, Decreased activity tolerance, Decreased strength, Increased edema, Pain   Patient has been seen for 7 of 12 visits but has reached end of certification date. He plans to visit MD tomorrow 01/12/21. Plan to continue per plan of care with a date extension requested to cover remaining plan of care visits.   Visit Diagnosis: Chronic pain of right  knee  Stiffness of right knee, not elsewhere classified  Localized edema     Problem List Patient Active  Problem List   Diagnosis Date Noted   RUQ pain 10/11/2020   Hyperlipidemia associated with type 2 diabetes mellitus ([place]) 06/23/2020   Chronic neck pain 08/10/2019   Status post revision of total replacement of right knee 04/10/2019   Loose right total knee arthroplasty ([place]) 03/09/2019   Pain in right knee 02/17/2019   Neuropathic pain 01/01/2019   Cervical spine degeneration 12/30/2018   Right wrist pain 12/19/2018   Right arm numbness 12/19/2018   Acquired trigger finger of left middle finger 08/22/2018   Pain of left hand 08/22/2018   S/P cervical spinal fusion 03/27/2018   Morbid obesity ([place]) 02/21/2018   Pain in joint of left shoulder 02/05/2018   Osteoarthritis of right glenohumeral joint 01/21/2018   Pain in joint of right shoulder 12/30/2017   Tear of right rotator cuff 12/17/2017   Recurrent nephrolithiasis 09/18/2017   Right flank pain 08/20/2017   Drug dependence ([place]) 08/19/2017   Diabetes mellitus type 2 in obese ([place]) 03/28/2017   Hypertension associated with diabetes ([place]) 08/25/2016    Alex [name],Alex [name], Alex [name] 01/11/2021, 10:33 AM  [facility] [address] [place], [place], [address] Phone: [phone]   Fax:  [phone]  Name: Alex [name] MRN: [id number] Date of Birth: 03-Feb-1961

## 2021-01-18 ENCOUNTER — Ambulatory Visit: Payer: Medicare HMO | Admitting: Family Medicine

## 2021-01-18 ENCOUNTER — Ambulatory Visit (INDEPENDENT_AMBULATORY_CARE_PROVIDER_SITE_OTHER): Payer: Medicare HMO | Admitting: Family Medicine

## 2021-01-18 ENCOUNTER — Encounter: Payer: Self-pay | Admitting: Family Medicine

## 2021-01-18 ENCOUNTER — Other Ambulatory Visit: Payer: Self-pay

## 2021-01-18 MED ORDER — PHENTERMINE HCL 37.5 MG PO CAPS: 37.5000 mg | ORAL_CAPSULE | ORAL | 0 refills | Status: DC

## 2021-01-18 NOTE — Progress Notes (Signed)
BP (!) 140/91   Pulse (!) 104   Ht 5\' 6"  (1.676 m)   Wt 230 lb (104.3 kg)   SpO2 97%   BMI 37.12 kg/m    Subjective:   Patient ID: , male    DOB: 1960/11/25, 60 y.o.   MRN: [id number]  HPI: Alex [name] is a 60 y.o. male presenting on 01/18/2021 for Obesity   HPI Patient's arthritis and diabetes and hypertension are more complicated by the patient's morbid obesity.  Discussed weight loss and lifestyle modification and exercise with the patient.  Patient has been doing phentermine as well and will do a second month on it.  His blood pressure is slightly high and heart rate slightly high today, we will keep a close eye on that.  He also complains of a little bit of erectile dysfunction since he started the phentermine.  We will keep it going for 2 more months for short course and then stop at that point.  Relevant past medical, surgical, family and social history reviewed and updated as indicated. Interim medical history since our last visit reviewed. Allergies and medications reviewed and updated.  Review of Systems  Constitutional:  Negative for chills and fever.  Respiratory:  Negative for shortness of breath and wheezing.   Cardiovascular:  Negative for chest pain and leg swelling.  Musculoskeletal:  Positive for arthralgias.  Skin:  Negative for rash.  Neurological:  Negative for dizziness and headaches.  All other systems reviewed and are negative.  Per HPI unless specifically indicated above   Allergies as of 01/18/2021       Reactions   Morphine And Related Nausea Only   Buprenorphine Hcl-naloxone Hcl Nausea Only   Codeine Nausea Only, Other (See Comments)   per pt, "felt weird in my head"        Medication List        Accurate as of January 18, 2021 10:43 AM. If you have any questions, ask your nurse or doctor.          amLODipine 5 MG tablet Commonly known as: NORVASC Take 2 tablets (10 mg total) by mouth daily.   cyclobenzaprine 10 MG  tablet Commonly known as: FLEXERIL Take 1 tablet (10 mg total) by mouth 3 (three) times daily as needed for muscle spasms.   diclofenac Sodium 1 % Gel Commonly known as: Voltaren Apply 2 g topically 4 (four) times daily. What changed:  when to take this reasons to take this   dicyclomine 10 MG capsule Commonly known as: Bentyl Take 1 capsule (10 mg total) by mouth 4 (four) times daily -  before meals and at bedtime.   gabapentin 100 MG capsule Commonly known as: NEURONTIN Take 1 capsule (100 mg total) by mouth 2 (two) times daily.   ketoconazole 2 % shampoo Commonly known as: NIZORAL SHAMPOO, LATHER, LEAVE FOR 10 MINUTES, THEN RINSE. USE 2 TIMES A WEEK FOR UP TO 4 WEEKS   lisinopril 40 MG tablet Commonly known as: ZESTRIL TAKE 1 TABLET DAILY   methylPREDNISolone 4 MG tablet Commonly known as: Medrol Medrol dose pack. Take as instructed   nabumetone 750 MG tablet Commonly known as: RELAFEN Take 1 tablet (750 mg total) by mouth 2 (two) times daily as needed.   oxymetazoline 0.05 % nasal spray Commonly known as: AFRIN Place 1 spray into both nostrils every 6 (six) hours as needed for congestion.   Ozempic (0.25 or 0.5 MG/DOSE) 2 MG/1.5ML Sopn Generic drug: Semaglutide(0.25 or 0.5MG /DOS)  Inject 0.5 mg into the skin once a week. What changed: when to take this   pantoprazole 40 MG tablet Commonly known as: PROTONIX Take 1 tablet (40 mg total) by mouth 2 (two) times daily before a meal.   phentermine 37.5 MG capsule Take 1 capsule (37.5 mg total) by mouth every morning.   rosuvastatin 10 MG tablet Commonly known as: CRESTOR Take 10 mg by mouth daily.   Simethicone 125 MG Tabs Take 1 tablet (125 mg total) by mouth 3 (three) times daily before meals.   tiZANidine 4 MG tablet Commonly known as: Zanaflex Take 1 tablet (4 mg total) by mouth every 8 (eight) hours as needed for muscle spasms.         Objective:   BP (!) 140/91   Pulse (!) 104   Ht 5\' 6"   (1.676 m)   Wt 230 lb (104.3 kg)   SpO2 97%   BMI 37.12 kg/m   Wt Readings from Last 3 Encounters:  01/18/21 230 lb (104.3 kg)  12/19/20 236 lb (107 kg)  12/08/20 234 lb 3.2 oz (106.2 kg)    Physical Exam Vitals and nursing note reviewed.  Constitutional:      General: He is not in acute distress.    Appearance: He is well-developed. He is not diaphoretic.  Eyes:     General: No scleral icterus.    Conjunctiva/sclera: Conjunctivae normal.  Neck:     Thyroid: No thyromegaly.  Cardiovascular:     Rate and Rhythm: Normal rate and regular rhythm.     Heart sounds: Normal heart sounds. No murmur heard. Pulmonary:     Effort: Pulmonary effort is normal. No respiratory distress.     Breath sounds: Normal breath sounds. No wheezing.  Neurological:     Mental Status: He is alert and oriented to person, place, and time.     Coordination: Coordination normal.  Psychiatric:        Behavior: Behavior normal.      Assessment & Plan:   Problem List Items Addressed This Visit       Other   Morbid obesity (HCC) - Primary   Relevant Medications   phentermine 37.5 MG capsule  Will do another month of phentermine.  Follow up plan: Return in about 4 weeks (around 02/15/2021), or if symptoms worsen or fail to improve, for Weight recheck.  Counseling provided for all of the vaccine components No orders of the defined types were placed in this encounter.   04/17/2021, MD [facility] 01/18/2021, 10:43 AM

## 2021-01-20 ENCOUNTER — Ambulatory Visit: Payer: Medicare HMO | Admitting: *Deleted

## 2021-01-20 ENCOUNTER — Other Ambulatory Visit: Payer: Self-pay

## 2021-01-20 DIAGNOSIS — M25661 Stiffness of right knee, not elsewhere classified: Secondary | ICD-10-CM | POA: Diagnosis not present

## 2021-01-20 DIAGNOSIS — R600 Localized edema: Secondary | ICD-10-CM | POA: Diagnosis not present

## 2021-01-20 DIAGNOSIS — M25561 Pain in right knee: Secondary | ICD-10-CM | POA: Diagnosis not present

## 2021-01-20 DIAGNOSIS — G8929 Other chronic pain: Secondary | ICD-10-CM

## 2021-01-20 NOTE — Therapy (Signed)
[facility] [place], [place], [address] Phone: [phone]   Fax:  [phone]  Physical Therapy Treatment  Patient Details  Name: Alex [name] MRN: [id number] Date of Birth: 27-Jul-1960 Referring Provider (PT): Jean Rosenthal MD   Encounter Date: 01/20/2021   PT End of Session - 01/20/21 0917     Visit Number 8    Number of Visits 12    Date for PT Re-Evaluation 01/11/21    Authorization Type FOTO AT LEAST EVERY 5TH VISIT.  PROGRESS NOTE AT 10TH VISIT.    PT Start Time 0903    PT Stop Time 0952    PT Time Calculation (min) 49 min             Past Medical History:  Diagnosis Date   Diabetes mellitus without complication ([place])    History of kidney stones    removed 5 months ago   Hypertension    PONV (postoperative nausea and vomiting)    AGITATION after anesthesia as well   Sleep apnea    NO CPAP    Past Surgical History:  Procedure Laterality Date   ANTERIOR CERVICAL DECOMP/DISCECTOMY FUSION N/A 03/27/2018   Procedure: ANTERIOR CERVICAL DECOMPRESSION/DISCECTOMY FUSION C3-5;  Surgeon: Melina Schools, MD;  Location: [facility];  Service: Orthopedics;  Laterality: N/A;  3.5 hrs   arm surgery Right    x2 involving tendons/ulnar nerve   BACK SURGERY     BIOPSY  10/17/2020   Procedure: BIOPSY;  Surgeon: Eloise Harman, DO;  [facility];  Service: Endoscopy;;   CARPAL TUNNEL RELEASE Right    clavical surgery     ESOPHAGOGASTRODUODENOSCOPY (EGD) WITH PROPOFOL N/A 10/17/2020   Procedure: ESOPHAGOGASTRODUODENOSCOPY (EGD) WITH PROPOFOL;  Surgeon: Eloise Harman, DO;  [facility];  Service: Endoscopy;  Laterality: N/A;  PM   EYE SURGERY Bilateral    glaucoma   HERNIA REPAIR     JOINT REPLACEMENT Right 06/2011   TOTAL KNEE REVISION Right 04/10/2019   Procedure: RIGHT TOTAL KNEE REVISION;  Surgeon: Mcarthur Rossetti, MD;  [facility];  Service: Orthopedics;  Laterality: Right;    TRICEPS TENDON REPAIR      There were no vitals filed for this visit.   Subjective Assessment - 01/20/21 0915     Subjective Covid19 screening performed upon entry.Reports soreness after last treatment. Less pain with donning shoes or socks still, but still hurts    Pertinent History R TKA, DM, HTN, triceps repair surgery, back surgery and CTS.    Patient Stated Goals be able to put my shoe on without pain    Currently in Pain? Yes    Pain Score 3     Pain Location Knee    Pain Orientation Right    Pain Descriptors / Indicators Sore    Pain Type Surgical pain    Pain Onset More than a month ago                               [facility] Adult PT Treatment/Exercise - 01/20/21 0001       Knee/Hip Exercises: Stretches   Passive Hamstring Stretch Right;3 reps;30 seconds   with towel behind knee with active ankle pumps   Other Knee/Hip Stretches R hip adductor stretch 5x30 sec   Active stretch with abductor activation     Knee/Hip Exercises: Aerobic   Nustep L4 x 15 mins, seat 8  Knee/Hip Exercises: Standing   SLS RT LE x 3 mins total      Knee/Hip Exercises: Seated   Ball Squeeze given for HEP    Clamshell with TheraBand Red   2x10 hold 5 secs     Knee/Hip Exercises: Supine   Hip Adduction Isometric Strengthening;Both;1 set;10 reps   hold 10 secs   Bridges Both;10 reps      Manual Therapy   Passive ROM Passive and contract relax ER stretching with Pt supine                      PT Short Term Goals - 01/11/21 1031       PT SHORT TERM GOAL #1   Title Indep with initial HEP    Status Achieved               PT Long Term Goals - 01/11/21 1031       PT LONG TERM GOAL #1   Title Independent with a HEP.    Time 3    Period Weeks    Status Partially Met      PT LONG TERM GOAL #2   Title Active right knee flexion to 115-120 degrees+ so the patient can perform functional tasks and do so with pain not > 2-3/10.    Time 3     Status On-going      PT LONG TERM GOAL #3   Title Increase hip IR/ER to WNL in order to don and doff shoe without limitation    Time 3    Period Weeks    Status Partially Met      PT LONG TERM GOAL #4   Title Full active right  knee extension in order to normalize gait.    Period Weeks    Status On-going                   Plan - 01/20/21 1950     Clinical Impression Statement Pt arrived today doing about the same with pain with donning socks and some other ADL's. Rx focused on RT LE balance as well as strengthening F/B manual Hip ER stretching. Handout given for HEP    Personal Factors and Comorbidities Comorbidity 1;Comorbidity 3+;Comorbidity 2    Comorbidities DM, HTN, triceps repair surgery, back surgery and CTS.    Examination-Activity Limitations Locomotion Level;Other    Stability/Clinical Decision Making Stable/Uncomplicated    Rehab Potential Excellent    PT Frequency 2x / week    PT Duration 3 weeks    PT Treatment/Interventions ADLs/Self Care Home Management;Cryotherapy;Electrical Stimulation;Gait training;Stair training;Functional mobility training;Therapeutic activities;Therapeutic exercise;Neuromuscular re-education;Manual techniques;Patient/family education;Passive range of motion;Vasopneumatic Device    PT Next Visit Plan Manual therapy HS, Hip mobility, airex training, hip hikes, posterior chain strength    PT Home Exercise Plan se pt instructions    Consulted and Agree with Plan of Care Patient             Patient will benefit from skilled therapeutic intervention in order to improve the following deficits and impairments:  Abnormal gait, Decreased range of motion, Decreased activity tolerance, Decreased strength, Increased edema, Pain  Visit Diagnosis: Chronic pain of right knee  Stiffness of right knee, not elsewhere classified  Localized edema     Problem List Patient Active Problem List   Diagnosis Date Noted   RUQ pain 10/11/2020    Hyperlipidemia associated with type 2 diabetes mellitus ([place]) 06/23/2020   Chronic neck pain 08/10/2019  Status post revision of total replacement of right knee 04/10/2019   Loose right total knee arthroplasty ([place]) 03/09/2019   Pain in right knee 02/17/2019   Neuropathic pain 01/01/2019   Cervical spine degeneration 12/30/2018   Right wrist pain 12/19/2018   Right arm numbness 12/19/2018   Acquired trigger finger of left middle finger 08/22/2018   Pain of left hand 08/22/2018   S/P cervical spinal fusion 03/27/2018   Morbid obesity ([place]) 02/21/2018   Pain in joint of left shoulder 02/05/2018   Osteoarthritis of right glenohumeral joint 01/21/2018   Pain in joint of right shoulder 12/30/2017   Tear of right rotator cuff 12/17/2017   Recurrent nephrolithiasis 09/18/2017   Right flank pain 08/20/2017   Drug dependence ([place]) 08/19/2017   Diabetes mellitus type 2 in obese ([place]) 03/28/2017   Hypertension associated with diabetes ([place]) 08/25/2016    Payslee Bateson,CHRIS, PTA 01/20/2021, 12:19 PM  [facility] [place], [place], [address] Phone: [phone]   Fax:  [phone]  Name: Jahsiah Carpenter MRN: [id number] Date of Birth: Aug 24, 1960

## 2021-01-25 ENCOUNTER — Ambulatory Visit: Payer: Medicare HMO | Admitting: Orthopaedic Surgery

## 2021-01-25 ENCOUNTER — Ambulatory Visit: Payer: Medicare HMO | Admitting: Physical Therapy

## 2021-01-25 ENCOUNTER — Encounter: Payer: Self-pay | Admitting: Orthopaedic Surgery

## 2021-01-25 DIAGNOSIS — R6889 Other general symptoms and signs: Secondary | ICD-10-CM | POA: Diagnosis not present

## 2021-01-25 DIAGNOSIS — M25561 Pain in right knee: Secondary | ICD-10-CM

## 2021-01-25 NOTE — Progress Notes (Signed)
HPI: Alex [name] comes in today for follow-up of his right thigh pain.  He states overall he is about 25% better.  He is going to physical therapy.  He still having some pain whenever he is putting on his socks.  He is status post right knee revision 04/10/2019.  Examination: Right knee has full extension full flexion.  Some mild edema no effusion abnormal warmth erythema.  Surgical incisions well-healed.  No instability valgus varus stressing.  He has tenderness with palpation over the VMO region.  Impression: Status post right knee revision 04/10/2019 Right knee pain  Plan: He will continue physical therapy to work on strengthening particularly his VMO and also stretching.  They will also include modalities to the right thigh.  He will follow-up with [place] in 6 months we will obtain an AP and lateral view of his right knee at that time.  Follow-up sooner if there is any questions concerns.

## 2021-01-27 ENCOUNTER — Ambulatory Visit: Payer: Medicare HMO | Admitting: *Deleted

## 2021-01-27 ENCOUNTER — Other Ambulatory Visit: Payer: Self-pay

## 2021-01-27 DIAGNOSIS — M25661 Stiffness of right knee, not elsewhere classified: Secondary | ICD-10-CM | POA: Diagnosis not present

## 2021-01-27 DIAGNOSIS — R600 Localized edema: Secondary | ICD-10-CM | POA: Diagnosis not present

## 2021-01-27 DIAGNOSIS — M25561 Pain in right knee: Secondary | ICD-10-CM | POA: Diagnosis not present

## 2021-01-27 DIAGNOSIS — G8929 Other chronic pain: Secondary | ICD-10-CM

## 2021-01-27 NOTE — Therapy (Signed)
[facility] [place], [place], [address] Phone: [phone]   Fax:  [phone]  Physical Therapy Treatment  Patient Details  Name: Alex [name] MRN: [id number] Date of Birth: Feb 15, 1961 Referring Provider (PT): Jean Rosenthal MD   Encounter Date: 01/27/2021   PT End of Session - 01/27/21 0922     Visit Number 9    Number of Visits 12    Date for PT Re-Evaluation 01/11/21    Authorization Type FOTO AT LEAST EVERY 5TH VISIT.  PROGRESS NOTE AT 10TH VISIT.    PT Start Time 0910    PT Stop Time 0958    PT Time Calculation (min) 48 min    Activity Tolerance Patient tolerated treatment well    Behavior During Therapy WFL for tasks assessed/performed             Past Medical History:  Diagnosis Date   Diabetes mellitus without complication ([place])    History of kidney stones    removed 5 months ago   Hypertension    PONV (postoperative nausea and vomiting)    AGITATION after anesthesia as well   Sleep apnea    NO CPAP    Past Surgical History:  Procedure Laterality Date   ANTERIOR CERVICAL DECOMP/DISCECTOMY FUSION N/A 03/27/2018   Procedure: ANTERIOR CERVICAL DECOMPRESSION/DISCECTOMY FUSION C3-5;  Surgeon: Melina Schools, MD;  Location: [facility];  Service: Orthopedics;  Laterality: N/A;  3.5 hrs   arm surgery Right    x2 involving tendons/ulnar nerve   BACK SURGERY     BIOPSY  10/17/2020   Procedure: BIOPSY;  Surgeon: Eloise Harman, DO;  [facility];  Service: Endoscopy;;   CARPAL TUNNEL RELEASE Right    clavical surgery     ESOPHAGOGASTRODUODENOSCOPY (EGD) WITH PROPOFOL N/A 10/17/2020   Procedure: ESOPHAGOGASTRODUODENOSCOPY (EGD) WITH PROPOFOL;  Surgeon: Eloise Harman, DO;  [facility];  Service: Endoscopy;  Laterality: N/A;  PM   EYE SURGERY Bilateral    glaucoma   HERNIA REPAIR     JOINT REPLACEMENT Right 06/2011   TOTAL KNEE REVISION Right 04/10/2019   Procedure: RIGHT  TOTAL KNEE REVISION;  Surgeon: Mcarthur Rossetti, MD;  [facility];  Service: Orthopedics;  Laterality: Right;   TRICEPS TENDON REPAIR      There were no vitals filed for this visit.   Subjective Assessment - 01/27/21 0919     Subjective Covid19 screening performed upon entry.Reports soreness after last treatment. Less pain 25% better. MD wanted me to cont PT to stretch and strengthen RT knee/hip    Pertinent History R TKA, DM, HTN, triceps repair surgery, back surgery and CTS.    Patient Stated Goals be able to put my shoe on without pain    Currently in Pain? Yes    Pain Score 3     Pain Location Knee    Pain Orientation Right    Pain Type Surgical pain    Pain Onset More than a month ago                               [facility] Adult PT Treatment/Exercise - 01/27/21 0001       Knee/Hip Exercises: Stretches   Piriformis Stretch Right;5 reps;30 seconds    Other Knee/Hip Stretches R hip adductor stretch 5x30 sec   Active stretch with abductor activation     Knee/Hip Exercises: Aerobic   Nustep L4  x 10 mins, seat 8      Knee/Hip Exercises: Standing   Heel Raises Both;1 set;20 reps    Heel Raises Limitations Toe raises x20    Forward Lunges Right;2 sets;10 reps   on 12 inch box with quad control   Forward Step Up Right;2 sets;10 reps;Step Height: 4";Hand Hold: 2   focus on quad control and decreae UE assist   Step Down Right;2 sets;10 reps;Hand Hold: 2;Step Height: 4"   focus on quad control, but use UEs to scale back pain   SLS RT LE x 3 mins total    Other Standing Knee Exercises Try standing isomstric adductor strengthening      Electrical Stimulation   Electrical Stimulation Location RT knee    Electrical Stimulation Action IFC    Electrical Stimulation Parameters RT adductor/knee    Electrical Stimulation Goals Pain;Tone                      PT Short Term Goals - 01/11/21 1031       PT SHORT TERM GOAL #1   Title Indep with  initial HEP    Status Achieved               PT Long Term Goals - 01/11/21 1031       PT LONG TERM GOAL #1   Title Independent with a HEP.    Time 3    Period Weeks    Status Partially Met      PT LONG TERM GOAL #2   Title Active right knee flexion to 115-120 degrees+ so the patient can perform functional tasks and do so with pain not > 2-3/10.    Time 3    Status On-going      PT LONG TERM GOAL #3   Title Increase hip IR/ER to WNL in order to don and doff shoe without limitation    Time 3    Period Weeks    Status Partially Met      PT LONG TERM GOAL #4   Title Full active right  knee extension in order to normalize gait.    Period Weeks    Status On-going                   Plan - 01/27/21 5625     Clinical Impression Statement Pt arrived today after MD f/u to cont PT for RT knee/ LE strengthening and stretching. Rx focused on CKC exs for RT LE strengthening/ROM for ankle/knee/hip f/b IFC end of session. Tolkerated well.    Personal Factors and Comorbidities Comorbidity 1;Comorbidity 3+;Comorbidity 2    Comorbidities DM, HTN, triceps repair surgery, back surgery and CTS.    Examination-Activity Limitations Locomotion Level;Other    Examination-Participation Restrictions Other    Rehab Potential Excellent    PT Frequency 2x / week    PT Duration 3 weeks    PT Treatment/Interventions ADLs/Self Care Home Management;Cryotherapy;Electrical Stimulation;Gait training;Stair training;Functional mobility training;Therapeutic activities;Therapeutic exercise;Neuromuscular re-education;Manual techniques;Patient/family education;Passive range of motion;Vasopneumatic Device             Patient will benefit from skilled therapeutic intervention in order to improve the following deficits and impairments:  Abnormal gait, Decreased range of motion, Decreased activity tolerance, Decreased strength, Increased edema, Pain  Visit Diagnosis: Chronic pain of right  knee  Stiffness of right knee, not elsewhere classified  Localized edema     Problem List Patient Active Problem List   Diagnosis Date Noted  RUQ pain 10/11/2020   Hyperlipidemia associated with type 2 diabetes mellitus ([place]) 06/23/2020   Chronic neck pain 08/10/2019   Status post revision of total replacement of right knee 04/10/2019   Loose right total knee arthroplasty ([place]) 03/09/2019   Pain in right knee 02/17/2019   Neuropathic pain 01/01/2019   Cervical spine degeneration 12/30/2018   Right wrist pain 12/19/2018   Right arm numbness 12/19/2018   Acquired trigger finger of left middle finger 08/22/2018   Pain of left hand 08/22/2018   S/P cervical spinal fusion 03/27/2018   Morbid obesity ([place]) 02/21/2018   Pain in joint of left shoulder 02/05/2018   Osteoarthritis of right glenohumeral joint 01/21/2018   Pain in joint of right shoulder 12/30/2017   Tear of right rotator cuff 12/17/2017   Recurrent nephrolithiasis 09/18/2017   Right flank pain 08/20/2017   Drug dependence ([place]) 08/19/2017   Diabetes mellitus type 2 in obese ([place]) 03/28/2017   Hypertension associated with diabetes ([place]) 08/25/2016    Bevin Mayall,CHRIS, PTA 01/27/2021, 12:38 PM  [facility] [place], [place], [address] Phone: [phone]   Fax:  [phone]  Name: Alex [name] MRN: [id number] Date of Birth: 05-30-1961

## 2021-01-31 ENCOUNTER — Other Ambulatory Visit: Payer: Self-pay

## 2021-01-31 ENCOUNTER — Encounter: Payer: Self-pay | Admitting: Physical Therapy

## 2021-01-31 ENCOUNTER — Ambulatory Visit: Payer: Medicare HMO | Admitting: Physical Therapy

## 2021-01-31 DIAGNOSIS — G8929 Other chronic pain: Secondary | ICD-10-CM

## 2021-01-31 DIAGNOSIS — R600 Localized edema: Secondary | ICD-10-CM

## 2021-01-31 DIAGNOSIS — M25661 Stiffness of right knee, not elsewhere classified: Secondary | ICD-10-CM | POA: Diagnosis not present

## 2021-01-31 DIAGNOSIS — M25561 Pain in right knee: Secondary | ICD-10-CM | POA: Diagnosis not present

## 2021-01-31 NOTE — Patient Instructions (Signed)
Access Code: [email or web address] URL: [email or web address] Date: 01/31/2021 Prepared by: Raynelle Fanning  Exercises Sidelying Hip Adduction with Ankle Weight - Leg Behind - 1 x daily - 4 x weekly - 3 sets - 10 reps Sidelying Hip Abduction - 1 x daily - 4 x weekly - 3 sets - 10 reps Kneeling Adductor Stretch with Hip External Rotation (Mirrored) - 2 x daily - 7 x weekly - 1 sets - 10 reps Side Lunge Adductor Stretch - 2 x daily - 7 x weekly - 1 sets - 10 reps - 2-3 sec hold

## 2021-01-31 NOTE — Therapy (Signed)
[facility] [place], [place], [address] Phone: [phone]   Fax:  [phone]  Physical Therapy Treatment and Progress Note  Patient Details  Name: Alex [name] MRN: [id number] Date of Birth: [birth year] Referring Provider (PT): Jean Rosenthal MD   Encounter Date: 01/31/2021  Physical Therapy Progress Note  Dates of Reporting Period: 12/21/20 to 01/31/21   PT End of Session - 01/31/21 0955     Visit Number 10    Number of Visits 20    Date for PT Re-Evaluation 03/09/21    Authorization Type FOTO AT LEAST EVERY 5TH VISIT.  PROGRESS NOTE AT 10TH VISIT.    PT Start Time [phone]    PT Stop Time 1031    PT Time Calculation (min) 38 min    Activity Tolerance Patient tolerated treatment well    Behavior During Therapy WFL for tasks assessed/performed             Past Medical History:  Diagnosis Date   Diabetes mellitus without complication ([place])    History of kidney stones    removed 5 months ago   Hypertension    PONV (postoperative nausea and vomiting)    AGITATION after anesthesia as well   Sleep apnea    NO CPAP    Past Surgical History:  Procedure Laterality Date   ANTERIOR CERVICAL DECOMP/DISCECTOMY FUSION N/A 03/27/2018   Procedure: ANTERIOR CERVICAL DECOMPRESSION/DISCECTOMY FUSION C3-5;  Surgeon: Melina Schools, MD;  Location: [facility];  Service: Orthopedics;  Laterality: N/A;  3.5 hrs   arm surgery Right    x2 involving tendons/ulnar nerve   BACK SURGERY     BIOPSY  10/17/2020   Procedure: BIOPSY;  Surgeon: Eloise Harman, DO;  [facility];  Service: Endoscopy;;   CARPAL TUNNEL RELEASE Right    clavical surgery     ESOPHAGOGASTRODUODENOSCOPY (EGD) WITH PROPOFOL N/A 10/17/2020   Procedure: ESOPHAGOGASTRODUODENOSCOPY (EGD) WITH PROPOFOL;  Surgeon: Eloise Harman, DO;  [facility];  Service: Endoscopy;  Laterality: N/A;  PM   EYE SURGERY Bilateral    glaucoma   HERNIA  REPAIR     JOINT REPLACEMENT Right 06/2011   TOTAL KNEE REVISION Right 04/10/2019   Procedure: RIGHT TOTAL KNEE REVISION;  Surgeon: Mcarthur Rossetti, MD;  [facility];  Service: Orthopedics;  Laterality: Right;   TRICEPS TENDON REPAIR      There were no vitals filed for this visit.   Subjective Assessment - 01/31/21 0956     Subjective Covid19 screening performed upon entry. It's better overall but not where I want it to be yet.    Pertinent History R TKA, DM, HTN, triceps repair surgery, back surgery and CTS.    Patient Stated Goals be able to put my shoe on without pain    Currently in Pain? Yes    Pain Score 4     Pain Location Knee    Pain Orientation Right    Pain Descriptors / Indicators Sore                [facility] PT Assessment - 01/31/21 0001       AROM   Overall AROM Comments Right knee 0-107 active; 0-116 passive; Hip IR 33 deg; ER WNL                           [facility] Adult PT Treatment/Exercise - 01/31/21 0001  Knee/Hip Exercises: Stretches   Other Knee/Hip Stretches dynamic hip add stretch in standing wide stance 2x10; also in quadriped with right leg ABD x 5 (butt sits back);      Knee/Hip Exercises: Aerobic   Nustep L4 x 8 min seat 8      Knee/Hip Exercises: Sidelying   Hip ADduction Right;10 reps;3 sets    Hip ADduction Limitations slow                    PT Education - 01/31/21 1457     Education Details HEP progressed    Person(s) Educated Patient    Methods Explanation;Demonstration;Handout    Comprehension Verbalized understanding;Returned demonstration              PT Short Term Goals - 01/11/21 1031       PT SHORT TERM GOAL #1   Title Indep with initial HEP    Status Achieved               PT Long Term Goals - 01/31/21 1500       PT LONG TERM GOAL #1   Title Independent with a HEP.    Status Partially Met      PT LONG TERM GOAL #2   Title Active right knee flexion to  115-120 degrees+ so the patient can perform functional tasks and do so with pain not > 2-3/10.    Status On-going      PT LONG TERM GOAL #3   Title Increase hip IR/ER to WNL in order to don and doff shoe without limitation    Status Achieved      PT LONG TERM GOAL #4   Title Full active right  knee extension in order to normalize gait.    Status Achieved                   Plan - 01/31/21 1118     Clinical Impression Statement Patient is progressing overall with LTGs. He has met his extension and hip IR/ER goals, but is still limited in knee flexion. He reports continued tightness in right hip ADDuctors. He has weakness in the hip ADDuctors and ABDuctors as well. We worked on Research scientist (life sciences) and strengthening of these today. He will benefit from continued PT to address these deficits.    Comorbidities DM, HTN, triceps repair surgery, back surgery and CTS.    PT Frequency 2x / week    PT Duration 4 weeks    PT Treatment/Interventions ADLs/Self Care Home Management;Cryotherapy;Electrical Stimulation;Gait training;Stair training;Functional mobility training;Therapeutic activities;Therapeutic exercise;Neuromuscular re-education;Manual techniques;Patient/family education;Passive range of motion;Vasopneumatic Device    PT Next Visit Plan Hip mobility, airex training, hip hikes, posterior chain strength    PT Home Exercise Plan se pt instructions; [email or web address]    Consulted and Agree with Plan of Care Patient             Patient will benefit from skilled therapeutic intervention in order to improve the following deficits and impairments:  Abnormal gait, Decreased range of motion, Decreased activity tolerance, Decreased strength, Increased edema, Pain  Visit Diagnosis: Chronic pain of right knee - Plan: PT plan of care cert/re-cert  Stiffness of right knee, not elsewhere classified - Plan: PT plan of care cert/re-cert  Localized edema - Plan: PT plan of care  cert/re-cert     Problem List Patient Active Problem List   Diagnosis Date Noted   RUQ pain 10/11/2020   Hyperlipidemia associated with type 2 diabetes mellitus (  [place]) 06/23/2020   Chronic neck pain 08/10/2019   Status post revision of total replacement of right knee 04/10/2019   Loose right total knee arthroplasty ([place]) 03/09/2019   Pain in right knee 02/17/2019   Neuropathic pain 01/01/2019   Cervical spine degeneration 12/30/2018   Right wrist pain 12/19/2018   Right arm numbness 12/19/2018   Acquired trigger finger of left middle finger 08/22/2018   Pain of left hand 08/22/2018   S/P cervical spinal fusion 03/27/2018   Morbid obesity ([place]) 02/21/2018   Pain in joint of left shoulder 02/05/2018   Osteoarthritis of right glenohumeral joint 01/21/2018   Pain in joint of right shoulder 12/30/2017   Tear of right rotator cuff 12/17/2017   Recurrent nephrolithiasis 09/18/2017   Right flank pain 08/20/2017   Drug dependence ([place]) 08/19/2017   Diabetes mellitus type 2 in obese ([place]) 03/28/2017   Hypertension associated with diabetes ([place]) 08/25/2016   Madelyn Flavors PT 01/31/2021, 3:28 PM  [facility] [place], [place], [address] Phone: [phone]   Fax:  [phone]  Name: Alex [name] MRN: [id number] Date of Birth: [birth year]

## 2021-02-01 NOTE — Addendum Note (Signed)
Addended by: Levonne Spiller on: 02/01/2021 12:17 PM   Modules accepted: Orders

## 2021-02-02 ENCOUNTER — Ambulatory Visit: Payer: Medicare HMO | Admitting: Physical Therapy

## 2021-02-07 ENCOUNTER — Ambulatory Visit: Payer: Medicare HMO | Attending: Orthopaedic Surgery | Admitting: *Deleted

## 2021-02-07 ENCOUNTER — Other Ambulatory Visit: Payer: Self-pay

## 2021-02-07 DIAGNOSIS — M25661 Stiffness of right knee, not elsewhere classified: Secondary | ICD-10-CM | POA: Diagnosis not present

## 2021-02-07 DIAGNOSIS — G8929 Other chronic pain: Secondary | ICD-10-CM | POA: Diagnosis not present

## 2021-02-07 DIAGNOSIS — R600 Localized edema: Secondary | ICD-10-CM

## 2021-02-07 DIAGNOSIS — M25561 Pain in right knee: Secondary | ICD-10-CM | POA: Diagnosis not present

## 2021-02-07 NOTE — Therapy (Signed)
[facility] [place], [place], [address] Phone: [phone]   Fax:  [phone]  Physical Therapy Treatment  Patient Details  Name: Alex [name] MRN: [id number] Date of Birth: 10-Jun-1961 Referring Provider (PT): Jean Rosenthal MD   Encounter Date: 02/07/2021     Past Medical History:  Diagnosis Date   Diabetes mellitus without complication ([place])    History of kidney stones    removed 5 months ago   Hypertension    PONV (postoperative nausea and vomiting)    AGITATION after anesthesia as well   Sleep apnea    NO CPAP    Past Surgical History:  Procedure Laterality Date   ANTERIOR CERVICAL DECOMP/DISCECTOMY FUSION N/A 03/27/2018   Procedure: ANTERIOR CERVICAL DECOMPRESSION/DISCECTOMY FUSION C3-5;  Surgeon: Melina Schools, MD;  Location: [facility];  Service: Orthopedics;  Laterality: N/A;  3.5 hrs   arm surgery Right    x2 involving tendons/ulnar nerve   BACK SURGERY     BIOPSY  10/17/2020   Procedure: BIOPSY;  Surgeon: Eloise Harman, DO;  Location: [facility];  Service: Endoscopy;;   CARPAL TUNNEL RELEASE Right    clavical surgery     ESOPHAGOGASTRODUODENOSCOPY (EGD) WITH PROPOFOL N/A 10/17/2020   Procedure: ESOPHAGOGASTRODUODENOSCOPY (EGD) WITH PROPOFOL;  Surgeon: Eloise Harman, DO;  [facility]: [facility];  Service: Endoscopy;  Laterality: N/A;  PM   EYE SURGERY Bilateral    glaucoma   HERNIA REPAIR     JOINT REPLACEMENT Right 06/2011   TOTAL KNEE REVISION Right 04/10/2019   Procedure: RIGHT TOTAL KNEE REVISION;  Surgeon: Mcarthur Rossetti, MD;  Location: [facility];  Service: Orthopedics;  Laterality: Right;   TRICEPS TENDON REPAIR      There were no vitals filed for this visit.                                  PT Short Term Goals - 01/11/21 1031       PT SHORT TERM GOAL #1   Title Indep with initial HEP    Status Achieved               PT Long Term  Goals - 02/07/21 0926       PT LONG TERM GOAL #1   Title Independent with a HEP.    Time 3    Period Weeks    Status Achieved      PT LONG TERM GOAL #2   Title Active right knee flexion to 115-120 degrees+ so the patient can perform functional tasks and do so with pain not > 2-3/10.    Time 3    Period Weeks    Status Not Met   108 degrees     PT LONG TERM GOAL #3   Title Increase hip IR/ER to WNL in order to don and doff shoe without limitation    Period Weeks    Status Achieved      PT LONG TERM GOAL #4   Title Full active right  knee extension in order to normalize gait.    Period Weeks    Status Achieved                     Patient will benefit from skilled therapeutic intervention in order to improve the following deficits and impairments:  Abnormal gait, Decreased range of motion, Decreased activity tolerance, Decreased strength, Increased edema,  Pain  Visit Diagnosis: Chronic pain of right knee  Stiffness of right knee, not elsewhere classified  Localized edema     Problem List Patient Active Problem List   Diagnosis Date Noted   RUQ pain 10/11/2020   Hyperlipidemia associated with type 2 diabetes mellitus ([place]) 06/23/2020   Chronic neck pain 08/10/2019   Status post revision of total replacement of right knee 04/10/2019   Loose right total knee arthroplasty ([place]) 03/09/2019   Pain in right knee 02/17/2019   Neuropathic pain 01/01/2019   Cervical spine degeneration 12/30/2018   Right wrist pain 12/19/2018   Right arm numbness 12/19/2018   Acquired trigger finger of left middle finger 08/22/2018   Pain of left hand 08/22/2018   S/P cervical spinal fusion 03/27/2018   Morbid obesity ([place]) 02/21/2018   Pain in joint of left shoulder 02/05/2018   Osteoarthritis of right glenohumeral joint 01/21/2018   Pain in joint of right shoulder 12/30/2017   Tear of right rotator cuff 12/17/2017   Recurrent nephrolithiasis 09/18/2017   Right flank pain  08/20/2017   Drug dependence ([place]) 08/19/2017   Diabetes mellitus type 2 in obese ([place]) 03/28/2017   Hypertension associated with diabetes ([place]) 08/25/2016    APPLEGATE, [place], PTA 02/14/2021, 10:45 AM  [facility] [address] [place], [place], [address] Phone: [phone]   Fax:  [phone]  Name: Alex [name] MRN: [id number] Date of Birth: [birth year]  PHYSICAL THERAPY DISCHARGE SUMMARY  Visits from Start of Care: 11.  Current functional level related to goals / functional outcomes: See above.   Remaining deficits: See goal section.   Education / Equipment: HEP.   Patient agrees to discharge. Patient goals were partially met. Patient is being discharged due to being pleased with the current functional level.    Mali Applegate MPT

## 2021-02-08 ENCOUNTER — Telehealth: Payer: Self-pay | Admitting: Pharmacist

## 2021-02-08 NOTE — Telephone Encounter (Signed)
69-month supply of ozempic arrived at pcp office Patient picked up Will re-ernoll in program in November 2022

## 2021-02-09 ENCOUNTER — Encounter: Payer: Medicare HMO | Admitting: Physical Therapy

## 2021-02-14 ENCOUNTER — Encounter: Payer: Self-pay | Admitting: *Deleted

## 2021-02-23 ENCOUNTER — Other Ambulatory Visit: Payer: Self-pay

## 2021-02-23 ENCOUNTER — Encounter: Payer: Self-pay | Admitting: Family Medicine

## 2021-02-23 ENCOUNTER — Ambulatory Visit (INDEPENDENT_AMBULATORY_CARE_PROVIDER_SITE_OTHER): Payer: Medicare HMO | Admitting: Family Medicine

## 2021-02-23 MED ORDER — PHENTERMINE HCL 37.5 MG PO CAPS: 37.5000 mg | ORAL_CAPSULE | ORAL | 0 refills | Status: DC

## 2021-02-23 NOTE — Progress Notes (Signed)
BP 122/86   Pulse 88   Ht 5\' 6"  (1.676 m)   Wt 229 lb (103.9 kg)   SpO2 93%   BMI 36.96 kg/m    Subjective:   Patient ID: , male    DOB: 31-Oct-1960, 60 y.o.   MRN: [id number]  HPI: Alex [name] is a 60 y.o. male presenting on 02/23/2021 for Obesity (Weight check. Weight at last visit 230lb on 7/13)   HPI Patient is coming in today for morbid obesity and weight recheck.  He is down 1 pound from last month of phentermine.  Weight is still more than before with the phentermine as this will be his third month, discussed other weight loss strategies and diet strategies and reducing portion size and increasing protein and reducing carbohydrates.  We also talked about increasing physical activity and exercise.  Relevant past medical, surgical, family and social history reviewed and updated as indicated. Interim medical history since our last visit reviewed. Allergies and medications reviewed and updated.  Review of Systems  Constitutional:  Negative for chills and fever.  Eyes:  Negative for discharge.  Respiratory:  Negative for shortness of breath and wheezing.   Cardiovascular:  Negative for chest pain and leg swelling.  Musculoskeletal:  Negative for back pain and gait problem.  Skin:  Negative for rash.  All other systems reviewed and are negative.  Per HPI unless specifically indicated above   Allergies as of 02/23/2021       Reactions   Morphine And Related Nausea Only   Buprenorphine Hcl-naloxone Hcl Nausea Only   Codeine Nausea Only, Other (See Comments)   per pt, "felt weird in my head"        Medication List        Accurate as of February 23, 2021 10:15 AM. If you have any questions, ask your nurse or doctor.          amLODipine 5 MG tablet Commonly known as: NORVASC Take 2 tablets (10 mg total) by mouth daily.   cyclobenzaprine 10 MG tablet Commonly known as: FLEXERIL Take 1 tablet (10 mg total) by mouth 3 (three) times daily as needed for  muscle spasms.   diclofenac Sodium 1 % Gel Commonly known as: Voltaren Apply 2 g topically 4 (four) times daily. What changed:  when to take this reasons to take this   dicyclomine 10 MG capsule Commonly known as: Bentyl Take 1 capsule (10 mg total) by mouth 4 (four) times daily -  before meals and at bedtime.   gabapentin 100 MG capsule Commonly known as: NEURONTIN Take 1 capsule (100 mg total) by mouth 2 (two) times daily.   ketoconazole 2 % shampoo Commonly known as: NIZORAL SHAMPOO, LATHER, LEAVE FOR 10 MINUTES, THEN RINSE. USE 2 TIMES A WEEK FOR UP TO 4 WEEKS   lisinopril 40 MG tablet Commonly known as: ZESTRIL TAKE 1 TABLET DAILY   methylPREDNISolone 4 MG tablet Commonly known as: Medrol Medrol dose pack. Take as instructed   nabumetone 750 MG tablet Commonly known as: RELAFEN Take 1 tablet (750 mg total) by mouth 2 (two) times daily as needed.   oxymetazoline 0.05 % nasal spray Commonly known as: AFRIN Place 1 spray into both nostrils every 6 (six) hours as needed for congestion.   Ozempic (0.25 or 0.5 MG/DOSE) 2 MG/1.5ML Sopn Generic drug: Semaglutide(0.25 or 0.5MG /DOS) Inject 0.5 mg into the skin once a week. What changed: when to take this   pantoprazole 40 MG tablet Commonly  known as: PROTONIX Take 1 tablet (40 mg total) by mouth 2 (two) times daily before a meal.   phentermine 37.5 MG capsule Take 1 capsule (37.5 mg total) by mouth every morning.   rosuvastatin 10 MG tablet Commonly known as: CRESTOR Take 10 mg by mouth daily.   Simethicone 125 MG Tabs Take 1 tablet (125 mg total) by mouth 3 (three) times daily before meals.   tiZANidine 4 MG tablet Commonly known as: Zanaflex Take 1 tablet (4 mg total) by mouth every 8 (eight) hours as needed for muscle spasms.         Objective:   BP 122/86   Pulse 88   Ht 5\' 6"  (1.676 m)   Wt 229 lb (103.9 kg)   SpO2 93%   BMI 36.96 kg/m   Wt Readings from Last 3 Encounters:  02/23/21 229 lb  (103.9 kg)  01/18/21 230 lb (104.3 kg)  12/19/20 236 lb (107 kg)    Physical Exam Vitals and nursing note reviewed.  Constitutional:      Appearance: Normal appearance. He is obese.  Skin:    Findings: No lesion or rash.  Neurological:     Mental Status: He is alert.      Assessment & Plan:   Problem List Items Addressed This Visit       Other   Morbid obesity (HCC) - Primary   Relevant Medications   phentermine 37.5 MG capsule    Will do 1 more month of phentermine and encouraged diet weight loss and exercise. Follow up plan: No follow-ups on file.  Counseling provided for all of the vaccine components No orders of the defined types were placed in this encounter.   12/21/20, MD [facility] 02/23/2021, 10:15 AM

## 2021-03-07 ENCOUNTER — Other Ambulatory Visit: Payer: Self-pay | Admitting: Family Medicine

## 2021-03-08 ENCOUNTER — Telehealth: Payer: Self-pay | Admitting: Family Medicine

## 2021-03-08 MED ORDER — DICLOFENAC SODIUM 75 MG PO TBEC: 75.0000 mg | DELAYED_RELEASE_TABLET | Freq: Two times a day (BID) | ORAL | 3 refills | Status: DC

## 2021-03-08 NOTE — Telephone Encounter (Signed)
Medication likely fell off his list because he does not feel it on a regular basis and it was expired, it was probably auto denied, has sent a refill for him.

## 2021-03-08 NOTE — Telephone Encounter (Signed)
Pt came in stating that he doesn't know why Dr Dettinger would deny a refill on this medicine when it should be on his med list because he prescribes it to him as needed. Pt says he is going out of town tomorrow and really needs a refill called in to [facility] today.  Please call patient whether sent or not.

## 2021-03-08 NOTE — Telephone Encounter (Signed)
  Prescription Request  03/08/2021  Is this a "Controlled Substance" medicine? No Have you seen your PCP in the last 2 weeks? 02/23/2021 If YES, route message to pool  -  If NO, patient needs to be scheduled for appointment.  What is the name of the medication or equipment?  diclofenac (VOLTAREN) 75 MG EC tablet   Have you contacted your pharmacy to request a refill?   Yes but it was denied. Pt called the office this am and wanted to know why. I explained it was denied due to not being on current med list. Pt said he takes the medication everyday. Please call back  Which pharmacy would you like this sent to?  [facility]   Patient notified that their request is being sent to the clinical staff for review and that they should receive a response within 2 business days.

## 2021-03-08 NOTE — Telephone Encounter (Signed)
Pt informed and understood

## 2021-03-11 DIAGNOSIS — M79671 Pain in right foot: Secondary | ICD-10-CM | POA: Diagnosis not present

## 2021-03-11 DIAGNOSIS — E119 Type 2 diabetes mellitus without complications: Secondary | ICD-10-CM | POA: Diagnosis not present

## 2021-03-11 DIAGNOSIS — Z6836 Body mass index (BMI) 36.0-36.9, adult: Secondary | ICD-10-CM | POA: Diagnosis not present

## 2021-03-11 DIAGNOSIS — E669 Obesity, unspecified: Secondary | ICD-10-CM | POA: Diagnosis not present

## 2021-03-16 ENCOUNTER — Telehealth: Payer: Self-pay

## 2021-03-16 NOTE — Telephone Encounter (Signed)
Patient states he needs a tens unit for his right knee.  Would like a CB from Dr. Magnus Ivan at [phone].  Please advise.  Thank you.

## 2021-03-17 ENCOUNTER — Telehealth: Payer: Self-pay

## 2021-03-17 NOTE — Telephone Encounter (Signed)
Patient called concerning TENS unit.  Advised patient of message in chart per Dr. Magnus Ivan.  Patient voiced that he understands and stated that he will come by on Monday, 03/20/2021 and pick up the Rx.

## 2021-03-20 NOTE — Telephone Encounter (Signed)
Rx at front desk

## 2021-03-22 ENCOUNTER — Telehealth: Payer: Self-pay | Admitting: Orthopaedic Surgery

## 2021-03-22 NOTE — Telephone Encounter (Signed)
Tried calling pt to advise but the line was busy

## 2021-03-22 NOTE — Telephone Encounter (Signed)
Is this ok?

## 2021-03-22 NOTE — Telephone Encounter (Signed)
Alex [name] is your scan and fax system working now for you to help me with this?

## 2021-03-22 NOTE — Telephone Encounter (Signed)
Patient called asked if the Rx for the Tens Unit can be faxed to the [facility]? Patient asked if he can get a call when Rx is faxed to the pharmacy. The number to contact [facility] is [phone]    The fax number to the [facility] is [phone]  The number to contact patient is [phone]

## 2021-03-22 NOTE — Telephone Encounter (Signed)
I have faxed this per patient's request. I did not call him. Thanks.

## 2021-03-24 ENCOUNTER — Ambulatory Visit (INDEPENDENT_AMBULATORY_CARE_PROVIDER_SITE_OTHER): Payer: Medicare HMO | Admitting: Family Medicine

## 2021-03-24 ENCOUNTER — Encounter: Payer: Self-pay | Admitting: Family Medicine

## 2021-03-24 ENCOUNTER — Other Ambulatory Visit: Payer: Self-pay

## 2021-03-24 DIAGNOSIS — Z23 Encounter for immunization: Secondary | ICD-10-CM | POA: Diagnosis not present

## 2021-03-24 DIAGNOSIS — R635 Abnormal weight gain: Secondary | ICD-10-CM

## 2021-03-24 NOTE — Progress Notes (Signed)
BP 136/83   Pulse (!) 108   Temp 98.2 F (36.8 C) (Temporal)   Ht 5\' 6"  (1.676 m)   Wt 228 lb 4 oz (103.5 kg)   BMI 36.84 kg/m    Subjective:   Patient ID: , male    DOB: February 24, 1961, 60 y.o.   MRN: [id number]  HPI: Alex [name] is a 60 y.o. male presenting on 03/24/2021 for Obesity   HPI Obesity and weight management Patient is coming in today for obesity and weight management.  Has been taking the phentermine, and seems to have lost 1 pound.  He does admit that he is not been very active and has not done well change in his diet and has been a lot more sedentary.  He really wants to do something that will help more with that.  He wants to be referred to the healthy weight and wellness clinic.  Relevant past medical, surgical, family and social history reviewed and updated as indicated. Interim medical history since our last visit reviewed. Allergies and medications reviewed and updated.  Review of Systems  Constitutional:  Negative for chills and fever.  Respiratory:  Negative for shortness of breath and wheezing.   Cardiovascular:  Negative for chest pain and leg swelling.  Musculoskeletal:  Negative for back pain and gait problem.  Skin:  Negative for rash.  All other systems reviewed and are negative.  Per HPI unless specifically indicated above   Allergies as of 03/24/2021       Reactions   Morphine And Related Nausea Only   Buprenorphine Hcl-naloxone Hcl Nausea Only   Codeine Nausea Only, Other (See Comments)   per pt, "felt weird in my head"        Medication List        Accurate as of March 24, 2021  8:53 AM. If you have any questions, ask your nurse or doctor.          STOP taking these medications    gabapentin 100 MG capsule Commonly known as: NEURONTIN Stopped by: March 26, 2021, MD   methylPREDNISolone 4 MG tablet Commonly known as: Medrol Stopped by: Nils Pyle, MD   phentermine 37.5 MG capsule Stopped by:  Nils Pyle, MD   rosuvastatin 10 MG tablet Commonly known as: CRESTOR Stopped by: Nils Pyle, MD   Simethicone 125 MG Tabs Stopped by: Nils Pyle Ashaunti Treptow, MD       TAKE these medications    amLODipine 5 MG tablet Commonly known as: NORVASC Take 2 tablets (10 mg total) by mouth daily.   cyclobenzaprine 10 MG tablet Commonly known as: FLEXERIL Take 1 tablet (10 mg total) by mouth 3 (three) times daily as needed for muscle spasms.   diclofenac 75 MG EC tablet Commonly known as: VOLTAREN Take 1 tablet (75 mg total) by mouth 2 (two) times daily.   diclofenac Sodium 1 % Gel Commonly known as: Voltaren Apply 2 g topically 4 (four) times daily. What changed:  when to take this reasons to take this   dicyclomine 10 MG capsule Commonly known as: Bentyl Take 1 capsule (10 mg total) by mouth 4 (four) times daily -  before meals and at bedtime.   ketoconazole 2 % shampoo Commonly known as: NIZORAL SHAMPOO, LATHER, LEAVE FOR 10 MINUTES, THEN RINSE. USE 2 TIMES A WEEK FOR UP TO 4 WEEKS   lisinopril 40 MG tablet Commonly known as: ZESTRIL TAKE 1 TABLET DAILY   nabumetone 750 MG tablet  Commonly known as: RELAFEN Take 1 tablet (750 mg total) by mouth 2 (two) times daily as needed.   oxymetazoline 0.05 % nasal spray Commonly known as: AFRIN Place 1 spray into both nostrils every 6 (six) hours as needed for congestion.   Ozempic (0.25 or 0.5 MG/DOSE) 2 MG/1.5ML Sopn Generic drug: Semaglutide(0.25 or 0.5MG /DOS) Inject 0.5 mg into the skin once a week. What changed: when to take this   pantoprazole 40 MG tablet Commonly known as: PROTONIX Take 1 tablet (40 mg total) by mouth 2 (two) times daily before a meal.   tiZANidine 4 MG tablet Commonly known as: Zanaflex Take 1 tablet (4 mg total) by mouth every 8 (eight) hours as needed for muscle spasms.         Objective:   BP 136/83   Pulse (!) 108   Temp 98.2 F (36.8 C) (Temporal)   Ht 5\' 6"  (1.676  m)   Wt 228 lb 4 oz (103.5 kg)   BMI 36.84 kg/m   Wt Readings from Last 3 Encounters:  03/24/21 228 lb 4 oz (103.5 kg)  02/23/21 229 lb (103.9 kg)  01/18/21 230 lb (104.3 kg)    Physical Exam Vitals and nursing note reviewed.  Constitutional:      General: He is not in acute distress.    Appearance: He is well-developed. He is not diaphoretic.  Eyes:     General: No scleral icterus.    Conjunctiva/sclera: Conjunctivae normal.  Neck:     Thyroid: No thyromegaly.  Cardiovascular:     Rate and Rhythm: Normal rate and regular rhythm.     Heart sounds: Normal heart sounds. No murmur heard. Pulmonary:     Effort: Pulmonary effort is normal. No respiratory distress.     Breath sounds: Normal breath sounds. No wheezing.  Musculoskeletal:     Cervical back: Neck supple.  Lymphadenopathy:     Cervical: No cervical adenopathy.  Skin:    General: Skin is warm and dry.     Findings: No rash.  Neurological:     Mental Status: He is alert and oriented to person, place, and time.     Coordination: Coordination normal.  Psychiatric:        Behavior: Behavior normal.      Assessment & Plan:   Problem List Items Addressed This Visit       Other   Morbid obesity (HCC) - Primary   Relevant Orders   Amb ref to Medical Nutrition Therapy-MNT    Patient did not lose good weight from the phentermine, will do a referral to the nutritional and diabetic healthy weight and wellness clinic.  He admits that he has not been very active and played a factor. Follow up plan: Return in about 2 months (around 05/24/2021), or if symptoms worsen or fail to improve, for Recheck chronic medical issues including hypertension and diabetes.  Counseling provided for all of the vaccine components Orders Placed This Encounter  Procedures   Amb ref to Medical Nutrition Therapy-MNT    05/26/2021, MD [facility] 03/24/2021, 8:53 AM

## 2021-04-04 ENCOUNTER — Other Ambulatory Visit: Payer: Self-pay | Admitting: Family Medicine

## 2021-04-04 DIAGNOSIS — L219 Seborrheic dermatitis, unspecified: Secondary | ICD-10-CM

## 2021-04-19 ENCOUNTER — Ambulatory Visit: Payer: Medicare HMO | Admitting: Orthopaedic Surgery

## 2021-05-24 ENCOUNTER — Encounter (INDEPENDENT_AMBULATORY_CARE_PROVIDER_SITE_OTHER): Payer: Self-pay

## 2021-05-24 ENCOUNTER — Encounter: Payer: Self-pay | Admitting: Family Medicine

## 2021-05-24 ENCOUNTER — Other Ambulatory Visit: Payer: Self-pay

## 2021-05-24 ENCOUNTER — Ambulatory Visit (INDEPENDENT_AMBULATORY_CARE_PROVIDER_SITE_OTHER): Payer: Medicare HMO | Admitting: Family Medicine

## 2021-05-24 VITALS — BP 131/71 | HR 59 | Ht 66.0 in | Wt 229.0 lb

## 2021-05-24 DIAGNOSIS — Z1211 Encounter for screening for malignant neoplasm of colon: Secondary | ICD-10-CM

## 2021-05-24 DIAGNOSIS — I152 Hypertension secondary to endocrine disorders: Secondary | ICD-10-CM

## 2021-05-24 DIAGNOSIS — E785 Hyperlipidemia, unspecified: Secondary | ICD-10-CM

## 2021-05-24 DIAGNOSIS — E1169 Type 2 diabetes mellitus with other specified complication: Secondary | ICD-10-CM | POA: Diagnosis not present

## 2021-05-24 DIAGNOSIS — E669 Obesity, unspecified: Secondary | ICD-10-CM

## 2021-05-24 DIAGNOSIS — E1142 Type 2 diabetes mellitus with diabetic polyneuropathy: Secondary | ICD-10-CM | POA: Diagnosis not present

## 2021-05-24 DIAGNOSIS — E1159 Type 2 diabetes mellitus with other circulatory complications: Secondary | ICD-10-CM | POA: Diagnosis not present

## 2021-05-24 DIAGNOSIS — Z23 Encounter for immunization: Secondary | ICD-10-CM

## 2021-05-24 LAB — BAYER DCA HB A1C WAIVED: HB A1C (BAYER DCA - WAIVED): 6 % — ABNORMAL HIGH (ref 4.8–5.6)

## 2021-05-24 MED ORDER — SEMAGLUTIDE (1 MG/DOSE) 4 MG/3ML ~~LOC~~ SOPN: 1.0000 mg | PEN_INJECTOR | SUBCUTANEOUS | 3 refills | Status: DC

## 2021-05-24 NOTE — Addendum Note (Signed)
Addended by: Arville Care on: 05/24/2021 12:39 PM   Modules accepted: Orders

## 2021-05-24 NOTE — Progress Notes (Signed)
BP 131/71   Pulse (!) 59   Ht 5\' 6"  (1.676 m)   Wt 229 lb (103.9 kg)   SpO2 100%   BMI 36.96 kg/m    Subjective:   Patient ID: , male    DOB: July 01, 1961, 60 y.o.   MRN: [id number]  HPI: Alex [name] is a 60 y.o. male presenting on 05/24/2021 for Medical Management of Chronic Issues and Obesity (Weight check)   HPI Type 2 diabetes mellitus Patient comes in today for recheck of his diabetes. Patient has been currently taking Ozempic 0.5. Patient is currently on an ACE inhibitor/ARB. Patient has seen an ophthalmologist this year. Patient Denies any issues with their feet. The symptom started onset as an adult hypertension and hyperlipidemia ARE RELATED TO DM.  Patient wants to do morbid obesity weight loss counseling and wants a referral to healthy weight and wellness  Hypertension Patient is currently on lisinopril and amlodipine, and their blood pressure today is 131/71. Patient denies any lightheadedness or dizziness. Patient denies headaches, blurred vision, chest pains, shortness of breath, or weakness. Denies any side effects from medication and is content with current medication.   Hyperlipidemia Patient is coming in for recheck of his hyperlipidemia. The patient is currently taking no medication currently. They deny any issues with myalgias or history of liver damage from it. They deny any focal numbness or weakness or chest pain.   Relevant past medical, surgical, family and social history reviewed and updated as indicated. Interim medical history since our last visit reviewed. Allergies and medications reviewed and updated.  Review of Systems  Constitutional:  Negative for chills and fever.  Respiratory:  Negative for shortness of breath and wheezing.   Cardiovascular:  Negative for chest pain and leg swelling.  Musculoskeletal:  Negative for back pain and gait problem.  Skin:  Negative for rash.  Neurological:  Negative for dizziness, weakness and numbness.   All other systems reviewed and are negative.  Per HPI unless specifically indicated above   Allergies as of 05/24/2021       Reactions   Morphine And Related Nausea Only   Buprenorphine Hcl-naloxone Hcl Nausea Only   Codeine Nausea Only, Other (See Comments)   per pt, "felt weird in my head"        Medication List        Accurate as of May 24, 2021 12:37 PM. If you have any questions, ask your nurse or doctor.          STOP taking these medications    Ozempic (0.25 or 0.5 MG/DOSE) 2 MG/1.5ML Sopn Generic drug: Semaglutide(0.25 or 0.5MG /DOS) Replaced by: Semaglutide (1 MG/DOSE) 4 MG/3ML Sopn Stopped by: May 26, 2021 Drucella Karbowski, MD       TAKE these medications    amLODipine 5 MG tablet Commonly known as: NORVASC Take 2 tablets (10 mg total) by mouth daily.   cyclobenzaprine 10 MG tablet Commonly known as: FLEXERIL Take 1 tablet (10 mg total) by mouth 3 (three) times daily as needed for muscle spasms.   diclofenac 75 MG EC tablet Commonly known as: VOLTAREN Take 1 tablet (75 mg total) by mouth 2 (two) times daily.   diclofenac Sodium 1 % Gel Commonly known as: Voltaren Apply 2 g topically 4 (four) times daily. What changed:  when to take this reasons to take this   dicyclomine 10 MG capsule Commonly known as: Bentyl Take 1 capsule (10 mg total) by mouth 4 (four) times daily -  before meals  and at bedtime.   ketoconazole 2 % shampoo Commonly known as: NIZORAL SHAMPOO, LATHER, LEAVE FOR 10 MINUTES, THEN RINSE. USE 2 TIMES A WEEK FOR UP TO 4 WEEKS   lisinopril 40 MG tablet Commonly known as: ZESTRIL TAKE 1 TABLET DAILY   nabumetone 750 MG tablet Commonly known as: RELAFEN Take 1 tablet (750 mg total) by mouth 2 (two) times daily as needed.   oxymetazoline 0.05 % nasal spray Commonly known as: AFRIN Place 1 spray into both nostrils every 6 (six) hours as needed for congestion.   pantoprazole 40 MG tablet Commonly known as: PROTONIX Take 1  tablet (40 mg total) by mouth 2 (two) times daily before a meal.   Semaglutide (1 MG/DOSE) 4 MG/3ML Sopn Inject 1 mg as directed once a week. Replaces: Ozempic (0.25 or 0.5 MG/DOSE) 2 MG/1.5ML Sopn Started by: Elige Radon Demontray Franta, MD   tiZANidine 4 MG tablet Commonly known as: Zanaflex Take 1 tablet (4 mg total) by mouth every 8 (eight) hours as needed for muscle spasms.         Objective:   BP 131/71   Pulse (!) 59   Ht 5\' 6"  (1.676 m)   Wt 229 lb (103.9 kg)   SpO2 100%   BMI 36.96 kg/m   Wt Readings from Last 3 Encounters:  05/24/21 229 lb (103.9 kg)  03/24/21 228 lb 4 oz (103.5 kg)  02/23/21 229 lb (103.9 kg)    Physical Exam Vitals and nursing note reviewed.  Constitutional:      General: He is not in acute distress.    Appearance: He is well-developed. He is not diaphoretic.  Eyes:     General: No scleral icterus.    Conjunctiva/sclera: Conjunctivae normal.  Neck:     Thyroid: No thyromegaly.  Cardiovascular:     Rate and Rhythm: Normal rate and regular rhythm.     Heart sounds: Normal heart sounds. No murmur heard. Pulmonary:     Effort: Pulmonary effort is normal. No respiratory distress.     Breath sounds: Normal breath sounds. No wheezing.  Musculoskeletal:        General: Normal range of motion.     Cervical back: Neck supple.  Lymphadenopathy:     Cervical: No cervical adenopathy.  Skin:    General: Skin is warm and dry.     Findings: No rash.  Neurological:     Mental Status: He is alert and oriented to person, place, and time.     Coordination: Coordination normal.  Psychiatric:        Behavior: Behavior normal.      Assessment & Plan:   Problem List Items Addressed This Visit       Cardiovascular and Mediastinum   Hypertension associated with diabetes (HCC)   Relevant Medications   Semaglutide, 1 MG/DOSE, 4 MG/3ML SOPN     Endocrine   Diabetes mellitus type 2 in obese (HCC)   Relevant Medications   Semaglutide, 1 MG/DOSE, 4  MG/3ML SOPN   Hyperlipidemia associated with type 2 diabetes mellitus (HCC)   Relevant Medications   Semaglutide, 1 MG/DOSE, 4 MG/3ML SOPN     Other   Morbid obesity (HCC)   Relevant Medications   Semaglutide, 1 MG/DOSE, 4 MG/3ML SOPN   Other Visit Diagnoses     Colon cancer screening    -  Primary   Relevant Orders   Cologuard   Type 2 diabetes mellitus with diabetic polyneuropathy, without long-term current use of insulin (HCC)  Relevant Medications   Semaglutide, 1 MG/DOSE, 4 MG/3ML SOPN       Increase Ozempic to 1 mg to help more with weight and blood sugar, will check levels today.  A1c is pending. Follow up plan: Return in about 3 months (around 08/24/2021), or if symptoms worsen or fail to improve, for Hypertension and hyperlipidemia and diabetes and obesity.  Counseling provided for all of the vaccine components Orders Placed This Encounter  Procedures   Cologuard    Arville Care, MD [facility] 05/24/2021, 12:37 PM

## 2021-05-24 NOTE — Addendum Note (Signed)
Addended by: Dorene Sorrow on: 05/24/2021 02:48 PM   Modules accepted: Orders

## 2021-05-25 LAB — LIPID PANEL
Chol/HDL Ratio: 2.7 ratio (ref 0.0–5.0)
Cholesterol, Total: 133 mg/dL (ref 100–199)
HDL: 49 mg/dL (ref >39)
LDL Chol Calc (NIH): 58 mg/dL (ref 0–99)
Triglycerides: 151 mg/dL — ABNORMAL HIGH (ref 0–149)
VLDL Cholesterol Cal: 26 mg/dL (ref 5–40)

## 2021-05-25 LAB — CMP14+EGFR
ALT: 36 IU/L (ref 0–44)
AST: 28 IU/L (ref 0–40)
Albumin/Globulin Ratio: 2.1 (ref 1.2–2.2)
Albumin: 4.5 g/dL (ref 3.8–4.9)
Alkaline Phosphatase: 83 IU/L (ref 44–121)
BUN/Creatinine Ratio: 13 (ref 10–24)
BUN: 10 mg/dL (ref 8–27)
Bilirubin Total: 0.3 mg/dL (ref 0.0–1.2)
CO2: 23 mmol/L (ref 20–29)
Calcium: 9.1 mg/dL (ref 8.6–10.2)
Chloride: 105 mmol/L (ref 96–106)
Creatinine, Ser: 0.8 mg/dL (ref 0.76–1.27)
Globulin, Total: 2.1 g/dL (ref 1.5–4.5)
Glucose: 91 mg/dL (ref 70–99)
Potassium: 4.4 mmol/L (ref 3.5–5.2)
Sodium: 147 mmol/L — ABNORMAL HIGH (ref 134–144)
Total Protein: 6.6 g/dL (ref 6.0–8.5)
eGFR: 101 mL/min/{1.73_m2} (ref >59)

## 2021-05-25 LAB — CBC WITH DIFFERENTIAL/PLATELET
Basophils Absolute: 0 10*3/uL (ref 0.0–0.2)
Basos: 0 %
EOS (ABSOLUTE): 0.2 10*3/uL (ref 0.0–0.4)
Eos: 2 %
Hematocrit: 39.8 % (ref 37.5–51.0)
Hemoglobin: 13.3 g/dL (ref 13.0–17.7)
Immature Grans (Abs): 0 10*3/uL (ref 0.0–0.1)
Immature Granulocytes: 0 %
Lymphocytes Absolute: 2.7 10*3/uL (ref 0.7–3.1)
Lymphs: 33 %
MCH: 29.5 pg (ref 26.6–33.0)
MCHC: 33.4 g/dL (ref 31.5–35.7)
MCV: 88 fL (ref 79–97)
Monocytes Absolute: 0.6 10*3/uL (ref 0.1–0.9)
Monocytes: 7 %
Neutrophils Absolute: 4.6 10*3/uL (ref 1.4–7.0)
Neutrophils: 58 %
Platelets: 210 10*3/uL (ref 150–450)
RBC: 4.51 x10E6/uL (ref 4.14–5.80)
RDW: 13.8 % (ref 11.6–15.4)
WBC: 8 10*3/uL (ref 3.4–10.8)

## 2021-05-25 LAB — TSH: TSH: 1.18 u[IU]/mL (ref 0.450–4.500)

## 2021-06-02 DIAGNOSIS — M25561 Pain in right knee: Secondary | ICD-10-CM | POA: Diagnosis not present

## 2021-06-02 DIAGNOSIS — G8929 Other chronic pain: Secondary | ICD-10-CM | POA: Diagnosis not present

## 2021-06-02 DIAGNOSIS — Z96651 Presence of right artificial knee joint: Secondary | ICD-10-CM | POA: Diagnosis not present

## 2021-06-02 DIAGNOSIS — M1711 Unilateral primary osteoarthritis, right knee: Secondary | ICD-10-CM | POA: Diagnosis not present

## 2021-06-02 DIAGNOSIS — M1990 Unspecified osteoarthritis, unspecified site: Secondary | ICD-10-CM | POA: Diagnosis not present

## 2021-06-02 DIAGNOSIS — M25461 Effusion, right knee: Secondary | ICD-10-CM | POA: Diagnosis not present

## 2021-06-05 ENCOUNTER — Telehealth: Payer: Self-pay | Admitting: Orthopaedic Surgery

## 2021-06-05 NOTE — Telephone Encounter (Signed)
Have tried calling, voicemail not set up

## 2021-06-05 NOTE — Telephone Encounter (Signed)
Patient was seen at [facility] ER in [place], [place] this weekend for right knee pain and was given Diazepam. He has paperwork showing proof of prescription but he states this medication has worked best & he is requesting a script.

## 2021-06-06 ENCOUNTER — Other Ambulatory Visit: Payer: Self-pay | Admitting: Orthopaedic Surgery

## 2021-06-06 MED ORDER — DIAZEPAM 5 MG PO TABS: 5.0000 mg | ORAL_TABLET | Freq: Two times a day (BID) | ORAL | 0 refills | Status: DC | PRN

## 2021-06-06 NOTE — Telephone Encounter (Signed)
Pt returned Ashley's call and I made him aware of the last message Dr.Blackman put in, in regards to the valium rx. Pt states that is what helps with the pain in his knee and if Dr.Blackman doesn't do something to help with his pain he's getting legal help. Pt states Dr. Magnus Ivan is the one who f...ed up his leg in the first place and he has until 12 to CB the pt before he gets legal help.

## 2021-06-23 ENCOUNTER — Other Ambulatory Visit: Payer: Self-pay | Admitting: Family Medicine

## 2021-06-23 DIAGNOSIS — L219 Seborrheic dermatitis, unspecified: Secondary | ICD-10-CM

## 2021-06-29 DIAGNOSIS — M5416 Radiculopathy, lumbar region: Secondary | ICD-10-CM | POA: Diagnosis not present

## 2021-06-29 DIAGNOSIS — X58XXXA Exposure to other specified factors, initial encounter: Secondary | ICD-10-CM | POA: Insufficient documentation

## 2021-06-29 DIAGNOSIS — Z96651 Presence of right artificial knee joint: Secondary | ICD-10-CM | POA: Insufficient documentation

## 2021-06-29 DIAGNOSIS — S32020A Wedge compression fracture of second lumbar vertebra, initial encounter for closed fracture: Secondary | ICD-10-CM | POA: Diagnosis not present

## 2021-06-29 DIAGNOSIS — S22080A Wedge compression fracture of T11-T12 vertebra, initial encounter for closed fracture: Secondary | ICD-10-CM | POA: Diagnosis not present

## 2021-06-29 DIAGNOSIS — F17220 Nicotine dependence, chewing tobacco, uncomplicated: Secondary | ICD-10-CM | POA: Insufficient documentation

## 2021-06-29 DIAGNOSIS — Z79899 Other long term (current) drug therapy: Secondary | ICD-10-CM | POA: Insufficient documentation

## 2021-06-29 DIAGNOSIS — E119 Type 2 diabetes mellitus without complications: Secondary | ICD-10-CM | POA: Diagnosis not present

## 2021-06-29 DIAGNOSIS — M545 Low back pain, unspecified: Secondary | ICD-10-CM | POA: Diagnosis not present

## 2021-06-29 DIAGNOSIS — S34102A Unspecified injury to L2 level of lumbar spinal cord, initial encounter: Secondary | ICD-10-CM | POA: Diagnosis present

## 2021-06-29 DIAGNOSIS — I10 Essential (primary) hypertension: Secondary | ICD-10-CM | POA: Insufficient documentation

## 2021-06-30 ENCOUNTER — Emergency Department (HOSPITAL_COMMUNITY): Payer: Medicare HMO

## 2021-06-30 ENCOUNTER — Encounter (HOSPITAL_COMMUNITY): Payer: Self-pay | Admitting: Emergency Medicine

## 2021-06-30 ENCOUNTER — Emergency Department (HOSPITAL_COMMUNITY)
Admission: EM | Admit: 2021-06-30 | Discharge: 2021-06-30 | Disposition: A | Discharge status: Home / Self Care | Payer: Medicare HMO | Attending: Emergency Medicine | Admitting: Emergency Medicine

## 2021-06-30 ENCOUNTER — Ambulatory Visit (HOSPITAL_COMMUNITY): Admission: RE | Admit: 2021-06-30 | Payer: Medicare HMO | Source: Ambulatory Visit

## 2021-06-30 DIAGNOSIS — M545 Low back pain, unspecified: Secondary | ICD-10-CM | POA: Diagnosis not present

## 2021-06-30 DIAGNOSIS — M5416 Radiculopathy, lumbar region: Secondary | ICD-10-CM

## 2021-06-30 DIAGNOSIS — Z96651 Presence of right artificial knee joint: Secondary | ICD-10-CM | POA: Diagnosis not present

## 2021-06-30 LAB — CBG MONITORING, ED: Glucose-Capillary: 121 mg/dL — ABNORMAL HIGH (ref 70–99)

## 2021-06-30 MED ORDER — METHOCARBAMOL 500 MG PO TABS
500.0000 mg | ORAL_TABLET | Freq: Once | ORAL | Status: AC
  Administered 2021-06-30: 05:00:00 500 mg via ORAL
  Filled 2021-06-30: qty 1

## 2021-06-30 MED ORDER — KETOROLAC TROMETHAMINE 30 MG/ML IJ SOLN
30.0000 mg | Freq: Once | INTRAMUSCULAR | Status: AC
  Administered 2021-06-30: 06:00:00 30 mg via INTRAMUSCULAR
  Filled 2021-06-30: qty 1

## 2021-06-30 MED ORDER — HYDROMORPHONE HCL 1 MG/ML IJ SOLN
1.0000 mg | Freq: Once | INTRAMUSCULAR | Status: AC
  Administered 2021-06-30: 05:00:00 1 mg via INTRAMUSCULAR
  Filled 2021-06-30: qty 1

## 2021-06-30 MED ORDER — HYDROMORPHONE HCL 1 MG/ML IJ SOLN
1.0000 mg | Freq: Once | INTRAMUSCULAR | Status: AC
  Administered 2021-06-30: 06:00:00 1 mg via INTRAMUSCULAR
  Filled 2021-06-30: qty 1

## 2021-06-30 NOTE — ED Notes (Signed)
Pt ambulated with steady gait both to and from the restroom.

## 2021-06-30 NOTE — ED Notes (Signed)
Pt irate and irritated, states he has been here since 10 pm and he needs his MRI. Informed pt MRI would be with him as soon as they could. Pt states "I was told they would be with me at 7 this morning." Pt yelling at RN. EDP made aware. Informed pt he would be leaving against medical advise. Pt commanded "Get me my discharge papers." Informed pt if he is leaving he is leaving against medical advise. Pt left with security present.

## 2021-06-30 NOTE — ED Provider Notes (Signed)
Pt signed out by Dr. Manus Gunning pending MRI.  Pt said he was told his MRI would be at 0700.  It is 0750 and he is irate that his MRI was not done exactly at 0700.  I went to talk to him and he is very angry and no longer wants to wait for the MRI.  I tried to explain that the MRI techs would be doing the more urgent people first, but he did not want to hear that.  He no longer wants to wait for his MRI and walked out.  Pt did not want to wait for his d/c papers.   Jacalyn Lefevre, MD 06/30/21 [phone]

## 2021-06-30 NOTE — ED Provider Notes (Addendum)
[facility] EMERGENCY DEPARTMENT Provider Note   CSN: [id number] Arrival date & time: 06/29/21  2330     History Chief Complaint  Patient presents with   Back Pain    Alex [name] is a 60 y.o. male.  Patient with a history of diabetes, chronic knee pain, hypertension presenting with right-sided back pain that radiates down his right leg.  States this started yesterday while he was "stretching".  He denies any direct fall or trauma. Soon after this he began to feel a pull in his right lower back with radiation of the pain down his buttock and down his leg.  He does have chronic knee pain and a previous knee replacement but this pain is different and more severe.  He has been using Tylenol at home as well as Voltaren gel without relief.  He was formally on Valium for his knee pain but states that makes him loopy and lose depth perception so he no longer takes it.  He feels weak in the leg but does not have any numbness or tingling.  There is no bowel or bladder incontinence.  No fevers, chills, nausea or vomiting.  No pain with urination or blood in the urine.  No chest pain or shortness of breath. No history of IV drug abuse or cancer. History of previous knee replacement as well as cervical fusion.  The history is provided by the patient.  Back Pain Associated symptoms: no abdominal pain, no chest pain, no dysuria, no fever, no headaches and no weakness       Past Medical History:  Diagnosis Date   Diabetes mellitus without complication ([place])    History of kidney stones    removed 5 months ago   Hypertension    PONV (postoperative nausea and vomiting)    AGITATION after anesthesia as well   Sleep apnea    NO CPAP    Patient Active Problem List   Diagnosis Date Noted   Hyperlipidemia associated with type 2 diabetes mellitus ([place]) 06/23/2020   Chronic neck pain 08/10/2019   Status post revision of total replacement of right knee 04/10/2019   Loose right total knee  arthroplasty ([place]) 03/09/2019   Pain in right knee 02/17/2019   Neuropathic pain 01/01/2019   Cervical spine degeneration 12/30/2018   Right wrist pain 12/19/2018   Right arm numbness 12/19/2018   Acquired trigger finger of left middle finger 08/22/2018   Pain of left hand 08/22/2018   S/P cervical spinal fusion 03/27/2018   Morbid obesity ([place]) 02/21/2018   Pain in joint of left shoulder 02/05/2018   Osteoarthritis of right glenohumeral joint 01/21/2018   Pain in joint of right shoulder 12/30/2017   Tear of right rotator cuff 12/17/2017   Recurrent nephrolithiasis 09/18/2017   Right flank pain 08/20/2017   Drug dependence ([place]) 08/19/2017   Diabetes mellitus type 2 in obese ([place]) 03/28/2017   Hypertension associated with diabetes ([place]) 08/25/2016    Past Surgical History:  Procedure Laterality Date   ANTERIOR CERVICAL DECOMP/DISCECTOMY FUSION N/A 03/27/2018   Procedure: ANTERIOR CERVICAL DECOMPRESSION/DISCECTOMY FUSION C3-5;  Surgeon: Melina Schools, MD;  Location: [facility] [place];  Service: Orthopedics;  Laterality: N/A;  3.5 hrs   arm surgery Right    x2 involving tendons/ulnar nerve   BACK SURGERY     BIOPSY  10/17/2020   Procedure: BIOPSY;  Surgeon: Eloise Harman, DO;  Location: [facility];  Service: Endoscopy;;   CARPAL TUNNEL RELEASE Right    clavical surgery  ESOPHAGOGASTRODUODENOSCOPY (EGD) WITH PROPOFOL N/A 10/17/2020   Procedure: ESOPHAGOGASTRODUODENOSCOPY (EGD) WITH PROPOFOL;  Surgeon: Eloise Harman, DO;  Location: [facility];  Service: Endoscopy;  Laterality: N/A;  PM   EYE SURGERY Bilateral    glaucoma   HERNIA REPAIR     JOINT REPLACEMENT Right 06/2011   TOTAL KNEE REVISION Right 04/10/2019   Procedure: RIGHT TOTAL KNEE REVISION;  Surgeon: Mcarthur Rossetti, MD;  Location: [facility];  Service: Orthopedics;  Laterality: Right;   TRICEPS TENDON REPAIR         Family History  Problem Relation Age of Onset   Cancer Mother 43       small intestinal    Diabetes Father    Hypertension Father    Heart attack Father    Colon cancer Neg Hx     Social History   Tobacco Use   Smoking status: Never   Smokeless tobacco: Current    Types: Chew  Vaping Use   Vaping Use: Never used  Substance Use Topics   Alcohol use: Not Currently    Comment: Quit EtOH 20 years ago   Drug use: No    Home Medications Prior to Admission medications   Medication Sig Start Date End Date Taking? Authorizing Provider  amLODipine (NORVASC) 5 MG tablet Take 2 tablets (10 mg total) by mouth daily. 12/19/20   Dettinger, Fransisca Kaufmann, MD  cyclobenzaprine (FLEXERIL) 10 MG tablet Take 1 tablet (10 mg total) by mouth 3 (three) times daily as needed for muscle spasms. 12/14/20   Mcarthur Rossetti, MD  diazepam (VALIUM) 5 MG tablet Take 1 tablet (5 mg total) by mouth every 12 (twelve) hours as needed for anxiety. 06/06/21   Mcarthur Rossetti, MD  diclofenac (VOLTAREN) 75 MG EC tablet Take 1 tablet (75 mg total) by mouth 2 (two) times daily. 03/08/21   Dettinger, Fransisca Kaufmann, MD  diclofenac Sodium (VOLTAREN) 1 % GEL Apply 2 g topically 4 (four) times daily. Patient taking differently: Apply 2 g topically daily as needed (Pain). 07/23/19   Dettinger, Fransisca Kaufmann, MD  dicyclomine (BENTYL) 10 MG capsule Take 1 capsule (10 mg total) by mouth 4 (four) times daily -  before meals and at bedtime. 10/12/20   Dettinger, Fransisca Kaufmann, MD  ketoconazole (NIZORAL) 2 % shampoo SHAMPOO, LATHER, LEAVE FOR 10 MINUTES, THEN RINSE. USE 2 TIMES A WEEK FOR UP TO 4 WEEKS 06/26/21   Dettinger, Fransisca Kaufmann, MD  lisinopril (ZESTRIL) 40 MG tablet TAKE 1 TABLET DAILY 01/06/21   Dettinger, Fransisca Kaufmann, MD  nabumetone (RELAFEN) 750 MG tablet Take 1 tablet (750 mg total) by mouth 2 (two) times daily as needed. 10/26/20   Mcarthur Rossetti, MD  oxymetazoline (AFRIN) 0.05 % nasal spray Place 1 spray into both nostrils every 6 (six) hours as needed for congestion.     [provider]  pantoprazole (PROTONIX)  40 MG tablet Take 1 tablet (40 mg total) by mouth 2 (two) times daily before a meal. 10/11/20   Mahala Menghini, PA-C  Semaglutide, 1 MG/DOSE, 4 MG/3ML SOPN Inject 1 mg as directed once a week. 05/24/21   Dettinger, Fransisca Kaufmann, MD  tiZANidine (ZANAFLEX) 4 MG tablet Take 1 tablet (4 mg total) by mouth every 8 (eight) hours as needed for muscle spasms. 10/26/20   Mcarthur Rossetti, MD    Allergies    Morphine and related, Buprenorphine hcl-naloxone hcl, and Codeine  Review of Systems   Review of Systems  Constitutional:  Negative for activity change, appetite change and fever.  HENT:  Negative for congestion and rhinorrhea.   Respiratory:  Negative for cough, chest tightness and shortness of breath.   Cardiovascular:  Negative for chest pain.  Gastrointestinal:  Negative for abdominal pain, nausea and vomiting.  Genitourinary:  Negative for dysuria and hematuria.  Musculoskeletal:  Positive for arthralgias, back pain and myalgias.  Skin:  Negative for rash.  Neurological:  Negative for dizziness, weakness and headaches.   all other systems are negative except as noted in the HPI and PMH.   Physical Exam Updated Vital Signs BP (!) 149/90 (BP Location: Right Arm)    Pulse 87    Temp 98 F (36.7 C) (Oral)    Resp 18    Ht 5\' 6"  (1.676 m)    Wt 102.1 kg    SpO2 97%    BMI 36.32 kg/m   Physical Exam Vitals and nursing note reviewed.  Constitutional:      General: He is not in acute distress.    Appearance: He is well-developed.  HENT:     Head: Normocephalic and atraumatic.     Mouth/Throat:     Pharynx: No oropharyngeal exudate.  Eyes:     Conjunctiva/sclera: Conjunctivae normal.     Pupils: Pupils are equal, round, and reactive to light.  Neck:     Comments: No meningismus. Cardiovascular:     Rate and Rhythm: Normal rate and regular rhythm.     Heart sounds: Normal heart sounds. No murmur heard. Pulmonary:     Effort: Pulmonary effort is normal. No respiratory distress.      Breath sounds: Normal breath sounds.  Chest:     Chest wall: No tenderness.  Abdominal:     Palpations: Abdomen is soft.     Tenderness: There is no abdominal tenderness. There is no guarding or rebound.  Musculoskeletal:        General: Tenderness present. Normal range of motion.     Cervical back: Normal range of motion and neck supple.     Comments: Postoperative changes to R knee with small effusion. Flexion and extension intact.   Right paraspinal lumbar tenderness. 5/5 strength in bilateral lower extremities. Ankle plantar and dorsiflexion intact. Great toe extension intact bilaterally. +2 DP and PT pulses. +2 patellar reflexes bilaterally. Antalgic gait.   Skin:    General: Skin is warm.  Neurological:     Mental Status: He is alert and oriented to person, place, and time.     Cranial Nerves: No cranial nerve deficit.     Motor: No abnormal muscle tone.     Coordination: Coordination normal.     Comments:  5/5 strength throughout. CN 2-12 intact.Equal grip strength.   Psychiatric:        Behavior: Behavior normal.    Alex Results / Procedures / Treatments   Labs (all labs ordered are listed, but only abnormal results are displayed) Labs Reviewed  CBG MONITORING, Alex - Abnormal; Notable for the following components:      Result Value   Glucose-Capillary 121 (*)    All other components within normal limits    EKG None  Radiology DG Lumbar Spine Complete  Result Date: 06/30/2021 CLINICAL DATA:  Back pain. EXAM: LUMBAR SPINE - COMPLETE 4+ VIEW COMPARISON:  None. FINDINGS: Status post left SI joint fusion. Mild superior endplate compression deformity at L2 is stable since abdomen CT 09/11/2020. T12 compression deformity also stable since 09/11/2020. Facets are well aligned bilaterally.  IMPRESSION: No acute bony abnormality. Stable compression deformity at T12 and L2 since CT scan 09/11/2020. Electronically Signed   By: Misty Stanley M.D.   On: 06/30/2021 06:13   DG Knee  Complete 4 Views Right  Result Date: 06/30/2021 CLINICAL DATA:  60 year old male with pain radiating down the right leg. Previous right knee arthroplasty and revision. EXAM: RIGHT KNEE - COMPLETE 4+ VIEW COMPARISON:  Right knee series 01/03/2021. FINDINGS: Right total knee arthroplasty. Both the femoral and tibial intramedullary components are not completely included, but the visible hardware appears intact and normally located. Chronic postoperative changes to the patella. Evidence of chronic joint effusion, stable to minimally increased since June. Calcified peripheral vascular disease. No acute osseous abnormality identified. IMPRESSION: 1. Right total knee arthroplasty, visible hardware intact. 2. Chronic joint effusion suspected, stable to minimally increased since June. 3.  No acute osseous abnormality identified. 4. Calcified peripheral vascular disease. Electronically Signed   By: Genevie Ann M.D.   On: 06/30/2021 06:10    Procedures Procedures   Medications Ordered in Alex Medications  HYDROmorphone (DILAUDID) injection 1 mg (has no administration in time range)  methocarbamol (ROBAXIN) tablet 500 mg (has no administration in time range)    Alex Course  I have reviewed the triage vital signs and the nursing notes.  Pertinent labs & imaging results that were available during my care of the patient were reviewed by me and considered in my medical decision making (see chart for details).    MDM Rules/Calculators/A&P                         Right-sided back pain with radiation of pain down his leg.  No direct trauma.  Intact distal strength, sensation, pulses and reflexes.  Low suspicion for cord compression or cauda equina Patient requesting Dilaudid by name.  X-ray shows stable compression fractures at T12 and L2.  No hardware failure seen.  Patient requesting additional doses of pain medication having much difficulty ambulating. No urinary symptoms. CT in March 2022 showed no evidence  of aortic aneurysm.  He has intact distal strength, sensation, pulses and reflexes.  While there is low clinical concern for cord compression or cauda equina, patient's pain is quite intractable so we will proceed with MRI imaging. Care to be transferred at shift change.    Final Clinical Impression(s) / Alex Diagnoses Final diagnoses:  Lumbar radiculopathy    Rx / DC Orders Alex Discharge Orders     None        Fia Hebert, Annie Main, MD 06/30/21 Hinton Rao, MD 06/30/21 [phone]

## 2021-06-30 NOTE — ED Triage Notes (Signed)
Pt arrives POV c/o lower back pain that goes down right leg.

## 2021-07-05 ENCOUNTER — Telehealth: Payer: Self-pay | Admitting: Family Medicine

## 2021-07-05 ENCOUNTER — Ambulatory Visit: Payer: Medicare HMO

## 2021-07-05 NOTE — Telephone Encounter (Signed)
Pt returned missed call. Please call pt back 

## 2021-07-05 NOTE — Telephone Encounter (Signed)
Called patient no mail box set up.

## 2021-07-05 NOTE — Telephone Encounter (Signed)
Mailbox not set up across 12/28

## 2021-07-07 NOTE — Telephone Encounter (Signed)
Returned patients call- requesting to speak to Alex [name] directly about Dr. Louanne Skye changing his medication.

## 2021-07-07 NOTE — Telephone Encounter (Signed)
Attempted to return call - NVM  Unsure what patient needs If he wants to re-enroll for ozempic patient assistance I need his most recent household proof of income Once re-enrolled --it can take up to 60 days to get in the mail Patient may have to fill at local pharmacy Would need to send in RX  Thanks

## 2021-07-07 NOTE — Telephone Encounter (Signed)
Attempted to return call- NVM  

## 2021-07-12 ENCOUNTER — Encounter: Payer: Self-pay | Admitting: *Deleted

## 2021-07-12 ENCOUNTER — Telehealth: Payer: Self-pay | Admitting: *Deleted

## 2021-07-12 ENCOUNTER — Other Ambulatory Visit: Payer: Self-pay | Admitting: Family Medicine

## 2021-07-12 DIAGNOSIS — I152 Hypertension secondary to endocrine disorders: Secondary | ICD-10-CM

## 2021-07-12 DIAGNOSIS — E1159 Type 2 diabetes mellitus with other circulatory complications: Secondary | ICD-10-CM

## 2021-07-12 NOTE — Telephone Encounter (Signed)
In fridge with name and DOB on bag

## 2021-07-12 NOTE — Telephone Encounter (Signed)
Ozempic 4 mg/3 ml #2 pens here ready for pick up - these are Pt assistance   NA and NO VM to pick up   Clinical please try again at later time

## 2021-07-12 NOTE — Telephone Encounter (Signed)
Contacted patient. He will come by to pick up his Ozempic today

## 2021-07-12 NOTE — Telephone Encounter (Signed)
Patient is coming in to pick up his Ozempic today

## 2021-07-13 ENCOUNTER — Ambulatory Visit (INDEPENDENT_AMBULATORY_CARE_PROVIDER_SITE_OTHER): Payer: Medicare HMO | Admitting: Family Medicine

## 2021-07-13 ENCOUNTER — Encounter: Payer: Self-pay | Admitting: Family Medicine

## 2021-07-13 VITALS — BP 157/92 | HR 89 | Ht 66.0 in | Wt 225.0 lb

## 2021-07-13 DIAGNOSIS — M5416 Radiculopathy, lumbar region: Secondary | ICD-10-CM | POA: Diagnosis not present

## 2021-07-13 NOTE — Progress Notes (Signed)
BP (!) 157/92    Pulse 89    Ht 5\' 6"  (1.676 m)    Wt 225 lb (102.1 kg)    SpO2 97%    BMI 36.32 kg/m    Subjective:   Patient ID: , male    DOB: 27-May-1961, 61 y.o.   MRN: [id number]  HPI: Alex [name] is a 61 y.o. male presenting on 07/13/2021 for Back Pain   HPI Back pain with radiation down right leg and buttocks Patient is coming in today complaining of back pain going down right leg and buttocks and this is been going on over the past couple weeks worse than he had it before.  He says it got worse over the break with him working at 09/10/2021 where he was lifting and twisting and turning different bowls and heavier items.  He says it has not improved.  He did go to the emergency department got some medicine for it and it has not gotten much better or worse but still about the same with pain radiating down to his right knee through his right buttocks and the posterior right back.   Relevant past medical, surgical, family and social history reviewed and updated as indicated. Interim medical history since our last visit reviewed. Allergies and medications reviewed and updated.  Review of Systems  Constitutional:  Negative for chills and fever.  Respiratory:  Negative for shortness of breath and wheezing.   Cardiovascular:  Negative for chest pain and leg swelling.  Musculoskeletal:  Positive for arthralgias, back pain and myalgias. Negative for gait problem.  Skin:  Negative for rash.  All other systems reviewed and are negative.  Per HPI unless specifically indicated above   Allergies as of 07/13/2021       Reactions   Morphine And Related Nausea Only   Buprenorphine Hcl-naloxone Hcl Nausea Only   Codeine Nausea Only, Other (See Comments)   per pt, "felt weird in my head"        Medication List        Accurate as of July 13, 2021 10:15 AM. If you have any questions, ask your nurse or doctor.          amLODipine 5 MG tablet Commonly known as:  NORVASC Take 2 tablets (10 mg total) by mouth daily.   cyclobenzaprine 10 MG tablet Commonly known as: FLEXERIL Take 1 tablet (10 mg total) by mouth 3 (three) times daily as needed for muscle spasms.   diazepam 5 MG tablet Commonly known as: VALIUM Take 1 tablet (5 mg total) by mouth every 12 (twelve) hours as needed for anxiety.   diclofenac 75 MG EC tablet Commonly known as: VOLTAREN Take 1 tablet (75 mg total) by mouth 2 (two) times daily.   diclofenac Sodium 1 % Gel Commonly known as: Voltaren Apply 2 g topically 4 (four) times daily. What changed:  when to take this reasons to take this   dicyclomine 10 MG capsule Commonly known as: Bentyl Take 1 capsule (10 mg total) by mouth 4 (four) times daily -  before meals and at bedtime.   ketoconazole 2 % shampoo Commonly known as: NIZORAL SHAMPOO, LATHER, LEAVE FOR 10 MINUTES, THEN RINSE. USE 2 TIMES A WEEK FOR UP TO 4 WEEKS   lisinopril 40 MG tablet Commonly known as: ZESTRIL TAKE 1 TABLET DAILY   nabumetone 750 MG tablet Commonly known as: RELAFEN Take 1 tablet (750 mg total) by mouth 2 (two) times daily as needed.  oxymetazoline 0.05 % nasal spray Commonly known as: AFRIN Place 1 spray into both nostrils every 6 (six) hours as needed for congestion.   pantoprazole 40 MG tablet Commonly known as: PROTONIX Take 1 tablet (40 mg total) by mouth 2 (two) times daily before a meal.   Semaglutide (1 MG/DOSE) 4 MG/3ML Sopn Inject 1 mg as directed once a week.   tiZANidine 4 MG tablet Commonly known as: Zanaflex Take 1 tablet (4 mg total) by mouth every 8 (eight) hours as needed for muscle spasms.         Objective:   BP (!) 157/92    Pulse 89    Ht 5\' 6"  (1.676 m)    Wt 225 lb (102.1 kg)    SpO2 97%    BMI 36.32 kg/m   Wt Readings from Last 3 Encounters:  07/13/21 225 lb (102.1 kg)  06/30/21 225 lb (102.1 kg)  05/24/21 229 lb (103.9 kg)    Physical Exam Vitals and nursing note reviewed.  Constitutional:       General: He is not in acute distress.    Appearance: He is well-developed. He is not diaphoretic.  Eyes:     General: No scleral icterus.    Conjunctiva/sclera: Conjunctivae normal.  Neck:     Thyroid: No thyromegaly.  Musculoskeletal:        General: Normal range of motion.     Lumbar back: Tenderness present. No deformity or bony tenderness. Negative right straight leg raise test and negative left straight leg raise test.       Back:  Skin:    General: Skin is warm and dry.     Findings: No rash.  Neurological:     Mental Status: He is alert.      Assessment & Plan:   Problem List Items Addressed This Visit   None Visit Diagnoses     Lumbar radiculopathy    -  Primary   Relevant Orders   MR Lumbar Spine Wo Contrast       Ordered back x-ray, still hurting him since going to the emergency department.  History of lumbar surgery and cervical spine surgeries.  Patient has lumbar radiculopathy. Follow up plan: Return if symptoms worsen or fail to improve.  Counseling provided for all of the vaccine components Orders Placed This Encounter  Procedures   MR Lumbar Spine Wo Contrast    05/26/21, MD [facility] 07/13/2021, 10:15 AM

## 2021-07-14 ENCOUNTER — Ambulatory Visit (INDEPENDENT_AMBULATORY_CARE_PROVIDER_SITE_OTHER): Payer: Medicare HMO | Admitting: *Deleted

## 2021-07-14 DIAGNOSIS — Z0000 Encounter for general adult medical examination without abnormal findings: Secondary | ICD-10-CM

## 2021-07-14 NOTE — Patient Instructions (Signed)
MEDICARE ANNUAL WELLNESS VISIT Health Maintenance Summary and Written Plan of Care  Alex [name] ,  Thank you for allowing me to perform your Medicare Annual Wellness Visit and for your ongoing commitment to your health.   Health Maintenance & Immunization History Health Maintenance  Topic Date Due   FOOT EXAM  11/25/2020   OPHTHALMOLOGY EXAM  07/14/2021 (Originally 12/27/1970)   COVID-19 Vaccine (3 - Booster for Janssen series) 07/30/2021 (Originally 09/13/2020)   Zoster Vaccines- Shingrix (1 of 2) 12/18/2021 (Originally 12/27/2010)   COLONOSCOPY (Pts 45-8yrs Insurance coverage will need to be confirmed)  05/24/2022 (Originally 12/26/2005)   HIV Screening  05/24/2022 (Originally 12/27/1975)   HEMOGLOBIN A1C  11/21/2021   TETANUS/TDAP  05/25/2031   INFLUENZA VACCINE  Completed   HPV VACCINES  Aged Out   Pneumococcal Vaccine 28-32 Years old  Discontinued   Hepatitis C Screening  Discontinued   Immunization History  Administered Date(s) Administered   Influenza,inj,Quad PF,6+ Mos 05/28/2018, 03/30/2019, 04/06/2020, 03/24/2021   Influenza-Unspecified 05/21/2017   Janssen (J&J) SARS-COV-2 Vaccination 11/09/2019   Moderna Sars-Covid-2 Vaccination 07/19/2020   Pneumococcal Polysaccharide-23 02/21/2018   Tdap 05/24/2021    These are the patient goals that we discussed:  Goals Addressed             This Visit's Progress    AWV       07/14/2021 AWV Goal: Fall Prevention  Over the next year, patient will decrease their risk for falls by: Using assistive devices, such as a cane or walker, as needed Identifying fall risks within their home and correcting them by: Removing throw rugs Adding handrails to stairs or ramps Removing clutter and keeping a clear pathway throughout the home Increasing light, especially at night Adding shower handles/bars Raising toilet seat Identifying potential personal risk factors for falls: Medication side effects Incontinence/urgency Vestibular  dysfunction Hearing loss Musculoskeletal disorders Neurological disorders Orthostatic hypotension  07/14/2021 AWV Goal: Diabetes Management  Patient will maintain an A1C level below 8.0 Patient will not develop any diabetic foot complications Patient will not experience any hypoglycemic episodes over the next 3 months Patient will notify our office of any CBG readings outside of the provider recommended range by calling [phone] Patient will adhere to provider recommendations for diabetes management  Patient Self Management Activities take all medications as prescribed and report any negative side effects monitor and record blood sugar readings as directed adhere to a low carbohydrate diet that incorporates lean proteins, vegetables, whole grains, low glycemic fruits check feet daily noting any sores, cracks, injuries, or callous formations see PCP or podiatrist if he notices any changes in his legs, feet, or toenails Patient will visit PCP and have an A1C level checked every 3 to 6 months as directed  have a yearly eye exam to monitor for vascular changes associated with diabetes and will request that the report be sent to his pcp.  consult with his PCP regarding any changes in his health or new or worsening symptoms          This is a list of Health Maintenance Items that are overdue or due now: Health Maintenance Due  Topic Date Due   FOOT EXAM  11/25/2020     Orders/Referrals Placed Today: No orders of the defined types were placed in this encounter.  (Contact our referral department at [phone] if you have not spoken with someone about your referral appointment within the next 5 days)    Follow-up Plan Follow-up with Dettinger, Elige Radon, MD as  planned Schedule Yearly Eye Exam

## 2021-07-14 NOTE — Progress Notes (Signed)
MEDICARE ANNUAL WELLNESS VISIT  07/14/2021  Telephone Visit Disclaimer This Medicare AWV was conducted by telephone due to national recommendations for restrictions regarding the COVID-19 Pandemic (e.g. social distancing).  I verified, using two identifiers, that I am speaking with Alex [name] or their authorized healthcare agent. I discussed the limitations, risks, security, and privacy concerns of performing an evaluation and management service by telephone and the potential availability of an in-person appointment in the future. The patient expressed understanding and agreed to proceed.  Location of Patient: Home Location of Provider (nurse):  [facility]  Subjective:    Alex [name] is a 61 y.o. male patient of Alex [name], Fransisca Kaufmann, MD who had a Medicare Annual Wellness Visit today via telephone. Alex [name] is Disabled and lives with their spouse. he has 2 children. he reports that he is socially active and does interact with friends/family regularly. he is minimally physically active and enjoys studies martial arts, and deep sea fishing.  Patient Care Team: Alex [name], Fransisca Kaufmann, MD as PCP - General (Family Medicine) Eloise Harman, DO as Consulting Physician (Gastroenterology) Lavera Guise, [facility] as Pharmacist (Family Medicine)  Advanced Directives 07/14/2021 06/30/2021 12/21/2020 10/17/2020 09/12/2020 09/11/2020 06/08/2020  Does Patient Have a Medical Advance Directive? No No No No No No No  Would patient like information on creating a medical advance directive? No - Patient declined No - Patient declined - No - Patient declined No - Patient declined - No - Patient declined    Hospital Utilization Over the Past 12 Months: # of hospitalizations or ER visits: 5 # of surgeries: 1-Endoscopy  Review of Systems    Patient reports that his overall health is better compared to last year.  Musculoskeletal ROS: positive for - pain in back - right, lower and knee -  bilateral  Patient Reported Readings (BP, Pulse, CBG, Weight, etc) none  Pain Assessment Pain : 0-10 Pain Score: 4  Pain Type: Acute pain Pain Location: Back Pain Orientation: Right, Lower     Current Medications & Allergies (verified) Allergies as of 07/14/2021       Reactions   Morphine And Related Nausea Only   Buprenorphine Hcl-naloxone Hcl Nausea Only   Codeine Nausea Only, Other (See Comments)   per pt, "felt weird in my head"        Medication List        Accurate as of July 14, 2021  8:48 AM. If you have any questions, ask your nurse or doctor.          amLODipine 5 MG tablet Commonly known as: NORVASC Take 2 tablets (10 mg total) by mouth daily.   cyclobenzaprine 10 MG tablet Commonly known as: FLEXERIL Take 1 tablet (10 mg total) by mouth 3 (three) times daily as needed for muscle spasms.   diazepam 5 MG tablet Commonly known as: VALIUM Take 1 tablet (5 mg total) by mouth every 12 (twelve) hours as needed for anxiety.   diclofenac 75 MG EC tablet Commonly known as: VOLTAREN Take 1 tablet (75 mg total) by mouth 2 (two) times daily.   diclofenac Sodium 1 % Gel Commonly known as: Voltaren Apply 2 g topically 4 (four) times daily. What changed:  when to take this reasons to take this   dicyclomine 10 MG capsule Commonly known as: Bentyl Take 1 capsule (10 mg total) by mouth 4 (four) times daily -  before meals and at bedtime.   ketoconazole 2 % shampoo Commonly known  as: NIZORAL SHAMPOO, LATHER, LEAVE FOR 10 MINUTES, THEN RINSE. USE 2 TIMES A WEEK FOR UP TO 4 WEEKS   lisinopril 40 MG tablet Commonly known as: ZESTRIL TAKE 1 TABLET DAILY   nabumetone 750 MG tablet Commonly known as: RELAFEN Take 1 tablet (750 mg total) by mouth 2 (two) times daily as needed.   oxymetazoline 0.05 % nasal spray Commonly known as: AFRIN Place 1 spray into both nostrils every 6 (six) hours as needed for congestion.   pantoprazole 40 MG tablet Commonly  known as: PROTONIX Take 1 tablet (40 mg total) by mouth 2 (two) times daily before a meal.   Semaglutide (1 MG/DOSE) 4 MG/3ML Sopn Inject 1 mg as directed once a week.   tiZANidine 4 MG tablet Commonly known as: Zanaflex Take 1 tablet (4 mg total) by mouth every 8 (eight) hours as needed for muscle spasms.        History (reviewed): Past Medical History:  Diagnosis Date   Diabetes mellitus without complication ([place])    History of kidney stones    removed 5 months ago   Hypertension    PONV (postoperative nausea and vomiting)    AGITATION after anesthesia as well   Sleep apnea    NO CPAP   Past Surgical History:  Procedure Laterality Date   ANTERIOR CERVICAL DECOMP/DISCECTOMY FUSION N/A 03/27/2018   Procedure: ANTERIOR CERVICAL DECOMPRESSION/DISCECTOMY FUSION C3-5;  Surgeon: Melina Schools, MD;  Location: [place];  Service: Orthopedics;  Laterality: N/A;  3.5 hrs   arm surgery Right    x2 involving tendons/ulnar nerve   BACK SURGERY     BIOPSY  10/17/2020   Procedure: BIOPSY;  Surgeon: Eloise Harman, DO;  [facility]: [facility];  Service: Endoscopy;;   CARPAL TUNNEL RELEASE Right    clavical surgery     ESOPHAGOGASTRODUODENOSCOPY (EGD) WITH PROPOFOL N/A 10/17/2020   Procedure: ESOPHAGOGASTRODUODENOSCOPY (EGD) WITH PROPOFOL;  Surgeon: Eloise Harman, DO;  Location: [facility];  Service: Endoscopy;  Laterality: N/A;  PM   EYE SURGERY Bilateral    glaucoma   HERNIA REPAIR     JOINT REPLACEMENT Right 06/2011   TOTAL KNEE REVISION Right 04/10/2019   Procedure: RIGHT TOTAL KNEE REVISION;  Surgeon: Mcarthur Rossetti, MD;  Location: [facility];  Service: Orthopedics;  Laterality: Right;   TRICEPS TENDON REPAIR     Family History  Problem Relation Age of Onset   Cancer Mother 81       small intestinal   Diabetes Father    Hypertension Father    Heart attack Father    Early death Sister    Brain cancer Brother    Colon cancer Neg Hx    Social History    Socioeconomic History   Marital status: Married    Spouse name: Not on file   Number of children: 2   Years of education: 12   Highest education level: 12th grade  Occupational History   Not on file  Tobacco Use   Smoking status: Never   Smokeless tobacco: Current    Types: Chew  Vaping Use   Vaping Use: Never used  Substance and Sexual Activity   Alcohol use: Not Currently    Comment: Quit EtOH 20 years ago   Drug use: No   Sexual activity: Not on file  Other Topics Concern   Not on file  Social History Narrative   Not on file   Social Determinants of Health   Financial Resource Strain:  Not on file  Food Insecurity: Not on file  Transportation Needs: Not on file  Physical Activity: Not on file  Stress: Not on file  Social Connections: Not on file    Activities of Daily Living In your present state of health, do you have any difficulty performing the following activities: 07/14/2021  Hearing? N  Vision? Y  Comment Wears reading glasses  Difficulty concentrating or making decisions? N  Walking or climbing stairs? Y  Comment Due to knees  Dressing or bathing? N  Doing errands, shopping? N  Preparing Food and eating ? N  Using the Toilet? N  In the past six months, have you accidently leaked urine? N  Do you have problems with loss of bowel control? N  Managing your Medications? N  Managing your Finances? N  Housekeeping or managing your Housekeeping? N  Some recent data might be hidden    Patient Education/ Literacy How often do you need to have someone help you when you read instructions, pamphlets, or other written materials from your doctor or pharmacy?: 1 - Never  Exercise Current Exercise Habits: Home exercise routine, Type of exercise: walking, Time (Minutes): 30, Frequency (Times/Week): 3, Weekly Exercise (Minutes/Week): 90, Intensity: Mild, Exercise limited by: orthopedic condition(s)  Diet Patient reports consuming 3 meals a day and 2 snack(s) a  day Patient reports that his primary diet is: Regular, Diabetic Patient reports that she does have regular access to food.   Depression Screen PHQ 2/9 Scores 07/14/2021 07/13/2021 05/24/2021 05/24/2021 03/24/2021 02/23/2021 01/18/2021  PHQ - 2 Score 2 2 2  0 - 0 0  PHQ- 9 Score 6 6 5  - - 4 4  Exception Documentation - - - - Patient refusal - -     Fall Risk Fall Risk  07/14/2021 07/13/2021 05/24/2021 03/24/2021 02/23/2021  Falls in the past year? 0 0 0 0 0  Number falls in past yr: 0 - - - -  Injury with Fall? 0 - - - -  Comment - - - - -  Risk for fall due to : No Fall Risks - - - -  Follow up Falls prevention discussed - - - -     Objective:  Alex [name] seemed alert and oriented and he participated appropriately during our telephone visit.  Blood Pressure Weight BMI  BP Readings from Last 3 Encounters:  07/13/21 (!) 157/92  06/30/21 121/70  05/24/21 131/71   Wt Readings from Last 3 Encounters:  07/13/21 225 lb (102.1 kg)  06/30/21 225 lb (102.1 kg)  05/24/21 229 lb (103.9 kg)   BMI Readings from Last 1 Encounters:  07/13/21 36.32 kg/m    *Unable to obtain current vital signs, weight, and BMI due to telephone visit type  Hearing/Vision  Alex [name] did not seem to have difficulty with hearing/understanding during the telephone conversation Reports that he has not had a formal eye exam by an eye care professional within the past year Reports that he has not had a formal hearing evaluation within the past year *Unable to fully assess hearing and vision during telephone visit type  Cognitive Function: 6CIT Screen 07/14/2021  What Year? 0 points  What month? 0 points  What time? 0 points  Count back from 20 0 points  Months in reverse 0 points  Repeat phrase 0 points  Total Score 0   (Normal:0-7, Significant for Dysfunction: >8)  Normal Cognitive Function Screening: Yes   Immunization & Health Maintenance Record Immunization History  Administered Date(s) Administered  Influenza,inj,Quad PF,6+ Mos 05/28/2018, 03/30/2019, 04/06/2020, 03/24/2021   Influenza-Unspecified 05/21/2017   Janssen (J&J) SARS-COV-2 Vaccination 11/09/2019   Moderna Sars-Covid-2 Vaccination 07/19/2020   Pneumococcal Polysaccharide-23 02/21/2018   Tdap 05/24/2021    Health Maintenance  Topic Date Due   FOOT EXAM  11/25/2020   OPHTHALMOLOGY EXAM  07/14/2021 (Originally 12/27/1970)   COVID-19 Vaccine (3 - Booster for Janssen series) 07/30/2021 (Originally 09/13/2020)   Zoster Vaccines- Shingrix (1 of 2) 12/18/2021 (Originally 12/27/2010)   COLONOSCOPY (Pts 45-22yrs Insurance coverage will need to be confirmed)  05/24/2022 (Originally 12/26/2005)   HIV Screening  05/24/2022 (Originally 12/27/1975)   HEMOGLOBIN A1C  11/21/2021   TETANUS/TDAP  05/25/2031   INFLUENZA VACCINE  Completed   HPV VACCINES  Aged Out   Pneumococcal Vaccine 28-3 Years old  Discontinued   Hepatitis C Screening  Discontinued       Assessment  This is a routine wellness examination for Alex [name].  Health Maintenance: Due or Overdue Health Maintenance Due  Topic Date Due   FOOT EXAM  11/25/2020    Alex [name] does not need a referral for Community Assistance: Care Management:   no Social Work:    no Prescription Assistance:  no Nutrition/Diabetes Education:  no   Plan:    Goals      AWV     07/14/2021 AWV Goal: Fall Prevention  Over the next year, patient will decrease their risk for falls by: Using assistive devices, such as a cane or walker, as needed Identifying fall risks within their home and correcting them by: Removing throw rugs Adding handrails to stairs or ramps Removing clutter and keeping a clear pathway throughout the home Increasing light, especially at night Adding shower handles/bars Raising toilet seat Identifying potential personal risk factors for falls: Medication side effects Incontinence/urgency Vestibular dysfunction Hearing loss Musculoskeletal  disorders Neurological disorders Orthostatic hypotension  07/14/2021 AWV Goal: Diabetes Management  Patient will maintain an A1C level below 8.0 Patient will not develop any diabetic foot complications Patient will not experience any hypoglycemic episodes over the next 3 months Patient will notify our office of any CBG readings outside of the provider recommended range by calling [phone] Patient will adhere to provider recommendations for diabetes management  Patient Self Management Activities take all medications as prescribed and report any negative side effects monitor and record blood sugar readings as directed adhere to a low carbohydrate diet that incorporates lean proteins, vegetables, whole grains, low glycemic fruits check feet daily noting any sores, cracks, injuries, or callous formations see PCP or podiatrist if he notices any changes in his legs, feet, or toenails Patient will visit PCP and have an A1C level checked every 3 to 6 months as directed  have a yearly eye exam to monitor for vascular changes associated with diabetes and will request that the report be sent to his pcp.  consult with his PCP regarding any changes in his health or new or worsening symptoms          Personalized Health Maintenance & Screening Recommendations  Diabetic Foot Exam, Yearly Eye Exam, Hearing Exam  Lung Cancer Screening Recommended: no (Low Dose CT Chest recommended if Age 60-80 years, 30 pack-year currently smoking OR have quit w/in past 15 years) Hepatitis C Screening recommended: no HIV Screening recommended: no  Advanced Directives: Written information was not prepared per patient's request.  Referrals & Orders No orders of the defined types were placed in this encounter.   Follow-up Plan Follow-up with Alex [name], Fransisca Kaufmann,  MD as planned Schedule Yearly Eye Exam   I have personally reviewed and noted the following in the patients chart:   Medical and social  history Use of alcohol, tobacco or illicit drugs  Current medications and supplements Functional ability and status Nutritional status Physical activity Advanced directives List of other physicians Hospitalizations, surgeries, and ER visits in previous 12 months Vitals Screenings to include cognitive, depression, and falls Referrals and appointments  In addition, I have reviewed and discussed with Alex [name] certain preventive protocols, quality metrics, and best practice recommendations. A written personalized care plan for preventive services as well as general preventive health recommendations is available and can be mailed to the patient at his request.      Lynnea Ferrier, LPN  [id number]

## 2021-07-20 ENCOUNTER — Ambulatory Visit (INDEPENDENT_AMBULATORY_CARE_PROVIDER_SITE_OTHER): Payer: Medicare HMO | Admitting: Pharmacist

## 2021-07-20 DIAGNOSIS — E1169 Type 2 diabetes mellitus with other specified complication: Secondary | ICD-10-CM

## 2021-07-20 DIAGNOSIS — E1159 Type 2 diabetes mellitus with other circulatory complications: Secondary | ICD-10-CM

## 2021-07-20 DIAGNOSIS — E669 Obesity, unspecified: Secondary | ICD-10-CM

## 2021-07-20 NOTE — Progress Notes (Signed)
Chronic Care Management Pharmacy Note  07/20/2021 Name:  Alex [name] MRN:  [id number] DOB:  [birth year]  Summary: T2DM, HTN  Recommendations/Changes made from today's visit: Diabetes: New goal. Controlled-A1c 6.0%, GFR>100; current treatment:OZEMPIC 0.5MG WEEKLY;  We are increasing dose to Ozempic 35m weekly for weight loss benefits Patient is stable and doing well on ozempic Denies personal and family history of Medullary thyroid cancer (MTC) Application submitted to [employer] patient assistance program (ozempic 157m Current glucose readings: fasting glucose: <120, post prandial glucose: <170 Denies hypoglycemic/hyperglycemic symptoms Discussed meal planning options and Plate method for healthy eating Avoid sugary drinks and desserts Incorporate balanced protein, non starchy veggies, 1 serving of carbohydrate with each meal Increase water intake Increase physical activity as able Current exercise: n/a; encouraged-has back pain/issues Recommended increase ozempic Assessed patient finances. Application submitted to novo nordisk patient assistance program  Hypertension:  New goal. Uncontrolled; current treatment:amlodipine, lisinopril 4063m Current home readings: 140-150s SBP Denies hypertensive symptoms Educated on DASH diet; weight loss Recommended close follow up; may alter regimen at f/u; patient to try diet/lifestyle   Patient Goals/Self-Care Activities patient will:  - take medications as prescribed as evidenced by patient report and record review check glucose DAILY OR IF SYMPTOMATIC, document, and provide at future appointments check blood pressure 3X WEEKLY OR IF SYMPTOMATIC, document, and provide at future appointments collaborate with provider on medication access solutions target a minimum of 150 minutes of moderate intensity exercise weekly engage in dietary modifications by  FOLLOWING A HEART HEALTHY DIET/HEALTHY PLATE METHOD  Plan: F/U  3MONTHS  Subjective: Alex [name] an 60 34o. year old male who is a primary patient of Dettinger, JosFransisca KaufmannD.  The CCM team was consulted for assistance with disease management and care coordination needs.    Engaged with patient face to face for initial visit in response to provider referral for pharmacy case management and/or care coordination services.   Consent to Services:  The patient was given the following information about Chronic Care Management services today, agreed to services, and gave verbal consent: 1. CCM service includes personalized support from designated clinical staff supervised by the primary care provider, including individualized plan of care and coordination with other care providers 2. 24/7 contact phone numbers for assistance for urgent and routine care needs. 3. Service will only be billed when office clinical staff spend 20 minutes or more in a month to coordinate care. 4. Only one practitioner may furnish and bill the service in a calendar month. 5.The patient may stop CCM services at any time (effective at the end of the month) by phone call to the office staff. 6. The patient will be responsible for cost sharing (co-pay) of up to 20% of the service fee (after annual deductible is met). Patient agreed to services and consent obtained.  Patient Care Team: Dettinger, JosFransisca KaufmannD as PCP - General (Family Medicine) CarEloise HarmanO as Consulting Physician (Gastroenterology) [facility] Pharmacist (Family Medicine)    Objective:  Lab Results  Component Value Date   CREATININE 0.80 05/24/2021   CREATININE 1.02 09/11/2020   CREATININE 0.84 06/23/2020    Lab Results  Component Value Date   HGBA1C 6.0 (H) 05/24/2021   Last diabetic Eye exam: No results found for: HMDIABEYEEXA  Last diabetic Foot exam: No results found for: HMDIABFOOTEX      Component Value Date/Time   CHOL 133 05/24/2021 1242   TRIG 151 (H) 05/24/2021 1242  HDL 49  05/24/2021 1242   CHOLHDL 2.7 05/24/2021 1242   LDLCALC 58 05/24/2021 1242    Hepatic Function Latest Ref Rng & Units 05/24/2021 09/11/2020 06/23/2020  Total Protein 6.0 - 8.5 g/dL 6.6 6.4(L) 6.3  Albumin 3.8 - 4.9 g/dL 4.5 3.8 4.4  AST 0 - 40 IU/L _0 ALT 0 - 44 IU/L 36 38 46(H)  Alk Phosphatase 44 - 121 IU/L 83 62 92  Total Bilirubin 0.0 - 1.2 mg/dL 0.3 0.5 0.4    Lab Results  Component Value Date/Time   TSH 1.180 05/24/2021 12:42 PM    CBC Latest Ref Rng & Units 05/24/2021 09/11/2020 06/23/2020  WBC 3.4 - 10.8 x10E3/uL 8.0 9.4 8.1  Hemoglobin 13.0 - 17.7 g/dL 13.3 13.2 13.8  Hematocrit 37.5 - 51.0 % 39.8 39.5 41.0  Platelets 150 - 450 x10E3/uL 210 203 244    No results found for: VD25OH  Clinical ASCVD: No  The 10-year ASCVD risk score (Arnett DK, et al., 2019) is: 18.4%   Values used to calculate the score:     Age: 52 years     Sex: Male     Is Non-Hispanic African American: No     Diabetic: Yes     Tobacco smoker: No     Systolic Blood Pressure: 244 mmHg     Is BP treated: Yes     HDL Cholesterol: 49 mg/dL     Total Cholesterol: 133 mg/dL    Other: (CHADS2VASc if Afib, PHQ9 if depression, MMRC or CAT for COPD, ACT, DEXA)  Social History   Tobacco Use  Smoking Status Never  Smokeless Tobacco Current   Types: Chew   BP Readings from Last 3 Encounters:  07/13/21 (!) 157/92  06/30/21 121/70  05/24/21 131/71   Pulse Readings from Last 3 Encounters:  07/13/21 89  06/30/21 88  05/24/21 (!) 59   Wt Readings from Last 3 Encounters:  07/13/21 225 lb (102.1 kg)  06/30/21 225 lb (102.1 kg)  05/24/21 229 lb (103.9 kg)    Assessment: Review of patient past medical history, allergies, medications, health status, including review of consultants reports, laboratory and other test data, was performed as part of comprehensive evaluation and provision of chronic care management services.   SDOH:  (Social Determinants of Health) assessments and interventions  performed:    CCM Care Plan  Allergies  Allergen Reactions   Morphine And Related Nausea Only   Buprenorphine Hcl-Naloxone Hcl Nausea Only   Codeine Nausea Only and Other (See Comments)    per pt, "felt weird in my head"    Medications Reviewed Today     Reviewed by Lavera Guise, [facility] (Pharmacist) on 07/20/21 at 1303  Med List Status: <None>   Medication Order Taking? Sig Documenting Provider Last Dose Status Informant  amLODipine (NORVASC) 5 MG tablet [id number] No Take 2 tablets (10 mg total) by mouth daily. Dettinger, Fransisca Kaufmann, MD Taking Active   cyclobenzaprine (FLEXERIL) 10 MG tablet [id number] No Take 1 tablet (10 mg total) by mouth 3 (three) times daily as needed for muscle spasms. Mcarthur Rossetti, MD Taking Active   diazepam (VALIUM) 5 MG tablet [id number] No Take 1 tablet (5 mg total) by mouth every 12 (twelve) hours as needed for anxiety. Mcarthur Rossetti, MD Taking Active   diclofenac (VOLTAREN) 75 MG EC tablet [id number] No Take 1 tablet (75 mg total) by mouth 2 (two) times daily. Dettinger, Fransisca Kaufmann, MD Taking Active   diclofenac  Sodium (VOLTAREN) 1 % GEL [id number] No Apply 2 g topically 4 (four) times daily.  Patient taking differently: Apply 2 g topically daily as needed (Pain).   Dettinger, Fransisca Kaufmann, MD Taking Active   dicyclomine (BENTYL) 10 MG capsule [id number] No Take 1 capsule (10 mg total) by mouth 4 (four) times daily -  before meals and at bedtime. Dettinger, Fransisca Kaufmann, MD Taking Active            Med Note Gentry Roch   Thu Oct 13, 2020  8:35 AM) Have not started  ketoconazole (NIZORAL) 2 % shampoo [id number] No SHAMPOO, LATHER, LEAVE FOR 10 MINUTES, THEN RINSE. USE 2 TIMES A WEEK FOR UP TO 4 WEEKS Dettinger, Fransisca Kaufmann, MD Taking Active   lisinopril (ZESTRIL) 40 MG tablet [id number] No TAKE 1 TABLET DAILY Dettinger, Fransisca Kaufmann, MD Taking Active   nabumetone (RELAFEN) 750 MG tablet [id number] No Take 1 tablet (750 mg total) by mouth 2 (two) times daily  as needed. Mcarthur Rossetti, MD Taking Active   oxymetazoline (AFRIN) 0.05 % nasal spray [id number] No Place 1 spray into both nostrils every 6 (six) hours as needed for congestion.  [provider] Taking Active Self  pantoprazole (PROTONIX) 40 MG tablet [id number] No Take 1 tablet (40 mg total) by mouth 2 (two) times daily before a meal. Mahala Menghini, PA-C Taking Active   Semaglutide, 1 MG/DOSE, 4 MG/3ML SOPN [id number] No Inject 1 mg as directed once a week. Dettinger, Fransisca Kaufmann, MD Taking Active            Med Note Blanca Friend, Sherian Maroon Jul 20, 2021  1:02 PM) Via novo nordisk patient assistance program  tiZANidine (ZANAFLEX) 4 MG tablet [id number] No Take 1 tablet (4 mg total) by mouth every 8 (eight) hours as needed for muscle spasms. Mcarthur Rossetti, MD Taking Active             Patient Active Problem List   Diagnosis Date Noted   Hyperlipidemia associated with type 2 diabetes mellitus ([place]) 06/23/2020   Status post revision of total replacement of right knee 04/10/2019   Loose right total knee arthroplasty ([place]) 03/09/2019   Pain in right knee 02/17/2019   Neuropathic pain 01/01/2019   Cervical spine degeneration 12/30/2018   Right wrist pain 12/19/2018   Right arm numbness 12/19/2018   Acquired trigger finger of left middle finger 08/22/2018   S/P cervical spinal fusion 03/27/2018   Morbid obesity ([place]) 02/21/2018   Pain in joint of left shoulder 02/05/2018   Osteoarthritis of right glenohumeral joint 01/21/2018   Pain in joint of right shoulder 12/30/2017   Tear of right rotator cuff 12/17/2017   Recurrent nephrolithiasis 09/18/2017   Drug dependence ([place]) 08/19/2017   Diabetes mellitus type 2 in obese ([place]) 03/28/2017   Hypertension associated with diabetes ([place]) 08/25/2016    Immunization History  Administered Date(s) Administered   Influenza,inj,Quad PF,6+ Mos 05/28/2018, 03/30/2019, 04/06/2020, 03/24/2021   Influenza-Unspecified 05/21/2017    Janssen (J&J) SARS-COV-2 Vaccination 11/09/2019   Moderna Sars-Covid-2 Vaccination 07/19/2020   Pneumococcal Polysaccharide-23 02/21/2018   Tdap 05/24/2021    Conditions to be addressed/monitored: HTN and DMII  Care Plan : PHARMD MEDICATION MANAGEMENT  Updates made by Lavera Guise, [place] since 07/20/2021 12:00 AM     Problem: DISEASE PROGRESSION PREVENTION      Long-Range Goal: T2DM, HLD   This Visit's Progress: Not on track  Note:   Current Barriers:  Unable to  independently afford treatment regimen Unable to maintain control of T2DM, HLD, HTN  Pharmacist Clinical Goal(s):  patient will verbalize ability to afford treatment regimen maintain control of T2DM, HLD, HTN as evidenced by GOAL A1C<7%, LDL<70, BP<130/80  through collaboration with PharmD and provider.    Interventions: 1:1 collaboration with Dettinger, Fransisca Kaufmann, MD regarding development and update of comprehensive plan of care as evidenced by provider attestation and co-signature Inter-disciplinary care team collaboration (see longitudinal plan of care) Comprehensive medication review performed; medication list updated in electronic medical record  Diabetes: New goal. Controlled-A1c 6.0%, GFR>100; current treatment:OZEMPIC 0.5MG WEEKLY;  We are increasing dose to Ozempic 79m weekly for weight loss benefits Patient is stable and doing well on ozempic Denies personal and family history of Medullary thyroid cancer (MTC) Application submitted to novo nordisk patient assistance program (ozempic 152m Current glucose readings: fasting glucose: <120, post prandial glucose: <170 Denies hypoglycemic/hyperglycemic symptoms Discussed meal planning options and Plate method for healthy eating Avoid sugary drinks and desserts Incorporate balanced protein, non starchy veggies, 1 serving of carbohydrate with each meal Increase water intake Increase physical activity as able Current exercise: n/a; encouraged-has back  pain/issues Recommended increase ozempic Assessed patient finances. Application submitted to novo nordisk patient assistance program  Hypertension:  New goal. Uncontrolled; current treatment:amlodipine, lisinopril 4030m Current home readings: 140-150s SBP Denies hypertensive symptoms Educated on DASH diet; weight loss Recommended close follow up; may alter regimen at f/u; patient to try diet/lifestyle   Patient Goals/Self-Care Activities patient will:  - take medications as prescribed as evidenced by patient report and record review check glucose DAILY OR IF SYMPTOMATIC, document, and provide at future appointments check blood pressure 3X WEEKLY OR IF SYMPTOMATIC, document, and provide at future appointments collaborate with provider on medication access solutions target a minimum of 150 minutes of moderate intensity exercise weekly engage in dietary modifications by    FOLLOWING A HEART HEALTHY DIET/HEALTHY PLATE METHOD       Medication Assistance: Application [place] in [place].  Anticipated assistance start date TBD.  See plan of care for additional detail.  Patient's preferred pharmacy is:  [id number]: [phone]: [phone] Up:  Patient agrees to Care Plan and Follow-up.  Plan: Telephone follow up appointment with care management team member scheduled for:  3 MONTHS   JulRegina EckharmD, BCPS Clinical Pharmacist, [place] Phone [phone]

## 2021-07-20 NOTE — Patient Instructions (Addendum)
Visit Information  Thank you for taking time to visit with me today. Please don't hesitate to contact me if I can be of assistance to you before our next scheduled telephone appointment.  Following are the goals we discussed today:  Current Barriers:  Unable to independently afford treatment regimen Unable to maintain control of T2DM, HLD, HTN  Pharmacist Clinical Goal(s):  patient will verbalize ability to afford treatment regimen maintain control of T2DM, HLD, HTN as evidenced by GOAL A1C<7%, LDL<70, BP<130/80  through collaboration with PharmD and provider.    Interventions: 1:1 collaboration with Dettinger, Elige Radon, MD regarding development and update of comprehensive plan of care as evidenced by provider attestation and co-signature Inter-disciplinary care team collaboration (see longitudinal plan of care) Comprehensive medication review performed; medication list updated in electronic medical record  Diabetes: New goal. Controlled-A1c 6.0%, GFR>100; current treatment:OZEMPIC 0.5MG  WEEKLY;  We are increasing dose to Ozempic 1mg  weekly for weight loss benefits Patient is stable and doing well on ozempic Denies personal and family history of Medullary thyroid cancer (MTC) Application submitted to novo nordisk patient assistance program (ozempic 1mg ) Current glucose readings: fasting glucose: <120, post prandial glucose: <170 Denies hypoglycemic/hyperglycemic symptoms Discussed meal planning options and Plate method for healthy eating Avoid sugary drinks and desserts Incorporate balanced protein, non starchy veggies, 1 serving of carbohydrate with each meal Increase water intake Increase physical activity as able Current exercise: n/a; encouraged-has back pain/issues Recommended increase ozempic Assessed patient finances. Application submitted to novo nordisk patient assistance program  Hypertension:  New goal. Uncontrolled; current treatment:amlodipine, lisinopril 40mg ;   Current home readings: 140-150s SBP Denies hypertensive symptoms Educated on DASH diet; weight loss Recommended close follow up; may alter regimen at f/u; patient to try diet/lifestyle   Patient Goals/Self-Care Activities patient will:  - take medications as prescribed as evidenced by patient report and record review check glucose DAILY OR IF SYMPTOMATIC, document, and provide at future appointments check blood pressure 3X WEEKLY OR IF SYMPTOMATIC, document, and provide at future appointments collaborate with provider on medication access solutions target a minimum of 150 minutes of moderate intensity exercise weekly engage in dietary modifications by  FOLLOWING A HEART HEALTHY DIET/HEALTHY PLATE METHOD     Please call the care guide team at [phone] if you need to cancel or reschedule your appointment.   If you are experiencing a Mental Health or Behavioral Health Crisis or need someone to talk to, please call the Suicide and Crisis Lifeline: 988   The patient verbalized understanding of instructions, educational materials, and care plan provided today and agreed to receive a mailed copy of patient instructions, educational materials, and care plan.   Signature , PharmD, BCPS Clinical Pharmacist, [phone] Family Medicine [facility]  [place] Phone [phone]

## 2021-07-25 ENCOUNTER — Ambulatory Visit (HOSPITAL_COMMUNITY): Payer: Medicare HMO

## 2021-07-27 ENCOUNTER — Other Ambulatory Visit: Payer: Self-pay

## 2021-07-27 ENCOUNTER — Ambulatory Visit (HOSPITAL_COMMUNITY)
Admission: RE | Admit: 2021-07-27 | Discharge: 2021-07-27 | Disposition: A | Discharge status: Home / Self Care | Payer: Medicare HMO | Source: Ambulatory Visit | Attending: Family Medicine | Admitting: Family Medicine

## 2021-07-27 DIAGNOSIS — M5416 Radiculopathy, lumbar region: Secondary | ICD-10-CM | POA: Insufficient documentation

## 2021-07-27 DIAGNOSIS — M5126 Other intervertebral disc displacement, lumbar region: Secondary | ICD-10-CM | POA: Diagnosis not present

## 2021-07-27 DIAGNOSIS — M4726 Other spondylosis with radiculopathy, lumbar region: Secondary | ICD-10-CM | POA: Diagnosis not present

## 2021-07-31 ENCOUNTER — Encounter: Payer: Self-pay | Admitting: Orthopaedic Surgery

## 2021-07-31 ENCOUNTER — Other Ambulatory Visit: Payer: Self-pay

## 2021-07-31 ENCOUNTER — Ambulatory Visit (INDEPENDENT_AMBULATORY_CARE_PROVIDER_SITE_OTHER): Payer: Medicare HMO | Admitting: Orthopaedic Surgery

## 2021-07-31 ENCOUNTER — Ambulatory Visit: Payer: Self-pay

## 2021-07-31 DIAGNOSIS — Z96651 Presence of right artificial knee joint: Secondary | ICD-10-CM | POA: Diagnosis not present

## 2021-07-31 DIAGNOSIS — G8929 Other chronic pain: Secondary | ICD-10-CM | POA: Diagnosis not present

## 2021-07-31 DIAGNOSIS — M5441 Lumbago with sciatica, right side: Secondary | ICD-10-CM

## 2021-07-31 DIAGNOSIS — R6889 Other general symptoms and signs: Secondary | ICD-10-CM | POA: Diagnosis not present

## 2021-07-31 NOTE — Progress Notes (Signed)
°  The patient is a long-term patient of ours.  We performed a revision arthroplasty of a loose right total knee replacement in late 2020.  The original knee replacement was provided elsewhere.  He is someone who does have chronic pain.  The knee replacement itself is doing well in terms of the revision but he has been dealing with significant low back pain and it does radiate to the right side in the sciatic region and down his leg.  He does have an MRI that has been performed of his lumbar spine.  I have provided short-term Valium for him and he said he felt like that made him angry but it did help.  We have talked about the possibility of chronic pain management as well.  He does not want any type of surgical intervention.  On exam his right revision knee is stable.  He does seem to have positive straight leg raise to the right side and there is a component of chronic pain.  There is weakness in his right leg.  The MRI does show moderate foraminal stenosis at L5-S1 to the right and severe to the left.  At this point I would like to send him to Dr. Alvester Morin for a right-sided epidural steroid injection at L5-S1.  I will then see him back myself in a month to see if this is helped.

## 2021-08-01 ENCOUNTER — Ambulatory Visit (INDEPENDENT_AMBULATORY_CARE_PROVIDER_SITE_OTHER): Payer: Medicare HMO | Admitting: Family Medicine

## 2021-08-01 ENCOUNTER — Encounter: Payer: Self-pay | Admitting: Family Medicine

## 2021-08-01 DIAGNOSIS — J111 Influenza due to unidentified influenza virus with other respiratory manifestations: Secondary | ICD-10-CM

## 2021-08-01 MED ORDER — OSELTAMIVIR PHOSPHATE 75 MG PO CAPS: 75.0000 mg | ORAL_CAPSULE | Freq: Two times a day (BID) | ORAL | 0 refills | Status: DC

## 2021-08-01 NOTE — Progress Notes (Signed)
Subjective:    Patient ID: Alex [name], male    DOB: [birth year], 61 y.o.   MRN: [id number]   HPI: Alex [name] is a 61 y.o. male presenting for aching and weakness with fever onset yesterday. Coughing a lot. No sputum. Denies sore throat, earache.   Depression screen [facility] 2/9 07/14/2021 07/13/2021 05/24/2021 05/24/2021 02/23/2021  Decreased Interest 1 1 1  0 0  Down, Depressed, Hopeless 1 1 1  0 0  PHQ - 2 Score 2 2 2  0 0  Altered sleeping 1 1 1  - 1  Tired, decreased energy 1 1 2  - 1  Change in appetite 1 1 0 - 0  Feeling bad or failure about yourself  1 1 0 - 1  Trouble concentrating 0 0 0 - 1  Moving slowly or fidgety/restless 0 0 0 - 0  Suicidal thoughts 0 0 0 - 0  PHQ-9 Score 6 6 5  - 4  Difficult doing work/chores Not difficult at all - - - -  Some recent data might be hidden     Relevant past medical, surgical, family and social history reviewed and updated as indicated.  Interim medical history since our last visit reviewed. Allergies and medications reviewed and updated.  ROS:  Review of Systems  Constitutional:  Positive for activity change, appetite change, chills and fever.  HENT:  Negative for congestion, ear discharge, ear pain, hearing loss, nosebleeds, postnasal drip, rhinorrhea, sinus pressure, sneezing and trouble swallowing.   Respiratory:  Positive for cough. Negative for chest tightness and shortness of breath.   Cardiovascular:  Negative for chest pain and palpitations.  Musculoskeletal:  Positive for myalgias.  Skin:  Negative for color change and rash.    Social History   Tobacco Use  Smoking Status Never  Smokeless Tobacco Current   Types: Chew       Objective:     Wt Readings from Last 3 Encounters:  07/13/21 225 lb (102.1 kg)  06/30/21 225 lb (102.1 kg)  05/24/21 229 lb (103.9 kg)     Exam deferred. Pt. Harboring due to COVID 19. Phone visit performed.   Assessment & Plan:   1. Influenza with respiratory manifestation     Meds  ordered this encounter  Medications   oseltamivir (TAMIFLU) 75 MG capsule    Sig: Take 1 capsule (75 mg total) by mouth 2 (two) times daily.    Dispense:  10 capsule    Refill:  0    No orders of the defined types were placed in this encounter.     Diagnoses and all orders for this visit:  Influenza with respiratory manifestation  Other orders -     oseltamivir (TAMIFLU) 75 MG capsule; Take 1 capsule (75 mg total) by mouth 2 (two) times daily.    Virtual Visit via telephone Note  I discussed the limitations, risks, security and privacy concerns of performing an evaluation and management service by telephone and the availability of in person appointments. The patient was identified with two identifiers. Pt.expressed understanding and agreed to proceed. Pt. Is at home. Dr. Livia Snellen is in his office.  Follow Up Instructions:   I discussed the assessment and treatment plan with the patient. The patient was provided an opportunity to ask questions and all were answered. The patient agreed with the plan and demonstrated an understanding of the instructions.   The patient was advised to call back or seek an in-person evaluation if the symptoms worsen or if the condition fails to  improve as anticipated.   Total minutes including chart review and phone contact time: 7   Follow up plan: Return if symptoms worsen or fail to improve.  Claretta Fraise, MD [place]

## 2021-08-08 DIAGNOSIS — E1169 Type 2 diabetes mellitus with other specified complication: Secondary | ICD-10-CM

## 2021-08-08 DIAGNOSIS — I152 Hypertension secondary to endocrine disorders: Secondary | ICD-10-CM

## 2021-08-08 DIAGNOSIS — E1159 Type 2 diabetes mellitus with other circulatory complications: Secondary | ICD-10-CM | POA: Diagnosis not present

## 2021-08-08 DIAGNOSIS — E669 Obesity, unspecified: Secondary | ICD-10-CM

## 2021-08-09 ENCOUNTER — Telehealth: Payer: Self-pay | Admitting: Physical Medicine and Rehabilitation

## 2021-08-09 NOTE — Telephone Encounter (Signed)
Patient called asked for a call back as soon as possible. Patient said he need a sooner appointment. The number to contact patient is [phone]

## 2021-08-09 NOTE — Progress Notes (Signed)
Received notification from [employer] regarding approval for OZEMPIC. Patient assistance approved from 08/08/21 to 06/07/22.  SHIPMENT PROCESSING  Phone: [phone]

## 2021-08-22 ENCOUNTER — Encounter: Payer: Self-pay | Admitting: Physical Medicine and Rehabilitation

## 2021-08-22 ENCOUNTER — Ambulatory Visit (INDEPENDENT_AMBULATORY_CARE_PROVIDER_SITE_OTHER): Payer: Medicare HMO | Admitting: Physical Medicine and Rehabilitation

## 2021-08-22 ENCOUNTER — Other Ambulatory Visit: Payer: Self-pay

## 2021-08-22 ENCOUNTER — Ambulatory Visit: Payer: Self-pay

## 2021-08-22 VITALS — BP 160/98 | HR 75

## 2021-08-22 DIAGNOSIS — R6889 Other general symptoms and signs: Secondary | ICD-10-CM | POA: Diagnosis not present

## 2021-08-22 DIAGNOSIS — M5416 Radiculopathy, lumbar region: Secondary | ICD-10-CM

## 2021-08-22 MED ORDER — METHYLPREDNISOLONE ACETATE 80 MG/ML IJ SUSP
80.0000 mg | Freq: Once | INTRAMUSCULAR | Status: AC
  Administered 2021-08-22: 80 mg

## 2021-08-22 NOTE — Progress Notes (Signed)
Alex [name] - 61 y.o. male MRN [id number]  Date of birth: 09/08/1960  Office Visit Note: Visit Date: 08/22/2021 PCP: Dettinger, Elige Radon, MD Referred by: Dettinger, Elige Radon, MD  Subjective: Chief Complaint  Patient presents with   Lower Back - Pain   Right Knee - Pain   HPI:  Alex [name] is a 61 y.o. male who comes in today at the request of Dr. Doneen Poisson for planned Right L5-S1 Lumbar Interlaminar epidural steroid injection with fluoroscopic guidance.  The patient has failed conservative care including home exercise, medications, time and activity modification.  This injection will be diagnostic and hopefully therapeutic.  Please see requesting physician notes for further details and justification.   ROS Otherwise per HPI.  Assessment & Plan: Visit Diagnoses:    ICD-10-CM   1. Lumbar radiculopathy  M54.16 XR C-ARM NO REPORT    Epidural Steroid injection    methylPREDNISolone acetate (DEPO-MEDROL) injection 80 mg      Plan: No additional findings.   Meds & Orders:  Meds ordered this encounter  Medications   methylPREDNISolone acetate (DEPO-MEDROL) injection 80 mg    Orders Placed This Encounter  Procedures   XR C-ARM NO REPORT   Epidural Steroid injection    Follow-up: Return for visit to requesting provider as needed.   Procedures: No procedures performed  Lumbar Epidural Steroid Injection - Interlaminar Approach with Fluoroscopic Guidance  Patient: Alex [name]      Date of Birth: 13-Feb-1961 MRN: [id number] PCP: Dettinger, Elige Radon, MD      Visit Date: 08/22/2021   Universal Protocol:     Consent Given By: the patient  Position: PRONE  Additional Comments: Vital signs were monitored before and after the procedure. Patient was prepped and draped in the usual sterile fashion. The correct patient, procedure, and site was verified.   Injection Procedure Details:   Procedure diagnoses: Lumbar radiculopathy [M54.16]   Meds Administered:   Meds ordered this encounter  Medications   methylPREDNISolone acetate (DEPO-MEDROL) injection 80 mg     Laterality: Right  Location/Site:  L5-S1  Needle: 3.5 in., 20 ga. Tuohy  Needle Placement: Paramedian epidural  Findings:   -Comments: Excellent flow of contrast into the epidural space.  Procedure Details: Using a paramedian approach from the side mentioned above, the region overlying the inferior lamina was localized under fluoroscopic visualization and the soft tissues overlying this structure were infiltrated with 4 ml. of 1% Lidocaine without Epinephrine. The Tuohy needle was inserted into the epidural space using a paramedian approach.   The epidural space was localized using loss of resistance along with counter oblique bi-planar fluoroscopic views.  After negative aspirate for air, blood, and CSF, a 2 ml. volume of Isovue-250 was injected into the epidural space and the flow of contrast was observed. Radiographs were obtained for documentation purposes.    The injectate was administered into the level noted above.   Additional Comments:  The patient tolerated the procedure well Dressing: 2 x 2 sterile gauze and Band-Aid    Post-procedure details: Patient was observed during the procedure. Post-procedure instructions were reviewed.  Patient left the clinic in stable condition.   Clinical History: MRI LUMBAR SPINE WITHOUT CONTRAST   TECHNIQUE: Multiplanar, multisequence MR imaging of the lumbar spine was performed. No intravenous contrast was administered.   COMPARISON:  Radiographs June 30, 2021.   FINDINGS: Segmentation:  Standard.   Alignment:  Trace anterolisthesis of L5 over S1.   Vertebrae: No acute fracture, evidence of  discitis, or bone lesion. Chronic superior endplate fracture with associated Schmorl node at T12 with approximately 50% height loss anteriorly. No retropulsion. Hemangiomas at L1, L3 and S2. Postsurgical changes from  left sacroiliac joint fixation.   Conus medullaris and cauda equina: Conus extends to the L1 level. Conus and cauda equina appear normal.   Paraspinal and other soft tissues: Negative.   Disc levels:   T12-L1: No spinal canal or neural foraminal stenosis.   L1-2: Loss of disc height, shallow disc bulge with superimposed central disc protrusion mild facet degenerative changes resulting in mild spinal canal stenosis. No significant neural foraminal narrowing.   L2-3: No spinal canal or neural foraminal stenosis.   L3-4: Shallow disc bulge and mild facet degenerative changes without significant spinal canal or neural foraminal stenosis.   L4-5: Hypertrophic facet degenerative changes with bilateral joint effusion with articular process and periarticular soft tissue edema, left greater than right, suggesting active osteoarthritis. No significant spinal canal or neural foraminal stenosis.   L5-S1: Disc bulge/disc uncovering. Prominent hypertrophic facet degenerative changes with right-sided joint effusion, articular process and periarticular soft tissue edema, suggesting active osteoarthritis. Moderate right and severe left neural foraminal narrowing.   IMPRESSION: 1. Degenerative changes of the lumbar spine, more pronounced at the level of the facet joints at L4-5 and L5-S1 where there is evidence of active osteoarthritis. 2. Moderate right and severe left neural foraminal narrowing at L5-S1.     Electronically Signed   By: Baldemar Lenis M.D.   On: 07/27/2021 11:21     Objective:  VS:  HT:     WT:    BMI:      BP:(!) 160/98   HR:75bpm   TEMP: ( )   RESP:  Physical Exam Vitals and nursing note reviewed.  Constitutional:      General: He is not in acute distress.    Appearance: Normal appearance. He is not ill-appearing.  HENT:     Head: Normocephalic and atraumatic.     Right Ear: External ear normal.     Left Ear: External ear normal.     Nose: No  congestion.  Eyes:     Extraocular Movements: Extraocular movements intact.  Cardiovascular:     Rate and Rhythm: Normal rate.     Pulses: Normal pulses.  Pulmonary:     Effort: Pulmonary effort is normal. No respiratory distress.  Abdominal:     General: There is no distension.     Palpations: Abdomen is soft.  Musculoskeletal:        General: No tenderness or signs of injury.     Cervical back: Neck supple.     Right lower leg: No edema.     Left lower leg: No edema.     Comments: Patient has good distal strength without clonus.  Skin:    Findings: No erythema or rash.  Neurological:     General: No focal deficit present.     Mental Status: He is alert and oriented to person, place, and time.     Sensory: No sensory deficit.     Motor: No weakness or abnormal muscle tone.     Coordination: Coordination normal.  Psychiatric:        Mood and Affect: Mood normal.        Behavior: Behavior normal.     Imaging: No results found.

## 2021-08-22 NOTE — Patient Instructions (Signed)

## 2021-08-22 NOTE — Progress Notes (Signed)
Pt state lower back pain that travels to his buttock and dow to his right knee. Pt state walking and standing makes the pain worse. Pt state he takes over the counter pain med to help ease his pain.  Numeric Pain Rating Scale and Functional Assessment Average Pain 8   In the last MONTH (on 0-10 scale) has pain interfered with the following?  1. General activity like being  able to carry out your everyday physical activities such as walking, climbing stairs, carrying groceries, or moving a chair?  Rating(10)   +Driver, -BT, -Dye Allergies.

## 2021-08-26 NOTE — Procedures (Signed)
Lumbar Epidural Steroid Injection - Interlaminar Approach with Fluoroscopic Guidance  Patient: Alex [name]      Date of Birth: 1960/07/22 MRN: [id number] PCP: Dettinger, Fransisca Kaufmann, MD      Visit Date: 08/22/2021   Universal Protocol:     Consent Given By: the patient  Position: PRONE  Additional Comments: Vital signs were monitored before and after the procedure. Patient was prepped and draped in the usual sterile fashion. The correct patient, procedure, and site was verified.   Injection Procedure Details:   Procedure diagnoses: Lumbar radiculopathy [M54.16]   Meds Administered:  Meds ordered this encounter  Medications   methylPREDNISolone acetate (DEPO-MEDROL) injection 80 mg     Laterality: Right  Location/Site:  L5-S1  Needle: 3.5 in., 20 ga. Tuohy  Needle Placement: Paramedian epidural  Findings:   -Comments: Excellent flow of contrast into the epidural space.  Procedure Details: Using a paramedian approach from the side mentioned above, the region overlying the inferior lamina was localized under fluoroscopic visualization and the soft tissues overlying this structure were infiltrated with 4 ml. of 1% Lidocaine without Epinephrine. The Tuohy needle was inserted into the epidural space using a paramedian approach.   The epidural space was localized using loss of resistance along with counter oblique bi-planar fluoroscopic views.  After negative aspirate for air, blood, and CSF, a 2 ml. volume of Isovue-250 was injected into the epidural space and the flow of contrast was observed. Radiographs were obtained for documentation purposes.    The injectate was administered into the level noted above.   Additional Comments:  The patient tolerated the procedure well Dressing: 2 x 2 sterile gauze and Band-Aid    Post-procedure details: Patient was observed during the procedure. Post-procedure instructions were reviewed.  Patient left the clinic in stable  condition.

## 2021-08-28 ENCOUNTER — Ambulatory Visit: Payer: Medicare HMO | Admitting: Orthopaedic Surgery

## 2021-08-30 ENCOUNTER — Ambulatory Visit: Payer: Medicare HMO | Admitting: Family Medicine

## 2021-08-30 ENCOUNTER — Telehealth: Payer: Self-pay | Admitting: Family Medicine

## 2021-08-30 NOTE — Telephone Encounter (Signed)
Advised pt there should be a number to call the company on the box and to call them and they should ship a replacement to him and may need the one he has shipped back. Pt voiced understanding.

## 2021-08-30 NOTE — Telephone Encounter (Signed)
Pt called stating that his pen broke to his insulin. Wants to know if he should bring it to the office today so that we can see what it looks like?

## 2021-08-31 ENCOUNTER — Telehealth: Payer: Medicare HMO

## 2021-09-01 ENCOUNTER — Ambulatory Visit (INDEPENDENT_AMBULATORY_CARE_PROVIDER_SITE_OTHER): Payer: Medicare HMO | Admitting: Family Medicine

## 2021-09-01 ENCOUNTER — Telehealth: Payer: Self-pay | Admitting: Family Medicine

## 2021-09-01 ENCOUNTER — Other Ambulatory Visit: Payer: Self-pay | Admitting: Family Medicine

## 2021-09-01 ENCOUNTER — Encounter: Payer: Self-pay | Admitting: Family Medicine

## 2021-09-01 VITALS — BP 135/85 | HR 105 | Ht 66.0 in | Wt 218.0 lb

## 2021-09-01 DIAGNOSIS — I152 Hypertension secondary to endocrine disorders: Secondary | ICD-10-CM

## 2021-09-01 DIAGNOSIS — E1169 Type 2 diabetes mellitus with other specified complication: Secondary | ICD-10-CM | POA: Diagnosis not present

## 2021-09-01 DIAGNOSIS — F32A Depression, unspecified: Secondary | ICD-10-CM | POA: Diagnosis not present

## 2021-09-01 DIAGNOSIS — E785 Hyperlipidemia, unspecified: Secondary | ICD-10-CM | POA: Diagnosis not present

## 2021-09-01 DIAGNOSIS — E669 Obesity, unspecified: Secondary | ICD-10-CM

## 2021-09-01 DIAGNOSIS — F419 Anxiety disorder, unspecified: Secondary | ICD-10-CM | POA: Diagnosis not present

## 2021-09-01 DIAGNOSIS — E1159 Type 2 diabetes mellitus with other circulatory complications: Secondary | ICD-10-CM | POA: Diagnosis not present

## 2021-09-01 DIAGNOSIS — L219 Seborrheic dermatitis, unspecified: Secondary | ICD-10-CM

## 2021-09-01 DIAGNOSIS — Z202 Contact with and (suspected) exposure to infections with a predominantly sexual mode of transmission: Secondary | ICD-10-CM

## 2021-09-01 LAB — BAYER DCA HB A1C WAIVED: HB A1C (BAYER DCA - WAIVED): 6.1 % — ABNORMAL HIGH (ref 4.8–5.6)

## 2021-09-01 MED ORDER — SERTRALINE HCL 50 MG PO TABS: 50.0000 mg | ORAL_TABLET | Freq: Every day | ORAL | 3 refills | Status: DC

## 2021-09-01 MED ORDER — PANTOPRAZOLE SODIUM 40 MG PO TBEC: 40.0000 mg | DELAYED_RELEASE_TABLET | Freq: Two times a day (BID) | ORAL | 3 refills | Status: DC

## 2021-09-01 MED ORDER — LISINOPRIL 40 MG PO TABS: 40.0000 mg | ORAL_TABLET | Freq: Every day | ORAL | 3 refills | Status: DC

## 2021-09-01 MED ORDER — KETOCONAZOLE 2 % EX SHAM: MEDICATED_SHAMPOO | CUTANEOUS | 1 refills | Status: DC

## 2021-09-01 NOTE — Telephone Encounter (Signed)
Dettinger,  What test do you want?

## 2021-09-01 NOTE — Telephone Encounter (Signed)
done

## 2021-09-01 NOTE — Progress Notes (Signed)
BP 135/85    Pulse (!) 105    Ht 5\' 6"  (1.676 m)    Wt 218 lb (98.9 kg)    SpO2 99%    BMI 35.19 kg/m    Subjective:   Patient ID: Alex [name], male    DOB: 1960/07/22, 61 y.o.   MRN: [id number]  HPI: Alex [name] is a 61 y.o. male presenting on 09/01/2021 for Medical Management of Chronic Issues, Diabetes, and Agitation (Family concerns with wife)   HPI Type 2 diabetes mellitus Patient comes in today for recheck of his diabetes. Patient has been currently taking Ozempic, A1c looks good at 6.1. Patient is currently on an ACE inhibitor/ARB. Patient has not seen an ophthalmologist this year. Patient denies any issues with their feet. The symptom started onset as an adult hypertension and hyperlipidemia ARE RELATED TO DM   Hypertension Patient is currently on amlodipine and lisinopril, and their blood pressure today is 135/85. Patient denies any lightheadedness or dizziness. Patient denies headaches, blurred vision, chest pains, shortness of breath, or weakness. Denies any side effects from medication and is content with current medication.   Hyperlipidemia Patient is coming in for recheck of his hyperlipidemia. The patient is currently taking no medication currently because of intolerance. They deny any issues with myalgias or history of liver damage from it. They deny any focal numbness or weakness or chest pain.   Anxiety and depression Patient is coming in for anxiety and depression.  He says he is been doing well since he found out that his wife has been cheating on him.  He has been struggling with irritability and anger and being upset.  He is talking to his pastor and a friend which is helpful with counseling. Depression screen [facility] 2/9 09/01/2021 07/14/2021 07/13/2021 05/24/2021 05/24/2021  Decreased Interest 1 1 1 1  0  Down, Depressed, Hopeless 1 1 1 1  0  PHQ - 2 Score 2 2 2 2  0  Altered sleeping 1 1 1 1  -  Tired, decreased energy 1 1 1 2  -  Change in appetite 1 1 1  0 -  Feeling  bad or failure about yourself  1 1 1  0 -  Trouble concentrating 0 0 0 0 -  Moving slowly or fidgety/restless 0 0 0 0 -  Suicidal thoughts 0 0 0 0 -  PHQ-9 Score 6 6 6 5  -  Difficult doing work/chores - Not difficult at all - - -  Some recent data might be hidden      Relevant past medical, surgical, family and social history reviewed and updated as indicated. Interim medical history since our last visit reviewed. Allergies and medications reviewed and updated.  Review of Systems  Constitutional:  Negative for chills and fever.  Eyes:  Negative for visual disturbance.  Respiratory:  Negative for shortness of breath and wheezing.   Cardiovascular:  Negative for chest pain and leg swelling.  Musculoskeletal:  Negative for back pain and gait problem.  Skin:  Negative for rash.  Neurological:  Negative for dizziness, weakness and light-headedness.  All other systems reviewed and are negative.  Per HPI unless specifically indicated above   Allergies as of 09/01/2021       Reactions   Morphine And Related Nausea Only   Buprenorphine Hcl-naloxone Hcl Nausea Only   Codeine Nausea Only, Other (See Comments)   per pt, "felt weird in my head"        Medication List  Accurate as of September 01, 2021  9:29 AM. If you have any questions, ask your nurse or doctor.          STOP taking these medications    oseltamivir 75 MG capsule Commonly known as: Tamiflu Stopped by: Fransisca Kaufmann Joah Patlan, MD       TAKE these medications    amLODipine 5 MG tablet Commonly known as: NORVASC Take 2 tablets (10 mg total) by mouth daily.   cyclobenzaprine 10 MG tablet Commonly known as: FLEXERIL Take 1 tablet (10 mg total) by mouth 3 (three) times daily as needed for muscle spasms.   diazepam 5 MG tablet Commonly known as: VALIUM Take 1 tablet (5 mg total) by mouth every 12 (twelve) hours as needed for anxiety.   diclofenac 75 MG EC tablet Commonly known as: VOLTAREN Take 1  tablet (75 mg total) by mouth 2 (two) times daily.   diclofenac Sodium 1 % Gel Commonly known as: Voltaren Apply 2 g topically 4 (four) times daily. What changed:  when to take this reasons to take this   dicyclomine 10 MG capsule Commonly known as: Bentyl Take 1 capsule (10 mg total) by mouth 4 (four) times daily -  before meals and at bedtime.   ketoconazole 2 % shampoo Commonly known as: NIZORAL SHAMPOO, LATHER, LEAVE FOR 10 MINUTES, THEN RINSE. USE 2 TIMES A WEEK FOR UP TO 4 WEEKS   lisinopril 40 MG tablet Commonly known as: ZESTRIL Take 1 tablet (40 mg total) by mouth daily.   nabumetone 750 MG tablet Commonly known as: RELAFEN Take 1 tablet (750 mg total) by mouth 2 (two) times daily as needed.   oxymetazoline 0.05 % nasal spray Commonly known as: AFRIN Place 1 spray into both nostrils every 6 (six) hours as needed for congestion.   pantoprazole 40 MG tablet Commonly known as: PROTONIX Take 1 tablet (40 mg total) by mouth 2 (two) times daily before a meal.   Semaglutide (1 MG/DOSE) 4 MG/3ML Sopn Inject 1 mg as directed once a week.   sertraline 50 MG tablet Commonly known as: Zoloft Take 1 tablet (50 mg total) by mouth daily. Started by: Worthy Rancher, MD   tiZANidine 4 MG tablet Commonly known as: Zanaflex Take 1 tablet (4 mg total) by mouth every 8 (eight) hours as needed for muscle spasms.         Objective:   BP 135/85    Pulse (!) 105    Ht 5\' 6"  (1.676 m)    Wt 218 lb (98.9 kg)    SpO2 99%    BMI 35.19 kg/m   Wt Readings from Last 3 Encounters:  09/01/21 218 lb (98.9 kg)  07/13/21 225 lb (102.1 kg)  06/30/21 225 lb (102.1 kg)    Physical Exam Vitals and nursing note reviewed.  Constitutional:      General: He is not in acute distress.    Appearance: He is well-developed. He is not diaphoretic.  Eyes:     General: No scleral icterus.    Conjunctiva/sclera: Conjunctivae normal.  Neck:     Thyroid: No thyromegaly.  Cardiovascular:      Rate and Rhythm: Normal rate and regular rhythm.     Heart sounds: Normal heart sounds. No murmur heard. Pulmonary:     Effort: Pulmonary effort is normal. No respiratory distress.     Breath sounds: Normal breath sounds. No wheezing.  Musculoskeletal:        General: Normal range of motion.  Cervical back: Neck supple.  Lymphadenopathy:     Cervical: No cervical adenopathy.  Skin:    General: Skin is warm and dry.     Findings: No rash.  Neurological:     Mental Status: He is alert and oriented to person, place, and time.     Coordination: Coordination normal.  Psychiatric:        Behavior: Behavior normal.      Assessment & Plan:   Problem List Items Addressed This Visit       Cardiovascular and Mediastinum   Hypertension associated with diabetes ([place])   Relevant Medications   lisinopril (ZESTRIL) 40 MG tablet     Endocrine   Diabetes mellitus type 2 in obese ([facility]CC) - Primary   Relevant Medications   lisinopril (ZESTRIL) 40 MG tablet   Other Relevant Orders   Bayer DCA Hb A1c Waived   Hyperlipidemia associated with type 2 diabetes mellitus ([facility])   Relevant Medications   lisinopril (ZESTRIL) 40 MG tablet   Other Visit Diagnoses     Anxiety and depression       Relevant Medications   sertraline (ZOLOFT) 50 MG tablet     A1c looks good at 6.1.  No changes.  Blood pressure looks good.  Will start Zoloft to help with his anxiety and irritability. Follow up plan: Return in about 3 months (around 11/29/2021), or if symptoms worsen or fail to improve, for Anxiety depression and hypertension.  Counseling provided for all of the vaccine components Orders Placed This Encounter  Procedures   Bayer [place] Hb A1c Ulen Kristoffer Bala, MD [facility] 09/01/2021, 9:29 AM

## 2021-09-04 NOTE — Telephone Encounter (Signed)
Placed orders for STD testing, he will need to do blood work and urine

## 2021-09-04 NOTE — Telephone Encounter (Signed)
Pt aware orders have been placed and will come in later today.

## 2021-09-05 ENCOUNTER — Other Ambulatory Visit: Payer: Self-pay

## 2021-09-05 ENCOUNTER — Telehealth: Payer: Self-pay | Admitting: Family Medicine

## 2021-09-05 ENCOUNTER — Other Ambulatory Visit: Payer: Medicare HMO

## 2021-09-05 DIAGNOSIS — Z202 Contact with and (suspected) exposure to infections with a predominantly sexual mode of transmission: Secondary | ICD-10-CM

## 2021-09-05 DIAGNOSIS — Z113 Encounter for screening for infections with a predominantly sexual mode of transmission: Secondary | ICD-10-CM | POA: Diagnosis not present

## 2021-09-05 NOTE — Telephone Encounter (Signed)
Please review and advise.

## 2021-09-05 NOTE — Telephone Encounter (Signed)
Patient wants to switch from Afrin to Flonase. Please send to [facility] and call patient when sent.

## 2021-09-06 ENCOUNTER — Other Ambulatory Visit: Payer: Self-pay

## 2021-09-06 ENCOUNTER — Encounter: Payer: Self-pay | Admitting: Orthopaedic Surgery

## 2021-09-06 ENCOUNTER — Ambulatory Visit (INDEPENDENT_AMBULATORY_CARE_PROVIDER_SITE_OTHER): Payer: Medicare HMO | Admitting: Orthopaedic Surgery

## 2021-09-06 DIAGNOSIS — M5441 Lumbago with sciatica, right side: Secondary | ICD-10-CM | POA: Diagnosis not present

## 2021-09-06 DIAGNOSIS — R6889 Other general symptoms and signs: Secondary | ICD-10-CM | POA: Diagnosis not present

## 2021-09-06 DIAGNOSIS — Z96651 Presence of right artificial knee joint: Secondary | ICD-10-CM

## 2021-09-06 DIAGNOSIS — G8929 Other chronic pain: Secondary | ICD-10-CM

## 2021-09-06 LAB — HEPB+HEPC+HIV PANEL
HIV Screen 4th Generation wRfx: NONREACTIVE
Hep B C IgM: NEGATIVE
Hep B Core Total Ab: NEGATIVE
Hep B E Ab: NEGATIVE
Hep B E Ag: NEGATIVE
Hep B Surface Ab, Qual: NONREACTIVE
Hep C Virus Ab: NONREACTIVE
Hepatitis B Surface Ag: NEGATIVE

## 2021-09-06 LAB — HSV(HERPES SIMPLEX VRS) I + II AB-IGG
HSV 1 Glycoprotein G Ab, IgG: 58.6 index — ABNORMAL HIGH (ref 0.00–0.90)
HSV 2 IgG, Type Spec: <0.91 index (ref 0.00–0.90)

## 2021-09-06 LAB — RPR: RPR Ser Ql: NONREACTIVE

## 2021-09-06 MED ORDER — DIAZEPAM 5 MG PO TABS: 5.0000 mg | ORAL_TABLET | Freq: Every day | ORAL | 0 refills | Status: AC | PRN

## 2021-09-06 MED ORDER — FLUTICASONE PROPIONATE 50 MCG/ACT NA SUSP: 1.0000 | Freq: Two times a day (BID) | NASAL | 6 refills | Status: DC | PRN

## 2021-09-06 MED ORDER — HYDROCODONE-ACETAMINOPHEN 7.5-325 MG PO TABS: 1.0000 | ORAL_TABLET | Freq: Two times a day (BID) | ORAL | 0 refills | Status: DC | PRN

## 2021-09-06 NOTE — Progress Notes (Signed)
The patient comes in today having had a successful right-sided epidural steroid injection by Dr. Alvester Morin at L5-S1.  He is still having occasional spasms and radicular symptoms going down his right leg and some on his right leg in the hamstring area.  I did talk to him about the possibility of outpatient physical therapy and he is agreeable to trying this.  This can be set up at [facility] outpatient therapy in [facility].  Any modalities per the therapist discretion that can help decrease the patient's low back pain and right-sided sciatica as well as potential left-sided sciatica. ? ?On exam he has a negative straight leg raise on the right side.  He has a previous right knee revision arthroplasty.  It feels ligamentously stable.  He is able to get up from a chair more easily and have seen him before. ? ?I will send in some Valium to take sparingly on occasion for those times when his spasms get significant.  I will send an occasional hydrocodone to take sparingly.  We will set up outpatient physical therapy and I would like to see him back in 4 weeks to see how this is done for him.  He agrees with this treatment plan. ?

## 2021-09-06 NOTE — Telephone Encounter (Signed)
Sent Flonase to the pharmacy for the patient. ?

## 2021-09-07 ENCOUNTER — Telehealth: Payer: Self-pay | Admitting: Family Medicine

## 2021-09-07 ENCOUNTER — Other Ambulatory Visit: Payer: Self-pay | Admitting: Family Medicine

## 2021-09-07 NOTE — Telephone Encounter (Signed)
I do not see medication on providers list please review ?

## 2021-09-07 NOTE — Telephone Encounter (Signed)
Tried calling pt. No answer and voicemail not set up 

## 2021-09-08 LAB — CT, NG, MYCOPLASMAS NAA, URINE
Chlamydia trachomatis, NAA: NEGATIVE
Mycoplasma genitalium NAA: NEGATIVE
Mycoplasma hominis NAA: NEGATIVE
Neisseria gonorrhoeae, NAA: NEGATIVE
Ureaplasma spp NAA: NEGATIVE

## 2021-09-18 ENCOUNTER — Ambulatory Visit: Payer: Medicare HMO | Attending: Orthopaedic Surgery | Admitting: Physical Therapy

## 2021-09-18 ENCOUNTER — Encounter: Payer: Self-pay | Admitting: Physical Therapy

## 2021-09-18 ENCOUNTER — Other Ambulatory Visit: Payer: Self-pay

## 2021-09-18 DIAGNOSIS — G8929 Other chronic pain: Secondary | ICD-10-CM | POA: Insufficient documentation

## 2021-09-18 DIAGNOSIS — M5441 Lumbago with sciatica, right side: Secondary | ICD-10-CM | POA: Insufficient documentation

## 2021-09-18 DIAGNOSIS — M25561 Pain in right knee: Secondary | ICD-10-CM | POA: Insufficient documentation

## 2021-09-18 NOTE — Therapy (Signed)
[place] [facility] ?[address] ?[place], [place], [address] ?Phone: [phone]   Fax:  [phone] ? ?Physical Therapy Evaluation ? ?Patient Details  ?Name: Alex [name] ?MRN: [id number] ?Date of Birth: Dec 24, 1960 ?Referring Provider (PT): Allie Bossier MD ? ? ?Encounter Date: 09/18/2021 ? ? PT End of Session - 09/18/21 1000   ? ? Visit Number 1   ? Number of Visits 12   ? Date for PT Re-Evaluation 10/30/21   ? PT Start Time [phone]   ? PT Stop Time 0945   ? PT Time Calculation (min) 33 min   ? Activity Tolerance Patient tolerated treatment well   ? Behavior During Therapy [facility] for tasks assessed/performed   ? ?  ?  ? ?  ? ? ?Past Medical History:  ?Diagnosis Date  ? Diabetes mellitus without complication (HCC)   ? History of kidney stones   ? removed 5 months ago  ? Hypertension   ? PONV (postoperative nausea and vomiting)   ? AGITATION after anesthesia as well  ? Sleep apnea   ? NO CPAP  ? ? ?Past Surgical History:  ?Procedure Laterality Date  ? ANTERIOR CERVICAL DECOMP/DISCECTOMY FUSION N/A 03/27/2018  ? Procedure: ANTERIOR CERVICAL DECOMPRESSION/DISCECTOMY FUSION C3-5;  Surgeon: Venita Lick, MD;  Location: [facility] OR;  Service: Orthopedics;  Laterality: N/A;  3.5 hrs  ? arm surgery Right   ? x2 involving tendons/ulnar nerve  ? BACK SURGERY    ? BIOPSY  10/17/2020  ? Procedure: BIOPSY;  Surgeon: Lanelle Bal, DO;  [facility];  Service: Endoscopy;;  ? CARPAL TUNNEL RELEASE Right   ? clavical surgery    ? ESOPHAGOGASTRODUODENOSCOPY (EGD) WITH PROPOFOL N/A 10/17/2020  ? Procedure: ESOPHAGOGASTRODUODENOSCOPY (EGD) WITH PROPOFOL;  Surgeon: Lanelle Bal, DO;  [facility];  Service: Endoscopy;  Laterality: N/A;  PM  ? EYE SURGERY Bilateral   ? glaucoma  ? HERNIA REPAIR    ? JOINT REPLACEMENT Right 06/2011  ? TOTAL KNEE REVISION Right 04/10/2019  ? Procedure: RIGHT TOTAL KNEE REVISION;  Surgeon: Kathryne Hitch, MD;  Location: [facility] ORS;  Service:  Orthopedics;  Laterality: Right;  ? TRICEPS TENDON REPAIR    ? ? ?There were no vitals filed for this visit. ? ? ? Subjective Assessment - 09/18/21 1001   ? ? Subjective The patient presents to the clinic with c/o right buttock and posterior thigh pain to the front of his lower leg.  He had an epidural injection on 08/22/21 at level L5-S1 that was helpful.  His pian-level is a 6/10 today.  Certain movements and positions can increase his pain.  Rest and medication can decrease his pain some.   ? Pertinent History Right TKA and subsquent revision, DM, ACDF, hernia repair, right arm surgery, right CTR.   ? Diagnostic tests MRI:  Degenerative changes of the lumbar spine, more pronounced at the  level of the facet joints at L4-5 and L5-S1 where there is evidence  of active osteoarthritis.  2. Moderate right and severe left neural foraminal narrowing at  L5-S1.   ? Patient Stated Goals Get out of pain.   ? Currently in Pain? Yes   ? Pain Score 6    ? Pain Location Back   ? Pain Orientation Right   ? Pain Radiating Towards Pain radiation into right posterior/lateral thigh to region of anterior tib.   ? Pain Onset More than a month ago   ? Pain Frequency Constant   ?  Aggravating Factors  See above.   ? Pain Relieving Factors See above.   ? ?  ?  ? ?  ? ? ? ? ? [facility] PT Assessment - 09/18/21 0001   ? ?  ? Assessment  ? Medical Diagnosis Chronic right-sided LBP with sciatica.   ? Referring Provider (PT) Allie Bossierhris Blackman MD   ? Onset Date/Surgical Date --   ~a month ago.  ?  ? Precautions  ? Precautions None   ?  ? Restrictions  ? Weight Bearing Restrictions No   ?  ? Home Environment  ? Living Environment Private residence   ?  ? Prior Function  ? Level of Independence Independent   ?  ? Posture/Postural Control  ? Posture/Postural Control Postural limitations   ? Postural Limitations Rounded Shoulders;Forward head   ?  ? ROM / Strength  ? AROM / PROM / Strength AROM;Strength   ?  ? AROM  ? Overall AROM Comments Active lumbar  flexion limited by 25% and extension to 20 degrees.   ?  ? Strength  ? Overall Strength Comments Right hip abduction and flexion is 4 to 4+/5, right knee and ankle is normal.   ?  ? Palpation  ? Palpation comment Tender to palption in region of SIJ.  He also has c/o pain along his right ITB, posterior thigh and region of anterior tib.   ?  ? Special Tests  ? Other special tests Equal leg lengths.  (-) SLR on right.   ?  ? Ambulation/Gait  ? Gait Comments WNL.   ? ?  ?  ? ?  ? ? ? ? ? ? ? ? ? ? ? ? ? ?Objective measurements completed on examination: See above findings.  ? ? ? ? ? ? ? ? ? ? ? ? ? ? ? ? ? ? ? PT Long Term Goals - 09/18/21 1026   ? ?  ? PT LONG TERM GOAL #1  ? Title Independent with a HEP.   ? Time 6   ? Period Weeks   ? Status New   ?  ? PT LONG TERM GOAL #2  ? Title Eliminate right LE symptoms.   ? Time 6   ? Period Weeks   ? Status New   ?  ? PT LONG TERM GOAL #3  ? Title Perform ADL's with pain not > 3/10.   ? Time 6   ? Period Weeks   ? Status New   ? ?  ?  ? ?  ? ? ? ? ? ? ? ? ? Plan - 09/18/21 1019   ? ? Clinical Impression Statement The patient presents to OPPT with a diagnosis of chronic right-sided low back pain with right-sided sciatica.  A recent epidural on 08/22/21 was helpful.  He does, however, continue to c/o pain in the right buttock region radiating into his posterior/lateral thigh and region of anterior tib.  He had some palpable pain in the region of his right SIJ.  He demonstrated a negative right SLR test.  Patient will benefit from skilled physical therapy intervention to address pain and deficits.   ? Personal Factors and Comorbidities Comorbidity 1;Comorbidity 2;Other   ? Comorbidities Right TKA and subsquent revision, DM, ACDF, hernia repair, right arm surgery, right CTR.   ? Examination-Activity Limitations Other;Locomotion Level   ? Examination-Participation Restrictions Other   ? Stability/Clinical Decision Making Stable/Uncomplicated   ? Clinical Decision Making Low   ?  Rehab  Potential Good   ? PT Frequency 2x / week   ? PT Duration 6 weeks   ? PT Treatment/Interventions ADLs/Self Care Home Management;Cryotherapy;Electrical Stimulation;Ultrasound;Traction;Moist Heat;Therapeutic activities;Therapeutic exercise;Manual techniques;Patient/family education;Passive range of motion;Dry needling   ? PT Next Visit Plan Core exercise progression, modalities and STW/M as needed.   ? Consulted and Agree with Plan of Care Patient   ? ?  ?  ? ?  ? ? ?Patient will benefit from skilled therapeutic intervention in order to improve the following deficits and impairments:  Decreased activity tolerance, Decreased range of motion, Decreased strength, Pain ? ?Visit Diagnosis: ?Chronic right-sided low back pain with right-sided sciatica - Plan: PT plan of care cert/re-cert ? ? ? ? ?Problem List ?Patient Active Problem List  ? Diagnosis Date Noted  ? Hyperlipidemia associated with type 2 diabetes mellitus (HCC) 06/23/2020  ? Status post revision of total replacement of right knee 04/10/2019  ? Loose right total knee arthroplasty (HCC) 03/09/2019  ? Neuropathic pain 01/01/2019  ? Cervical spine degeneration 12/30/2018  ? Acquired trigger finger of left middle finger 08/22/2018  ? S/P cervical spinal fusion 03/27/2018  ? Morbid obesity (HCC) 02/21/2018  ? Osteoarthritis of right glenohumeral joint 01/21/2018  ? Tear of right rotator cuff 12/17/2017  ? Recurrent nephrolithiasis 09/18/2017  ? Drug dependence (HCC) 08/19/2017  ? Diabetes mellitus type 2 in obese (HCC) 03/28/2017  ? Hypertension associated with diabetes (HCC) 08/25/2016  ? ? ?Christopherjohn Schiele, [place], PT ?09/18/2021, 10:28 AM ? ?[place] ?[facility] ?[address] ?[place], [place], [address] ?Phone: [phone]   Fax:  [phone] ? ?Name: Aldrick Derrig ?MRN: [id number] ?Date of Birth: 1960-09-18 ? ? ?

## 2021-09-21 ENCOUNTER — Encounter: Payer: Self-pay | Admitting: Physical Therapy

## 2021-09-21 ENCOUNTER — Other Ambulatory Visit: Payer: Self-pay

## 2021-09-21 ENCOUNTER — Ambulatory Visit: Payer: Medicare HMO | Admitting: Physical Therapy

## 2021-09-21 DIAGNOSIS — G8929 Other chronic pain: Secondary | ICD-10-CM | POA: Diagnosis not present

## 2021-09-21 DIAGNOSIS — M25561 Pain in right knee: Secondary | ICD-10-CM | POA: Diagnosis not present

## 2021-09-21 DIAGNOSIS — M5441 Lumbago with sciatica, right side: Secondary | ICD-10-CM | POA: Diagnosis not present

## 2021-09-21 NOTE — Therapy (Signed)
[place] ?[facility] ?[address] ?[place], [place], [address] ?Phone: [phone]   Fax:  [phone] ? ?Physical Therapy Treatment ? ?Patient Details  ?Name: Alex [name] ?MRN: [id number] ?Date of Birth: Aug 27, 1960 ?Referring Provider (PT): Allie Bossier MD ? ? ?Encounter Date: 09/21/2021 ? ? PT End of Session - 09/21/21 0925   ? ? Visit Number 2   ? Number of Visits 12   ? Date for PT Re-Evaluation 10/30/21   ? PT Start Time [phone]   ? PT Stop Time [phone]   ? PT Time Calculation (min) 42 min   ? Activity Tolerance Patient tolerated treatment well   ? Behavior During Therapy [facility] for tasks assessed/performed   ? ?  ?  ? ?  ? ? ?Past Medical History:  ?Diagnosis Date  ? Diabetes mellitus without complication (HCC)   ? History of kidney stones   ? removed 5 months ago  ? Hypertension   ? PONV (postoperative nausea and vomiting)   ? AGITATION after anesthesia as well  ? Sleep apnea   ? NO CPAP  ? ? ?Past Surgical History:  ?Procedure Laterality Date  ? ANTERIOR CERVICAL DECOMP/DISCECTOMY FUSION N/A 03/27/2018  ? Procedure: ANTERIOR CERVICAL DECOMPRESSION/DISCECTOMY FUSION C3-5;  Surgeon: Venita Lick, MD;  Location: [facility] OR;  Service: Orthopedics;  Laterality: N/A;  3.5 hrs  ? arm surgery Right   ? x2 involving tendons/ulnar nerve  ? BACK SURGERY    ? BIOPSY  10/17/2020  ? Procedure: BIOPSY;  Surgeon: Lanelle Bal, DO;  [facility];  Service: Endoscopy;;  ? CARPAL TUNNEL RELEASE Right   ? clavical surgery    ? ESOPHAGOGASTRODUODENOSCOPY (EGD) WITH PROPOFOL N/A 10/17/2020  ? Procedure: ESOPHAGOGASTRODUODENOSCOPY (EGD) WITH PROPOFOL;  Surgeon: Lanelle Bal, DO;  [facility];  Service: Endoscopy;  Laterality: N/A;  PM  ? EYE SURGERY Bilateral   ? glaucoma  ? HERNIA REPAIR    ? JOINT REPLACEMENT Right 06/2011  ? TOTAL KNEE REVISION Right 04/10/2019  ? Procedure: RIGHT TOTAL KNEE REVISION;  Surgeon: Kathryne Hitch, MD;  Location: [facility] ORS;  Service:  Orthopedics;  Laterality: Right;  ? TRICEPS TENDON REPAIR    ? ? ?There were no vitals filed for this visit. ? ? Subjective Assessment - 09/21/21 0923   ? ? Subjective Reports pain going down into groin, adductors, abductors to shin of RLE. Reports that R knee hurting as well. Took half of a 7.5 mg hydrocodone about an hour before PT for pain but doesn't like taking them. Reports good relief from epidural shot.   ? Pertinent History Right TKA and subsquent revision, DM, ACDF, hernia repair, right arm surgery, right CTR.   ? Diagnostic tests MRI:  Degenerative changes of the lumbar spine, more pronounced at the  level of the facet joints at L4-5 and L5-S1 where there is evidence  of active osteoarthritis.  2. Moderate right and severe left neural foraminal narrowing at  L5-S1.   ? Patient Stated Goals Get out of pain.   ? Currently in Pain? Yes   ? Pain Score 8    ? Pain Location Back   ? Pain Orientation Right   ? Pain Descriptors / Indicators Discomfort   ? Pain Type Chronic pain   ? Pain Radiating Towards RLE to shin   ? Pain Onset More than a month ago   ? Pain Frequency Constant   ? ?  ?  ? ?  ? ? ? ? ?  [facility] PT Assessment - 09/21/21 0001   ? ?  ? Assessment  ? Medical Diagnosis Chronic right-sided LBP with sciatica.   ? Referring Provider (PT) Allie Bossierhris Blackman MD   ? Next MD Visit 10/04/2021   ?  ? Precautions  ? Precautions None   ?  ? Restrictions  ? Weight Bearing Restrictions No   ? ?  ?  ? ?  ? ? ? ? ? ? ? ? ? ? ? ? ? ? ? ? [facility] Adult PT Treatment/Exercise - 09/21/21 0001   ? ?  ? Exercises  ? Exercises Lumbar   ?  ? Lumbar Exercises: Stretches  ? Active Hamstring Stretch Right;3 reps;60 seconds   ? Figure 4 Stretch 3 reps;60 seconds;Seated;Without overpressure   ?  ? Lumbar Exercises: Aerobic  ? Nustep L3 x17 min   ?  ? Lumbar Exercises: Standing  ? Other Standing Lumbar Exercises Rockerboard x5 min   ?  ? Modalities  ? Modalities Electrical Stimulation   ?  ? Electrical Stimulation  ? Electrical Stimulation  Location R low back/ glute   ? Electrical Stimulation Action Pre-Mod   ? Electrical Stimulation Parameters 80-150 hz x10 min   ? Electrical Stimulation Goals Pain;Tone   ? ?  ?  ? ?  ? ? ? ? ? ? ? ? ? ? PT Education - 09/21/21 0955   ? ? Education Details [id number] T   ? Person(s) Educated Patient   ? Methods Explanation;Demonstration;Handout   ? Comprehension Verbalized understanding   ? ?  ?  ? ?  ? ? ? ? ? ? PT Long Term Goals - 09/18/21 1026   ? ?  ? PT LONG TERM GOAL #1  ? Title Independent with a HEP.   ? Time 6   ? Period Weeks   ? Status New   ?  ? PT LONG TERM GOAL #2  ? Title Eliminate right LE symptoms.   ? Time 6   ? Period Weeks   ? Status New   ?  ? PT LONG TERM GOAL #3  ? Title Perform ADL's with pain not > 3/10.   ? Time 6   ? Period Weeks   ? Status New   ? ?  ?  ? ?  ? ? ? ? ? ? ? ? Plan - 09/21/21 1042   ? ? Clinical Impression Statement Patient presented in clinic with reports of RLE pain and radicular symptoms. Patient progressed through stretching due to muscle tightness and pain control. Patient reported R figure 4 stretch is more dificult due to pain in R hip and he has difficulty in donning shoes and socks. Patient has TENS unit at home and was education regarding electrode placement and parameters to avoid muscle contraction. Patient provided new HEP for stretching to be continued at home as patient states that he does HEP until he feels good and then stops. Patient was encouraged to at least continue some daily even if symptoms are gone or more managable. Normal stimulation response and patient requested PTA to mark using a pen of electrode placement so that someone at home would see where to don electrodes. Patient also advised that hydrocodone prior to driving was worrisome due to effects of medication.   ? Personal Factors and Comorbidities Comorbidity 1;Comorbidity 2;Other   ? Comorbidities Right TKA and subsquent revision, DM, ACDF, hernia repair, right arm surgery, right CTR.   ?  Examination-Activity Limitations Other;Locomotion Level   ?  Examination-Participation Restrictions Other   ? Stability/Clinical Decision Making Stable/Uncomplicated   ? Rehab Potential Good   ? PT Frequency 2x / week   ? PT Duration 6 weeks   ? PT Treatment/Interventions ADLs/Self Care Home Management;Cryotherapy;Electrical Stimulation;Ultrasound;Traction;Moist Heat;Therapeutic activities;Therapeutic exercise;Manual techniques;Patient/family education;Passive range of motion;Dry needling   ? PT Next Visit Plan Core exercise progression, modalities and STW/M as needed.   ? Consulted and Agree with Plan of Care Patient   ? ?  ?  ? ?  ? ? ?Patient will benefit from skilled therapeutic intervention in order to improve the following deficits and impairments:  Decreased activity tolerance, Decreased range of motion, Decreased strength, Pain ? ?Visit Diagnosis: ?Chronic right-sided low back pain with right-sided sciatica ? ?Chronic pain of right knee ? ? ? ? ?Problem List ?Patient Active Problem List  ? Diagnosis Date Noted  ? Hyperlipidemia associated with type 2 diabetes mellitus (HCC) 06/23/2020  ? Status post revision of total replacement of right knee 04/10/2019  ? Loose right total knee arthroplasty (HCC) 03/09/2019  ? Neuropathic pain 01/01/2019  ? Cervical spine degeneration 12/30/2018  ? Acquired trigger finger of left middle finger 08/22/2018  ? S/P cervical spinal fusion 03/27/2018  ? Morbid obesity (HCC) 02/21/2018  ? Osteoarthritis of right glenohumeral joint 01/21/2018  ? Tear of right rotator cuff 12/17/2017  ? Recurrent nephrolithiasis 09/18/2017  ? Drug dependence (HCC) 08/19/2017  ? Diabetes mellitus type 2 in obese (HCC) 03/28/2017  ? Hypertension associated with diabetes (HCC) 08/25/2016  ? ? ?Marvell Fuller, PTA ?09/21/2021, 10:51 AM ? ?[place] ?[facility] ?[address] ?[place], [place], [address] ?Phone: [phone]   Fax:  [phone] ? ?Name: Alex [name] ?MRN:  [id number] ?Date of Birth: 1960-11-17 ? ? ? ?

## 2021-09-22 ENCOUNTER — Telehealth: Payer: Self-pay | Admitting: Orthopaedic Surgery

## 2021-09-22 NOTE — Telephone Encounter (Signed)
Patient aware.

## 2021-09-22 NOTE — Telephone Encounter (Signed)
Pt stating he had P.T yesterday and now knee cap and below is in extreme pain. Pt is unsure if this is normal or if he needs an appt to be seen due to the amount of pain he is in. The best call back number is [phone].  ?

## 2021-09-25 ENCOUNTER — Telehealth: Payer: Self-pay | Admitting: Orthopaedic Surgery

## 2021-09-25 ENCOUNTER — Other Ambulatory Visit: Payer: Self-pay | Admitting: Physician Assistant

## 2021-09-25 MED ORDER — HYDROCODONE-ACETAMINOPHEN 7.5-325 MG PO TABS: 1.0000 | ORAL_TABLET | Freq: Two times a day (BID) | ORAL | 0 refills | Status: DC | PRN

## 2021-09-25 NOTE — Telephone Encounter (Signed)
Pt called and states his left knee is in a lot of pain and he wants to be seen as soon as possible. I put him down for next Monday. Also would like refill on hydrocodone ?

## 2021-09-25 NOTE — Telephone Encounter (Signed)
Tried calling pt to inform but no answer and voice mail wasn't set up ?

## 2021-09-25 NOTE — Telephone Encounter (Signed)
I have advised the pt the RX was sent in  ?

## 2021-09-25 NOTE — Telephone Encounter (Signed)
Please advise 

## 2021-09-26 ENCOUNTER — Ambulatory Visit: Payer: Medicare HMO | Admitting: *Deleted

## 2021-09-26 ENCOUNTER — Telehealth: Payer: Self-pay | Admitting: Family Medicine

## 2021-09-26 MED ORDER — SEMAGLUTIDE (1 MG/DOSE) 4 MG/3ML ~~LOC~~ SOPN: 1.0000 mg | PEN_INJECTOR | SUBCUTANEOUS | 0 refills | Status: DC

## 2021-09-26 NOTE — Telephone Encounter (Signed)
Informed pt that his Ozempic is not here and that once it arrives we will call him. Pt would like for one pen to be sent to [facility] just in case the shipment is not here before his next injection date. ?

## 2021-09-26 NOTE — Telephone Encounter (Signed)
Patient calling in to check if we received his insulin shots. Please call back and let him know.  ?

## 2021-09-27 ENCOUNTER — Telehealth: Payer: Medicare HMO | Admitting: Pharmacist

## 2021-09-28 ENCOUNTER — Ambulatory Visit: Payer: Medicare HMO | Admitting: *Deleted

## 2021-09-28 ENCOUNTER — Other Ambulatory Visit: Payer: Self-pay

## 2021-09-28 DIAGNOSIS — G8929 Other chronic pain: Secondary | ICD-10-CM | POA: Diagnosis not present

## 2021-09-28 DIAGNOSIS — M25561 Pain in right knee: Secondary | ICD-10-CM | POA: Diagnosis not present

## 2021-09-28 DIAGNOSIS — M5441 Lumbago with sciatica, right side: Secondary | ICD-10-CM | POA: Diagnosis not present

## 2021-09-28 NOTE — Therapy (Signed)
[place] ?[facility] ?[address] ?[place], [place], [address] ?Phone: [phone]   Fax:  [phone] ? ?Physical Therapy Treatment ? ?Patient Details  ?Name: Alex Technologiese MedinaRonald Ripoll ?MRN: [id number] ?Date of Birth: [birth year] ?Referring Provider (PT): Allie Bossierhris Blackman MD ? ? ?Encounter Date: 09/28/2021 ? ? PT End of Session - 09/28/21 0914   ? ? Visit Number 3   ? Number of Visits 12   ? Date for PT Re-Evaluation 10/30/21   ? PT Start Time [phone]   ? PT Stop Time [phone]   ? PT Time Calculation (min) 50 min   ? ?  ?  ? ?  ? ? ?Past Medical History:  ?Diagnosis Date  ? Diabetes mellitus without complication (HCC)   ? History of kidney stones   ? removed 5 months ago  ? Hypertension   ? PONV (postoperative nausea and vomiting)   ? AGITATION after anesthesia as well  ? Sleep apnea   ? NO CPAP  ? ? ?Past Surgical History:  ?Procedure Laterality Date  ? ANTERIOR CERVICAL DECOMP/DISCECTOMY FUSION N/A 03/27/2018  ? Procedure: ANTERIOR CERVICAL DECOMPRESSION/DISCECTOMY FUSION C3-5;  Surgeon: Venita LickBrooks, Dahari, MD;  [facility];  Service: Orthopedics;  Laterality: N/A;  3.5 hrs  ? arm surgery Right   ? x2 involving tendons/ulnar nerve  ? BACK SURGERY    ? BIOPSY  10/17/2020  ? Procedure: BIOPSY;  Surgeon: Lanelle Balarver, Charles K, DO;  [facility];  Service: Endoscopy;;  ? CARPAL TUNNEL RELEASE Right   ? clavical surgery    ? ESOPHAGOGASTRODUODENOSCOPY (EGD) WITH PROPOFOL N/A 10/17/2020  ? Procedure: ESOPHAGOGASTRODUODENOSCOPY (EGD) WITH PROPOFOL;  Surgeon: Lanelle Balarver, Charles K, DO;  [facility];  Service: Endoscopy;  Laterality: N/A;  PM  ? EYE SURGERY Bilateral   ? glaucoma  ? HERNIA REPAIR    ? JOINT REPLACEMENT Right 06/2011  ? TOTAL KNEE REVISION Right 04/10/2019  ? Procedure: RIGHT TOTAL KNEE REVISION;  Surgeon: Kathryne HitchBlackman, Khushboo Chuck Y, MD;  Location: [facility] ORS;  Service: Orthopedics;  Laterality: Right;  ? TRICEPS TENDON REPAIR    ? ? ?There were no vitals filed for this visit. ? ?  Subjective Assessment - 09/28/21 0911   ? ? Subjective Reports pain going down into groin, adductors, abductors to shin of RLE. Reports that R knee hurting as well.Was painting yesterday and strained my left knee and am going to MD about it.   ? Pertinent History Right TKA and subsquent revision, DM, ACDF, hernia repair, right arm surgery, right CTR.   ? Diagnostic tests MRI:  Degenerative changes of the lumbar spine, more pronounced at the  level of the facet joints at L4-5 and L5-S1 where there is evidence  of active osteoarthritis.  2. Moderate right and severe left neural foraminal narrowing at  L5-S1.   ? Patient Stated Goals Get out of pain.   ? Currently in Pain? Yes   ? Pain Score 8    ? Pain Location Back   ? Pain Orientation Right   ? Pain Descriptors / Indicators Discomfort   ? Pain Type Chronic pain   ? Pain Onset More than a month ago   ? ?  ?  ? ?  ? ? ? ? ? ? ? ? ? ? ? ? ? ? ? ? ? ? ? ? [facility] Adult PT Treatment/Exercise - 09/28/21 0001   ? ?  ? Exercises  ? Exercises Lumbar   ?  ? Lumbar Exercises: Aerobic  ?  Nustep L4 x15 min   ?  ? Modalities  ? Modalities Electrical Stimulation;Ultrasound   ?  ? Electrical Stimulation  ? Electrical Stimulation Location R low back/ glute   ? Electrical Stimulation Action premod   ? Electrical Stimulation Parameters 80-150 hz x10   ? Electrical Stimulation Goals Pain;Tone   ?  ? Ultrasound  ? Ultrasound Location RT LB   ? Ultrasound Parameters 1.5 w/cm2 x 10 mins   ? Ultrasound Goals Pain   ? ?  ?  ? ?  ? ? ? ? ? ? ? ? ? ? ? ? ? ? ? PT Long Term Goals - 09/18/21 1026   ? ?  ? PT LONG TERM GOAL #1  ? Title Independent with a HEP.   ? Time 6   ? Period Weeks   ? Status New   ?  ? PT LONG TERM GOAL #2  ? Title Eliminate right LE symptoms.   ? Time 6   ? Period Weeks   ? Status New   ?  ? PT LONG TERM GOAL #3  ? Title Perform ADL's with pain not > 3/10.   ? Time 6   ? Period Weeks   ? Status New   ? ?  ?  ? ?  ? ? ? ? ? ? ? ? Plan - 09/28/21 0918   ? ? Clinical Impression  Statement Pt arrived today doing about the same with LBP. He reports that he hurt his LT knee the other day and is going to MD soon about it. He was able to perform exs today and then [place] combo and Estim were used for pain management with good response.   ? Personal Factors and Comorbidities Comorbidity 1;Comorbidity 2;Other   ? Comorbidities Right TKA and subsquent revision, DM, ACDF, hernia repair, right arm surgery, right CTR.   ? Examination-Activity Limitations Other;Locomotion Level   ? Stability/Clinical Decision Making Stable/Uncomplicated   ? Rehab Potential Good   ? PT Duration 6 weeks   ? PT Treatment/Interventions ADLs/Self Care Home Management;Cryotherapy;Electrical Stimulation;Ultrasound;Traction;Moist Heat;Therapeutic activities;Therapeutic exercise;Manual techniques;Patient/family education;Passive range of motion;Dry needling   ? PT Next Visit Plan Core exercise progression, modalities and STW/M as needed.   ? Consulted and Agree with Plan of Care Patient   ? ?  ?  ? ?  ? ? ?Patient will benefit from skilled therapeutic intervention in order to improve the following deficits and impairments:  Decreased activity tolerance, Decreased range of motion, Decreased strength, Pain ? ?Visit Diagnosis: ?Chronic right-sided low back pain with right-sided sciatica ? ? ? ? ?Problem List ?Patient Active Problem List  ? Diagnosis Date Noted  ? Hyperlipidemia associated with type 2 diabetes mellitus (HCC) 06/23/2020  ? Status post revision of total replacement of right knee 04/10/2019  ? Loose right total knee arthroplasty (HCC) 03/09/2019  ? Neuropathic pain 01/01/2019  ? Cervical spine degeneration 12/30/2018  ? Acquired trigger finger of left middle finger 08/22/2018  ? S/P cervical spinal fusion 03/27/2018  ? Morbid obesity (HCC) 02/21/2018  ? Osteoarthritis of right glenohumeral joint 01/21/2018  ? Tear of right rotator cuff 12/17/2017  ? Recurrent nephrolithiasis 09/18/2017  ? Drug dependence (HCC) 08/19/2017   ? Diabetes mellitus type 2 in obese (HCC) 03/28/2017  ? Hypertension associated with diabetes (HCC) 08/25/2016  ? ? ?Teletha Petrea,CHRIS, PTA ?09/28/2021, 1:23 PM ? ?[place] ?[facility] ?[address] ?[place], [place], [address] ?Phone: [phone]   Fax:  [phone] ? ?Name:  Alex [name] ?MRN: [id number] ?Date of Birth: [birth year] ? ? ? ?

## 2021-10-02 ENCOUNTER — Ambulatory Visit (INDEPENDENT_AMBULATORY_CARE_PROVIDER_SITE_OTHER): Payer: Medicare HMO

## 2021-10-02 ENCOUNTER — Ambulatory Visit (INDEPENDENT_AMBULATORY_CARE_PROVIDER_SITE_OTHER): Payer: Medicare HMO | Admitting: Physician Assistant

## 2021-10-02 DIAGNOSIS — M25562 Pain in left knee: Secondary | ICD-10-CM

## 2021-10-02 DIAGNOSIS — R6889 Other general symptoms and signs: Secondary | ICD-10-CM | POA: Diagnosis not present

## 2021-10-02 DIAGNOSIS — M1712 Unilateral primary osteoarthritis, left knee: Secondary | ICD-10-CM

## 2021-10-02 MED ORDER — LIDOCAINE HCL 1 % IJ SOLN
3.0000 mL | INTRAMUSCULAR | Status: AC | PRN
  Administered 2021-10-02: 3 mL

## 2021-10-02 MED ORDER — METHYLPREDNISOLONE ACETATE 40 MG/ML IJ SUSP
40.0000 mg | INTRAMUSCULAR | Status: AC | PRN
  Administered 2021-10-02: 40 mg via INTRA_ARTICULAR

## 2021-10-02 NOTE — Progress Notes (Signed)
? ?Office Visit Note ?  ?Patient: Alex [name]           ?Date of Birth: 22-Apr-1961           ?MRN: [id number] ?Visit Date: 10/02/2021 ?             ?Requested by: Nils Pyle, MD ?[address] ?[place],  [place] [address] ?PCP: Dettinger, Elige Radon, MD ? ? ?Assessment & Plan: ?Visit Diagnoses:  ?1. Primary osteoarthritis of left knee   ? ? ?Plan: Quad strengthening exercises were reviewed with the patient.  I had him demonstrate these back to me.  He understands to wait at least 3 months between cortisone injections in the left knee.  He understands to watch his glucose levels closely over the next 24 to 48 hours. ? ?Follow-Up Instructions: No follow-ups on file.  ? ?Orders:  ?Orders Placed This Encounter  ?Procedures  ? Large Joint Inj: L knee  ? XR KNEE 3 VIEW LEFT  ? ?No orders of the defined types were placed in this encounter. ? ? ? ? Procedures: ?Large Joint Inj: L knee on 10/02/2021 11:35 AM ?Indications: pain ?Details: 22 G 1.5 in needle, anterolateral approach ? ?Arthrogram: No ? ?Medications: 3 mL lidocaine 1 %; 40 mg methylPREDNISolone acetate 40 MG/ML ?Outcome: tolerated well, no immediate complications ?Procedure, treatment alternatives, risks and benefits explained, specific risks discussed. Consent was given by the patient. Immediately prior to procedure a time out was called to verify the correct patient, procedure, equipment, support staff and site/side marked as required. Patient was prepped and draped in the usual sterile fashion.  ? ? ? ? ?Clinical Data: ?No additional findings. ? ? ?Subjective: ?Chief Complaint  ?Patient presents with  ? Left Knee - Pain  ? ? ?HPI ?Mr. Pucci comes in today with left knee pain.  Reports that his pain began last week after he had the knee bent for extended period of time.  He states is painful to weight-bear and bend the knee.  He notes clicking in the knee.  He notes some swelling about the knee.  No significant injury.  He does take oral diclofenac which  helps some with the pain.  He is diabetic last hemoglobin A1c is reported as 6.1. ?Review of Systems ?Negative for fevers chills. ? ?Objective: ?Vital Signs: There were no vitals taken for this visit. ? ?Physical Exam ?General well-developed well-nourished male in no acute distress. ?Psych: Alert and oriented x3. ?Ortho Exam ?Bilateral knees no abnormal warmth erythema or effusion.  Left knee patella tends to track laterally there is definite clicking in the patellofemoral region with passive range of motion of the left knee.  No instability valgus varus stressing.  McMurray's negative.  Bilateral quad atrophy well-healed surgical incision right knee. ?Specialty Comments:  ?No specialty comments available. ? ?Imaging: ?XR KNEE 3 VIEW LEFT ? ?Result Date: 10/02/2021 ?Left knee 2 views: No acute fracture.  Mild to moderate narrowing medial joint line.  Mild patellofemoral arthritic changes.  Knee is well located.  No bony abnormalities otherwise.  ? ? ?PMFS History: ?Patient Active Problem List  ? Diagnosis Date Noted  ? Hyperlipidemia associated with type 2 diabetes mellitus (HCC) 06/23/2020  ? Status post revision of total replacement of right knee 04/10/2019  ? Loose right total knee arthroplasty (HCC) 03/09/2019  ? Neuropathic pain 01/01/2019  ? Cervical spine degeneration 12/30/2018  ? Acquired trigger finger of left middle finger 08/22/2018  ? S/P cervical spinal fusion 03/27/2018  ?  Morbid obesity (HCC) 02/21/2018  ? Osteoarthritis of right glenohumeral joint 01/21/2018  ? Tear of right rotator cuff 12/17/2017  ? Recurrent nephrolithiasis 09/18/2017  ? Drug dependence (HCC) 08/19/2017  ? Diabetes mellitus type 2 in obese (HCC) 03/28/2017  ? Hypertension associated with diabetes (HCC) 08/25/2016  ? ?Past Medical History:  ?Diagnosis Date  ? Diabetes mellitus without complication (HCC)   ? History of kidney stones   ? removed 5 months ago  ? Hypertension   ? PONV (postoperative nausea and vomiting)   ? AGITATION  after anesthesia as well  ? Sleep apnea   ? NO CPAP  ?  ?Family History  ?Problem Relation Age of Onset  ? Cancer Mother 73  ?     small intestinal  ? Diabetes Father   ? Hypertension Father   ? Heart attack Father   ? Early death Sister   ? Brain cancer Brother   ? Colon cancer Neg Hx   ?  ?Past Surgical History:  ?Procedure Laterality Date  ? ANTERIOR CERVICAL DECOMP/DISCECTOMY FUSION N/A 03/27/2018  ? Procedure: ANTERIOR CERVICAL DECOMPRESSION/DISCECTOMY FUSION C3-5;  Surgeon: Venita Lick, MD;  Location: [facility] OR;  Service: Orthopedics;  Laterality: N/A;  3.5 hrs  ? arm surgery Right   ? x2 involving tendons/ulnar nerve  ? BACK SURGERY    ? BIOPSY  10/17/2020  ? Procedure: BIOPSY;  Surgeon: Lanelle Bal, DO;  [facility];  Service: Endoscopy;;  ? CARPAL TUNNEL RELEASE Right   ? clavical surgery    ? ESOPHAGOGASTRODUODENOSCOPY (EGD) WITH PROPOFOL N/A 10/17/2020  ? Procedure: ESOPHAGOGASTRODUODENOSCOPY (EGD) WITH PROPOFOL;  Surgeon: Lanelle Bal, DO;  [facility];  Service: Endoscopy;  Laterality: N/A;  PM  ? EYE SURGERY Bilateral   ? glaucoma  ? HERNIA REPAIR    ? JOINT REPLACEMENT Right 06/2011  ? TOTAL KNEE REVISION Right 04/10/2019  ? Procedure: RIGHT TOTAL KNEE REVISION;  Surgeon: Kathryne Hitch, MD;  Location: [facility];  Service: Orthopedics;  Laterality: Right;  ? TRICEPS TENDON REPAIR    ? ?Social History  ? ?Occupational History  ? Not on file  ?Tobacco Use  ? Smoking status: Never  ? Smokeless tobacco: Current  ?  Types: Chew  ?Vaping Use  ? Vaping Use: Never used  ?Substance and Sexual Activity  ? Alcohol use: Not Currently  ?  Comment: Quit EtOH 20 years ago  ? Drug use: No  ? Sexual activity: Not on file  ? ? ? ? ? ? ?

## 2021-10-03 ENCOUNTER — Ambulatory Visit: Payer: Medicare HMO | Admitting: *Deleted

## 2021-10-04 ENCOUNTER — Encounter: Payer: Self-pay | Admitting: Orthopaedic Surgery

## 2021-10-04 ENCOUNTER — Other Ambulatory Visit: Payer: Self-pay

## 2021-10-04 ENCOUNTER — Ambulatory Visit (INDEPENDENT_AMBULATORY_CARE_PROVIDER_SITE_OTHER): Payer: Medicare HMO | Admitting: Orthopaedic Surgery

## 2021-10-04 DIAGNOSIS — R6889 Other general symptoms and signs: Secondary | ICD-10-CM | POA: Diagnosis not present

## 2021-10-04 DIAGNOSIS — M7061 Trochanteric bursitis, right hip: Secondary | ICD-10-CM | POA: Diagnosis not present

## 2021-10-04 MED ORDER — LIDOCAINE HCL 1 % IJ SOLN
3.0000 mL | INTRAMUSCULAR | Status: AC | PRN
  Administered 2021-10-04: 3 mL

## 2021-10-04 MED ORDER — METHYLPREDNISOLONE ACETATE 40 MG/ML IJ SUSP
40.0000 mg | INTRAMUSCULAR | Status: AC | PRN
  Administered 2021-10-04: 40 mg via INTRA_ARTICULAR

## 2021-10-04 NOTE — Progress Notes (Signed)
? ?  Procedure Note ? ?Patient: Alex [name]             ?Date of Birth: 10-26-1960           ?MRN: [id number]             ?Visit Date: 10/04/2021 ?HPI: Mr. Thayer Jew returns today new complaint of right lateral hip pain.  He states the pain just "hurts.  He denies any numbness tingling down the leg.  Pain is worse when he lies on the hip at night.  He reports that his left knee which she had injected on 10/02/2021 is overall doing better.  He also reports that his glucose levels have not been over 100 and actually over 100 this morning whenever he checks them. ? ?Review of systems see HPI otherwise negative ? ?Physical exam: Right hip good range of motion without pain.  Tenderness over the right trochanteric region.  No rashes skin lesions ulcerations over the region of the right hip. ? ? ?Procedures: ?Visit Diagnoses:  ?1. Trochanteric bursitis, right hip   ? ? ?Large Joint Inj: R greater trochanter on 10/04/2021 9:11 AM ?Indications: pain ?Details: 22 G 1.5 in needle, lateral approach ? ?Arthrogram: No ? ?Medications: 3 mL lidocaine 1 %; 40 mg methylPREDNISolone acetate 40 MG/ML ?Outcome: tolerated well, no immediate complications ?Procedure, treatment alternatives, risks and benefits explained, specific risks discussed. Consent was given by the patient. Immediately prior to procedure a time out was called to verify the correct patient, procedure, equipment, support staff and site/side marked as required. Patient was prepped and draped in the usual sterile fashion.  ? ? ?Plan: He shown IT band stretching exercises.  We will see him back in 2 weeks for his left knee.  At that time we will reevaluate his right hip.  Did discuss with him that this may be a precursor to back issue if he develops any numbness tingling or radicular symptoms down the right leg will need to work his back up source of the hip pain.  Questions were encouraged and answered.  He will monitor his glucose levels closely. ? ? ?

## 2021-10-05 ENCOUNTER — Ambulatory Visit: Payer: Medicare HMO | Admitting: *Deleted

## 2021-10-05 DIAGNOSIS — G8929 Other chronic pain: Secondary | ICD-10-CM | POA: Diagnosis not present

## 2021-10-05 DIAGNOSIS — M25561 Pain in right knee: Secondary | ICD-10-CM | POA: Diagnosis not present

## 2021-10-05 DIAGNOSIS — M5441 Lumbago with sciatica, right side: Secondary | ICD-10-CM | POA: Diagnosis not present

## 2021-10-05 NOTE — Therapy (Signed)
[place] ?[facility] ?[address] ?[place], [place], [address] ?Phone: [phone]   Fax:  [phone] ? ?Physical Therapy Treatment ? ?Patient Details  ?Name: Alex MedinaRonald [name] ?MRN: [id number] ?Date of Birth: 13-Feb-1961 ?Referring Provider (PT): Allie Bossierhris Blackman MD ? ? ?Encounter Date: 10/05/2021 ? ? PT End of Session - 10/05/21 [id number]   ? ? Visit Number 4   ? Number of Visits 12   ? Date for PT Re-Evaluation 10/30/21   ? PT Start Time 0915   ? PT Stop Time 1008   ? PT Time Calculation (min) 53 min   ? ?  ?  ? ?  ? ? ?Past Medical History:  ?Diagnosis Date  ? Diabetes mellitus without complication (HCC)   ? History of kidney stones   ? removed 5 months ago  ? Hypertension   ? PONV (postoperative nausea and vomiting)   ? AGITATION after anesthesia as well  ? Sleep apnea   ? NO CPAP  ? ? ?Past Surgical History:  ?Procedure Laterality Date  ? ANTERIOR CERVICAL DECOMP/DISCECTOMY FUSION N/A 03/27/2018  ? Procedure: ANTERIOR CERVICAL DECOMPRESSION/DISCECTOMY FUSION C3-5;  Surgeon: Venita LickBrooks, Dahari, MD;  [facility];  Service: Orthopedics;  Laterality: N/A;  3.5 hrs  ? arm surgery Right   ? x2 involving tendons/ulnar nerve  ? BACK SURGERY    ? BIOPSY  10/17/2020  ? Procedure: BIOPSY;  Surgeon: Lanelle Balarver, Charles K, DO;  [facility];  Service: Endoscopy;;  ? CARPAL TUNNEL RELEASE Right   ? clavical surgery    ? ESOPHAGOGASTRODUODENOSCOPY (EGD) WITH PROPOFOL N/A 10/17/2020  ? Procedure: ESOPHAGOGASTRODUODENOSCOPY (EGD) WITH PROPOFOL;  Surgeon: Lanelle Balarver, Charles K, DO;  [facility];  Service: Endoscopy;  Laterality: N/A;  PM  ? EYE SURGERY Bilateral   ? glaucoma  ? HERNIA REPAIR    ? JOINT REPLACEMENT Right 06/2011  ? TOTAL KNEE REVISION Right 04/10/2019  ? Procedure: RIGHT TOTAL KNEE REVISION;  Surgeon: Kathryne HitchBlackman, Zamaria Brazzle Y, MD;  Location: [facility];  Service: Orthopedics;  Laterality: Right;  ? TRICEPS TENDON REPAIR    ? ? ?There were no vitals filed for this visit. ? ?  Subjective Assessment - 10/05/21 0920   ? ? Subjective Pt reports visit with MD about RT hip pain and recieved injection and feels better. Able to donn socks easier this morning   ? Pertinent History Right TKA and subsquent revision, DM, ACDF, hernia repair, right arm surgery, right CTR.   ? Diagnostic tests MRI:  Degenerative changes of the lumbar spine, more pronounced at the  level of the facet joints at L4-5 and L5-S1 where there is evidence  of active osteoarthritis.  2. Moderate right and severe left neural foraminal narrowing at  L5-S1.   ? Patient Stated Goals Get out of pain.   ? Currently in Pain? Yes   ? Pain Score 7    ? Pain Location Back   ? Pain Orientation Right   ? Pain Descriptors / Indicators Discomfort   ? Pain Type Chronic pain   ? Pain Onset More than a month ago   ? ?  ?  ? ?  ? ? ? ? ? ? ? ? ? ? ? ? ? ? ? ? ? ? ? ? [facility] Adult PT Treatment/Exercise - 10/05/21 0001   ? ?  ? Exercises  ? Exercises Lumbar   ?  ? Lumbar Exercises: Aerobic  ? Nustep L4 x15 min   ?  ? Lumbar  Exercises: Supine  ? Ab Set 10 reps;5 seconds   Drawin for TA activation/control  ? Other Supine Lumbar Exercises SOC for pelvic control and trying to find neutral position for less pain   ? Other Supine Lumbar Exercises supine pelvic tilts for pelvic control   ?  ? Modalities  ? Modalities Electrical Stimulation;Ultrasound   ?  ? Electrical Stimulation  ? Electrical Stimulation Location R low back/ glute   ? Electrical Stimulation Action IFC x 15 mins   ? Electrical Stimulation Parameters 80-150hz    ? Electrical Stimulation Goals Pain;Tone   ? ?  ?  ? ?  ? ? ? ? ? ? ? ? ? ? ? ? ? ? ? PT Long Term Goals - 10/05/21 0925   ? ?  ? PT LONG TERM GOAL #1  ? Title Independent with a HEP.   ? Time 6   ? Period Weeks   ? Status Achieved   ?  ? PT LONG TERM GOAL #2  ? Title Eliminate right LE symptoms.   ? Time 6   ? Period Weeks   ? Status On-going   ?  ? PT LONG TERM GOAL #3  ? Title Perform ADL's with pain not > 3/10.   ? Time 6   ?  Period Weeks   ? Status On-going   ? ?  ?  ? ?  ? ? ? ? ? ? ? ? Plan - 10/05/21 0924   ? ? Clinical Impression Statement Pt arrived today doing about the same in LB, but reports decreased RT hip pain due to injection. Rx focused on Pelvic controla nd TA activation f/b estim for pain control.. Review HEP   ? Personal Factors and Comorbidities Comorbidity 1;Comorbidity 2;Other   ? Comorbidities Right TKA and subsquent revision, DM, ACDF, hernia repair, right arm surgery, right CTR.   ? Examination-Participation Restrictions Other   ? Stability/Clinical Decision Making Stable/Uncomplicated   ? Rehab Potential Good   ? PT Frequency 2x / week   ? PT Duration 6 weeks   ? PT Treatment/Interventions ADLs/Self Care Home Management;Cryotherapy;Electrical Stimulation;Ultrasound;Traction;Moist Heat;Therapeutic activities;Therapeutic exercise;Manual techniques;Patient/family education;Passive range of motion;Dry needling   ? PT Next Visit Plan Core exercise progression, modalities and STW/M as needed.   ? Consulted and Agree with Plan of Care Patient   ? ?  ?  ? ?  ? ? ?Patient will benefit from skilled therapeutic intervention in order to improve the following deficits and impairments:  Decreased activity tolerance, Decreased range of motion, Decreased strength, Pain ? ?Visit Diagnosis: ?Chronic right-sided low back pain with right-sided sciatica ? ? ? ? ?Problem List ?Patient Active Problem List  ? Diagnosis Date Noted  ? Hyperlipidemia associated with type 2 diabetes mellitus (HCC) 06/23/2020  ? Status post revision of total replacement of right knee 04/10/2019  ? Loose right total knee arthroplasty (HCC) 03/09/2019  ? Neuropathic pain 01/01/2019  ? Cervical spine degeneration 12/30/2018  ? Acquired trigger finger of left middle finger 08/22/2018  ? S/P cervical spinal fusion 03/27/2018  ? Morbid obesity (HCC) 02/21/2018  ? Osteoarthritis of right glenohumeral joint 01/21/2018  ? Tear of right rotator cuff 12/17/2017  ?  Recurrent nephrolithiasis 09/18/2017  ? Drug dependence (HCC) 08/19/2017  ? Diabetes mellitus type 2 in obese (HCC) 03/28/2017  ? Hypertension associated with diabetes (HCC) 08/25/2016  ? ? ?Tayte Mcwherter,CHRIS, PTA ?10/05/2021, 10:40 AM ? ?[place] ?[facility] ?[address] ?[place], [place], [address] ?Phone: [phone]  Fax:  [phone] ? ?Name: Alex [name] ?MRN: [id number] ?Date of Birth: 1960/09/03 ? ? ? ?

## 2021-10-06 ENCOUNTER — Ambulatory Visit: Payer: Medicare HMO | Admitting: *Deleted

## 2021-10-06 DIAGNOSIS — M25561 Pain in right knee: Secondary | ICD-10-CM | POA: Diagnosis not present

## 2021-10-06 DIAGNOSIS — G8929 Other chronic pain: Secondary | ICD-10-CM

## 2021-10-06 DIAGNOSIS — M5441 Lumbago with sciatica, right side: Secondary | ICD-10-CM | POA: Diagnosis not present

## 2021-10-06 NOTE — Therapy (Signed)
[place] ?[facility] ?[address] ?[place], [place], [address] ?Phone: [phone]   Fax:  [phone] ? ?Physical Therapy Treatment ? ?Patient Details  ?Name: Alex [name] ?MRN: [id number] ?Date of Birth: 19-Dec-1960 ?Referring Provider (PT): Allie Bossier MD ? ? ?Encounter Date: 10/06/2021 ? ? PT End of Session - 10/06/21 0921   ? ? Visit Number 5   ? Number of Visits 12   ? Date for PT Re-Evaluation 10/30/21   ? PT Start Time [phone]   ? PT Stop Time [phone]   ? PT Time Calculation (min) 47 min   ? ?  ?  ? ?  ? ? ?Past Medical History:  ?Diagnosis Date  ? Diabetes mellitus without complication (HCC)   ? History of kidney stones   ? removed 5 months ago  ? Hypertension   ? PONV (postoperative nausea and vomiting)   ? AGITATION after anesthesia as well  ? Sleep apnea   ? NO CPAP  ? ? ?Past Surgical History:  ?Procedure Laterality Date  ? ANTERIOR CERVICAL DECOMP/DISCECTOMY FUSION N/A 03/27/2018  ? Procedure: ANTERIOR CERVICAL DECOMPRESSION/DISCECTOMY FUSION C3-5;  Surgeon: Venita Lick, MD;  Location: [facility] OR;  Service: Orthopedics;  Laterality: N/A;  3.5 hrs  ? arm surgery Right   ? x2 involving tendons/ulnar nerve  ? BACK SURGERY    ? BIOPSY  10/17/2020  ? Procedure: BIOPSY;  Surgeon: Lanelle Bal, DO;  [facility];  Service: Endoscopy;;  ? CARPAL TUNNEL RELEASE Right   ? clavical surgery    ? ESOPHAGOGASTRODUODENOSCOPY (EGD) WITH PROPOFOL N/A 10/17/2020  ? Procedure: ESOPHAGOGASTRODUODENOSCOPY (EGD) WITH PROPOFOL;  Surgeon: Lanelle Bal, DO;  [facility];  Service: Endoscopy;  Laterality: N/A;  PM  ? EYE SURGERY Bilateral   ? glaucoma  ? HERNIA REPAIR    ? JOINT REPLACEMENT Right 06/2011  ? TOTAL KNEE REVISION Right 04/10/2019  ? Procedure: RIGHT TOTAL KNEE REVISION;  Surgeon: Kathryne Hitch, MD;  Location: [facility];  Service: Orthopedics;  Laterality: Right;  ? TRICEPS TENDON REPAIR    ? ? ?There were no vitals filed for this visit. ? ?  Subjective Assessment - 10/06/21 0920   ? ? Subjective Pt reports still doing ok today. LBP 6/10   ? Pertinent History Right TKA and subsquent revision, DM, ACDF, hernia repair, right arm surgery, right CTR.   ? Diagnostic tests MRI:  Degenerative changes of the lumbar spine, more pronounced at the  level of the facet joints at L4-5 and L5-S1 where there is evidence  of active osteoarthritis.  2. Moderate right and severe left neural foraminal narrowing at  L5-S1.   ? Patient Stated Goals Get out of pain.   ? Pain Score 6    ? Pain Location Back   ? Pain Orientation Right   ? Pain Descriptors / Indicators Discomfort   ? ?  ?  ? ?  ? ? ? ? ? ? ? ? ? ? ? ? ? ? ? ? ? ? ? ? [facility] Adult PT Treatment/Exercise - 10/06/21 0001   ? ?  ? Exercises  ? Exercises Lumbar   ?  ? Lumbar Exercises: Aerobic  ? Nustep L4 x15 min   ?  ? Lumbar Exercises: Standing  ? Shoulder Extension Strengthening;20 reps   XTS blue 2x10 hold 5 secs with AB bracing and breathing normally. Needs cues for technique  ?  ? Lumbar Exercises: Supine  ? Ab Set 10  reps;5 seconds   Drawin for TA activation/control  ? Other Supine Lumbar Exercises Discussed SOC for pelvic control   ? Other Supine Lumbar Exercises Discussed supine pelvic tilts for pelvic control   ?  ? Modalities  ? Modalities Electrical Stimulation;Ultrasound   ?  ? Electrical Stimulation  ? Electrical Stimulation Location R low back/ glute   ? Electrical Stimulation Action IFC x15 mins   ? Electrical Stimulation Parameters 80-150hz    ? Electrical Stimulation Goals Pain;Tone   ? ?  ?  ? ?  ? ? ? ? ? ? ? ? ? ? ? ? ? ? ? PT Long Term Goals - 10/05/21 0925   ? ?  ? PT LONG TERM GOAL #1  ? Title Independent with a HEP.   ? Time 6   ? Period Weeks   ? Status Achieved   ?  ? PT LONG TERM GOAL #2  ? Title Eliminate right LE symptoms.   ? Time 6   ? Period Weeks   ? Status On-going   ?  ? PT LONG TERM GOAL #3  ? Title Perform ADL's with pain not > 3/10.   ? Time 6   ? Period Weeks   ? Status On-going    ? ?  ?  ? ?  ? ? ? ? ? ? ? ? Plan - 10/06/21 1059   ? ? Clinical Impression Statement Pt arrived today doing fairly well with moderate pain in LB. He was able to continue with core activation and postural exs. Estim end of session   ? Comorbidities Right TKA and subsquent revision, DM, ACDF, hernia repair, right arm surgery, right CTR.   ? Examination-Participation Restrictions Other   ? Stability/Clinical Decision Making Stable/Uncomplicated   ? Rehab Potential Good   ? PT Duration 6 weeks   ? PT Treatment/Interventions ADLs/Self Care Home Management;Cryotherapy;Electrical Stimulation;Ultrasound;Traction;Moist Heat;Therapeutic activities;Therapeutic exercise;Manual techniques;Patient/family education;Passive range of motion;Dry needling   ? PT Next Visit Plan Core exercise progression, modalities and STW/M as needed.   ? Consulted and Agree with Plan of Care Patient   ? ?  ?  ? ?  ? ? ?Patient will benefit from skilled therapeutic intervention in order to improve the following deficits and impairments:  Decreased activity tolerance, Decreased range of motion, Decreased strength, Pain ? ?Visit Diagnosis: ?Chronic right-sided low back pain with right-sided sciatica ? ? ? ? ?Problem List ?Patient Active Problem List  ? Diagnosis Date Noted  ? Hyperlipidemia associated with type 2 diabetes mellitus (HCC) 06/23/2020  ? Status post revision of total replacement of right knee 04/10/2019  ? Loose right total knee arthroplasty (HCC) 03/09/2019  ? Neuropathic pain 01/01/2019  ? Cervical spine degeneration 12/30/2018  ? Acquired trigger finger of left middle finger 08/22/2018  ? S/P cervical spinal fusion 03/27/2018  ? Morbid obesity (HCC) 02/21/2018  ? Osteoarthritis of right glenohumeral joint 01/21/2018  ? Tear of right rotator cuff 12/17/2017  ? Recurrent nephrolithiasis 09/18/2017  ? Drug dependence (HCC) 08/19/2017  ? Diabetes mellitus type 2 in obese (HCC) 03/28/2017  ? Hypertension associated with diabetes (HCC)  08/25/2016  ? ? ?Alex [name],Alex [name], Alex [name] ?10/06/2021, 11:13 AM ? ?[place] ?[facility] ?[address] ?[place], [place], [address] ?Phone: [phone]   Fax:  [phone] ? ?Name: Alex [name] ?MRN: [id number] ?Date of Birth: 1961-02-26 ? ? ? ?

## 2021-10-10 ENCOUNTER — Telehealth: Payer: Self-pay | Admitting: Physician Assistant

## 2021-10-10 ENCOUNTER — Ambulatory Visit: Payer: Medicare HMO | Attending: Orthopaedic Surgery | Admitting: *Deleted

## 2021-10-10 DIAGNOSIS — M7061 Trochanteric bursitis, right hip: Secondary | ICD-10-CM

## 2021-10-10 DIAGNOSIS — M5441 Lumbago with sciatica, right side: Secondary | ICD-10-CM | POA: Diagnosis not present

## 2021-10-10 DIAGNOSIS — G8929 Other chronic pain: Secondary | ICD-10-CM | POA: Diagnosis not present

## 2021-10-10 DIAGNOSIS — M25551 Pain in right hip: Secondary | ICD-10-CM | POA: Insufficient documentation

## 2021-10-10 NOTE — Telephone Encounter (Signed)
Is this ok?

## 2021-10-10 NOTE — Telephone Encounter (Signed)
Called pt and advised.  

## 2021-10-10 NOTE — Telephone Encounter (Signed)
Patient called he was advised by (PT) that he need a referral in order to have (PT) on his right hip and right leg.  The number to contact patient is [phone] ?

## 2021-10-10 NOTE — Therapy (Signed)
[place] ?[facility] ?[place] ?[place], [place], [address] ?Phone: [phone]   Fax:  [phone] ? ?Physical Therapy Treatment ? ?Patient Details  ?Name: Alex [name] ?MRN: [id number] ?Date of Birth: 02-Jul-1961 ?Referring Provider (PT): Zollie Beckers MD ? ? ?Encounter Date: 10/10/2021 ? ? PT End of Session - 10/10/21 1341   ? ? Visit Number 6   ? Number of Visits 12   ? Date for PT Re-Evaluation 10/30/21   ? PT Start Time 0908   ? PT Stop Time [id number]   ? PT Time Calculation (min) 45 min   ? ?  ?  ? ?  ? ? ?Past Medical History:  ?Diagnosis Date  ? Diabetes mellitus without complication ([place])   ? History of kidney stones   ? removed 5 months ago  ? Hypertension   ? PONV (postoperative nausea and vomiting)   ? AGITATION after anesthesia as well  ? Sleep apnea   ? NO CPAP  ? ? ?Past Surgical History:  ?Procedure Laterality Date  ? ANTERIOR CERVICAL DECOMP/DISCECTOMY FUSION N/A 03/27/2018  ? Procedure: ANTERIOR CERVICAL DECOMPRESSION/DISCECTOMY FUSION C3-5;  Surgeon: Melina Schools, MD;  Location: [facility];  Service: Orthopedics;  Laterality: N/A;  3.5 hrs  ? arm surgery Right   ? x2 involving tendons/ulnar nerve  ? BACK SURGERY    ? BIOPSY  10/17/2020  ? Procedure: BIOPSY;  Surgeon: Eloise Harman, DO;  [facility];  Service: Endoscopy;;  ? CARPAL TUNNEL RELEASE Right   ? clavical surgery    ? ESOPHAGOGASTRODUODENOSCOPY (EGD) WITH PROPOFOL N/A 10/17/2020  ? Procedure: ESOPHAGOGASTRODUODENOSCOPY (EGD) WITH PROPOFOL;  Surgeon: Eloise Harman, DO;  [facility];  Service: Endoscopy;  Laterality: N/A;  PM  ? EYE SURGERY Bilateral   ? glaucoma  ? HERNIA REPAIR    ? JOINT REPLACEMENT Right 06/2011  ? TOTAL KNEE REVISION Right 04/10/2019  ? Procedure: RIGHT TOTAL KNEE REVISION;  Surgeon: Mcarthur Rossetti, MD;  [facility];  Service: Orthopedics;  Laterality: Right;  ? TRICEPS TENDON REPAIR    ? ? ?There were no vitals filed for this visit. ? ? Subjective  Assessment - 10/10/21 0918   ? ? Subjective Pt reports doing okay , but having family issues that  he is dealing with. Sore mm after last Rx   ? Pertinent History Right TKA and subsquent revision, DM, ACDF, hernia repair, right arm surgery, right CTR.   ? Diagnostic tests MRI:  Degenerative changes of the lumbar spine, more pronounced at the  level of the facet joints at L4-5 and L5-S1 where there is evidence  of active osteoarthritis.  2. Moderate right and severe left neural foraminal narrowing at  L5-S1.   ? Patient Stated Goals Get out of pain.   ? Currently in Pain? Yes   ? Pain Score 5    ? Pain Location Back   ? Pain Orientation Right   ? Pain Type Chronic pain   ? Pain Onset More than a month ago   ? ?  ?  ? ?  ? ? ? ? ? ? ? ? ? ? ? ? ? ? ? ? ? ? ? ? [place] Adult PT Treatment/Exercise - 10/10/21 0001   ? ?  ? Exercises  ? Exercises Lumbar   ?  ? Lumbar Exercises: Aerobic  ? Nustep L4 x15 min   ?  ? Lumbar Exercises: Standing  ? Shoulder Extension Strengthening;20 reps;10 reps  XTS blue 3 x10 hold 5 secs with AB bracing and breathing normally. Needs cues for technique  ?  ? Lumbar Exercises: Supine  ? Ab Set 10 reps;5 seconds   Drawin for TA activation/control  ?  ? Modalities  ? Modalities Electrical Stimulation;Ultrasound   ?  ? Electrical Stimulation  ? Electrical Stimulation Location R low back/ glute   ? Electrical Stimulation Action IFC x 15 mins   ? Electrical Stimulation Parameters 80-150hz  x 15 mins   ? Electrical Stimulation Goals Pain;Tone   ?  ? Ultrasound  ? Ultrasound Location RT LB/SIJ area   ? Ultrasound Parameters 1.5 w/cm2 x8 mins   ? Ultrasound Goals Pain   ? ?  ?  ? ?  ? ? ? ? ? ? ? ? ? ? ? ? ? ? ? PT Long Term Goals - 10/05/21 0925   ? ?  ? PT LONG TERM GOAL #1  ? Title Independent with a HEP.   ? Time 6   ? Period Weeks   ? Status Achieved   ?  ? PT LONG TERM GOAL #2  ? Title Eliminate right LE symptoms.   ? Time 6   ? Period Weeks   ? Status On-going   ?  ? PT LONG TERM GOAL #3  ? Title  Perform ADL's with pain not > 3/10.   ? Time 6   ? Period Weeks   ? Status On-going   ? ?  ?  ? ?  ? ? ? ? ? ? ? ? Plan - 10/10/21 1537   ? ? Clinical Impression Statement Pt arrived today doing fair, but reports his hip pain is back and that MD will send N.O for hip eval. Rx focused on core  activation exs as well as pain control with combo and estim.Pt reports decreased pain end of session and reports mm soreness from core exs.   ? Personal Factors and Comorbidities Comorbidity 1;Comorbidity 2;Other   ? Comorbidities Right TKA and subsquent revision, DM, ACDF, hernia repair, right arm surgery, right CTR.   ? Examination-Activity Limitations Other;Locomotion Level   ? Stability/Clinical Decision Making Stable/Uncomplicated   ? Rehab Potential Good   ? PT Frequency 2x / week   ? PT Treatment/Interventions ADLs/Self Care Home Management;Cryotherapy;Electrical Stimulation;Ultrasound;Traction;Moist Heat;Therapeutic activities;Therapeutic exercise;Manual techniques;Patient/family education;Passive range of motion;Dry needling   ? PT Next Visit Plan Core exercise progression, modalities and STW/M as needed.   ? Consulted and Agree with Plan of Care Patient   ? ?  ?  ? ?  ? ? ?Patient will benefit from skilled therapeutic intervention in order to improve the following deficits and impairments:  Decreased activity tolerance, Decreased range of motion, Decreased strength, Pain ? ?Visit Diagnosis: ?Chronic right-sided low back pain with right-sided sciatica ? ? ? ? ?Problem List ?Patient Active Problem List  ? Diagnosis Date Noted  ? Hyperlipidemia associated with type 2 diabetes mellitus ([place]) 06/23/2020  ? Status post revision of total replacement of right knee 04/10/2019  ? Loose right total knee arthroplasty ([place]) 03/09/2019  ? Neuropathic pain 01/01/2019  ? Cervical spine degeneration 12/30/2018  ? Acquired trigger finger of left middle finger 08/22/2018  ? S/P cervical spinal fusion 03/27/2018  ? Morbid obesity ([place])  02/21/2018  ? Osteoarthritis of right glenohumeral joint 01/21/2018  ? Tear of right rotator cuff 12/17/2017  ? Recurrent nephrolithiasis 09/18/2017  ? Drug dependence ([place]) 08/19/2017  ? Diabetes mellitus type 2 in obese ([place]) 03/28/2017  ?  Hypertension associated with diabetes ([place]) 08/25/2016  ? ? ?Bayan Kushnir,CHRIS, PTA ?10/10/2021, 3:49 PM ? ?[place] ?[facility] ?[place] ?[place], [place], [address] ?Phone: [phone]   Fax:  [phone] ? ?Name: Lamell Bohlander ?MRN: [id number] ?Date of Birth: December 11, 1960 ? ? ? ?

## 2021-10-11 ENCOUNTER — Telehealth: Payer: Self-pay | Admitting: Physician Assistant

## 2021-10-11 ENCOUNTER — Other Ambulatory Visit: Payer: Self-pay | Admitting: Orthopaedic Surgery

## 2021-10-11 ENCOUNTER — Telehealth: Payer: Self-pay | Admitting: Family Medicine

## 2021-10-11 MED ORDER — HYDROCODONE-ACETAMINOPHEN 7.5-325 MG PO TABS
1.0000 | ORAL_TABLET | Freq: Two times a day (BID) | ORAL | 0 refills | Status: DC | PRN
Start: 2021-10-11 — End: 2021-10-26

## 2021-10-11 NOTE — Telephone Encounter (Signed)
Patient aware that we have received 4 boxes of ozempic from his patient assistance.  ?

## 2021-10-11 NOTE — Telephone Encounter (Signed)
Please advise 

## 2021-10-11 NOTE — Telephone Encounter (Signed)
Pt called requesting a refill of hydrocodone. Please send to pharmacy on file. Phone number is [phone]. ?

## 2021-10-12 ENCOUNTER — Telehealth: Payer: Self-pay | Admitting: Orthopaedic Surgery

## 2021-10-12 ENCOUNTER — Ambulatory Visit: Payer: Medicare HMO

## 2021-10-12 DIAGNOSIS — M5441 Lumbago with sciatica, right side: Secondary | ICD-10-CM | POA: Diagnosis not present

## 2021-10-12 DIAGNOSIS — M25551 Pain in right hip: Secondary | ICD-10-CM | POA: Diagnosis not present

## 2021-10-12 DIAGNOSIS — G8929 Other chronic pain: Secondary | ICD-10-CM | POA: Diagnosis not present

## 2021-10-12 NOTE — Telephone Encounter (Signed)
Patient called asked for a call back as soon as possible from Dr. Ninfa Linden.  ?Patient said he want to have his right leg amputated. Patient said he can not sleep at night. Patient said he don't want to continue taking pain medicine. Patient was upset and crying. ?  Patient said he can no longer take this pain.   The number to contact patient is [phone] ?

## 2021-10-12 NOTE — Therapy (Signed)
[place] ?[facility] ?[address] ?[place], [place], [address] ?Phone: [phone]   Fax:  [phone] ? ?Physical Therapy Treatment ? ?Patient Details  ?Name: Alex [name] ?MRN: [id number] ?Date of Birth: 10/03/1960 ?Referring Provider (PT): Allie Bossier MD ? ? ?Encounter Date: 10/12/2021 ? ? PT End of Session - 10/12/21 0915   ? ? Visit Number 7   ? Number of Visits 12   ? Date for PT Re-Evaluation 10/30/21   ? PT Start Time 0911   ? PT Stop Time 1006   ? PT Time Calculation (min) 55 min   ? Activity Tolerance Patient tolerated treatment well;Patient limited by pain   ? Behavior During Therapy [facility] for tasks assessed/performed   ? ?  ?  ? ?  ? ? ?Past Medical History:  ?Diagnosis Date  ? Diabetes mellitus without complication (HCC)   ? History of kidney stones   ? removed 5 months ago  ? Hypertension   ? PONV (postoperative nausea and vomiting)   ? AGITATION after anesthesia as well  ? Sleep apnea   ? NO CPAP  ? ? ?Past Surgical History:  ?Procedure Laterality Date  ? ANTERIOR CERVICAL DECOMP/DISCECTOMY FUSION N/A 03/27/2018  ? Procedure: ANTERIOR CERVICAL DECOMPRESSION/DISCECTOMY FUSION C3-5;  Surgeon: Venita Lick, MD;  Location: [facility];  Service: Orthopedics;  Laterality: N/A;  3.5 hrs  ? arm surgery Right   ? x2 involving tendons/ulnar nerve  ? BACK SURGERY    ? BIOPSY  10/17/2020  ? Procedure: BIOPSY;  Surgeon: Lanelle Bal, DO;  [facility];  Service: Endoscopy;;  ? CARPAL TUNNEL RELEASE Right   ? clavical surgery    ? ESOPHAGOGASTRODUODENOSCOPY (EGD) WITH PROPOFOL N/A 10/17/2020  ? Procedure: ESOPHAGOGASTRODUODENOSCOPY (EGD) WITH PROPOFOL;  Surgeon: Lanelle Bal, DO;  [facility];  Service: Endoscopy;  Laterality: N/A;  PM  ? EYE SURGERY Bilateral   ? glaucoma  ? HERNIA REPAIR    ? JOINT REPLACEMENT Right 06/2011  ? TOTAL KNEE REVISION Right 04/10/2019  ? Procedure: RIGHT TOTAL KNEE REVISION;  Surgeon: Kathryne Hitch, MD;  [facility];  Service: Orthopedics;  Laterality: Right;  ? TRICEPS TENDON REPAIR    ? ? ?There were no vitals filed for this visit. ? ? Subjective Assessment - 10/12/21 0911   ? ? Subjective Patient reports that he was really hurting last night. He reported that he called Dr. Magnus Ivan and told him "that he wants his leg amputated." He had to take some pain medication prior to therapy which has helped some.   ? Pertinent History Right TKA and subsquent revision, DM, ACDF, hernia repair, right arm surgery, right CTR.   ? Diagnostic tests MRI:  Degenerative changes of the lumbar spine, more pronounced at the  level of the facet joints at L4-5 and L5-S1 where there is evidence  of active osteoarthritis.  2. Moderate right and severe left neural foraminal narrowing at  L5-S1.   ? Patient Stated Goals Get out of pain.   ? Currently in Pain? Yes   ? Pain Score 5    ? Pain Location Leg   ? Pain Orientation Right   ? Pain Onset More than a month ago   ? ?  ?  ? ?  ? ? ? ? ? [facility] PT Assessment - 10/12/21 0001   ? ?  ? AROM  ? AROM Assessment Site Hip   ? Right/Left Hip Right;Left   ? Right  Hip Flexion 88   limited by pain  ? Left Hip Flexion 107   ?  ? Strength  ? Strength Assessment Site Hip   ? Right/Left Hip Left;Right   ? Right Hip Extension 3+/5   with knee straight; 3/5 with knee flexed to 90 degrees (reproduced pain into right hip)  ? Left Hip Extension 4-/5   with straight leg; 4/5 with knee flexed to 90 degrees  ?  ? Palpation  ? Palpation comment Severe tenderness to palpation along right TFL, IT band, and hamstring   ? ?  ?  ? ?  ? ? ? ? ? ? ? ? ? ? ? ? ? ? ? ? [facility] Adult PT Treatment/Exercise - 10/12/21 0001   ? ?  ? Lumbar Exercises: Aerobic  ? Nustep L3 x 15 minutes   ?  ? Modalities  ? Modalities Electrical Stimulation   ?  ? Electrical Stimulation  ? Electrical Stimulation Location Right hamstring and lateral hip   no red ness or adverse reaction with this modality  ? Electrical Stimulation Action IFC x 15 minutes    ? Electrical Stimulation Parameters 80-150 Hz w/ 40% scan   ? Electrical Stimulation Goals Pain;Tone   ? ?  ?  ? ?  ? ? ? ? ? ? ? ? ? ? ? ? ? ? ? PT Long Term Goals - 10/05/21 0925   ? ?  ? PT LONG TERM GOAL #1  ? Title Independent with a HEP.   ? Time 6   ? Period Weeks   ? Status Achieved   ?  ? PT LONG TERM GOAL #2  ? Title Eliminate right LE symptoms.   ? Time 6   ? Period Weeks   ? Status On-going   ?  ? PT LONG TERM GOAL #3  ? Title Perform ADL's with pain not > 3/10.   ? Time 6   ? Period Weeks   ? Status On-going   ? ?  ?  ? ?  ? ? ? ? ? ? ? ? Plan - 10/12/21 0916   ? ? Clinical Impression Statement Today's reevaluation was performed to include his right hip into his current POC based on his new referral. His pain was able to be reproduced with palpation to his right IT band and TFL with both of these areas being highly sensitive to palpation. His AROM was also limited due to his familiar pain. However, he reported feeling better after electrical stimulation to his right hamstring and lateral hip at the conclusion of treatment. He continues to require skilled physical therapy to address his remaining impairments to maximize his functional mobility.   ? Personal Factors and Comorbidities Comorbidity 1;Comorbidity 2;Other   ? Comorbidities Right TKA and subsquent revision, DM, ACDF, hernia repair, right arm surgery, right CTR.   ? Examination-Activity Limitations Other;Locomotion Level   ? Stability/Clinical Decision Making Stable/Uncomplicated   ? Rehab Potential Good   ? PT Frequency 2x / week   ? PT Treatment/Interventions ADLs/Self Care Home Management;Cryotherapy;Electrical Stimulation;Ultrasound;Traction;Moist Heat;Therapeutic activities;Therapeutic exercise;Manual techniques;Patient/family education;Passive range of motion;Dry needling   ? PT Next Visit Plan Core exercise progression, modalities and STW/M as needed.   ? Consulted and Agree with Plan of Care Patient   ? ?  ?  ? ?  ? ? ?Patient will  benefit from skilled therapeutic intervention in order to improve the following deficits and impairments:  Decreased activity tolerance, Decreased range of motion,  Decreased strength, Pain ? ?Visit Diagnosis: ?Chronic right-sided low back pain with right-sided sciatica ? ?Pain in right hip ? ? ? ? ?Problem List ?Patient Active Problem List  ? Diagnosis Date Noted  ? Hyperlipidemia associated with type 2 diabetes mellitus (HCC) 06/23/2020  ? Status post revision of total replacement of right knee 04/10/2019  ? Loose right total knee arthroplasty ([facility]) 03/09/2019  ? Neuropathic pain 01/01/2019  ? Cervical spine degeneration 12/30/2018  ? Acquired trigger finger of left middle finger 08/22/2018  ? S/P cervical spinal fusion 03/27/2018  ? Morbid obesity (HCC) 02/21/2018  ? Osteoarthritis of right glenohumeral joint 01/21/2018  ? Tear of right rotator cuff 12/17/2017  ? Recurrent nephrolithiasis 09/18/2017  ? Drug dependence (HCC) 08/19/2017  ? Diabetes mellitus type 2 in obese (HCC) 03/28/2017  ? Hypertension associated with diabetes (HCC) 08/25/2016  ? ? ?Granville Lewis, PT ?10/12/2021, 1:26 PM ? ?[place] ?[facility] ?[address] ?[place], [place], [address] ?Phone: [phone]   Fax:  [phone] ? ?Name: Alex [name] ?MRN: [id number] ?Date of Birth: 12/20/1960 ? ? ? ?

## 2021-10-17 ENCOUNTER — Ambulatory Visit: Payer: Medicare HMO | Admitting: *Deleted

## 2021-10-17 DIAGNOSIS — M5441 Lumbago with sciatica, right side: Secondary | ICD-10-CM | POA: Diagnosis not present

## 2021-10-17 DIAGNOSIS — G8929 Other chronic pain: Secondary | ICD-10-CM

## 2021-10-17 DIAGNOSIS — M25551 Pain in right hip: Secondary | ICD-10-CM | POA: Diagnosis not present

## 2021-10-17 NOTE — Therapy (Signed)
[place] ?[facility] ?[place] ?[place], [place], [address] ?Phone: [phone]   Fax:  [phone] ? ?Physical Therapy Treatment ? ?Patient Details  ?Name: Alex [name] ?MRN: [id number] ?Date of Birth: 1960/07/30 ?Referring Provider (PT): Zollie Beckers MD ? ? ?Encounter Date: 10/17/2021 ? ? PT End of Session - 10/17/21 1011   ? ? Visit Number 8   ? Number of Visits 12   ? Date for PT Re-Evaluation 10/30/21   ? PT Start Time [phone]   ? PT Stop Time [id number]   ? PT Time Calculation (min) 50 min   ? ?  ?  ? ?  ? ? ?Past Medical History:  ?Diagnosis Date  ? Diabetes mellitus without complication ([place])   ? History of kidney stones   ? removed 5 months ago  ? Hypertension   ? PONV (postoperative nausea and vomiting)   ? AGITATION after anesthesia as well  ? Sleep apnea   ? NO CPAP  ? ? ?Past Surgical History:  ?Procedure Laterality Date  ? ANTERIOR CERVICAL DECOMP/DISCECTOMY FUSION N/A 03/27/2018  ? Procedure: ANTERIOR CERVICAL DECOMPRESSION/DISCECTOMY FUSION C3-5;  Surgeon: Melina Schools, MD;  Location: [facility];  Service: Orthopedics;  Laterality: N/A;  3.5 hrs  ? arm surgery Right   ? x2 involving tendons/ulnar nerve  ? BACK SURGERY    ? BIOPSY  10/17/2020  ? Procedure: BIOPSY;  Surgeon: Eloise Harman, DO;  [facility];  Service: Endoscopy;;  ? CARPAL TUNNEL RELEASE Right   ? clavical surgery    ? ESOPHAGOGASTRODUODENOSCOPY (EGD) WITH PROPOFOL N/A 10/17/2020  ? Procedure: ESOPHAGOGASTRODUODENOSCOPY (EGD) WITH PROPOFOL;  Surgeon: Eloise Harman, DO;  [facility];  Service: Endoscopy;  Laterality: N/A;  PM  ? EYE SURGERY Bilateral   ? glaucoma  ? HERNIA REPAIR    ? JOINT REPLACEMENT Right 06/2011  ? TOTAL KNEE REVISION Right 04/10/2019  ? Procedure: RIGHT TOTAL KNEE REVISION;  Surgeon: Mcarthur Rossetti, MD;  [facility];  Service: Orthopedics;  Laterality: Right;  ? TRICEPS TENDON REPAIR    ? ? ?There were no vitals filed for this visit. ? ?  Subjective Assessment - 10/17/21 1010   ? ? Subjective RT hip and LB hurting today   ? Pertinent History Right TKA and subsquent revision, DM, ACDF, hernia repair, right arm surgery, right CTR.   ? Diagnostic tests MRI:  Degenerative changes of the lumbar spine, more pronounced at the  level of the facet joints at L4-5 and L5-S1 where there is evidence  of active osteoarthritis.  2. Moderate right and severe left neural foraminal narrowing at  L5-S1.   ? Patient Stated Goals Get out of pain.   ? Currently in Pain? Yes   ? Pain Score 5    ? Pain Location Leg   ? Pain Orientation Right   ? Pain Descriptors / Indicators Discomfort   ? Pain Type Chronic pain   ? ?  ?  ? ?  ? ? ? ? ? ? ? ? ? ? ? ? ? ? ? ? ? ? ? ? [facility] Adult PT Treatment/Exercise - 10/17/21 0001   ? ?  ? Lumbar Exercises: Aerobic  ? Nustep L4 x 15 minutes   ?  ? Lumbar Exercises: Sidelying  ? Clam Right;15 reps   3x10-15  ? Hip Abduction Right   3x10 but painful  ?  ? Modalities  ? Modalities Electrical Stimulation   ?  ?  Electrical Stimulation  ? Electrical Stimulation Location RT hip   ? Electrical Stimulation Action IFC x 15 mins   ? Electrical Stimulation Parameters 80-150hz    ? Electrical Stimulation Goals Pain;Tone   ?  ? Manual Therapy  ? Manual Therapy Soft tissue mobilization   ? Soft tissue mobilization STM/ IASTM to RT hip region, and ITB in LT sidelying   ? ?  ?  ? ?  ? ? ? ? ? ? ? ? ? ? ? ? ? ? ? PT Long Term Goals - 10/05/21 0925   ? ?  ? PT LONG TERM GOAL #1  ? Title Independent with a HEP.   ? Time 6   ? Period Weeks   ? Status Achieved   ?  ? PT LONG TERM GOAL #2  ? Title Eliminate right LE symptoms.   ? Time 6   ? Period Weeks   ? Status On-going   ?  ? PT LONG TERM GOAL #3  ? Title Perform ADL's with pain not > 3/10.   ? Time 6   ? Period Weeks   ? Status On-going   ? ?  ?  ? ?  ? ? ? ? ? ? ? ? Plan - 10/17/21 1612   ? ? Clinical Impression Statement Pt arrived today still with LBP and hip pain doing about the same. Rx focused on  exercises as well as STW to RT hip and ITB as well as estim for pain control. Pt issued HEP for sidelying clamshell  3x10-15 reps daily   ? Personal Factors and Comorbidities Comorbidity 1;Comorbidity 2;Other   ? Comorbidities Right TKA and subsquent revision, DM, ACDF, hernia repair, right arm surgery, right CTR.   ? Examination-Activity Limitations Other;Locomotion Level   ? Examination-Participation Restrictions Other   ? PT Frequency 2x / week   ? PT Duration 6 weeks   ? PT Treatment/Interventions ADLs/Self Care Home Management;Cryotherapy;Electrical Stimulation;Ultrasound;Traction;Moist Heat;Therapeutic activities;Therapeutic exercise;Manual techniques;Patient/family education;Passive range of motion;Dry needling   ? PT Next Visit Plan Core exercise progression,hip exs modalities and STW/M as needed.   ? Consulted and Agree with Plan of Care Patient   ? ?  ?  ? ?  ? ? ?Patient will benefit from skilled therapeutic intervention in order to improve the following deficits and impairments:  Decreased activity tolerance, Decreased range of motion, Decreased strength, Pain ? ?Visit Diagnosis: ?Chronic right-sided low back pain with right-sided sciatica ? ?Pain in right hip ? ? ? ? ?Problem List ?Patient Active Problem List  ? Diagnosis Date Noted  ? Hyperlipidemia associated with type 2 diabetes mellitus ([place]) 06/23/2020  ? Status post revision of total replacement of right knee 04/10/2019  ? Loose right total knee arthroplasty ([place]) 03/09/2019  ? Neuropathic pain 01/01/2019  ? Cervical spine degeneration 12/30/2018  ? Acquired trigger finger of left middle finger 08/22/2018  ? S/P cervical spinal fusion 03/27/2018  ? Morbid obesity ([place]) 02/21/2018  ? Osteoarthritis of right glenohumeral joint 01/21/2018  ? Tear of right rotator cuff 12/17/2017  ? Recurrent nephrolithiasis 09/18/2017  ? Drug dependence ([place]) 08/19/2017  ? Diabetes mellitus type 2 in obese ([place]) 03/28/2017  ? Hypertension associated with diabetes  ([place]) 08/25/2016  ? ? ?Pascual Mantel,CHRIS, PTA ?10/17/2021, 4:16 PM ? ?[place] ?[facility] ?[place] ?[place], [place], [address] ?Phone: [phone]   Fax:  [phone] ? ?Name: Alex [name] ?MRN: [id number] ?Date of Birth: 02-26-1961 ? ? ? ?

## 2021-10-18 ENCOUNTER — Encounter: Payer: Self-pay | Admitting: Orthopaedic Surgery

## 2021-10-18 ENCOUNTER — Ambulatory Visit (INDEPENDENT_AMBULATORY_CARE_PROVIDER_SITE_OTHER): Payer: Medicare HMO | Admitting: Orthopaedic Surgery

## 2021-10-18 ENCOUNTER — Other Ambulatory Visit: Payer: Self-pay

## 2021-10-18 DIAGNOSIS — M1712 Unilateral primary osteoarthritis, left knee: Secondary | ICD-10-CM | POA: Diagnosis not present

## 2021-10-18 DIAGNOSIS — G8929 Other chronic pain: Secondary | ICD-10-CM | POA: Diagnosis not present

## 2021-10-18 DIAGNOSIS — M5441 Lumbago with sciatica, right side: Secondary | ICD-10-CM

## 2021-10-18 DIAGNOSIS — Z96651 Presence of right artificial knee joint: Secondary | ICD-10-CM | POA: Diagnosis not present

## 2021-10-18 DIAGNOSIS — M7061 Trochanteric bursitis, right hip: Secondary | ICD-10-CM

## 2021-10-18 DIAGNOSIS — R6889 Other general symptoms and signs: Secondary | ICD-10-CM | POA: Diagnosis not present

## 2021-10-18 NOTE — Progress Notes (Signed)
The patient comes in today with continued right lower extremity radicular symptoms.  A trochanteric injection of his right hip did help some.  He does have a revision arthroplasty of the right knee and does wear a knee brace.  He is still complaining of sciatic type of symptoms running down his entire leg.  He has had 1 right-sided L5-S1 injection which helped him significantly.  He is off of any type of chronic narcotic pain medications.  At this point I do feel that he is a candidate for chronic pain management who can provide injections and also other medications that can potentially modify and modulate chronic pain.  He agrees with this referral as well.  In the interim we will released set him up for an L5-S1 right-sided injection again by Dr. Ernestina Patches since that is helped.  Once he has that injection Dr. Ernestina Patches can send him back to me a few weeks later because we can certainly assess his right hip trochanteric area again. ? ?His right hip does move smoothly and fluidly.  He does have a positive straight leg raise on the right side.  There is no instability of his right revision knee arthroplasty on my exam and swelling is minimal. ?

## 2021-10-19 ENCOUNTER — Ambulatory Visit: Payer: Medicare HMO

## 2021-10-19 ENCOUNTER — Telehealth: Payer: Self-pay

## 2021-10-19 NOTE — Telephone Encounter (Signed)
Lm he had something come up today and will not be here today. ?

## 2021-10-19 NOTE — Telephone Encounter (Signed)
Lm he had something come up today and will not be here. ?

## 2021-10-23 ENCOUNTER — Telehealth: Payer: Self-pay | Admitting: Orthopaedic Surgery

## 2021-10-23 NOTE — Telephone Encounter (Signed)
Pt called requesting a call back from Dr. Magnus Ivan or PA Chestine Spore. Pt knee has been clicking every since injection. Please call pt at [phone]. ?

## 2021-10-23 NOTE — Telephone Encounter (Signed)
Please advise 

## 2021-10-24 ENCOUNTER — Ambulatory Visit: Payer: Medicare HMO | Admitting: Physical Therapy

## 2021-10-24 ENCOUNTER — Telehealth: Payer: Self-pay | Admitting: Physical Medicine and Rehabilitation

## 2021-10-24 ENCOUNTER — Encounter: Payer: Self-pay | Admitting: Physical Therapy

## 2021-10-24 DIAGNOSIS — M5441 Lumbago with sciatica, right side: Secondary | ICD-10-CM | POA: Diagnosis not present

## 2021-10-24 DIAGNOSIS — M25551 Pain in right hip: Secondary | ICD-10-CM | POA: Diagnosis not present

## 2021-10-24 DIAGNOSIS — G8929 Other chronic pain: Secondary | ICD-10-CM | POA: Diagnosis not present

## 2021-10-24 NOTE — Therapy (Signed)
[place] ?[facility] ?[address] ?[place], [place], [address] ?Phone: [phone]   Fax:  [phone] ? ?Physical Therapy Treatment ? ?Patient Details  ?Name: Alex [name] ?MRN: [id number] ?Date of Birth: 1960/10/17 ?Referring Provider (PT): Allie Bossier MD ? ? ?Encounter Date: 10/24/2021 ? ? PT End of Session - 10/24/21 0929   ? ? Visit Number 9   ? Number of Visits 12   ? Date for PT Re-Evaluation 10/30/21   ? PT Start Time 0911   ? PT Stop Time 0954   ? PT Time Calculation (min) 43 min   ? Activity Tolerance Patient tolerated treatment well   ? Behavior During Therapy [facility] for tasks assessed/performed   ? ?  ?  ? ?  ? ? ?Past Medical History:  ?Diagnosis Date  ? Diabetes mellitus without complication (HCC)   ? History of kidney stones   ? removed 5 months ago  ? Hypertension   ? PONV (postoperative nausea and vomiting)   ? AGITATION after anesthesia as well  ? Sleep apnea   ? NO CPAP  ? ? ?Past Surgical History:  ?Procedure Laterality Date  ? ANTERIOR CERVICAL DECOMP/DISCECTOMY FUSION N/A 03/27/2018  ? Procedure: ANTERIOR CERVICAL DECOMPRESSION/DISCECTOMY FUSION C3-5;  Surgeon: Venita Lick, MD;  Location: [facility] OR;  Service: Orthopedics;  Laterality: N/A;  3.5 hrs  ? arm surgery Right   ? x2 involving tendons/ulnar nerve  ? BACK SURGERY    ? BIOPSY  10/17/2020  ? Procedure: BIOPSY;  Surgeon: Lanelle Bal, DO;  [facility];  Service: Endoscopy;;  ? CARPAL TUNNEL RELEASE Right   ? clavical surgery    ? ESOPHAGOGASTRODUODENOSCOPY (EGD) WITH PROPOFOL N/A 10/17/2020  ? Procedure: ESOPHAGOGASTRODUODENOSCOPY (EGD) WITH PROPOFOL;  Surgeon: Lanelle Bal, DO;  [facility];  Service: Endoscopy;  Laterality: N/A;  PM  ? EYE SURGERY Bilateral   ? glaucoma  ? HERNIA REPAIR    ? JOINT REPLACEMENT Right 06/2011  ? TOTAL KNEE REVISION Right 04/10/2019  ? Procedure: RIGHT TOTAL KNEE REVISION;  Surgeon: Kathryne Hitch, MD;  Location: [facility];  Service:  Orthopedics;  Laterality: Right;  ? TRICEPS TENDON REPAIR    ? ? ?There were no vitals filed for this visit. ? ? Subjective Assessment - 10/24/21 0908   ? ? Subjective Reports that L knee began clicking yesterday pretty consistently but reports that when clicking starts the numbness begins and his knee wants to buckle. Contacted his ortho office yesterday when it started.   ? Pertinent History Right TKA and subsquent revision, DM, ACDF, hernia repair, right arm surgery, right CTR.   ? Diagnostic tests MRI:  Degenerative changes of the lumbar spine, more pronounced at the  level of the facet joints at L4-5 and L5-S1 where there is evidence  of active osteoarthritis.  2. Moderate right and severe left neural foraminal narrowing at  L5-S1.   ? Patient Stated Goals Get out of pain.   ? Currently in Pain? Yes   ? Pain Location Knee   ? Pain Orientation Left   ? Pain Descriptors / Indicators Discomfort   ? Pain Type Chronic pain   ? Pain Onset More than a month ago   ? Pain Frequency Constant   ? ?  ?  ? ?  ? ? ? ? ? [facility] PT Assessment - 10/24/21 0001   ? ?  ? Assessment  ? Medical Diagnosis Chronic right-sided LBP with sciatica.   ?  Referring Provider (PT) Allie Bossierhris Blackman MD   ? Next MD Visit After LB epidural   ?  ? Precautions  ? Precautions None   ?  ? Restrictions  ? Weight Bearing Restrictions No   ? ?  ?  ? ?  ? ? ? ? ? ? ? ? ? ? ? ? ? ? ? ? [facility] Adult PT Treatment/Exercise - 10/24/21 0001   ? ?  ? Lumbar Exercises: Aerobic  ? Nustep L4 x16 min   no clicking of L knee  ?  ? Lumbar Exercises: Standing  ? Other Standing Lumbar Exercises B hip abduction x15 reps with toe raise   ?  ? Lumbar Exercises: Seated  ? Other Seated Lumbar Exercises B hip clam red theraband x20 reps   ?  ? Lumbar Exercises: Supine  ? Straight Leg Raise 15 reps   ? Straight Leg Raises Limitations RLE   ?  ? Modalities  ? Modalities Electrical Stimulation   ?  ? Electrical Stimulation  ? Electrical Stimulation Location RT hip   ? Electrical  Stimulation Action Pre-Mod   ? Electrical Stimulation Parameters 80-150 hz x10 min   ? Electrical Stimulation Goals Pain   ? ?  ?  ? ?  ? ? ? ? ? ? ? ? ? ? ? ? ? ? ? PT Long Term Goals - 10/05/21 0925   ? ?  ? PT LONG TERM GOAL #1  ? Title Independent with a HEP.   ? Time 6   ? Period Weeks   ? Status Achieved   ?  ? PT LONG TERM GOAL #2  ? Title Eliminate right LE symptoms.   ? Time 6   ? Period Weeks   ? Status On-going   ?  ? PT LONG TERM GOAL #3  ? Title Perform ADL's with pain not > 3/10.   ? Time 6   ? Period Weeks   ? Status On-going   ? ?  ?  ? ?  ? ? ? ? ? ? ? ? Plan - 10/24/21 0947   ? ? Clinical Impression Statement Patient presented in clinic with reports of greater L knee pain which he is in contact with Ortho for. Patient progressed gentle hip/knee strengthening with no complaints of clicking of L knee during the exercises. Emphasis on quad activation instructed throughout. Normal stimulation response noted following removal of the modality.   ? Personal Factors and Comorbidities Comorbidity 1;Comorbidity 2;Other   ? Comorbidities Right TKA and subsquent revision, DM, ACDF, hernia repair, right arm surgery, right CTR.   ? Examination-Activity Limitations Other;Locomotion Level   ? Examination-Participation Restrictions Other   ? Stability/Clinical Decision Making Stable/Uncomplicated   ? Rehab Potential Good   ? PT Frequency 2x / week   ? PT Duration 6 weeks   ? PT Treatment/Interventions ADLs/Self Care Home Management;Cryotherapy;Electrical Stimulation;Ultrasound;Traction;Moist Heat;Therapeutic activities;Therapeutic exercise;Manual techniques;Patient/family education;Passive range of motion;Dry needling   ? PT Next Visit Plan Core exercise progression,hip exs modalities and STW/M as needed.   ? Consulted and Agree with Plan of Care Patient   ? ?  ?  ? ?  ? ? ?Patient will benefit from skilled therapeutic intervention in order to improve the following deficits and impairments:  Decreased activity  tolerance, Decreased range of motion, Decreased strength, Pain ? ?Visit Diagnosis: ?Chronic right-sided low back pain with right-sided sciatica ? ?Pain in right hip ? ? ? ? ?Problem List ?Patient Active Problem List  ?  Diagnosis Date Noted  ? Hyperlipidemia associated with type 2 diabetes mellitus (HCC) 06/23/2020  ? Status post revision of total replacement of right knee 04/10/2019  ? Loose right total knee arthroplasty (HCC) 03/09/2019  ? Neuropathic pain 01/01/2019  ? Cervical spine degeneration 12/30/2018  ? Acquired trigger finger of left middle finger 08/22/2018  ? S/P cervical spinal fusion 03/27/2018  ? Morbid obesity (HCC) 02/21/2018  ? Osteoarthritis of right glenohumeral joint 01/21/2018  ? Tear of right rotator cuff 12/17/2017  ? Recurrent nephrolithiasis 09/18/2017  ? Drug dependence (HCC) 08/19/2017  ? Diabetes mellitus type 2 in obese (HCC) 03/28/2017  ? Hypertension associated with diabetes (HCC) 08/25/2016  ? ? ?Marvell Fuller, PTA ?10/24/2021, 9:58 AM ? ? ?[facility] ?[address] ?[place], [place], [address] ?Phone: [phone]   Fax:  [phone] ? ?Name: Alex [name] ?MRN: [id number] ?Date of Birth: [birth year] ? ? ? ?

## 2021-10-24 NOTE — Telephone Encounter (Signed)
Patient called concerning his appointment with Dr. Alvester Morin asked for a call back as soon as possible.  The number to contact patient is [phone] ?

## 2021-10-25 ENCOUNTER — Telehealth: Payer: Self-pay | Admitting: Physical Medicine and Rehabilitation

## 2021-10-25 NOTE — Telephone Encounter (Signed)
Pt called requesting a call back to set an appt. Please call pt at [phone]. ?

## 2021-10-26 ENCOUNTER — Encounter: Payer: Medicare HMO | Admitting: *Deleted

## 2021-10-26 ENCOUNTER — Telehealth: Payer: Self-pay | Admitting: Family Medicine

## 2021-10-26 ENCOUNTER — Other Ambulatory Visit: Payer: Self-pay | Admitting: Orthopaedic Surgery

## 2021-10-26 ENCOUNTER — Telehealth: Payer: Self-pay | Admitting: Orthopaedic Surgery

## 2021-10-26 DIAGNOSIS — G8929 Other chronic pain: Secondary | ICD-10-CM

## 2021-10-26 MED ORDER — HYDROCODONE-ACETAMINOPHEN 7.5-325 MG PO TABS: 1.0000 | ORAL_TABLET | Freq: Two times a day (BID) | ORAL | 0 refills | Status: DC | PRN

## 2021-10-26 NOTE — Telephone Encounter (Signed)
MRI ordered. Pt was called and advised and stated understanding  ?

## 2021-10-26 NOTE — Telephone Encounter (Signed)
Reviewed positive HSV I with pt. He has no further concerns. ?

## 2021-10-26 NOTE — Telephone Encounter (Signed)
Please advise 

## 2021-10-26 NOTE — Telephone Encounter (Signed)
Patient called needing Rx refilled hydrocodone. Patient said his Lt knee gave out on him again. Patient said his appointment with Dr. Alvester Morin is 11/23/2021. Patient asked if he can be set up for an MRI? The number to contact patient is [phone]. ?

## 2021-11-03 ENCOUNTER — Ambulatory Visit: Payer: Medicare HMO | Admitting: *Deleted

## 2021-11-03 DIAGNOSIS — G8929 Other chronic pain: Secondary | ICD-10-CM

## 2021-11-03 DIAGNOSIS — M25551 Pain in right hip: Secondary | ICD-10-CM

## 2021-11-03 DIAGNOSIS — M5441 Lumbago with sciatica, right side: Secondary | ICD-10-CM | POA: Diagnosis not present

## 2021-11-03 NOTE — Therapy (Signed)
[place] ?[facility] ?[address] ?[place], [place], [address] ?Phone: [phone]   Fax:  [phone] ? ?Physical Therapy Treatment ? ?Patient Details  ?Name: Alex [name] ?MRN: [id number] ?Date of Birth: 11-09-1960 ?Referring Provider (PT): Zollie Beckers MD ? ? ?Encounter Date: 11/03/2021 ? ? PT End of Session - 11/03/21 0910   ? ? Visit Number 10   ? Number of Visits 12   ? Date for PT Re-Evaluation 10/30/21   ? PT Start Time [phone]   ? PT Stop Time 1000   ? PT Time Calculation (min) 51 min   ? ?  ?  ? ?  ? ? ?Past Medical History:  ?Diagnosis Date  ? Diabetes mellitus without complication ([place])   ? History of kidney stones   ? removed 5 months ago  ? Hypertension   ? PONV (postoperative nausea and vomiting)   ? AGITATION after anesthesia as well  ? Sleep apnea   ? NO CPAP  ? ? ?Past Surgical History:  ?Procedure Laterality Date  ? ANTERIOR CERVICAL DECOMP/DISCECTOMY FUSION N/A 03/27/2018  ? Procedure: ANTERIOR CERVICAL DECOMPRESSION/DISCECTOMY FUSION C3-5;  Surgeon: Melina Schools, MD;  Location: [place];  Service: Orthopedics;  Laterality: N/A;  3.5 hrs  ? arm surgery Right   ? x2 involving tendons/ulnar nerve  ? BACK SURGERY    ? BIOPSY  10/17/2020  ? Procedure: BIOPSY;  Surgeon: Eloise Harman, DO;  [facility];  Service: Endoscopy;;  ? CARPAL TUNNEL RELEASE Right   ? clavical surgery    ? ESOPHAGOGASTRODUODENOSCOPY (EGD) WITH PROPOFOL N/A 10/17/2020  ? Procedure: ESOPHAGOGASTRODUODENOSCOPY (EGD) WITH PROPOFOL;  Surgeon: Eloise Harman, DO;  [facility];  Service: Endoscopy;  Laterality: N/A;  PM  ? EYE SURGERY Bilateral   ? glaucoma  ? HERNIA REPAIR    ? JOINT REPLACEMENT Right 06/2011  ? TOTAL KNEE REVISION Right 04/10/2019  ? Procedure: RIGHT TOTAL KNEE REVISION;  Surgeon: Mcarthur Rossetti, MD;  [facility];  Service: Orthopedics;  Laterality: Right;  ? TRICEPS TENDON REPAIR    ? ? ?There were no vitals filed for this visit. ? ?  Subjective Assessment - 11/03/21 0916   ? ? Subjective LB and RT hip are hurting and have an MRI appt for may 10th for LT knee.   ? Pertinent History Right TKA and subsquent revision, DM, ACDF, hernia repair, right arm surgery, right CTR.   ? Diagnostic tests MRI:  Degenerative changes of the lumbar spine, more pronounced at the  level of the facet joints at L4-5 and L5-S1 where there is evidence  of active osteoarthritis.  2. Moderate right and severe left neural foraminal narrowing at  L5-S1.   ? Patient Stated Goals Get out of pain.   ? Currently in Pain? Yes   ? Pain Score 6    ? Pain Location Knee   ? Pain Orientation Left   ? Pain Descriptors / Indicators Discomfort   ? Pain Type Chronic pain   ? Pain Onset More than a month ago   ? ?  ?  ? ?  ? ? ? ? ? ? ? ? ? ? ? ? ? ? ? ? ? ? ? ? [place] Adult PT Treatment/Exercise - 11/03/21 0001   ? ?  ? Lumbar Exercises: Aerobic  ? Nustep L4 C58 min   no clicking of L knee  ?  ? Lumbar Exercises: Standing  ? Other Standing Lumbar Exercises B  hip abduction 2x10reps with 3 sec holds   ?  ? Lumbar Exercises: Seated  ? Other Seated Lumbar Exercises B hip clam red theraband 3x 10 for 5 sec holds reps   ?  ? Lumbar Exercises: Supine  ? Other Supine Lumbar Exercises Discussed and reviewed HEP   ?  ? Modalities  ? Modalities Electrical Stimulation;Moist Heat   ?  ? Moist Heat Therapy  ? Number Minutes Moist Heat 15 Minutes   ? Moist Heat Location Lumbar Spine   ?  ? Electrical Stimulation  ? Electrical Stimulation Location RT hip   ? Electrical Stimulation Action Premod   ? Electrical Stimulation Parameters 80-_0  x 15 mins   ? Electrical Stimulation Goals Pain   ? ?  ?  ? ?  ? ? ? ? ? ? ? ? ? ? ? ? ? ? ? PT Long Term Goals - 11/03/21 0937   ? ?  ? PT LONG TERM GOAL #1  ? Title Independent with a HEP.   ? Time 6   ? Period Weeks   ? Status Achieved   ?  ? PT LONG TERM GOAL #2  ? Title Eliminate right LE symptoms.   ? Time 6   ? Period Weeks   ? Status Partially Met   ?  ? PT LONG  TERM GOAL #3  ? Title Perform ADL's with pain not > 3/10.   ? Time 6   ? Period Weeks   ? Status On-going   ? ?  ?  ? ?  ? ? ? ? ? ? ? ? Plan - 11/03/21 0928   ? ? Clinical Impression Statement Pt arrived today doing fair with RT hip and back. Rx focused on therex for strengthening and pain management. Pt reports able to cross his legs now to put his socks on with less hip pain. Pt progressing towards LTGs, but has several compensations due to LT knee pain as well. Recert for more visits and date extension   ? Personal Factors and Comorbidities Comorbidity 1;Comorbidity 2;Other   ? Comorbidities Right TKA and subsquent revision, DM, ACDF, hernia repair, right arm surgery, right CTR.   ? Examination-Activity Limitations Other;Locomotion Level   ? Examination-Participation Restrictions Other   ? Stability/Clinical Decision Making Stable/Uncomplicated   ? Rehab Potential Good   ? PT Frequency 2x / week   ? PT Duration 6 weeks   ? PT Treatment/Interventions ADLs/Self Care Home Management;Cryotherapy;Electrical Stimulation;Ultrasound;Traction;Moist Heat;Therapeutic activities;Therapeutic exercise;Manual techniques;Patient/family education;Passive range of motion;Dry needling   ? PT Next Visit Plan Core exercise progression,hip exs modalities and STW/M as needed. Recert   ? Consulted and Agree with Plan of Care Patient   ? ?  ?  ? ?  ? ? ?Patient will benefit from skilled therapeutic intervention in order to improve the following deficits and impairments:  Decreased activity tolerance, Decreased range of motion, Decreased strength, Pain ? ?Visit Diagnosis: ?Chronic right-sided low back pain with right-sided sciatica ? ?Pain in right hip ? ? ? ? ?Problem List ?Patient Active Problem List  ? Diagnosis Date Noted  ? Hyperlipidemia associated with type 2 diabetes mellitus ([place]) 06/23/2020  ? Status post revision of total replacement of right knee 04/10/2019  ? Loose right total knee arthroplasty ([place]) 03/09/2019  ?  Neuropathic pain 01/01/2019  ? Cervical spine degeneration 12/30/2018  ? Acquired trigger finger of left middle finger 08/22/2018  ? S/P cervical spinal fusion 03/27/2018  ? Morbid obesity ([place]) 02/21/2018  ?  Osteoarthritis of right glenohumeral joint 01/21/2018  ? Tear of right rotator cuff 12/17/2017  ? Recurrent nephrolithiasis 09/18/2017  ? Drug dependence ([place]) 08/19/2017  ? Diabetes mellitus type 2 in obese ([place]) 03/28/2017  ? Hypertension associated with diabetes ([place]) 08/25/2016  ? ? ?Nalaya Wojdyla,CHRIS, PTA ?11/03/2021, 11:18 AM ? ?[place] ?[facility] ?[address] ?[place], [place], [address] ?Phone: [phone]   Fax:  [phone] ? ?Name: Alex [name] ?MRN: [id number] ?Date of Birth: Apr 10, 1961 ? ? ? ?

## 2021-11-07 ENCOUNTER — Ambulatory Visit: Payer: Medicare HMO | Attending: Orthopaedic Surgery

## 2021-11-07 DIAGNOSIS — M5441 Lumbago with sciatica, right side: Secondary | ICD-10-CM | POA: Insufficient documentation

## 2021-11-07 DIAGNOSIS — G8929 Other chronic pain: Secondary | ICD-10-CM | POA: Diagnosis not present

## 2021-11-07 DIAGNOSIS — M25551 Pain in right hip: Secondary | ICD-10-CM | POA: Diagnosis not present

## 2021-11-07 NOTE — Therapy (Signed)
[place] ?[facility] ?[address] ?[place], [place], [address] ?Phone: [phone]   Fax:  [phone] ? ?Physical Therapy Treatment ? ?Patient Details  ?Name: Alex [name] ?MRN: [id number] ?Date of Birth: 06/23/1961 ?Referring Provider (PT): Zollie Beckers MD ? ? ?Encounter Date: 11/07/2021 ? ? PT End of Session - 11/07/21 3704   ? ? Visit Number 11   ? Number of Visits 18   ? Date for PT Re-Evaluation 11/20/21   ? PT Start Time 0908   ? PT Stop Time 8889   ? PT Time Calculation (min) 49 min   ? Activity Tolerance Patient tolerated treatment well   ? Behavior During Therapy [facility] for tasks assessed/performed   ? ?  ?  ? ?  ? ? ?Past Medical History:  ?Diagnosis Date  ? Diabetes mellitus without complication ([place])   ? History of kidney stones   ? removed 5 months ago  ? Hypertension   ? PONV (postoperative nausea and vomiting)   ? AGITATION after anesthesia as well  ? Sleep apnea   ? NO CPAP  ? ? ?Past Surgical History:  ?Procedure Laterality Date  ? ANTERIOR CERVICAL DECOMP/DISCECTOMY FUSION N/A 03/27/2018  ? Procedure: ANTERIOR CERVICAL DECOMPRESSION/DISCECTOMY FUSION C3-5;  Surgeon: Melina Schools, MD;  Location: [place];  Service: Orthopedics;  Laterality: N/A;  3.5 hrs  ? arm surgery Right   ? x2 involving tendons/ulnar nerve  ? BACK SURGERY    ? BIOPSY  10/17/2020  ? Procedure: BIOPSY;  Surgeon: Eloise Harman, DO;  [facility];  Service: Endoscopy;;  ? CARPAL TUNNEL RELEASE Right   ? clavical surgery    ? ESOPHAGOGASTRODUODENOSCOPY (EGD) WITH PROPOFOL N/A 10/17/2020  ? Procedure: ESOPHAGOGASTRODUODENOSCOPY (EGD) WITH PROPOFOL;  Surgeon: Eloise Harman, DO;  [facility];  Service: Endoscopy;  Laterality: N/A;  PM  ? EYE SURGERY Bilateral   ? glaucoma  ? HERNIA REPAIR    ? JOINT REPLACEMENT Right 06/2011  ? TOTAL KNEE REVISION Right 04/10/2019  ? Procedure: RIGHT TOTAL KNEE REVISION;  Surgeon: Mcarthur Rossetti, MD;  [facility];  Service:  Orthopedics;  Laterality: Right;  ? TRICEPS TENDON REPAIR    ? ? ?There were no vitals filed for this visit. ? ? Subjective Assessment - 11/07/21 0919   ? ? Subjective Patient reports that his left side is still hurting today. He had to take his pain medication this morning to bring his pain down some.   ? Pertinent History Right TKA and subsquent revision, DM, ACDF, hernia repair, right arm surgery, right CTR.   ? Diagnostic tests MRI:  Degenerative changes of the lumbar spine, more pronounced at the  level of the facet joints at L4-5 and L5-S1 where there is evidence  of active osteoarthritis.  2. Moderate right and severe left neural foraminal narrowing at  L5-S1.   ? Patient Stated Goals Get out of pain.   ? Currently in Pain? Yes   ? Pain Score 7    ? Pain Onset More than a month ago   ? ?  ?  ? ?  ? ? ? ? ? ? ? ? ? ? ? ? ? ? ? ? ? ? ? ? [place] Adult PT Treatment/Exercise - 11/07/21 0001   ? ?  ? Lumbar Exercises: Aerobic  ? Nustep L4 x 15 minutes   ?  ? Lumbar Exercises: Standing  ? Other Standing Lumbar Exercises Side stepping   2 minutes  ?  Other Standing Lumbar Exercises Hip extension   20 reps each; BLE:  ?  ? Lumbar Exercises: Seated  ? Long [employer] on Chair Both;20 reps   limited by pain  ?  ? Modalities  ? Modalities Electrical Stimulation;Moist Heat   ?  ? Moist Heat Therapy  ? Number Minutes Moist Heat 15 Minutes   ? Moist Heat Location Lumbar Spine   ?  ? Electrical Stimulation  ? Electrical Stimulation Location right hamstring and left low back   no redness or adverse reaction to today's interventions  ? Electrical Stimulation Action 2 channel pre mod   ? Electrical Stimulation Parameters 80-150 Hz x 15 minutes   ? Electrical Stimulation Goals Pain   ? ?  ?  ? ?  ? ? ? ? ? ? ? ? ? ? ? ? ? ? ? PT Long Term Goals - 11/03/21 0937   ? ?  ? PT LONG TERM GOAL #1  ? Title Independent with a HEP.   ? Time 6   ? Period Weeks   ? Status Achieved   ?  ? PT LONG TERM GOAL #2  ? Title Eliminate right LE symptoms.    ? Time 6   ? Period Weeks   ? Status Partially Met   ?  ? PT LONG TERM GOAL #3  ? Title Perform ADL's with pain not > 3/10.   ? Time 6   ? Period Weeks   ? Status On-going   ? ?  ?  ? ?  ? ? ? ? ? ? ? ? Plan - 11/07/21 0921   ? ? Clinical Impression Statement Patient was introduced to standing hip extension in addition to familiar interventions. He required minimal cueing with today's standing interventions for appropriate step length to prevent an increase in his familiar symptoms. Electrical stimulation and moist heat were the most effective at reducing his familair symptoms. He reported feeling "a little bit better" upon the conclusion of treatment. He continues to require skilled physical therapy to address his remaining impairments to maximize his functional mobiltiy.   ? Personal Factors and Comorbidities Comorbidity 1;Comorbidity 2;Other   ? Comorbidities Right TKA and subsquent revision, DM, ACDF, hernia repair, right arm surgery, right CTR.   ? Examination-Activity Limitations Other;Locomotion Level   ? Examination-Participation Restrictions Other   ? Stability/Clinical Decision Making Stable/Uncomplicated   ? Rehab Potential Good   ? PT Frequency 2x / week   ? PT Duration 6 weeks   ? PT Treatment/Interventions ADLs/Self Care Home Management;Cryotherapy;Electrical Stimulation;Ultrasound;Traction;Moist Heat;Therapeutic activities;Therapeutic exercise;Manual techniques;Patient/family education;Passive range of motion;Dry needling   ? PT Next Visit Plan Core exercise progression,hip exs modalities and STW/M as needed.   ? Consulted and Agree with Plan of Care Patient   ? ?  ?  ? ?  ? ? ?Patient will benefit from skilled therapeutic intervention in order to improve the following deficits and impairments:  Decreased activity tolerance, Decreased range of motion, Decreased strength, Pain ? ?Visit Diagnosis: ?Chronic right-sided low back pain with right-sided sciatica ? ?Pain in right hip ? ? ? ? ?Problem  List ?Patient Active Problem List  ? Diagnosis Date Noted  ? Hyperlipidemia associated with type 2 diabetes mellitus ([place]) 06/23/2020  ? Status post revision of total replacement of right knee 04/10/2019  ? Loose right total knee arthroplasty ([place]) 03/09/2019  ? Neuropathic pain 01/01/2019  ? Cervical spine degeneration 12/30/2018  ? Acquired trigger finger of left middle finger 08/22/2018  ?  S/P cervical spinal fusion 03/27/2018  ? Morbid obesity ([place]) 02/21/2018  ? Osteoarthritis of right glenohumeral joint 01/21/2018  ? Tear of right rotator cuff 12/17/2017  ? Recurrent nephrolithiasis 09/18/2017  ? Drug dependence ([place]) 08/19/2017  ? Diabetes mellitus type 2 in obese ([place]) 03/28/2017  ? Hypertension associated with diabetes ([place]) 08/25/2016  ? ? ?Darlin Coco, PT ?11/07/2021, 4:40 PM ? ?[place] ?[facility] ?[address] ?[place], [place], [address] ?Phone: [phone]   Fax:  [phone] ? ?Name: Hurley Sobel ?MRN: [id number] ?Date of Birth: 21-Jun-1961 ? ? ? ?

## 2021-11-09 ENCOUNTER — Ambulatory Visit: Payer: Medicare HMO

## 2021-11-09 ENCOUNTER — Telehealth: Payer: Self-pay | Admitting: Pharmacist

## 2021-11-09 DIAGNOSIS — M25551 Pain in right hip: Secondary | ICD-10-CM

## 2021-11-09 DIAGNOSIS — G8929 Other chronic pain: Secondary | ICD-10-CM | POA: Diagnosis not present

## 2021-11-09 DIAGNOSIS — M5441 Lumbago with sciatica, right side: Secondary | ICD-10-CM | POA: Diagnosis not present

## 2021-11-09 DIAGNOSIS — E669 Obesity, unspecified: Secondary | ICD-10-CM

## 2021-11-09 MED ORDER — SEMAGLUTIDE (2 MG/DOSE) 8 MG/3ML ~~LOC~~ SOPN: 2.0000 mg | PEN_INJECTOR | SUBCUTANEOUS | 5 refills | Status: DC

## 2021-11-09 NOTE — Telephone Encounter (Signed)
Ozempic patient assistance supply here for pick up ?4 month supply ?Ozempic 2mg  -- reviewed new dosing ?

## 2021-11-09 NOTE — Therapy (Signed)
[place] [facility] ?[address] ?[place], [place], [address] ?Phone: [phone]   Fax:  [phone] ? ?Physical Therapy Treatment ? ?Patient Details  ?Name: Alex [name] ?MRN: [id number] ?Date of Birth: [birth year] ?Referring Provider (PT): Zollie Beckers MD ? ? ?Encounter Date: 11/09/2021 ? ? PT End of Session - 11/09/21 0914   ? ? Visit Number 12   ? Number of Visits 18   ? Date for PT Re-Evaluation 11/20/21   ? PT Start Time [phone]   ? PT Stop Time [phone]   ? PT Time Calculation (min) 47 min   ? Activity Tolerance Patient tolerated treatment well   ? Behavior During Therapy [facility] for tasks assessed/performed   ? ?  ?  ? ?  ? ? ?Past Medical History:  ?Diagnosis Date  ? Diabetes mellitus without complication ([place])   ? History of kidney stones   ? removed 5 months ago  ? Hypertension   ? PONV (postoperative nausea and vomiting)   ? AGITATION after anesthesia as well  ? Sleep apnea   ? NO CPAP  ? ? ?Past Surgical History:  ?Procedure Laterality Date  ? ANTERIOR CERVICAL DECOMP/DISCECTOMY FUSION N/A 03/27/2018  ? Procedure: ANTERIOR CERVICAL DECOMPRESSION/DISCECTOMY FUSION C3-5;  Surgeon: Melina Schools, MD;  Location: [facility];  Service: Orthopedics;  Laterality: N/A;  3.5 hrs  ? arm surgery Right   ? x2 involving tendons/ulnar nerve  ? BACK SURGERY    ? BIOPSY  10/17/2020  ? Procedure: BIOPSY;  Surgeon: Eloise Harman, DO;  [facility];  Service: Endoscopy;;  ? CARPAL TUNNEL RELEASE Right   ? clavical surgery    ? ESOPHAGOGASTRODUODENOSCOPY (EGD) WITH PROPOFOL N/A 10/17/2020  ? Procedure: ESOPHAGOGASTRODUODENOSCOPY (EGD) WITH PROPOFOL;  Surgeon: Eloise Harman, DO;  [facility];  Service: Endoscopy;  Laterality: N/A;  PM  ? EYE SURGERY Bilateral   ? glaucoma  ? HERNIA REPAIR    ? JOINT REPLACEMENT Right 06/2011  ? TOTAL KNEE REVISION Right 04/10/2019  ? Procedure: RIGHT TOTAL KNEE REVISION;  Surgeon: Mcarthur Rossetti, MD;  [facility];  Service:  Orthopedics;  Laterality: Right;  ? TRICEPS TENDON REPAIR    ? ? ?There were no vitals filed for this visit. ? ? Subjective Assessment - 11/09/21 0913   ? ? Subjective Patient arrives for today's treatment session 8 mins late reporting 7/10 low back pain.   ? Pertinent History Right TKA and subsquent revision, DM, ACDF, hernia repair, right arm surgery, right CTR.   ? Diagnostic tests MRI:  Degenerative changes of the lumbar spine, more pronounced at the  level of the facet joints at L4-5 and L5-S1 where there is evidence  of active osteoarthritis.  2. Moderate right and severe left neural foraminal narrowing at  L5-S1.   ? Patient Stated Goals Get out of pain.   ? Currently in Pain? Yes   ? Pain Score 7    ? Pain Location Back   ? Pain Onset More than a month ago   ? ?  ?  ? ?  ? ? ? ? ? ? ? ? ? ? ? ? ? ? ? ? ? ? ? ? [facility] PT Treatment/Exercise - 11/09/21 0001   ? ?  ? Lumbar Exercises: Aerobic  ? Nustep Lvl 4 x 15 mins   ?  ? Lumbar Exercises: Supine  ? Straight Leg Raise 15 reps   ? Straight Leg Raises Limitations RLE   ?  ?  Lumbar Exercises: Sidelying  ? Clam Right;20 reps   ? Hip Abduction Right;20 reps   ?  ? Modalities  ? Modalities Electrical Stimulation;Moist Heat   ?  ? Moist Heat Therapy  ? Number Minutes Moist Heat 15 Minutes   ? Moist Heat Location Hip   ?  ? Electrical Stimulation  ? Electrical Stimulation Location Right hip   ? Electrical Stimulation Action IFC   ? Electrical Stimulation Parameters 80-150 Hz x 15 mins   ? Electrical Stimulation Goals Pain   ? ?  ?  ? ?  ? ? ? ? ? ? ? ? ? ? ? ? ? ? ? PT Long Term Goals - 11/03/21 0937   ? ?  ? PT LONG TERM GOAL #1  ? Title Independent with a HEP.   ? Time 6   ? Period Weeks   ? Status Achieved   ?  ? PT LONG TERM GOAL #2  ? Title Eliminate right LE symptoms.   ? Time 6   ? Period Weeks   ? Status Partially Met   ?  ? PT LONG TERM GOAL #3  ? Title Perform ADL's with pain not > 3/10.   ? Time 6   ? Period Weeks   ? Status On-going   ? ?  ?  ? ?   ? ? ? ? ? ? ? ? Plan - 11/09/21 0915   ? ? Clinical Impression Statement Pt arrives for today's treatment session reporting 7/10 left low back pain.  Pt arrives for today's treatment session 8 mins late.  Pt able to tolerate minimal exercises today due to pain and discomfort.  Pt requesting estim and moist heat as it is most effective in reducing his pain.  Pt reports slight improvement in pain at completion of today's treatment session.   ? Personal Factors and Comorbidities Comorbidity 1;Comorbidity 2;Other   ? Comorbidities Right TKA and subsquent revision, DM, ACDF, hernia repair, right arm surgery, right CTR.   ? Examination-Activity Limitations Other;Locomotion Level   ? Examination-Participation Restrictions Other   ? Stability/Clinical Decision Making Stable/Uncomplicated   ? Rehab Potential Good   ? PT Frequency 2x / week   ? PT Duration 6 weeks   ? PT Treatment/Interventions ADLs/Self Care Home Management;Cryotherapy;Electrical Stimulation;Ultrasound;Traction;Moist Heat;Therapeutic activities;Therapeutic exercise;Manual techniques;Patient/family education;Passive range of motion;Dry needling   ? PT Next Visit Plan Core exercise progression,hip exs modalities and STW/M as needed.   ? Consulted and Agree with Plan of Care Patient   ? ?  ?  ? ?  ? ? ?Patient will benefit from skilled therapeutic intervention in order to improve the following deficits and impairments:  Decreased activity tolerance, Decreased range of motion, Decreased strength, Pain ? ?Visit Diagnosis: ?Chronic right-sided low back pain with right-sided sciatica ? ?Pain in right hip ? ? ? ? ?Problem List ?Patient Active Problem List  ? Diagnosis Date Noted  ? Hyperlipidemia associated with type 2 diabetes mellitus ([place]) 06/23/2020  ? Status post revision of total replacement of right knee 04/10/2019  ? Loose right total knee arthroplasty ([place]) 03/09/2019  ? Neuropathic pain 01/01/2019  ? Cervical spine degeneration 12/30/2018  ? Acquired  trigger finger of left middle finger 08/22/2018  ? S/P cervical spinal fusion 03/27/2018  ? Morbid obesity ([place]) 02/21/2018  ? Osteoarthritis of right glenohumeral joint 01/21/2018  ? Tear of right rotator cuff 12/17/2017  ? Recurrent nephrolithiasis 09/18/2017  ? Drug dependence ([place]) 08/19/2017  ? Diabetes mellitus type  2 in obese ([place]) 03/28/2017  ? Hypertension associated with diabetes ([place]) 08/25/2016  ? ? ?Kathrynn Ducking, PTA ?11/09/2021, 9:56 AM ? ?[place] [facility] ?[address] ?[place], [place], [address] ?Phone: [phone]   Fax:  [phone] ? ?Name: Alex [name] ?MRN: [id number] ?Date of Birth: 11-01-1960 ? ? ? ?

## 2021-11-10 ENCOUNTER — Telehealth: Payer: Self-pay | Admitting: Family Medicine

## 2021-11-10 ENCOUNTER — Telehealth: Payer: Self-pay | Admitting: Orthopaedic Surgery

## 2021-11-10 ENCOUNTER — Other Ambulatory Visit: Payer: Self-pay | Admitting: Orthopaedic Surgery

## 2021-11-10 MED ORDER — HYDROCODONE-ACETAMINOPHEN 7.5-325 MG PO TABS
1.0000 | ORAL_TABLET | Freq: Two times a day (BID) | ORAL | 0 refills | Status: DC | PRN
Start: 2021-11-10 — End: 2021-11-23

## 2021-11-10 NOTE — Telephone Encounter (Signed)
Called [phone] x3 no answer & says not in service ?We were increasing patient to ozempic 2mg  for additional glycemic control and weight loss ?Please try to reach out to patient  ?

## 2021-11-10 NOTE — Telephone Encounter (Signed)
Patient called needing Rx refilled Hydrocodone and Valium. The number to contact patient is [phone] ?

## 2021-11-10 NOTE — Telephone Encounter (Signed)
Patient aware.

## 2021-11-10 NOTE — Telephone Encounter (Signed)
I called patient and advised. 

## 2021-11-10 NOTE — Telephone Encounter (Signed)
Please advise 

## 2021-11-10 NOTE — Telephone Encounter (Signed)
VM not set up.

## 2021-11-10 NOTE — Telephone Encounter (Signed)
Patient assistance per note from yesterday. ?Please review and advise  ?

## 2021-11-10 NOTE — Telephone Encounter (Signed)
Patient was given 2 mg Ozempic samples and was taking 1 mg before so he wants to make sure which one he is supposed to be taking. Please call back.  ?

## 2021-11-15 ENCOUNTER — Ambulatory Visit (HOSPITAL_COMMUNITY)
Admission: RE | Admit: 2021-11-15 | Discharge: 2021-11-15 | Disposition: A | Discharge status: Home / Self Care | Payer: Medicare HMO | Source: Ambulatory Visit | Attending: Orthopaedic Surgery | Admitting: Orthopaedic Surgery

## 2021-11-15 DIAGNOSIS — M25562 Pain in left knee: Secondary | ICD-10-CM | POA: Diagnosis not present

## 2021-11-15 DIAGNOSIS — G8929 Other chronic pain: Secondary | ICD-10-CM | POA: Diagnosis not present

## 2021-11-17 ENCOUNTER — Encounter: Payer: Medicare HMO | Attending: Physical Medicine & Rehabilitation | Admitting: Physical Medicine & Rehabilitation

## 2021-11-17 ENCOUNTER — Encounter: Payer: Self-pay | Admitting: Physical Medicine & Rehabilitation

## 2021-11-17 VITALS — BP 142/86 | HR 81 | Ht 66.0 in | Wt 209.0 lb

## 2021-11-17 DIAGNOSIS — M47816 Spondylosis without myelopathy or radiculopathy, lumbar region: Secondary | ICD-10-CM | POA: Diagnosis not present

## 2021-11-17 DIAGNOSIS — Z79891 Long term (current) use of opiate analgesic: Secondary | ICD-10-CM | POA: Diagnosis not present

## 2021-11-17 DIAGNOSIS — G894 Chronic pain syndrome: Secondary | ICD-10-CM | POA: Diagnosis not present

## 2021-11-17 DIAGNOSIS — G8929 Other chronic pain: Secondary | ICD-10-CM

## 2021-11-17 DIAGNOSIS — Z5181 Encounter for therapeutic drug level monitoring: Secondary | ICD-10-CM | POA: Diagnosis not present

## 2021-11-17 DIAGNOSIS — M25562 Pain in left knee: Secondary | ICD-10-CM | POA: Insufficient documentation

## 2021-11-17 DIAGNOSIS — R6889 Other general symptoms and signs: Secondary | ICD-10-CM | POA: Diagnosis not present

## 2021-11-17 DIAGNOSIS — M25561 Pain in right knee: Secondary | ICD-10-CM | POA: Insufficient documentation

## 2021-11-17 MED ORDER — DULOXETINE HCL 30 MG PO CPEP: 30.0000 mg | ORAL_CAPSULE | Freq: Every day | ORAL | 2 refills | Status: DC

## 2021-11-17 NOTE — Progress Notes (Signed)
? ?Subjective:  ? ? Patient ID: Alex [name], male    DOB: Jun 25, 1961, 61 y.o.   MRN: [id number] ? ?HPI ? ?61 year old male with past medical history of hypertension diabetes hyperlipidemia, right CTS, cervical spine degeneration status post cervical fusion C3-C5 2019 right total knee arthroplasty with revision in 2020 here for chronic pain.  He reports chronic back pain and knee pain for many years.  Pain is sharp and extension in his lower back. He has been back pain worse with extension as well as occasional shooting pain down his right leg. It was worsened a few weeks ago when he lifted a heavy item.  He uses Voltaren gel with improvement.  He was also prescribed hydrocodone which she reports using sparingly with benefit.  Flexeril tizanidine gabapentin and Lyrica did not help his pain.  Tramadol did not help the pain.  Tylenol and NSAIDs did not help.  He says he sometimes goes to the ER to help with pain treatment.  He has been working  with PT with some benefit.  He had a lumbar spinal epidural injection by Dr. Ernestina Patches August 22, 2021 with benefit but it did not last.  He is followed by Dr. Ninfa Linden orthopedics.  He was seen a couple years ago in 2021 by Dr. Posey Pronto [facility]. ? ? ?Pain Inventory ?Average Pain 7 ?Pain Right Now 7 ?My pain is sharp ? ?In the last 24 hours, has pain interfered with the following? ?General activity 7 ?Relation with others 5 ?Enjoyment of life 5 ?What TIME of day is your pain at its worst? morning , daytime, evening, and night ?Sleep (in general) Poor ? ?Pain is worse with: walking, bending, and sitting ?Pain improves with: heat/ice and TENS ?Relief from Meds:  Meds do not help ? ?use a cane ?use a walker ?ability to climb steps?  yes ?do you drive?  yes ? ?retired ?I need assistance with the following:  dressing, bathing, and household duties ? ?weakness ?trouble walking ?spasms ? ?Any changes since last visit?  no ? ?Any changes since last visit?  no ? ? ? ?Family History  ?Problem  Relation Age of Onset  ? Cancer Mother 81  ?     small intestinal  ? Diabetes Father   ? Hypertension Father   ? Heart attack Father   ? Early death Sister   ? Brain cancer Brother   ? Colon cancer Neg Hx   ? ?Social History  ? ?Socioeconomic History  ? Marital status: Married  ?  Spouse name: Not on file  ? Number of children: 2  ? Years of education: 56  ? Highest education level: 12th grade  ?Occupational History  ? Not on file  ?Tobacco Use  ? Smoking status: Never  ? Smokeless tobacco: Current  ?  Types: Chew  ?Vaping Use  ? Vaping Use: Never used  ?Substance and Sexual Activity  ? Alcohol use: Not Currently  ?  Comment: Quit EtOH 20 years ago  ? Drug use: No  ? Sexual activity: Not on file  ?Other Topics Concern  ? Not on file  ?Social History Narrative  ? Not on file  ? ?Social Determinants of Health  ? ?Financial Resource Strain: Not on file  ?Food Insecurity: Not on file  ?Transportation Needs: Not on file  ?Physical Activity: Not on file  ?Stress: Not on file  ?Social Connections: Not on file  ? ?Past Surgical History:  ?Procedure Laterality Date  ? ANTERIOR CERVICAL DECOMP/DISCECTOMY FUSION  N/A 03/27/2018  ? Procedure: ANTERIOR CERVICAL DECOMPRESSION/DISCECTOMY FUSION C3-5;  Surgeon: Melina Schools, MD;  Location: [facility];  Service: Orthopedics;  Laterality: N/A;  3.5 hrs  ? arm surgery Right   ? x2 involving tendons/ulnar nerve  ? BACK SURGERY    ? BIOPSY  10/17/2020  ? Procedure: BIOPSY;  Surgeon: Eloise Harman, DO;  [facility];  Service: Endoscopy;;  ? CARPAL TUNNEL RELEASE Right   ? clavical surgery    ? ESOPHAGOGASTRODUODENOSCOPY (EGD) WITH PROPOFOL N/A 10/17/2020  ? Procedure: ESOPHAGOGASTRODUODENOSCOPY (EGD) WITH PROPOFOL;  Surgeon: Eloise Harman, DO;  [facility];  Service: Endoscopy;  Laterality: N/A;  PM  ? EYE SURGERY Bilateral   ? glaucoma  ? HERNIA REPAIR    ? JOINT REPLACEMENT Right 06/2011  ? TOTAL KNEE REVISION Right 04/10/2019  ? Procedure: RIGHT TOTAL KNEE  REVISION;  Surgeon: Mcarthur Rossetti, MD;  [facility];  Service: Orthopedics;  Laterality: Right;  ? TRICEPS TENDON REPAIR    ? ?Past Medical History:  ?Diagnosis Date  ? Diabetes mellitus without complication ([place])   ? History of kidney stones   ? removed 5 months ago  ? Hypertension   ? PONV (postoperative nausea and vomiting)   ? AGITATION after anesthesia as well  ? Sleep apnea   ? NO CPAP  ? ?BP (!) 142/86   Pulse 81   Ht 5\' 6"  (1.676 m)   Wt 209 lb (94.8 kg)   SpO2 96%   BMI 33.73 kg/m?  ? ?Opioid Risk Score:   ?Fall Risk Score:  `1 ? ?Depression screen PHQ 2/9 ? ? ?  11/17/2021  ?  2:52 PM 09/01/2021  ?  8:43 AM 07/14/2021  ?  8:42 AM 07/13/2021  ?  9:42 AM 05/24/2021  ? 12:12 PM 05/24/2021  ? 11:40 AM 02/23/2021  ?  9:42 AM  ?Depression screen PHQ 2/9  ?Decreased Interest 3 1 1 1 1  0 0  ?Down, Depressed, Hopeless 1 1 1 1 1  0 0  ?PHQ - 2 Score 4 2 2 2 2  0 0  ?Altered sleeping 3 1 1 1 1  1   ?Tired, decreased energy 1 1 1 1 2  1   ?Change in appetite 1 1 1 1  0  0  ?Feeling bad or failure about yourself  3 1 1 1  0  1  ?Trouble concentrating 1 0 0 0 0  1  ?Moving slowly or fidgety/restless 0 0 0 0 0  0  ?Suicidal thoughts 1 0 0 0 0  0  ?PHQ-9 Score 14 6 6 6 5  4   ?Difficult doing work/chores Very difficult  Not difficult at all      ?  ? ?Review of Systems  ?Constitutional: Negative.   ?HENT: Negative.    ?Eyes: Negative.   ?Respiratory: Negative.    ?Cardiovascular: Negative.   ?Gastrointestinal:  Positive for nausea.  ?Endocrine: Negative.   ?Genitourinary: Negative.   ?Musculoskeletal:  Positive for gait problem.  ?Skin: Negative.   ?Allergic/Immunologic: Negative.   ?Neurological:  Positive for weakness.  ?Hematological: Negative.   ?Psychiatric/Behavioral:  Positive for dysphoric mood and sleep disturbance.   ? ?   ?Objective:  ? Physical Exam ?Gen: no distress, normal appearing ?HEENT: oral mucosa pink and moist, NCAT ?Cardio: Reg rate ?Chest: normal effort, normal rate of breathing ?Abd: soft,  non-distended ?Ext: no edema ?Psych: pleasant, normal affect ?Skin: intact ?Neuro: Cranial nerves II through XII intact sensation intact  in all 4 extremities coordination is normal no sensory deficits answers questions follows commands alert and oriented ?Reflexes decreased and symmetric throughout ?Musculoskeletal: Reports mild pain with internal and external rotation of R hip.  + Lumbar paraspinal tenderness.  Positive facet loading.  Straight leg raise neg but had pain in knees in hips ?Tenderness noted with palpation of both knees no significant crepitus noted ?FAIR and FABER equivocal b/l ?Strength 5 out of 5 in bilateral upper extremities.  Strength also 5 out of 5 in the lower extremities with some pain limitation exception at the hip ? ?Knee L MRI 11/15/21 ? ?IMPRESSION: ?1. Complete tear of the anterior cruciate ligament without ?associated hemorrhage in the intercondylar notch or acute osseous ?findings, likely due to a remote tear (ACL deficient knee). ?2. No evidence of meniscal tear. Probable septated ganglion adjacent ?to the anterior horn of the medial meniscus. ?3. No acute ligamentous or osteochondral findings. ?4. Minimal degenerative changes. ? ?L Spine MRI 07/27/2021 ?Segmentation:  Standard. ?  ?Alignment:  Trace anterolisthesis of L5 over S1. ?  ?Vertebrae: No acute fracture, evidence of discitis, or bone lesion. ?Chronic superior endplate fracture with associated Schmorl node at ?T12 with approximately 50% height loss anteriorly. No retropulsion. ?Hemangiomas at L1, L3 and S2. Postsurgical changes from left ?sacroiliac joint fixation. ?  ?Conus medullaris and cauda equina: Conus extends to the L1 level. ?Conus and cauda equina appear normal. ?  ?Paraspinal and other soft tissues: Negative. ?  ?Disc levels: ?  ?T12-L1: No spinal canal or neural foraminal stenosis. ?  ?L1-2: Loss of disc height, shallow disc bulge with superimposed ?central disc protrusion mild facet degenerative changes resulting  in ?mild spinal canal stenosis. No significant neural foraminal ?narrowing. ?  ?L2-3: No spinal canal or neural foraminal stenosis. ?  ?L3-4: Shallow disc bulge and mild facet degenerative changes without ?signi

## 2021-11-20 ENCOUNTER — Telehealth: Payer: Self-pay | Admitting: Orthopaedic Surgery

## 2021-11-20 DIAGNOSIS — M79604 Pain in right leg: Secondary | ICD-10-CM

## 2021-11-20 NOTE — Telephone Encounter (Signed)
Pt called requesting MRI be read over the phone. Pt does not want to wait til June 5. Please call pt about this matter at [phone]. ?

## 2021-11-20 NOTE — Telephone Encounter (Signed)
Please advise 

## 2021-11-21 ENCOUNTER — Telehealth: Payer: Self-pay | Admitting: Orthopaedic Surgery

## 2021-11-21 NOTE — Telephone Encounter (Signed)
You have already talked to him correct?  ?

## 2021-11-21 NOTE — Telephone Encounter (Signed)
Referral placed in chart  

## 2021-11-21 NOTE — Telephone Encounter (Signed)
Tried calling pt to inform. No answer and no voice mail 

## 2021-11-21 NOTE — Telephone Encounter (Signed)
Patient would like to talk to Dr. Magnus Ivan personally about what is going to be done about his right leg he is tired of only pain pills and would like a way to stop the pain. ?

## 2021-11-21 NOTE — Telephone Encounter (Signed)
Pt returned call to Autumn H. Please call pt at [phone] ?

## 2021-11-23 ENCOUNTER — Other Ambulatory Visit: Payer: Self-pay | Admitting: Orthopaedic Surgery

## 2021-11-23 ENCOUNTER — Telehealth: Payer: Self-pay | Admitting: Orthopaedic Surgery

## 2021-11-23 ENCOUNTER — Ambulatory Visit: Payer: Self-pay

## 2021-11-23 ENCOUNTER — Encounter: Payer: Self-pay | Admitting: Physical Medicine and Rehabilitation

## 2021-11-23 ENCOUNTER — Ambulatory Visit: Payer: Medicare HMO | Admitting: Physical Medicine and Rehabilitation

## 2021-11-23 DIAGNOSIS — R6889 Other general symptoms and signs: Secondary | ICD-10-CM | POA: Diagnosis not present

## 2021-11-23 DIAGNOSIS — M5416 Radiculopathy, lumbar region: Secondary | ICD-10-CM | POA: Diagnosis not present

## 2021-11-23 LAB — TOXASSURE SELECT,+ANTIDEPR,UR

## 2021-11-23 MED ORDER — HYDROCODONE-ACETAMINOPHEN 7.5-325 MG PO TABS: 1.0000 | ORAL_TABLET | Freq: Two times a day (BID) | ORAL | 0 refills | Status: DC | PRN

## 2021-11-23 MED ORDER — METHYLPREDNISOLONE ACETATE 80 MG/ML IJ SUSP
80.0000 mg | Freq: Once | INTRAMUSCULAR | Status: AC
  Administered 2021-11-23: 80 mg

## 2021-11-23 NOTE — Progress Notes (Signed)
Pt state lower back pain that travels to his buttock and down to his right knee. Pt state walking and standing makes the pain worse. Pt state he takes pain med and uses heat to help ease his pain.  Numeric Pain Rating Scale and Functional Assessment Average Pain 8   In the last MONTH (on 0-10 scale) has pain interfered with the following?  1. General activity like being  able to carry out your everyday physical activities such as walking, climbing stairs, carrying groceries, or moving a chair?  Rating(10)   +Driver, -BT, -Dye Allergies.  

## 2021-11-23 NOTE — Patient Instructions (Signed)

## 2021-11-23 NOTE — Telephone Encounter (Signed)
Pt is calling for Pain refill

## 2021-11-28 ENCOUNTER — Ambulatory Visit: Payer: Medicare HMO | Admitting: Physical Medicine & Rehabilitation

## 2021-11-29 ENCOUNTER — Other Ambulatory Visit: Payer: Self-pay

## 2021-11-29 ENCOUNTER — Emergency Department (HOSPITAL_COMMUNITY)
Admission: EM | Admit: 2021-11-29 | Discharge: 2021-11-29 | Discharge status: Against Medical Advice | Payer: Medicare HMO | Attending: Emergency Medicine | Admitting: Emergency Medicine

## 2021-11-29 ENCOUNTER — Ambulatory Visit (INDEPENDENT_AMBULATORY_CARE_PROVIDER_SITE_OTHER): Payer: Medicare HMO | Admitting: Orthopaedic Surgery

## 2021-11-29 ENCOUNTER — Telehealth: Payer: Self-pay | Admitting: *Deleted

## 2021-11-29 ENCOUNTER — Encounter (HOSPITAL_COMMUNITY): Payer: Self-pay | Admitting: Emergency Medicine

## 2021-11-29 DIAGNOSIS — Z5321 Procedure and treatment not carried out due to patient leaving prior to being seen by health care provider: Secondary | ICD-10-CM | POA: Insufficient documentation

## 2021-11-29 DIAGNOSIS — M545 Low back pain, unspecified: Secondary | ICD-10-CM | POA: Insufficient documentation

## 2021-11-29 DIAGNOSIS — R6889 Other general symptoms and signs: Secondary | ICD-10-CM | POA: Diagnosis not present

## 2021-11-29 DIAGNOSIS — S83512A Sprain of anterior cruciate ligament of left knee, initial encounter: Secondary | ICD-10-CM | POA: Diagnosis not present
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
## 2021-11-29 NOTE — ED Triage Notes (Signed)
Pt having lower left back pain, not relied by Aleve or 7.5 Hydrocodone last taken this am.

## 2021-11-29 NOTE — Telephone Encounter (Signed)
Urine drug screen for this encounter is consistent for prescribed medication, but is also positive for THC. 

## 2021-11-30 ENCOUNTER — Other Ambulatory Visit: Payer: Self-pay | Admitting: Family Medicine

## 2021-11-30 DIAGNOSIS — I152 Hypertension secondary to endocrine disorders: Secondary | ICD-10-CM

## 2021-12-06 NOTE — Progress Notes (Signed)
Alex [name] - 61 y.o. male MRN [id number]  Date of birth: [birth year]  Office Visit Note: Visit Date: 11/23/2021 PCP: Dettinger, Fransisca Kaufmann, MD Referred by: Dettinger, Fransisca Kaufmann, MD  Subjective: Chief Complaint  Patient presents with   Lower Back - Pain   Right Knee - Pain   HPI:  Alex [name] is a 61 y.o. male who comes in today at the request of Dr. Jean Rosenthal for planned Right L5-S1 Lumbar Interlaminar epidural steroid injection with fluoroscopic guidance.  The patient has failed conservative care including home exercise, medications, time and activity modification.  This injection will be diagnostic and hopefully therapeutic.  Please see requesting physician notes for further details and justification. MRI reviewed with images and spine model.  MRI reviewed in the note below.   ROS Otherwise per HPI.  Assessment & Plan: Visit Diagnoses:    ICD-10-CM   1. Lumbar radiculopathy  M54.16 XR C-ARM NO REPORT    Epidural Steroid injection    methylPREDNISolone acetate (DEPO-MEDROL) injection 80 mg      Plan: No additional findings.   Meds & Orders:  Meds ordered this encounter  Medications   methylPREDNISolone acetate (DEPO-MEDROL) injection 80 mg    Orders Placed This Encounter  Procedures   XR C-ARM NO REPORT   Epidural Steroid injection    Follow-up: Return if symptoms worsen or fail to improve.   Procedures: No procedures performed  Lumbar Epidural Steroid Injection - Interlaminar Approach with Fluoroscopic Guidance  Patient: Alex [name]      Date of Birth: Oct 23, 1960 MRN: [id number] PCP: Dettinger, Fransisca Kaufmann, MD      Visit Date: 11/23/2021   Universal Protocol:     Consent Given By: the patient  Position: PRONE  Additional Comments: Vital signs were monitored before and after the procedure. Patient was prepped and draped in the usual sterile fashion. The correct patient, procedure, and site was verified.   Injection Procedure Details:    Procedure diagnoses: Lumbar radiculopathy [M54.16]   Meds Administered:  Meds ordered this encounter  Medications   methylPREDNISolone acetate (DEPO-MEDROL) injection 80 mg     Laterality: Right  Location/Site:  L5-S1  Needle: 3.5 in., 20 ga. Tuohy  Needle Placement: Paramedian epidural  Findings:   -Comments: Excellent flow of contrast into the epidural space.  Procedure Details: Using a paramedian approach from the side mentioned above, the region overlying the inferior lamina was localized under fluoroscopic visualization and the soft tissues overlying this structure were infiltrated with 4 ml. of 1% Lidocaine without Epinephrine. The Tuohy needle was inserted into the epidural space using a paramedian approach.   The epidural space was localized using loss of resistance along with counter oblique bi-planar fluoroscopic views.  After negative aspirate for air, blood, and CSF, a 2 ml. volume of Isovue-250 was injected into the epidural space and the flow of contrast was observed. Radiographs were obtained for documentation purposes.    The injectate was administered into the level noted above.   Additional Comments:  The patient tolerated the procedure well Dressing: 2 x 2 sterile gauze and Band-Aid    Post-procedure details: Patient was observed during the procedure. Post-procedure instructions were reviewed.  Patient left the clinic in stable condition.   Clinical History: No specialty comments available.     Objective:  VS:  HT:    WT:   BMI:     BP:   HR: bpm  TEMP: ( )  RESP:  Physical Exam Vitals and  nursing note reviewed.  Constitutional:      General: He is not in acute distress.    Appearance: Normal appearance. He is not ill-appearing.  HENT:     Head: Normocephalic and atraumatic.     Right Ear: External ear normal.     Left Ear: External ear normal.     Nose: No congestion.  Eyes:     Extraocular Movements: Extraocular movements intact.   Cardiovascular:     Rate and Rhythm: Normal rate.     Pulses: Normal pulses.  Pulmonary:     Effort: Pulmonary effort is normal. No respiratory distress.  Abdominal:     General: There is no distension.     Palpations: Abdomen is soft.  Musculoskeletal:        General: No tenderness or signs of injury.     Cervical back: Neck supple.     Right lower leg: No edema.     Left lower leg: No edema.     Comments: Patient has good distal strength without clonus.  Skin:    Findings: No erythema or rash.  Neurological:     General: No focal deficit present.     Mental Status: He is alert and oriented to person, place, and time.     Sensory: No sensory deficit.     Motor: No weakness or abnormal muscle tone.     Coordination: Coordination normal.  Psychiatric:        Mood and Affect: Mood normal.        Behavior: Behavior normal.     Imaging: No results found.

## 2021-12-06 NOTE — Procedures (Signed)
Lumbar Epidural Steroid Injection - Interlaminar Approach with Fluoroscopic Guidance  Patient: Alex [name]      Date of Birth: 1961-01-24 MRN: [id number] PCP: Dettinger, Fransisca Kaufmann, MD      Visit Date: 11/23/2021   Universal Protocol:     Consent Given By: the patient  Position: PRONE  Additional Comments: Vital signs were monitored before and after the procedure. Patient was prepped and draped in the usual sterile fashion. The correct patient, procedure, and site was verified.   Injection Procedure Details:   Procedure diagnoses: Lumbar radiculopathy [M54.16]   Meds Administered:  Meds ordered this encounter  Medications   methylPREDNISolone acetate (DEPO-MEDROL) injection 80 mg     Laterality: Right  Location/Site:  L5-S1  Needle: 3.5 in., 20 ga. Tuohy  Needle Placement: Paramedian epidural  Findings:   -Comments: Excellent flow of contrast into the epidural space.  Procedure Details: Using a paramedian approach from the side mentioned above, the region overlying the inferior lamina was localized under fluoroscopic visualization and the soft tissues overlying this structure were infiltrated with 4 ml. of 1% Lidocaine without Epinephrine. The Tuohy needle was inserted into the epidural space using a paramedian approach.   The epidural space was localized using loss of resistance along with counter oblique bi-planar fluoroscopic views.  After negative aspirate for air, blood, and CSF, a 2 ml. volume of Isovue-250 was injected into the epidural space and the flow of contrast was observed. Radiographs were obtained for documentation purposes.    The injectate was administered into the level noted above.   Additional Comments:  The patient tolerated the procedure well Dressing: 2 x 2 sterile gauze and Band-Aid    Post-procedure details: Patient was observed during the procedure. Post-procedure instructions were reviewed.  Patient left the clinic in stable  condition.

## 2021-12-07 ENCOUNTER — Other Ambulatory Visit: Payer: Self-pay | Admitting: Orthopaedic Surgery

## 2021-12-07 ENCOUNTER — Telehealth: Payer: Self-pay | Admitting: Orthopaedic Surgery

## 2021-12-07 MED ORDER — HYDROCODONE-ACETAMINOPHEN 7.5-325 MG PO TABS: 1.0000 | ORAL_TABLET | Freq: Two times a day (BID) | ORAL | 0 refills | Status: DC | PRN

## 2021-12-07 NOTE — Telephone Encounter (Signed)
Pt called and states that he needs a refill on hydrocodone script to say he can pick it up on Saturday 12/09/2021 since they are closed on sundays.   [phone] [id number]

## 2021-12-08 ENCOUNTER — Ambulatory Visit (INDEPENDENT_AMBULATORY_CARE_PROVIDER_SITE_OTHER): Payer: Medicare HMO | Admitting: Family Medicine

## 2021-12-08 ENCOUNTER — Encounter: Payer: Self-pay | Admitting: Family Medicine

## 2021-12-08 VITALS — BP 124/71 | HR 84 | Temp 97.9°F | Ht 66.0 in | Wt 204.0 lb

## 2021-12-08 DIAGNOSIS — E119 Type 2 diabetes mellitus without complications: Secondary | ICD-10-CM

## 2021-12-08 DIAGNOSIS — E785 Hyperlipidemia, unspecified: Secondary | ICD-10-CM

## 2021-12-08 DIAGNOSIS — E1169 Type 2 diabetes mellitus with other specified complication: Secondary | ICD-10-CM

## 2021-12-08 DIAGNOSIS — I152 Hypertension secondary to endocrine disorders: Secondary | ICD-10-CM

## 2021-12-08 DIAGNOSIS — L219 Seborrheic dermatitis, unspecified: Secondary | ICD-10-CM

## 2021-12-08 DIAGNOSIS — M792 Neuralgia and neuritis, unspecified: Secondary | ICD-10-CM

## 2021-12-08 DIAGNOSIS — M4722 Other spondylosis with radiculopathy, cervical region: Secondary | ICD-10-CM

## 2021-12-08 DIAGNOSIS — E1159 Type 2 diabetes mellitus with other circulatory complications: Secondary | ICD-10-CM

## 2021-12-08 DIAGNOSIS — E669 Obesity, unspecified: Secondary | ICD-10-CM | POA: Diagnosis not present

## 2021-12-08 LAB — BAYER DCA HB A1C WAIVED: HB A1C (BAYER DCA - WAIVED): 5.6 % (ref 4.8–5.6)

## 2021-12-08 MED ORDER — AMLODIPINE BESYLATE 5 MG PO TABS: 10.0000 mg | ORAL_TABLET | Freq: Every day | ORAL | 3 refills | Status: DC

## 2021-12-08 MED ORDER — DICYCLOMINE HCL 10 MG PO CAPS: 10.0000 mg | ORAL_CAPSULE | Freq: Three times a day (TID) | ORAL | 2 refills | Status: DC

## 2021-12-08 MED ORDER — DULOXETINE HCL 30 MG PO CPEP: 30.0000 mg | ORAL_CAPSULE | Freq: Every day | ORAL | 3 refills | Status: DC

## 2021-12-08 MED ORDER — DICLOFENAC SODIUM 1 % EX GEL: 2.0000 g | Freq: Four times a day (QID) | CUTANEOUS | 1 refills | Status: DC

## 2021-12-08 MED ORDER — KETOCONAZOLE 2 % EX SHAM: MEDICATED_SHAMPOO | CUTANEOUS | 1 refills | Status: DC

## 2021-12-08 MED ORDER — DICLOFENAC SODIUM 75 MG PO TBEC: 75.0000 mg | DELAYED_RELEASE_TABLET | Freq: Two times a day (BID) | ORAL | 3 refills | Status: DC

## 2021-12-08 NOTE — Progress Notes (Signed)
BP 124/71   Pulse 84   Temp 97.9 F (36.6 C)   Ht '5\' 6"'  (1.676 m)   Wt 204 lb (92.5 kg)   SpO2 97%   BMI 32.93 kg/m    Subjective:   Patient ID: Alex [name], male    DOB: January 27, 1961, 61 y.o.   MRN: [id number]  HPI: Alex [name] is a 61 y.o. male presenting on 12/08/2021 for Medical Management of Chronic Issues, Diabetes, Hypertension, and Hypothyroidism   HPI Type 2 diabetes mellitus Patient comes in today for recheck of his diabetes. Patient has been currently taking Ozempic, doing well, losing weight, blood sugars running good. Patient is currently on an ACE inhibitor/ARB. Patient has not seen an ophthalmologist this year. Patient denies any issues with their feet. The symptom started onset as an adult hypertension and hyperlipidemia ARE RELATED TO DM   Hypertension Patient is currently on amlodipine and lisinopril, and their blood pressure today is 124/71. Patient denies any lightheadedness or dizziness. Patient denies headaches, blurred vision, chest pains, shortness of breath, or weakness. Denies any side effects from medication and is content with current medication.   Hyperlipidemia Patient is coming in for recheck of his hyperlipidemia. The patient is currently taking Crestor. They deny any issues with myalgias or history of liver damage from it. They deny any focal numbness or weakness or chest pain.   Patient has cervical spine degeneration and osteoarthritis in hips and knees and uses Voltaren and Voltaren gel and they do help with this.  Relevant past medical, surgical, family and social history reviewed and updated as indicated. Interim medical history since our last visit reviewed. Allergies and medications reviewed and updated.  Review of Systems  Constitutional:  Negative for chills and fever.  Eyes:  Negative for visual disturbance.  Respiratory:  Negative for shortness of breath and wheezing.   Cardiovascular:  Negative for chest pain and leg swelling.   Musculoskeletal:  Positive for arthralgias and back pain. Negative for gait problem.  Skin:  Negative for rash.  All other systems reviewed and are negative.  Per HPI unless specifically indicated above   Allergies as of 12/08/2021       Reactions   Morphine And Related Nausea Only   Buprenorphine Hcl-naloxone Hcl Nausea Only   Codeine Nausea Only, Other (See Comments)   per pt, "felt weird in my head"        Medication List        Accurate as of December 08, 2021  8:40 AM. If you have any questions, ask your nurse or doctor.          amLODipine 5 MG tablet Commonly known as: NORVASC Take 2 tablets (10 mg total) by mouth daily.   cyclobenzaprine 10 MG tablet Commonly known as: FLEXERIL Take 1 tablet (10 mg total) by mouth 3 (three) times daily as needed for muscle spasms.   diazepam 5 MG tablet Commonly known as: VALIUM Take 1 tablet (5 mg total) by mouth daily as needed for anxiety.   diclofenac 75 MG EC tablet Commonly known as: VOLTAREN Take 1 tablet (75 mg total) by mouth 2 (two) times daily.   diclofenac Sodium 1 % Gel Commonly known as: Voltaren Apply 2 g topically 4 (four) times daily. What changed:  when to take this reasons to take this   dicyclomine 10 MG capsule Commonly known as: Bentyl Take 1 capsule (10 mg total) by mouth 4 (four) times daily -  before meals and at bedtime.  DULoxetine 30 MG capsule Commonly known as: Cymbalta Take 1 capsule (30 mg total) by mouth daily.   fluticasone 50 MCG/ACT nasal spray Commonly known as: FLONASE Place 1 spray into both nostrils 2 (two) times daily as needed for allergies or rhinitis.   HYDROcodone-acetaminophen 7.5-325 MG tablet Commonly known as: NORCO Take 1 tablet by mouth 2 (two) times daily as needed for moderate pain.   ketoconazole 2 % shampoo Commonly known as: NIZORAL Leave in for 10 minutes then rinse 2 times per week for up to 4 weeks What changed: See the new instructions. Changed by:  Fransisca Kaufmann Zerah Hilyer, MD   lisinopril 40 MG tablet Commonly known as: ZESTRIL Take 1 tablet (40 mg total) by mouth daily.   nabumetone 750 MG tablet Commonly known as: RELAFEN Take 1 tablet (750 mg total) by mouth 2 (two) times daily as needed.   oxymetazoline 0.05 % nasal spray Commonly known as: AFRIN Place 1 spray into both nostrils every 6 (six) hours as needed for congestion.   pantoprazole 40 MG tablet Commonly known as: PROTONIX Take 1 tablet (40 mg total) by mouth 2 (two) times daily before a meal.   rosuvastatin 10 MG tablet Commonly known as: CRESTOR Take 1 tablet (10 mg total) by mouth daily.   Semaglutide (2 MG/DOSE) 8 MG/3ML Sopn Inject 2 mg as directed once a week.   tiZANidine 4 MG tablet Commonly known as: Zanaflex Take 1 tablet (4 mg total) by mouth every 8 (eight) hours as needed for muscle spasms.         Objective:   BP 124/71   Pulse 84   Temp 97.9 F (36.6 C)   Ht '5\' 6"'  (1.676 m)   Wt 204 lb (92.5 kg)   SpO2 97%   BMI 32.93 kg/m   Wt Readings from Last 3 Encounters:  12/08/21 204 lb (92.5 kg)  11/29/21 205 lb (93 kg)  11/17/21 209 lb (94.8 kg)    Physical Exam Vitals and nursing note reviewed.  Constitutional:      General: He is not in acute distress.    Appearance: He is well-developed. He is not diaphoretic.  Eyes:     General: No scleral icterus.    Conjunctiva/sclera: Conjunctivae normal.  Neck:     Thyroid: No thyromegaly.  Cardiovascular:     Rate and Rhythm: Normal rate and regular rhythm.     Heart sounds: Normal heart sounds. No murmur heard. Pulmonary:     Effort: Pulmonary effort is normal. No respiratory distress.     Breath sounds: Normal breath sounds. No wheezing.  Musculoskeletal:     Cervical back: Neck supple.  Lymphadenopathy:     Cervical: No cervical adenopathy.  Skin:    General: Skin is warm and dry.     Findings: No rash.  Neurological:     Mental Status: He is alert and oriented to person, place,  and time.     Coordination: Coordination normal.  Psychiatric:        Behavior: Behavior normal.      Assessment & Plan:   Problem List Items Addressed This Visit       Cardiovascular and Mediastinum   Hypertension associated with diabetes ([place])   Relevant Medications   amLODipine (NORVASC) 5 MG tablet   Other Relevant Orders   CBC with Differential/Platelet   CMP14+EGFR   Lipid panel   Bayer DCA Hb A1c Waived     Endocrine   Diabetes mellitus type 2 in  obese ([place]) - Primary   Relevant Orders   CBC with Differential/Platelet   CMP14+EGFR   Lipid panel   Bayer DCA Hb A1c Waived   Hyperlipidemia associated with type 2 diabetes mellitus ([facility])   Relevant Medications   amLODipine (NORVASC) 5 MG tablet   Other Relevant Orders   CBC with Differential/Platelet   CMP14+EGFR   Lipid panel   Bayer DCA Hb A1c Waived     Musculoskeletal and Integument   Cervical spine degeneration   Relevant Medications   diclofenac (VOLTAREN) 75 MG EC tablet   diclofenac Sodium (VOLTAREN) 1 % GEL   DULoxetine (CYMBALTA) 30 MG capsule     Other   Neuropathic pain   Relevant Medications   DULoxetine (CYMBALTA) 30 MG capsule   Other Visit Diagnoses     Seborrheic dermatitis of scalp       Relevant Medications   ketoconazole (NIZORAL) 2 % shampoo       A1c 5.6 is good, continue medications, no changes  Continue current medicine, seems to be doing very well Follow up plan: Return in about 3 months (around 03/10/2022), or if symptoms worsen or fail to improve, for Diabetes and hyperlipidemia and hypertension.  Counseling provided for all of the vaccine components Orders Placed This Encounter  Procedures   CBC with Differential/Platelet   CMP14+EGFR   Lipid panel   Bayer DCA Hb A1c Waived    Caryl Pina, MD [facility] 12/08/2021, 8:40 AM

## 2021-12-08 NOTE — Telephone Encounter (Signed)
Called multiple times, no answer, voicemail not set up

## 2021-12-09 LAB — CMP14+EGFR
ALT: 44 IU/L (ref 0–44)
AST: 24 IU/L (ref 0–40)
Albumin/Globulin Ratio: 2 (ref 1.2–2.2)
Albumin: 4.5 g/dL (ref 3.8–4.9)
Alkaline Phosphatase: 69 IU/L (ref 44–121)
BUN/Creatinine Ratio: 16 (ref 10–24)
BUN: 14 mg/dL (ref 8–27)
Bilirubin Total: 0.6 mg/dL (ref 0.0–1.2)
CO2: 22 mmol/L (ref 20–29)
Calcium: 9.4 mg/dL (ref 8.6–10.2)
Chloride: 101 mmol/L (ref 96–106)
Creatinine, Ser: 0.9 mg/dL (ref 0.76–1.27)
Globulin, Total: 2.2 g/dL (ref 1.5–4.5)
Glucose: 135 mg/dL — ABNORMAL HIGH (ref 70–99)
Potassium: 4.2 mmol/L (ref 3.5–5.2)
Sodium: 137 mmol/L (ref 134–144)
Total Protein: 6.7 g/dL (ref 6.0–8.5)
eGFR: 98 mL/min/{1.73_m2} (ref >59)

## 2021-12-09 LAB — CBC WITH DIFFERENTIAL/PLATELET
Basophils Absolute: 0 10*3/uL (ref 0.0–0.2)
Basos: 0 %
EOS (ABSOLUTE): 0.1 10*3/uL (ref 0.0–0.4)
Eos: 2 %
Hematocrit: 40.2 % (ref 37.5–51.0)
Hemoglobin: 13.6 g/dL (ref 13.0–17.7)
Immature Grans (Abs): 0 10*3/uL (ref 0.0–0.1)
Immature Granulocytes: 0 %
Lymphocytes Absolute: 1.9 10*3/uL (ref 0.7–3.1)
Lymphs: 25 %
MCH: 29.4 pg (ref 26.6–33.0)
MCHC: 33.8 g/dL (ref 31.5–35.7)
MCV: 87 fL (ref 79–97)
Monocytes Absolute: 0.5 10*3/uL (ref 0.1–0.9)
Monocytes: 6 %
Neutrophils Absolute: 5 10*3/uL (ref 1.4–7.0)
Neutrophils: 67 %
Platelets: 229 10*3/uL (ref 150–450)
RBC: 4.63 x10E6/uL (ref 4.14–5.80)
RDW: 13.6 % (ref 11.6–15.4)
WBC: 7.5 10*3/uL (ref 3.4–10.8)

## 2021-12-09 LAB — LIPID PANEL
Chol/HDL Ratio: 2.5 ratio (ref 0.0–5.0)
Cholesterol, Total: 135 mg/dL (ref 100–199)
HDL: 55 mg/dL (ref >39)
LDL Chol Calc (NIH): 65 mg/dL (ref 0–99)
Triglycerides: 74 mg/dL (ref 0–149)
VLDL Cholesterol Cal: 15 mg/dL (ref 5–40)

## 2021-12-11 ENCOUNTER — Telehealth: Payer: Self-pay | Admitting: Physical Medicine and Rehabilitation

## 2021-12-11 NOTE — Telephone Encounter (Signed)
Patient called. Returning a call to [facility] to schedule with Dr. Alvester Morin.

## 2021-12-11 NOTE — Telephone Encounter (Signed)
Pt called and was very rude stating to contact him to Dr. Romona Curls office. I explained to pt I would send a message to have Shena give him a call to set referral appt and disconnected the call as pt started to yell. Please call pt at [phone].

## 2021-12-12 ENCOUNTER — Telehealth: Payer: Self-pay | Admitting: Family Medicine

## 2021-12-12 DIAGNOSIS — M792 Neuralgia and neuritis, unspecified: Secondary | ICD-10-CM

## 2021-12-12 DIAGNOSIS — M4722 Other spondylosis with radiculopathy, cervical region: Secondary | ICD-10-CM

## 2021-12-12 DIAGNOSIS — M5416 Radiculopathy, lumbar region: Secondary | ICD-10-CM

## 2021-12-13 NOTE — Telephone Encounter (Signed)
Tried calling pt. No answer and vmail not set up. Does he need a referral?

## 2021-12-13 NOTE — Telephone Encounter (Signed)
Pt called to see if Dr Warrick Parisian got his message about him wanting to go to [facility] for pain management.   Pt says Dr Dettinger sent him to see someone within [facility] but pt says hes not going back there because he does not like the Doctor.  Wants to see Dr Donovan Kail at [facility] in [place].   Please advise and call pt with update.

## 2021-12-15 NOTE — Telephone Encounter (Signed)
Go ahead and send a new referral for pain management to [facility] medical for Dr. Donovan Kail for the patient.  Or just have the referral changed.

## 2021-12-18 ENCOUNTER — Encounter: Payer: Medicare HMO | Attending: Physical Medicine & Rehabilitation | Admitting: Physical Medicine & Rehabilitation

## 2021-12-18 ENCOUNTER — Encounter: Payer: Self-pay | Admitting: Physical Medicine & Rehabilitation

## 2021-12-18 ENCOUNTER — Telehealth: Payer: Self-pay

## 2021-12-18 VITALS — BP 137/88 | HR 92 | Ht 66.0 in | Wt 207.2 lb

## 2021-12-18 DIAGNOSIS — F1210 Cannabis abuse, uncomplicated: Secondary | ICD-10-CM | POA: Insufficient documentation

## 2021-12-18 DIAGNOSIS — G8929 Other chronic pain: Secondary | ICD-10-CM | POA: Diagnosis not present

## 2021-12-18 DIAGNOSIS — M25562 Pain in left knee: Secondary | ICD-10-CM | POA: Insufficient documentation

## 2021-12-18 DIAGNOSIS — R6889 Other general symptoms and signs: Secondary | ICD-10-CM | POA: Diagnosis not present

## 2021-12-18 DIAGNOSIS — M25561 Pain in right knee: Secondary | ICD-10-CM | POA: Diagnosis not present

## 2021-12-18 DIAGNOSIS — M47816 Spondylosis without myelopathy or radiculopathy, lumbar region: Secondary | ICD-10-CM | POA: Diagnosis not present

## 2021-12-18 MED ORDER — AMITRIPTYLINE HCL 10 MG PO TABS: 25.0000 mg | ORAL_TABLET | Freq: Every day | ORAL | Status: DC

## 2021-12-18 NOTE — Progress Notes (Unsigned)
Subjective:    Patient ID: Alex [name], male    DOB: [birth year], 61 y.o.   MRN: [id number]  HPI 61 year old male with past medical history of hypertension diabetes hyperlipidemia, right CTS, cervical spine degeneration status post cervical fusion C3-C5 2019 right total knee arthroplasty with revision in 2020 here for chronic pain.  He reports chronic back pain and knee pain for many years.  Pain is sharp and extension in his lower back. He has been back pain worse with extension as well as occasional shooting pain down his right leg. It was worsened a few weeks ago when he lifted a heavy item.  He uses Voltaren gel with improvement.  He was also prescribed hydrocodone which she reports using sparingly with benefit.  Flexeril tizanidine gabapentin and Lyrica did not help his pain.  Tramadol did not help the pain.  Tylenol and NSAIDs did not help.  He says he sometimes goes to the ER to help with pain treatment.  He has been working  with PT with some benefit.  He had a lumbar spinal epidural injection by Dr. Ernestina Patches August 22, 2021 with benefit but it did not last.  He is followed by Dr. Ninfa Linden orthopedics.  He was seen a couple years ago in 2021 by Dr. Posey Pronto [facility].   Interval History Pt here for f/u of his chronic pain. He has been using aleve for his pain.  Patient continues to have pain in his lower back and his left knee.   Pain is  sharp and worse with activities.  He reports being very active with martial arts in the past. 's patient says he only used Cymbalta for a few days because it gave him a strange feeling that he did not like.  He uses hydrocodone ordered by Dr. Ninfa Linden.  He says he got a new mattress that is helping his discomfort at night. He is still having difficulty sleeping. His mood is a decreased related to separation with his wife.  We discussed his positive UDS for marijuana.  Patient reports he has not used this in a few weeks and does not plan to continue using it. He  denies SI or HI.   aleve Hydrocodone New mattress  Pain Inventory Average Pain 6 Pain Right Now 8 My pain is sharp and stabbing  In the last 24 hours, has pain interfered with the following? General activity 7 Relation with others 5 Enjoyment of life 0 What TIME of day is your pain at its worst? morning , daytime, evening, and night Sleep (in general) NA  Pain is worse with: some activites Pain improves with: rest, medication, and TENS Relief from Meds: 8  Family History  Problem Relation Age of Onset   Cancer Mother 56       small intestinal   Diabetes Father    Hypertension Father    Heart attack Father    Early death Sister    Brain cancer Brother    Colon cancer Neg Hx    Social History   Socioeconomic History   Marital status: Married    Spouse name: Not on file   Number of children: 2   Years of education: 12   Highest education level: 12th grade  Occupational History   Not on file  Tobacco Use   Smoking status: Never   Smokeless tobacco: Current    Types: Chew  Vaping Use   Vaping Use: Never used  Substance and Sexual Activity   Alcohol use: Not  Currently    Comment: Quit EtOH 20 years ago   Drug use: No   Sexual activity: Not on file  Other Topics Concern   Not on file  Social History Narrative   Not on file   Social Determinants of Health   Financial Resource Strain: Not on file  Food Insecurity: Not on file  Transportation Needs: Not on file  Physical Activity: Not on file  Stress: Not on file  Social Connections: Not on file   Past Surgical History:  Procedure Laterality Date   ANTERIOR CERVICAL DECOMP/DISCECTOMY FUSION N/A 03/27/2018   Procedure: ANTERIOR CERVICAL DECOMPRESSION/DISCECTOMY FUSION C3-5;  Surgeon: Melina Schools, MD;  Location: [place];  Service: Orthopedics;  Laterality: N/A;  3.5 hrs   arm surgery Right    x2 involving tendons/ulnar nerve   BACK SURGERY     BIOPSY  10/17/2020   Procedure: BIOPSY;  Surgeon: Eloise Harman, DO;  [facility];  Service: Endoscopy;;   CARPAL TUNNEL RELEASE Right    clavical surgery     ESOPHAGOGASTRODUODENOSCOPY (EGD) WITH PROPOFOL N/A 10/17/2020   Procedure: ESOPHAGOGASTRODUODENOSCOPY (EGD) WITH PROPOFOL;  Surgeon: Eloise Harman, DO;  [facility];  Service: Endoscopy;  Laterality: N/A;  PM   EYE SURGERY Bilateral    glaucoma   HERNIA REPAIR     JOINT REPLACEMENT Right 06/2011   TOTAL KNEE REVISION Right 04/10/2019   Procedure: RIGHT TOTAL KNEE REVISION;  Surgeon: Mcarthur Rossetti, MD;  Location: [facility];  Service: Orthopedics;  Laterality: Right;   TRICEPS TENDON REPAIR     Past Surgical History:  Procedure Laterality Date   ANTERIOR CERVICAL DECOMP/DISCECTOMY FUSION N/A 03/27/2018   Procedure: ANTERIOR CERVICAL DECOMPRESSION/DISCECTOMY FUSION C3-5;  Surgeon: Melina Schools, MD;  Location: [place];  Service: Orthopedics;  Laterality: N/A;  3.5 hrs   arm surgery Right    x2 involving tendons/ulnar nerve   BACK SURGERY     BIOPSY  10/17/2020   Procedure: BIOPSY;  Surgeon: Eloise Harman, DO;  [facility];  Service: Endoscopy;;   CARPAL TUNNEL RELEASE Right    clavical surgery     ESOPHAGOGASTRODUODENOSCOPY (EGD) WITH PROPOFOL N/A 10/17/2020   Procedure: ESOPHAGOGASTRODUODENOSCOPY (EGD) WITH PROPOFOL;  Surgeon: Eloise Harman, DO;  [facility];  Service: Endoscopy;  Laterality: N/A;  PM   EYE SURGERY Bilateral    glaucoma   HERNIA REPAIR     JOINT REPLACEMENT Right 06/2011   TOTAL KNEE REVISION Right 04/10/2019   Procedure: RIGHT TOTAL KNEE REVISION;  Surgeon: Mcarthur Rossetti, MD;  Location: [facility];  Service: Orthopedics;  Laterality: Right;   TRICEPS TENDON REPAIR     Past Medical History:  Diagnosis Date   Diabetes mellitus without complication ([place])    History of kidney stones    removed 5 months ago   Hypertension    PONV (postoperative nausea and vomiting)    AGITATION after anesthesia as  well   Sleep apnea    NO CPAP   BP 137/88   Pulse 92   Ht 5\' 6"  (1.676 m)   Wt 207 lb 3.2 oz (94 kg)   SpO2 97%   BMI 33.44 kg/m   Opioid Risk Score:   Fall Risk Score:  `1  Depression screen [facility] 2/9     12/18/2021   10:12 AM 12/08/2021    8:09 AM 11/17/2021    2:52 PM 09/01/2021    8:43 AM 07/14/2021  8:42 AM 07/13/2021    9:42 AM 05/24/2021   12:12 PM  Depression screen PHQ 2/9  Decreased Interest 1 1 3 1 1 1 1   Down, Depressed, Hopeless 1 0 1 1 1 1 1   PHQ - 2 Score 2 1 4 2 2 2 2   Altered sleeping  1 3 1 1 1 1   Tired, decreased energy  2 1 1 1 1 2   Change in appetite  1 1 1 1 1  0  Feeling bad or failure about yourself   3 3 1 1 1  0  Trouble concentrating  1 1 0 0 0 0  Moving slowly or fidgety/restless  0 0 0 0 0 0  Suicidal thoughts  1 1 0 0 0 0  PHQ-9 Score  10 14 6 6 6 5   Difficult doing work/chores  Very difficult Very difficult  Not difficult at all      Review of Systems  Constitutional: Negative.   HENT: Negative.    Eyes: Negative.   Respiratory: Negative.    Cardiovascular: Negative.   Gastrointestinal: Negative.   Endocrine: Negative.   Genitourinary: Negative.   Musculoskeletal:  Positive for back pain.  Skin: Negative.   Allergic/Immunologic: Negative.   Neurological: Negative.   Hematological: Negative.   Psychiatric/Behavioral:  Positive for dysphoric mood.   All other systems reviewed and are negative.      Objective:   Physical Exam  Physical Exam Gen: no distress, normal appearing HEENT: oral mucosa pink and moist, NCAT Cardio: Reg rate Chest: normal effort, normal rate of breathing Abd: soft, non-distended Ext: no edema Psych: pleasant, normal affect Skin: intact Neuro: Cranial nerves II through XII intact sensation intact in all 4 extremities coordination is normal no sensory deficits answers questions follows commands alert and oriented Reflexes decreased and symmetric throughout Musculoskeletal:   + Lumbar paraspinal tenderness.   Positive facet loading.  Straight leg raise neg Tenderness noted with palpation joint line of both knees greater on the left Anterior drawer pos on left No pain with varus or valgus b/l of knees Strength 5 out of 5 in bilateral upper extremities.  Strength also 5 out of 5 in the lower extremities with some pain limitation exception at the hip Wearing L knee brace   Knee L MRI 11/15/21   IMPRESSION: 1. Complete tear of the anterior cruciate ligament without associated hemorrhage in the intercondylar notch or acute osseous findings, likely due to a remote tear (ACL deficient knee). 2. No evidence of meniscal tear. Probable septated ganglion adjacent to the anterior horn of the medial meniscus. 3. No acute ligamentous or osteochondral findings. 4. Minimal degenerative changes.   L Spine MRI 07/27/2021 Segmentation:  Standard.   Alignment:  Trace anterolisthesis of L5 over S1.   Vertebrae: No acute fracture, evidence of discitis, or bone lesion. Chronic superior endplate fracture with associated Schmorl node at T12 with approximately 50% height loss anteriorly. No retropulsion. Hemangiomas at L1, L3 and S2. Postsurgical changes from left sacroiliac joint fixation.   Conus medullaris and cauda equina: Conus extends to the L1 level. Conus and cauda equina appear normal.   Paraspinal and other soft tissues: Negative.   Disc levels:   T12-L1: No spinal canal or neural foraminal stenosis.   L1-2: Loss of disc height, shallow disc bulge with superimposed central disc protrusion mild facet degenerative changes resulting in mild spinal canal stenosis. No significant neural foraminal narrowing.   L2-3: No spinal canal or neural foraminal stenosis.  L3-4: Shallow disc bulge and mild facet degenerative changes without significant spinal canal or neural foraminal stenosis.   L4-5: Hypertrophic facet degenerative changes with bilateral joint effusion with articular process and  periarticular soft tissue edema, left greater than right, suggesting active osteoarthritis. No significant spinal canal or neural foraminal stenosis.   L5-S1: Disc bulge/disc uncovering. Prominent hypertrophic facet degenerative changes with right-sided joint effusion, articular process and periarticular soft tissue edema, suggesting active osteoarthritis. Moderate right and severe left neural foraminal narrowing.   IMPRESSION: 1. Degenerative changes of the lumbar spine, more pronounced at the level of the facet joints at L4-5 and L5-S1 where there is evidence of active osteoarthritis. 2. Moderate right and severe left neural foraminal narrowing at L5-S1.      Assessment & Plan:  Lumbar spondylosis with multilevel degenerative changes of his facet joints and foraminal narrowing severe left moderate right at L5-S1 he has radicular symptoms on the right -He had improvement with ESI but did not last -Facet joint injections may be an option -He would like to avoid using opioid medications if possible or limiting their use -Stop duloxetine 30 mg daily -Will order elavil for pain. It may also help his mood and sleep.  -Patient to continue Voltaren gel  -Continue PT  Marijuana abuse  -Discussed cessation of marijuana  -Urine drug screen positive for marijuana -Discussed that we cannot initiate treatment with hydrocodone if he is using marijuana. Pt says he stopped using this medication a few weeks ago and does not plan to use it further.  -Will repeat random UDS prior to consideration of controlled medications  Bilateral knee pain with hx right total knee replacement -Left knee ACL tear and minimal degenerative changes noted on MRI -Reports left knee injection several weeks ago with benefit -Continue knee brace -Following with orthopedics

## 2021-12-18 NOTE — Telephone Encounter (Signed)
Pt came in the office today c/o not having a referral placed for pain management. Pt has been informed that there is a referral in place and approved with [facility] for him to see Dr. Jeanella Anton. Number given for pt to call their office [phone]. Pt understood and had no further concerns.

## 2021-12-20 ENCOUNTER — Ambulatory Visit (INDEPENDENT_AMBULATORY_CARE_PROVIDER_SITE_OTHER): Payer: Medicare HMO | Admitting: Orthopaedic Surgery

## 2021-12-20 ENCOUNTER — Encounter: Payer: Self-pay | Admitting: Orthopaedic Surgery

## 2021-12-20 ENCOUNTER — Telehealth: Payer: Self-pay | Admitting: Orthopaedic Surgery

## 2021-12-20 ENCOUNTER — Telehealth: Payer: Self-pay | Admitting: Physical Medicine and Rehabilitation

## 2021-12-20 ENCOUNTER — Ambulatory Visit: Payer: Medicare HMO | Admitting: Family Medicine

## 2021-12-20 ENCOUNTER — Telehealth: Payer: Self-pay | Admitting: Family Medicine

## 2021-12-20 DIAGNOSIS — M4722 Other spondylosis with radiculopathy, cervical region: Secondary | ICD-10-CM

## 2021-12-20 DIAGNOSIS — M792 Neuralgia and neuritis, unspecified: Secondary | ICD-10-CM

## 2021-12-20 DIAGNOSIS — M25562 Pain in left knee: Secondary | ICD-10-CM | POA: Diagnosis not present

## 2021-12-20 DIAGNOSIS — G8929 Other chronic pain: Secondary | ICD-10-CM

## 2021-12-20 DIAGNOSIS — M5441 Lumbago with sciatica, right side: Secondary | ICD-10-CM | POA: Diagnosis not present

## 2021-12-20 DIAGNOSIS — R6889 Other general symptoms and signs: Secondary | ICD-10-CM | POA: Diagnosis not present

## 2021-12-20 DIAGNOSIS — M5416 Radiculopathy, lumbar region: Secondary | ICD-10-CM

## 2021-12-20 MED ORDER — HYDROCODONE-ACETAMINOPHEN 7.5-325 MG PO TABS: 1.0000 | ORAL_TABLET | Freq: Two times a day (BID) | ORAL | 0 refills | Status: DC | PRN

## 2021-12-20 NOTE — Progress Notes (Signed)
The patient comes in today still in chronic pain.  He is requesting pain medications.  He has significant issues as relates to his lumbar spine.  He has a known ACL deficient left knee.  I sent him to one of my partners for that who is recommended knee bracing.  The patient reports pain going up into his hip into his back.  He has seen the physical medicine and rehab specialist who performed a drug screen and he did have marijuana in his system.  He is requesting narcotics today until he gets into another pain management specialist.  I can prescribe this medication 1 more time and have sent this to him multiple times but I cannot keep him on narcotics and does not my role to keep someone on chronic narcotics.  I will defer any other pain management to pain specialist or even his primary care physician.  There is nothing else that I have to offer from my standpoint.

## 2021-12-20 NOTE — Telephone Encounter (Signed)
Pt requesting a sooner appt if possible. Please call pt about this matter at [phone].

## 2021-12-20 NOTE — Telephone Encounter (Signed)
Pt states he is having problems with right knee also. Was unable to speak on right knee pains. Please call pt at [phone].

## 2021-12-20 NOTE — Telephone Encounter (Signed)
Pt called requesting an appt with Dr. Ninfa Linden. Pt stating using profanity and bad mouthing Dr. Ninfa Linden. Ask the pt to reframe from using that language. Pt stated to yell and I ended the call. FYI on pt's chart

## 2021-12-25 ENCOUNTER — Other Ambulatory Visit: Payer: Self-pay | Admitting: *Deleted

## 2021-12-25 NOTE — Telephone Encounter (Signed)
Yes I am unsure why a new referral would be sent but if needed then go ahead and put it in.

## 2021-12-26 NOTE — Telephone Encounter (Signed)
New referral placed. Pt informed 

## 2021-12-27 ENCOUNTER — Encounter: Payer: Self-pay | Admitting: Physical Medicine and Rehabilitation

## 2021-12-27 ENCOUNTER — Ambulatory Visit (INDEPENDENT_AMBULATORY_CARE_PROVIDER_SITE_OTHER): Payer: Medicare HMO | Admitting: Physical Medicine and Rehabilitation

## 2021-12-27 ENCOUNTER — Ambulatory Visit: Payer: Self-pay

## 2021-12-27 VITALS — BP 157/93 | HR 74

## 2021-12-27 DIAGNOSIS — M545 Low back pain, unspecified: Secondary | ICD-10-CM

## 2021-12-27 DIAGNOSIS — M5416 Radiculopathy, lumbar region: Secondary | ICD-10-CM | POA: Diagnosis not present

## 2021-12-27 MED ORDER — METHYLPREDNISOLONE ACETATE 80 MG/ML IJ SUSP
80.0000 mg | Freq: Once | INTRAMUSCULAR | Status: AC
  Administered 2021-12-27: 80 mg

## 2021-12-27 NOTE — Progress Notes (Signed)
Pt state lower back pain that travels to his buttock and down to his right knee. Pt state walking and standing makes the pain worse. Pt state he takes pain med and uses heat to help ease his pain.  Numeric Pain Rating Scale and Functional Assessment Average Pain 9   In the last MONTH (on 0-10 scale) has pain interfered with the following?  1. General activity like being  able to carry out your everyday physical activities such as walking, climbing stairs, carrying groceries, or moving a chair?  Rating(10)   +Driver, -BT, -Dye Allergies.

## 2021-12-27 NOTE — Progress Notes (Signed)
Alex [name] - 61 y.o. male MRN [id number]  Date of birth: Dec 19, 1960  Office Visit Note: Visit Date: 12/27/2021 PCP: Dettinger, Elige Radon, MD Referred by: Huel Cote, MD  Subjective: Chief Complaint  Patient presents with   Lower Back - Pain   Right Knee - Pain   HPI:  Alex [name] is a 62 y.o. male who comes in today for planned left L4-5 and L5-S1 facet joint block injection with fluoroscopic guidance.  The patient has failed conservative care including home exercise, medications, time and activity modification.  This injection will be diagnostic and hopefully therapeutic.  Please see requesting physician notes for further details and justification.  Patient really more classically has chronic pain syndrome with levels of pain really that are not going to be amenable to just injection treatment.  I think it is best chance at functional outcome achievement is going to be through chronic pain management unfortunately.  I think he is scheduled to have a consultation with him.  He has had chronic pain management in the past as well.  He unfortunately has some intolerances to buprenorphine and morphine.  Referring: Dr. Doneen Poisson   ROS Otherwise per HPI.  Assessment & Plan: Visit Diagnoses:    ICD-10-CM   1. Lumbar radiculopathy  M54.16 XR C-ARM NO REPORT    Epidural Steroid injection    methylPREDNISolone acetate (DEPO-MEDROL) injection 80 mg      Plan: No additional findings.   Meds & Orders:  Meds ordered this encounter  Medications   methylPREDNISolone acetate (DEPO-MEDROL) injection 80 mg    Orders Placed This Encounter  Procedures   XR C-ARM NO REPORT   Epidural Steroid injection    Follow-up: Return if symptoms worsen or fail to improve.   Procedures: No procedures performed  Lumbar Epidural Steroid Injection - Interlaminar Approach with Fluoroscopic Guidance  Patient: Alex [name]      Date of Birth: 1961/04/18 MRN: [id number] PCP: Dettinger,  Elige Radon, MD      Visit Date: 12/27/2021   Universal Protocol:     Consent Given By: the patient  Position: PRONE  Additional Comments: Vital signs were monitored before and after the procedure. Patient was prepped and draped in the usual sterile fashion. The correct patient, procedure, and site was verified.   Injection Procedure Details:   Procedure diagnoses: Lumbar radiculopathy [M54.16]   Meds Administered:  Meds ordered this encounter  Medications   methylPREDNISolone acetate (DEPO-MEDROL) injection 80 mg     Laterality: Right  Location/Site:  L5-S1  Needle: 3.5 in., 20 ga. Tuohy  Needle Placement: Paramedian epidural  Findings:   -Comments: Excellent flow of contrast into the epidural space.  Procedure Details: Using a paramedian approach from the side mentioned above, the region overlying the inferior lamina was localized under fluoroscopic visualization and the soft tissues overlying this structure were infiltrated with 4 ml. of 1% Lidocaine without Epinephrine. The Tuohy needle was inserted into the epidural space using a paramedian approach.   The epidural space was localized using loss of resistance along with counter oblique bi-planar fluoroscopic views.  After negative aspirate for air, blood, and CSF, a 2 ml. volume of Isovue-250 was injected into the epidural space and the flow of contrast was observed. Radiographs were obtained for documentation purposes.    The injectate was administered into the level noted above.   Additional Comments:  The patient tolerated the procedure well Dressing: 2 x 2 sterile gauze and Band-Aid    Post-procedure details:  Patient was observed during the procedure. Post-procedure instructions were reviewed.  Patient left the clinic in stable condition.   Clinical History: No specialty comments available.     Objective:  VS:  HT:    WT:   BMI:     BP:(!) 157/93  HR:74bpm  TEMP: ( )  RESP:  Physical  Exam Vitals and nursing note reviewed.  Constitutional:      General: He is not in acute distress.    Appearance: Normal appearance. He is not ill-appearing.  HENT:     Head: Normocephalic and atraumatic.     Right Ear: External ear normal.     Left Ear: External ear normal.     Nose: No congestion.  Eyes:     Extraocular Movements: Extraocular movements intact.  Cardiovascular:     Rate and Rhythm: Normal rate.     Pulses: Normal pulses.  Pulmonary:     Effort: Pulmonary effort is normal. No respiratory distress.  Abdominal:     General: There is no distension.     Palpations: Abdomen is soft.  Musculoskeletal:        General: No tenderness or signs of injury.     Cervical back: Neck supple.     Right lower leg: No edema.     Left lower leg: No edema.     Comments: Patient has good distal strength without clonus.  Skin:    Findings: No erythema or rash.  Neurological:     General: No focal deficit present.     Mental Status: He is alert and oriented to person, place, and time.     Sensory: No sensory deficit.     Motor: No weakness or abnormal muscle tone.     Coordination: Coordination normal.  Psychiatric:        Mood and Affect: Mood normal.        Behavior: Behavior normal.      Imaging: No results found.

## 2021-12-27 NOTE — Patient Instructions (Signed)

## 2022-01-01 ENCOUNTER — Ambulatory Visit (INDEPENDENT_AMBULATORY_CARE_PROVIDER_SITE_OTHER): Payer: Medicare HMO | Admitting: Orthopaedic Surgery

## 2022-01-01 ENCOUNTER — Ambulatory Visit (INDEPENDENT_AMBULATORY_CARE_PROVIDER_SITE_OTHER): Payer: Medicare HMO

## 2022-01-01 DIAGNOSIS — Z96651 Presence of right artificial knee joint: Secondary | ICD-10-CM

## 2022-01-03 ENCOUNTER — Encounter: Payer: Medicare HMO | Admitting: Physical Medicine and Rehabilitation

## 2022-01-03 DIAGNOSIS — G8929 Other chronic pain: Secondary | ICD-10-CM | POA: Diagnosis not present

## 2022-01-03 DIAGNOSIS — R300 Dysuria: Secondary | ICD-10-CM | POA: Diagnosis not present

## 2022-01-03 DIAGNOSIS — Z79899 Other long term (current) drug therapy: Secondary | ICD-10-CM | POA: Diagnosis not present

## 2022-01-03 DIAGNOSIS — I10 Essential (primary) hypertension: Secondary | ICD-10-CM | POA: Diagnosis not present

## 2022-01-03 DIAGNOSIS — F1120 Opioid dependence, uncomplicated: Secondary | ICD-10-CM | POA: Diagnosis not present

## 2022-01-03 DIAGNOSIS — M549 Dorsalgia, unspecified: Secondary | ICD-10-CM | POA: Diagnosis not present

## 2022-01-03 DIAGNOSIS — E119 Type 2 diabetes mellitus without complications: Secondary | ICD-10-CM | POA: Diagnosis not present

## 2022-01-03 DIAGNOSIS — Z0130 Encounter for examination of blood pressure without abnormal findings: Secondary | ICD-10-CM | POA: Diagnosis not present

## 2022-01-03 DIAGNOSIS — M542 Cervicalgia: Secondary | ICD-10-CM | POA: Diagnosis not present

## 2022-01-03 NOTE — Telephone Encounter (Signed)
Error

## 2022-01-05 NOTE — Procedures (Signed)
Lumbar Epidural Steroid Injection - Interlaminar Approach with Fluoroscopic Guidance  Patient: Alex [name]      Date of Birth: January 09, 1961 MRN: [id number] PCP: Dettinger, Elige Radon, MD      Visit Date: 12/27/2021   Universal Protocol:     Consent Given By: the patient  Position: PRONE  Additional Comments: Vital signs were monitored before and after the procedure. Patient was prepped and draped in the usual sterile fashion. The correct patient, procedure, and site was verified.   Injection Procedure Details:   Procedure diagnoses: Lumbar radiculopathy [M54.16]   Meds Administered:  Meds ordered this encounter  Medications   methylPREDNISolone acetate (DEPO-MEDROL) injection 80 mg     Laterality: Right  Location/Site:  L5-S1  Needle: 3.5 in., 20 ga. Tuohy  Needle Placement: Paramedian epidural  Findings:   -Comments: Excellent flow of contrast into the epidural space.  Procedure Details: Using a paramedian approach from the side mentioned above, the region overlying the inferior lamina was localized under fluoroscopic visualization and the soft tissues overlying this structure were infiltrated with 4 ml. of 1% Lidocaine without Epinephrine. The Tuohy needle was inserted into the epidural space using a paramedian approach.   The epidural space was localized using loss of resistance along with counter oblique bi-planar fluoroscopic views.  After negative aspirate for air, blood, and CSF, a 2 ml. volume of Isovue-250 was injected into the epidural space and the flow of contrast was observed. Radiographs were obtained for documentation purposes.    The injectate was administered into the level noted above.   Additional Comments:  The patient tolerated the procedure well Dressing: 2 x 2 sterile gauze and Band-Aid    Post-procedure details: Patient was observed during the procedure. Post-procedure instructions were reviewed.  Patient left the clinic in stable  condition.

## 2022-01-15 ENCOUNTER — Telehealth: Payer: Self-pay | Admitting: Physical Medicine and Rehabilitation

## 2022-01-15 NOTE — Telephone Encounter (Signed)
Pt called requesting a call to set an appt. Please call pt at [phone].

## 2022-01-16 ENCOUNTER — Telehealth: Payer: Self-pay | Admitting: Physical Medicine and Rehabilitation

## 2022-01-16 ENCOUNTER — Ambulatory Visit: Payer: Medicare HMO | Admitting: Orthopaedic Surgery

## 2022-01-16 NOTE — Telephone Encounter (Signed)
Pt returned call to [facility]. Please call pt to set an appt. Pt phone number is [phone]

## 2022-01-17 DIAGNOSIS — Z79899 Other long term (current) drug therapy: Secondary | ICD-10-CM | POA: Diagnosis not present

## 2022-01-17 DIAGNOSIS — Z0130 Encounter for examination of blood pressure without abnormal findings: Secondary | ICD-10-CM | POA: Diagnosis not present

## 2022-01-17 DIAGNOSIS — E119 Type 2 diabetes mellitus without complications: Secondary | ICD-10-CM | POA: Diagnosis not present

## 2022-01-17 DIAGNOSIS — M25561 Pain in right knee: Secondary | ICD-10-CM | POA: Diagnosis not present

## 2022-01-17 DIAGNOSIS — Z6834 Body mass index (BMI) 34.0-34.9, adult: Secondary | ICD-10-CM | POA: Diagnosis not present

## 2022-01-17 DIAGNOSIS — E7800 Pure hypercholesterolemia, unspecified: Secondary | ICD-10-CM | POA: Diagnosis not present

## 2022-01-17 DIAGNOSIS — G8929 Other chronic pain: Secondary | ICD-10-CM | POA: Diagnosis not present

## 2022-01-17 DIAGNOSIS — F1120 Opioid dependence, uncomplicated: Secondary | ICD-10-CM | POA: Diagnosis not present

## 2022-01-17 DIAGNOSIS — M47819 Spondylosis without myelopathy or radiculopathy, site unspecified: Secondary | ICD-10-CM | POA: Diagnosis not present

## 2022-01-17 DIAGNOSIS — I10 Essential (primary) hypertension: Secondary | ICD-10-CM | POA: Diagnosis not present

## 2022-01-24 ENCOUNTER — Telehealth: Payer: Self-pay | Admitting: Family Medicine

## 2022-01-24 NOTE — Telephone Encounter (Signed)
Patient would like to speak to [place]. Does not want to talk to a nurse. Needs more Ozempic.

## 2022-01-25 NOTE — Telephone Encounter (Signed)
Patient aware and verbalizes understanding. 

## 2022-01-25 NOTE — Telephone Encounter (Signed)
Please let patient know:   Refills submitted to [facility] patient assistance Patient can call [phone] to check on status It will ship to PCP office  Sample left in fridge to bridge supply

## 2022-01-29 ENCOUNTER — Telehealth: Payer: Self-pay | Admitting: Physical Medicine and Rehabilitation

## 2022-01-29 NOTE — Telephone Encounter (Signed)
Pt called to see if any sooner appt for Dr Alvester Morin. Please call pt at [phone]

## 2022-01-30 ENCOUNTER — Ambulatory Visit (INDEPENDENT_AMBULATORY_CARE_PROVIDER_SITE_OTHER): Payer: Medicare HMO | Admitting: Pharmacist

## 2022-01-30 DIAGNOSIS — E1169 Type 2 diabetes mellitus with other specified complication: Secondary | ICD-10-CM

## 2022-01-31 NOTE — Progress Notes (Signed)
Chronic Care Management Pharmacy Note  01/30/2022 Name:  Alex [name] MRN:  [id number] DOB:  [birth year]  Summary:  Diabetes: Goal on Track (progressing): YES. Controlled-A1c 6.0-->5.6%, GFR>98; current treatment: OZEMPIC 1MG-->increasing to 47m weekly;  We are increasing dose to Ozempic 260mweekly for weight loss  Denies personal and family history of Medullary thyroid cancer (MTC) Dose change form submitted to [employer] (Ozempic 69m35mCurrent glucose readings: fasting glucose: <120, post prandial glucose: <170 Denies hypoglycemic/hyperglycemic symptoms Discussed meal planning options and Plate method for healthy eating Avoid sugary drinks and desserts Incorporate balanced protein, non starchy veggies, 1 serving of carbohydrate with each meal Increase water intake Increase physical activity as able Current exercise: n/a; encouraged-has back pain/issues Recommended increase Ozempic to max dose Assessed patient finances. oDose change form submitted to [employer] (Ozempic 69mg55m approved until 06/07/2022  Hypertension:  Goal on Track (progressing): YES. Uncontrolled; current treatment:amlodipine, lisinopril 40mg46murrent home readings: 140s SBP Denies hypertensive symptoms Educated on DASH diet; weight loss Recommended close follow up; may alter regimen at f/u; patient to try diet/lifestyle   Patient Goals/Self-Care Activities patient will:  - take medications as prescribed as evidenced by patient report and record review check glucose DAILY OR IF SYMPTOMATIC, document, and provide at future appointments check blood pressure 3X WEEKLY OR IF SYMPTOMATIC, document, and provide at future appointments collaborate with provider on medication access solutions target a minimum of 150 minutes of moderate intensity exercise weekly engage in dietary modifications by  FOLLOWING A HEART HEALTHY DIET/HEALTHY PLATE  METHOD    Subjective: Alex Urtonn 61 y.61 year old male who is a primary patient of Dettinger, JoshuFransisca Kaufmann  The CCM team was consulted for assistance with disease management and care coordination needs.    Collaboration with PAP; PCP  for follow up visit in response to provider referral for pharmacy case management and/or care coordination services.   Consent to Services:  The patient was given information about Chronic Care Management services, agreed to services, and gave verbal consent prior to initiation of services.  Please see initial visit note for detailed documentation.   Patient Care Team: Dettinger, JoshuFransisca Kaufmannas PCP - General (Family Medicine) CarveEloise Harmanas Consulting Physician (Gastroenterology) PruitLavera Guise a[facility] Center[facility]ist (Family Medicine)   Objective:  Lab Results  Component Value Date   CREATININE 0.90 12/08/2021   CREATININE 0.80 05/24/2021   CREATININE 1.02 09/11/2020    Lab Results  Component Value Date   HGBA1C 5.6 12/08/2021   Last diabetic Eye exam: No results found for: "HMDIABEYEEXA"  Last diabetic Foot exam: No results found for: "HMDIABFOOTEX"      Component Value Date/Time   CHOL 135 12/08/2021 0814   TRIG 74 12/08/2021 0814   HDL 55 12/08/2021 0814   CHOLHDL 2.5 12/08/2021 0814   LDLCALC 65 12/08/2021 0814       Latest Ref Rng & Units 12/08/2021    8:14 AM 05/24/2021   12:42 PM 09/11/2020    7:14 PM  Hepatic Function  Total Protein 6.0 - 8.5 g/dL 6.7  6.6  6.4   Albumin 3.8 - 4.9 g/dL 4.5  4.5  3.8   AST 0 - 40 IU/L '24  28  27   ' ALT 0 - 44 IU/L 44  36  38   Alk Phosphatase 44 - 121 IU/L 69  83  62   Total Bilirubin 0.0 - 1.2  mg/dL 0.6  0.3  0.5     Lab Results  Component Value Date/Time   TSH 1.180 05/24/2021 12:42 PM       Latest Ref Rng & Units 12/08/2021    8:14 AM 05/24/2021   12:42 PM 09/11/2020    7:14 PM  CBC  WBC 3.4 - 10.8 x10E3/uL 7.5  8.0  9.4   Hemoglobin 13.0 - 17.7 g/dL 13.6  13.3   13.2   Hematocrit 37.5 - 51.0 % 40.2  39.8  39.5   Platelets 150 - 450 x10E3/uL 229  210  203     No results found for: "VD25OH"  Clinical ASCVD: No  The 10-year ASCVD risk score (Arnett DK, et al., 2019) is: 19%   Values used to calculate the score:     Age: 4 years     Sex: Male     Is Non-Hispanic African American: No     Diabetic: Yes     Tobacco smoker: No     Systolic Blood Pressure: 970 mmHg     Is BP treated: Yes     HDL Cholesterol: 55 mg/dL     Total Cholesterol: 135 mg/dL    Other: (CHADS2VASc if Afib, PHQ9 if depression, MMRC or CAT for COPD, ACT, DEXA)  Social History   Tobacco Use  Smoking Status Never  Smokeless Tobacco Current   Types: Chew   BP Readings from Last 3 Encounters:  12/27/21 (!) 157/93  12/18/21 137/88  12/08/21 124/71   Pulse Readings from Last 3 Encounters:  12/27/21 74  12/18/21 92  12/08/21 84   Wt Readings from Last 3 Encounters:  12/18/21 207 lb 3.2 oz (94 kg)  12/08/21 204 lb (92.5 kg)  11/29/21 205 lb (93 kg)    Assessment: Review of patient past medical history, allergies, medications, health status, including review of consultants reports, laboratory and other test data, was performed as part of comprehensive evaluation and provision of chronic care management services.   SDOH:  (Social Determinants of Health) assessments and interventions performed:    CCM Care Plan  Allergies  Allergen Reactions   Morphine And Related Nausea Only   Buprenorphine Hcl-Naloxone Hcl Nausea Only   Codeine Nausea Only and Other (See Comments)    per pt, "felt weird in my head"    Medications Reviewed Today     Reviewed by Lavera Guise, [facility] (Pharmacist) on 01/31/22 at 1142  Med List Status: <None>   Medication Order Taking? Sig Documenting Provider Last Dose Status Informant  amitriptyline (ELAVIL) tablet 25 mg [id number]   Jennye Boroughs, MD  Active   amLODipine (NORVASC) 5 MG tablet [id number] No Take 2 tablets (10 mg total) by  mouth daily. Dettinger, Fransisca Kaufmann, MD Taking Active   cyclobenzaprine (FLEXERIL) 10 MG tablet [id number] No Take 1 tablet (10 mg total) by mouth 3 (three) times daily as needed for muscle spasms. Mcarthur Rossetti, MD Taking Active   diazepam (VALIUM) 5 MG tablet [id number] No Take 1 tablet (5 mg total) by mouth daily as needed for anxiety. Mcarthur Rossetti, MD Taking Active   diclofenac (VOLTAREN) 75 MG EC tablet [id number] No Take 1 tablet (75 mg total) by mouth 2 (two) times daily. Dettinger, Fransisca Kaufmann, MD Taking Active   diclofenac Sodium (VOLTAREN) 1 % GEL [id number] No Apply 2 g topically 4 (four) times daily. Dettinger, Fransisca Kaufmann, MD Taking Active   dicyclomine (BENTYL) 10 MG capsule [id number] No Take 1 capsule (10 mg total)  by mouth 4 (four) times daily -  before meals and at bedtime. Dettinger, Fransisca Kaufmann, MD Taking Active   fluticasone [facility]) 50 MCG/ACT nasal spray [id number] No Place 1 spray into both nostrils 2 (two) times daily as needed for allergies or rhinitis. Dettinger, Fransisca Kaufmann, MD Taking Active   HYDROcodone-acetaminophen [facility]) 7.5-325 MG tablet [id number] No Take 1 tablet by mouth 2 (two) times daily as needed for moderate pain. Mcarthur Rossetti, MD Taking Active   ketoconazole (NIZORAL) 2 % shampoo [id number] No Leave in for 10 minutes then rinse 2 times per week for up to 4 weeks Dettinger, Fransisca Kaufmann, MD Taking Active   lisinopril (ZESTRIL) 40 MG tablet [id number] No Take 1 tablet (40 mg total) by mouth daily. Dettinger, Fransisca Kaufmann, MD Taking Active   nabumetone (RELAFEN) 750 MG tablet [id number] No Take 1 tablet (750 mg total) by mouth 2 (two) times daily as needed. Mcarthur Rossetti, MD Taking Active   oxymetazoline (AFRIN) 0.05 % nasal spray [id number] No Place 1 spray into both nostrils every 6 (six) hours as needed for congestion.  [provider] Taking Active Self  pantoprazole (PROTONIX) 40 MG tablet [id number] No Take 1 tablet (40 mg total) by mouth 2  (two) times daily before a meal. Dettinger, Fransisca Kaufmann, MD Taking Active   rosuvastatin (CRESTOR) 10 MG tablet [id number] No Take 1 tablet (10 mg total) by mouth daily. Dettinger, Fransisca Kaufmann, MD Taking Active   Semaglutide, 2 MG/DOSE, 8 MG/3ML SOPN [id number] No Inject 2 mg as directed once a week. Dettinger, Fransisca Kaufmann, MD Taking Active            Med Note Blanca Friend, Marcellus Scott Jan 31, 2022 11:41 AM) Via [employer] patient assistance program    tiZANidine (ZANAFLEX) 4 MG tablet [id number] No Take 1 tablet (4 mg total) by mouth every 8 (eight) hours as needed for muscle spasms. Mcarthur Rossetti, MD Taking Active             Patient Active Problem List   Diagnosis Date Noted   Hyperlipidemia associated with type 2 diabetes mellitus ([place]) 06/23/2020   Status post revision of total replacement of right knee 04/10/2019   Loose right total knee arthroplasty ([place]) 03/09/2019   Neuropathic pain 01/01/2019   Cervical spine degeneration 12/30/2018   S/P cervical spinal fusion 03/27/2018   Morbid obesity ([place]) 02/21/2018   Recurrent nephrolithiasis 09/18/2017   Diabetes mellitus type 2 in obese ([place]) 03/28/2017   Hypertension associated with diabetes ([place]) 08/25/2016    Immunization History  Administered Date(s) Administered   Influenza,inj,Quad PF,6+ Mos 05/28/2018, 03/30/2019, 04/06/2020, 03/24/2021   Influenza-Unspecified 05/21/2017   Janssen (J&J) SARS-COV-2 Vaccination 11/09/2019   Moderna Sars-Covid-2 Vaccination 07/19/2020   Pneumococcal Polysaccharide-23 02/21/2018   Tdap 05/24/2021    Conditions to be addressed/monitored: HTN, HLD, and DMII  Care Plan : PHARMD MEDICATION MANAGEMENT  Updates made by Lavera Guise, [place] since 01/31/2022 12:00 AM     Problem: DISEASE PROGRESSION PREVENTION      Long-Range Goal: T2DM, HLD   This Visit's Progress: On track  Recent Progress: Not on track  Note:   Current Barriers:  Unable to independently afford treatment  regimen Unable to maintain control of T2DM, HLD, HTN  Pharmacist Clinical Goal(s):  patient will verbalize ability to afford treatment regimen maintain control of T2DM, HLD, HTN as evidenced by GOAL A1C<7%, LDL<70, BP<130/80  through collaboration with PharmD and provider.    Interventions: 1:1  collaboration with Dettinger, Fransisca Kaufmann, MD regarding development and update of comprehensive plan of care as evidenced by provider attestation and co-signature Inter-disciplinary care team collaboration (see longitudinal plan of care) Comprehensive medication review performed; medication list updated in electronic medical record  Diabetes: Goal on Track (progressing): YES. Controlled-A1c 6.0-->5.6%, GFR>98; current treatment: OZEMPIC 1MG-->increasing to 60m weekly;  We are increasing dose to Ozempic 263mweekly for weight loss  Denies personal and family history of Medullary thyroid cancer (MTC) Dose change form submitted to [employer] (Ozempic 63m81mCurrent glucose readings: fasting glucose: <120, post prandial glucose: <170 Denies hypoglycemic/hyperglycemic symptoms Discussed meal planning options and Plate method for healthy eating Avoid sugary drinks and desserts Incorporate balanced protein, non starchy veggies, 1 serving of carbohydrate with each meal Increase water intake Increase physical activity as able Current exercise: n/a; encouraged-has back pain/issues Recommended increase Ozempic to max dose Assessed patient finances. oDose change form submitted to [employer] (Ozempic 63mg20m approved until 06/07/2022  Hypertension:  Goal on Track (progressing): YES. Uncontrolled; current treatment:amlodipine, lisinopril 40mg63murrent home readings: 140s SBP Denies hypertensive symptoms Educated on DASH diet; weight loss Recommended close follow up; may alter regimen at f/u; patient to try diet/lifestyle   Patient Goals/Self-Care  Activities patient will:  - take medications as prescribed as evidenced by patient report and record review check glucose DAILY OR IF SYMPTOMATIC, document, and provide at future appointments check blood pressure 3X WEEKLY OR IF SYMPTOMATIC, document, and provide at future appointments collaborate with provider on medication access solutions target a minimum of 150 minutes of moderate intensity exercise weekly engage in dietary modifications by    FOLLOWING A HEART HEALTHY DIET/HEALTHY PLATE METHOD       Medication Assistance:  ozempic obtained through [employer] medication assistance program.  Enrollment ends 06/07/2022  [facility] [address]: [phone] Up:  Patient agrees to Care Plan and Follow-up.  Plan: Telephone follow up appointment with care management team member scheduled for:  05/2022    JulieRegina EckrmD, BCPS Clinical Pharmacist, [place] [phone]

## 2022-01-31 NOTE — Patient Instructions (Addendum)
Visit Information  Following are the goals we discussed today:  Current Barriers:  Unable to independently afford treatment regimen Unable to maintain control of T2DM, HLD, HTN  Pharmacist Clinical Goal(s):  patient will verbalize ability to afford treatment regimen maintain control of T2DM, HLD, HTN as evidenced by GOAL A1C<7%, LDL<70, BP<130/80  through collaboration with PharmD and provider.    Interventions: 1:1 collaboration with Dettinger, Elige Radon, MD regarding development and update of comprehensive plan of care as evidenced by provider attestation and co-signature Inter-disciplinary care team collaboration (see longitudinal plan of care) Comprehensive medication review performed; medication list updated in electronic medical record  Diabetes: Goal on Track (progressing): YES. Controlled-A1c 6.0-->5.6%, GFR>98; current treatment: OZEMPIC 1MG -->increasing to 2mg  weekly;  We are increasing dose to Ozempic 2mg  weekly for weight loss  Denies personal and family history of Medullary thyroid cancer (MTC) Dose change form submitted to Patient Assistance Program (Ozempic 2mg ) Current glucose readings: fasting glucose: <120, post prandial glucose: <170 Denies hypoglycemic/hyperglycemic symptoms Discussed meal planning options and Plate method for healthy eating Avoid sugary drinks and desserts Incorporate balanced protein, non starchy veggies, 1 serving of carbohydrate with each meal Increase water intake Increase physical activity as able Current exercise: n/a; encouraged-has back pain/issues Recommended increase Ozempic to max dose Assessed patient finances. oDose change form submitted to Patient Assistance Program (Ozempic 2mg );; approved until 06/07/2022  Hypertension:  Goal on Track (progressing): YES. Uncontrolled; current treatment:amlodipine, lisinopril 40mg ;  Current home readings: 140s SBP Denies hypertensive symptoms Educated on DASH diet;  weight loss Recommended close follow up; may alter regimen at f/u; patient to try diet/lifestyle   Patient Goals/Self-Care Activities patient will:  - take medications as prescribed as evidenced by patient report and record review check glucose DAILY OR IF SYMPTOMATIC, document, and provide at future appointments check blood pressure 3X WEEKLY OR IF SYMPTOMATIC, document, and provide at future appointments collaborate with provider on medication access solutions target a minimum of 150 minutes of moderate intensity exercise weekly engage in dietary modifications by  FOLLOWING A HEART HEALTHY DIET/HEALTHY PLATE METHOD    Plan: Telephone follow up appointment with care management team member scheduled for:  06/07/2022  Signature , PharmD, BCPS Clinical Pharmacist, [facility]  II Phone [phone]   Please call the care guide team at [phone] if you need to cancel or reschedule your appointment.   The patient verbalized understanding of instructions, educational materials, and care plan provided today and DECLINED offer to receive copy of patient instructions, educational materials, and care plan.

## 2022-02-02 ENCOUNTER — Other Ambulatory Visit: Payer: Self-pay | Admitting: *Deleted

## 2022-02-02 DIAGNOSIS — G8929 Other chronic pain: Secondary | ICD-10-CM

## 2022-02-02 DIAGNOSIS — G894 Chronic pain syndrome: Secondary | ICD-10-CM

## 2022-02-02 DIAGNOSIS — Z5181 Encounter for therapeutic drug level monitoring: Secondary | ICD-10-CM

## 2022-02-02 DIAGNOSIS — Z79891 Long term (current) use of opiate analgesic: Secondary | ICD-10-CM

## 2022-02-05 ENCOUNTER — Telehealth: Payer: Self-pay

## 2022-02-05 DIAGNOSIS — E669 Obesity, unspecified: Secondary | ICD-10-CM

## 2022-02-05 DIAGNOSIS — E1169 Type 2 diabetes mellitus with other specified complication: Secondary | ICD-10-CM

## 2022-02-05 DIAGNOSIS — M25561 Pain in right knee: Secondary | ICD-10-CM | POA: Diagnosis not present

## 2022-02-05 DIAGNOSIS — Z79891 Long term (current) use of opiate analgesic: Secondary | ICD-10-CM | POA: Diagnosis not present

## 2022-02-05 DIAGNOSIS — M25562 Pain in left knee: Secondary | ICD-10-CM | POA: Diagnosis not present

## 2022-02-05 DIAGNOSIS — E785 Hyperlipidemia, unspecified: Secondary | ICD-10-CM | POA: Diagnosis not present

## 2022-02-05 DIAGNOSIS — Z5181 Encounter for therapeutic drug level monitoring: Secondary | ICD-10-CM | POA: Diagnosis not present

## 2022-02-05 DIAGNOSIS — G8929 Other chronic pain: Secondary | ICD-10-CM | POA: Diagnosis not present

## 2022-02-05 DIAGNOSIS — G894 Chronic pain syndrome: Secondary | ICD-10-CM | POA: Diagnosis not present

## 2022-02-05 NOTE — Telephone Encounter (Signed)
Patient was called to inform him of a UDS order that needs to be completed, in 24 hour. (After he has been informed). He may go to any [employer] to have it collected.  Patient has been informed as of 8:45 AM today, 02/06/2020.

## 2022-02-06 ENCOUNTER — Ambulatory Visit: Payer: Medicare HMO | Admitting: Orthopaedic Surgery

## 2022-02-08 ENCOUNTER — Ambulatory Visit: Payer: Self-pay

## 2022-02-08 ENCOUNTER — Encounter: Payer: Self-pay | Admitting: Physical Medicine and Rehabilitation

## 2022-02-08 ENCOUNTER — Ambulatory Visit (INDEPENDENT_AMBULATORY_CARE_PROVIDER_SITE_OTHER): Payer: Medicare HMO | Admitting: Physical Medicine and Rehabilitation

## 2022-02-08 VITALS — BP 145/98 | HR 93

## 2022-02-08 DIAGNOSIS — M5416 Radiculopathy, lumbar region: Secondary | ICD-10-CM

## 2022-02-08 MED ORDER — METHYLPREDNISOLONE ACETATE 80 MG/ML IJ SUSP
80.0000 mg | Freq: Once | INTRAMUSCULAR | Status: AC
  Administered 2022-02-08: 80 mg

## 2022-02-08 NOTE — Patient Instructions (Signed)

## 2022-02-08 NOTE — Progress Notes (Signed)
Pt state lower back pain that travels to his buttock and down to his right knee. Pt state walking and standing makes the pain worse. Pt state he takes pain med and uses heat to help ease his pain.  Numeric Pain Rating Scale and Functional Assessment Average Pain 8   In the last MONTH (on 0-10 scale) has pain interfered with the following?  1. General activity like being  able to carry out your everyday physical activities such as walking, climbing stairs, carrying groceries, or moving a chair?  Rating(10)   +Driver, -BT, -Dye Allergies.

## 2022-02-09 LAB — TOXASSURE SELECT,+ANTIDEPR,UR

## 2022-02-12 ENCOUNTER — Telehealth: Payer: Self-pay | Admitting: *Deleted

## 2022-02-12 NOTE — Telephone Encounter (Signed)
Urine drug screen for this encounter is consistent for prescribed medication 

## 2022-02-13 ENCOUNTER — Encounter: Payer: Medicare HMO | Attending: Physical Medicine & Rehabilitation | Admitting: Physical Medicine & Rehabilitation

## 2022-02-13 ENCOUNTER — Encounter: Payer: Self-pay | Admitting: Physical Medicine & Rehabilitation

## 2022-02-13 VITALS — BP 141/95 | HR 84 | Ht 66.0 in | Wt 210.0 lb

## 2022-02-13 DIAGNOSIS — M47816 Spondylosis without myelopathy or radiculopathy, lumbar region: Secondary | ICD-10-CM | POA: Insufficient documentation

## 2022-02-13 DIAGNOSIS — M25561 Pain in right knee: Secondary | ICD-10-CM | POA: Insufficient documentation

## 2022-02-13 DIAGNOSIS — G8929 Other chronic pain: Secondary | ICD-10-CM | POA: Diagnosis not present

## 2022-02-13 DIAGNOSIS — M25562 Pain in left knee: Secondary | ICD-10-CM | POA: Insufficient documentation

## 2022-02-13 DIAGNOSIS — F1210 Cannabis abuse, uncomplicated: Secondary | ICD-10-CM | POA: Insufficient documentation

## 2022-02-13 MED ORDER — HYDROCODONE-ACETAMINOPHEN 7.5-325 MG PO TABS: 1.0000 | ORAL_TABLET | Freq: Four times a day (QID) | ORAL | 0 refills | Status: DC | PRN

## 2022-02-13 NOTE — Progress Notes (Addendum)
Subjective:    Patient ID: Alex [name], male    DOB: 06/20/1961, 61 y.o.   MRN: [id number]  HPI HPI 61 year old male with past medical history of hypertension diabetes hyperlipidemia, right CTS, cervical spine degeneration status post cervical fusion C3-C5 2019 right total knee arthroplasty with revision in 2020 here for chronic pain.  He reports chronic back pain and knee pain for many years.  Pain is sharp and extension in his lower back. He has been back pain worse with extension as well as occasional shooting pain down his right leg. It was worsened a few weeks ago when he lifted a heavy item.  He uses Voltaren gel with improvement.  He was also prescribed hydrocodone which she reports using sparingly with benefit.  Flexeril tizanidine gabapentin and Lyrica did not help his pain.  Tramadol did not help the pain.  Tylenol and NSAIDs did not help.  He says he sometimes goes to the ER to help with pain treatment.  He has been working  with PT with some benefit.  He had a lumbar spinal epidural injection by Dr. Ernestina Patches August 22, 2021 with benefit but it did not last.  He is followed by Dr. Ninfa Linden orthopedics.  He was seen a couple years ago in 2021 by Dr. Posey Pronto [facility].  Interval History 8/8 Alex [name] is here for follow-up regarding his chronic pain.  Pain continues to be worse in his lower back and left knee.  His last UDS did not indicate use of marijuana.  Patient had a recent epidural steroid injection completed by Dr. Ernestina Patches , he reports minimal improvement.  Patient reports Elavil did not help his pain.  He has been seeing [facility] and has been prescribed hydrocodone 7.5 mg 3 times a day.  Medication is helping his pain however it only last for about 4 hours leaving him in pain when the medicine wears off.  He sometimes uses Aleve in between to help keep the pain tolerable. Patient reports if pain does not improve he would like to try Skelaxin because he had a friend that had  benefit from this.   Pain Inventory Average Pain 8 Pain Right Now 9 My pain is intermittent, sharp, stabbing, and aching  In the last 24 hours, has pain interfered with the following? General activity 2 Relation with others 3 Enjoyment of life 0 What TIME of day is your pain at its worst? morning , daytime, evening, and night Sleep (in general) Fair  Pain is worse with: walking and standing Pain improves with: medication Relief from Meds: 7  Family History  Problem Relation Age of Onset   Cancer Mother 38       small intestinal   Diabetes Father    Hypertension Father    Heart attack Father    Early death Sister    Brain cancer Brother    Colon cancer Neg Hx    Social History   Socioeconomic History   Marital status: Married    Spouse name: Not on file   Number of children: 2   Years of education: 12   Highest education level: 12th grade  Occupational History   Not on file  Tobacco Use   Smoking status: Never   Smokeless tobacco: Current    Types: Chew  Vaping Use   Vaping Use: Never used  Substance and Sexual Activity   Alcohol use: Not Currently    Comment: Quit EtOH 20 years ago   Drug use: No  Sexual activity: Not on file  Other Topics Concern   Not on file  Social History Narrative   Not on file   Social Determinants of Health   Financial Resource Strain: Not on file  Food Insecurity: Not on file  Transportation Needs: Not on file  Physical Activity: Not on file  Stress: Not on file  Social Connections: Not on file   Past Surgical History:  Procedure Laterality Date   ANTERIOR CERVICAL DECOMP/DISCECTOMY FUSION N/A 03/27/2018   Procedure: ANTERIOR CERVICAL DECOMPRESSION/DISCECTOMY FUSION C3-5;  Surgeon: Melina Schools, MD;  Location: [place];  Service: Orthopedics;  Laterality: N/A;  3.5 hrs   arm surgery Right    x2 involving tendons/ulnar nerve   BACK SURGERY     BIOPSY  10/17/2020   Procedure: BIOPSY;  Surgeon: Eloise Harman, DO;   [facility];  Service: Endoscopy;;   CARPAL TUNNEL RELEASE Right    clavical surgery     ESOPHAGOGASTRODUODENOSCOPY (EGD) WITH PROPOFOL N/A 10/17/2020   Procedure: ESOPHAGOGASTRODUODENOSCOPY (EGD) WITH PROPOFOL;  Surgeon: Eloise Harman, DO;  [facility];  Service: Endoscopy;  Laterality: N/A;  PM   EYE SURGERY Bilateral    glaucoma   HERNIA REPAIR     JOINT REPLACEMENT Right 06/2011   TOTAL KNEE REVISION Right 04/10/2019   Procedure: RIGHT TOTAL KNEE REVISION;  Surgeon: Mcarthur Rossetti, MD;  Location: [facility];  Service: Orthopedics;  Laterality: Right;   TRICEPS TENDON REPAIR     Past Surgical History:  Procedure Laterality Date   ANTERIOR CERVICAL DECOMP/DISCECTOMY FUSION N/A 03/27/2018   Procedure: ANTERIOR CERVICAL DECOMPRESSION/DISCECTOMY FUSION C3-5;  Surgeon: Melina Schools, MD;  Location: [place];  Service: Orthopedics;  Laterality: N/A;  3.5 hrs   arm surgery Right    x2 involving tendons/ulnar nerve   BACK SURGERY     BIOPSY  10/17/2020   Procedure: BIOPSY;  Surgeon: Eloise Harman, DO;  [facility];  Service: Endoscopy;;   CARPAL TUNNEL RELEASE Right    clavical surgery     ESOPHAGOGASTRODUODENOSCOPY (EGD) WITH PROPOFOL N/A 10/17/2020   Procedure: ESOPHAGOGASTRODUODENOSCOPY (EGD) WITH PROPOFOL;  Surgeon: Eloise Harman, DO;  [facility];  Service: Endoscopy;  Laterality: N/A;  PM   EYE SURGERY Bilateral    glaucoma   HERNIA REPAIR     JOINT REPLACEMENT Right 06/2011   TOTAL KNEE REVISION Right 04/10/2019   Procedure: RIGHT TOTAL KNEE REVISION;  Surgeon: Mcarthur Rossetti, MD;  Location: [facility];  Service: Orthopedics;  Laterality: Right;   TRICEPS TENDON REPAIR     Past Medical History:  Diagnosis Date   Diabetes mellitus without complication ([place])    History of kidney stones    removed 5 months ago   Hypertension    PONV (postoperative nausea and vomiting)    AGITATION after anesthesia as well   Sleep  apnea    NO CPAP   BP (!) 141/95   Pulse 84   Ht 5\' 6"  (1.676 m)   Wt 210 lb (95.3 kg)   SpO2 97%   BMI 33.89 kg/m   Opioid Risk Score:   Fall Risk Score:  `1  Depression screen [facility] 2/9     02/13/2022   10:28 AM 12/18/2021   10:12 AM 12/08/2021    8:09 AM 11/17/2021    2:52 PM 09/01/2021    8:43 AM 07/14/2021    8:42 AM 07/13/2021    9:42 AM  Depression screen PHQ  2/9  Decreased Interest 0 1 1 3 1 1 1   Down, Depressed, Hopeless 0 1 0 1 1 1 1   PHQ - 2 Score 0 2 1 4 2 2 2   Altered sleeping   1 3 1 1 1   Tired, decreased energy   2 1 1 1 1   Change in appetite   1 1 1 1 1   Feeling bad or failure about yourself    3 3 1 1 1   Trouble concentrating   1 1 0 0 0  Moving slowly or fidgety/restless   0 0 0 0 0  Suicidal thoughts   1 1 0 0 0  PHQ-9 Score   10 14 6 6 6   Difficult doing work/chores   Very difficult Very difficult  Not difficult at all       Review of Systems  Musculoskeletal:  Positive for back pain.       Right knee pain Left leg pain   All other systems reviewed and are negative.      Objective:   Physical Exam  Gen: no distress, normal appearing HEENT: oral mucosa pink and moist, NCAT Cardio: Reg rate Chest: normal effort, normal rate of breathing Abd: soft, non-distended Ext: no edema Psych: pleasant, normal affect Skin: intact Neuro: Alert, cranial nerves II through XII grossly intact, sensation intact in all 4 extremities coordination is normal no sensory deficits answers questions follows commands   Musculoskeletal:   + Lumbar paraspinal tenderness.  Positive facet loading.  Straight leg raise neg Tenderness noted with palpation joint line of both knees greater on the left than the right anterior drawer pos on left No pain with varus or valgus b/l of knees Strength 5 out of 5 in bilateral upper extremities.  Strength also 5 out of 5 in the lower extremities with some pain limitation exception at the hip    Knee L MRI 11/15/21   IMPRESSION: 1.  Complete tear of the anterior cruciate ligament without associated hemorrhage in the intercondylar notch or acute osseous findings, likely due to a remote tear (ACL deficient knee). 2. No evidence of meniscal tear. Probable septated ganglion adjacent to the anterior horn of the medial meniscus. 3. No acute ligamentous or osteochondral findings. 4. Minimal degenerative changes.   L Spine MRI 07/27/2021 Segmentation:  Standard.   Alignment:  Trace anterolisthesis of L5 over S1.   Vertebrae: No acute fracture, evidence of discitis, or bone lesion. Chronic superior endplate fracture with associated Schmorl node at T12 with approximately 50% height loss anteriorly. No retropulsion. Hemangiomas at L1, L3 and S2. Postsurgical changes from left sacroiliac joint fixation.   Conus medullaris and cauda equina: Conus extends to the L1 level. Conus and cauda equina appear normal.   Paraspinal and other soft tissues: Negative.   Disc levels:   T12-L1: No spinal canal or neural foraminal stenosis.   L1-2: Loss of disc height, shallow disc bulge with superimposed central disc protrusion mild facet degenerative changes resulting in mild spinal canal stenosis. No significant neural foraminal narrowing.   L2-3: No spinal canal or neural foraminal stenosis.   L3-4: Shallow disc bulge and mild facet degenerative changes without significant spinal canal or neural foraminal stenosis.   L4-5: Hypertrophic facet degenerative changes with bilateral joint effusion with articular process and periarticular soft tissue edema, left greater than right, suggesting active osteoarthritis. No significant spinal canal or neural foraminal stenosis.   L5-S1: Disc bulge/disc uncovering. Prominent hypertrophic facet degenerative changes with right-sided joint effusion,  articular process and periarticular soft tissue edema, suggesting active osteoarthritis. Moderate right and severe left neural  foraminal narrowing.   IMPRESSION: 1. Degenerative changes of the lumbar spine, more pronounced at the level of the facet joints at L4-5 and L5-S1 where there is evidence of active osteoarthritis. 2. Moderate right and severe left neural foraminal narrowing at L5-S1.      Assessment & Plan:   Lumbar spondylosis with multilevel degenerative changes of his facet joints and foraminal narrowing severe left moderate right at L5-S1 he has radicular symptoms on the right -Minimal improvement reported with ESI -Facet joint injections may be an option -Poor results with duloxetine, elavil -S/p lumbar ESI 12/27/21 by Dr Alvester Morin -Will continue Norco 7.5, will increase to QID -Reviewed pain agreement and provided copy -Patient had received his last hydrocodone dose from [facility] however he says he will discontinue treatment at this clinic    Marijuana abuse  -Repeat UDS neg for marijuana  -Discussed continued cessation from this drug   Bilateral knee pain with hx right total knee replacement -Left knee ACL tear and minimal degenerative changes noted on MRI -Continue Norco as above -Consider repeat knee injection at later time -He follows with Dr. Magnus Ivan orthopedics   9/11 Notified by Suezanne Jacquet regarding pt report he is out of all his pain medications, He is due for refill but appears he got short term medications from ortho practice for acute exacerbation, pt reported he called to inform out clinic and its related to up coming surgery.  Will refill today but warning was provided regarding receiving any pain further medications from outside providers or violations of opioid agreement will result in discharge from the practice.

## 2022-02-14 ENCOUNTER — Encounter (INDEPENDENT_AMBULATORY_CARE_PROVIDER_SITE_OTHER): Payer: Self-pay

## 2022-02-18 NOTE — Progress Notes (Signed)
[name] - 61 y.o. male MRN [id number]  Date of birth: Sep 15, 1960  Office Visit Note: Visit Date: 02/08/2022 PCP: Dettinger, Elige Radon, MD Referred by: Dettinger, Elige Radon, MD  Subjective: Chief Complaint  Patient presents with   Lower Back - Pain   Right Knee - Pain   HPI:  Alex [name] is a 61 y.o. male who comes in today at the request of Dr. Doneen Poisson for planned Left L5-S1 Lumbar Interlaminar epidural steroid injection with fluoroscopic guidance.  The patient has failed conservative care including home exercise, medications, time and activity modification.  This injection will be diagnostic and hopefully therapeutic.  Please see requesting physician notes for further details and justification.   ROS Otherwise per HPI.  Assessment & Plan: Visit Diagnoses:    ICD-10-CM   1. Lumbar radiculopathy  M54.16 XR C-ARM NO REPORT    Epidural Steroid injection    methylPREDNISolone acetate (DEPO-MEDROL) injection 80 mg      Plan: No additional findings.   Meds & Orders:  Meds ordered this encounter  Medications   methylPREDNISolone acetate (DEPO-MEDROL) injection 80 mg    Orders Placed This Encounter  Procedures   XR C-ARM NO REPORT   Epidural Steroid injection    Follow-up: Return for visit to requesting provider as needed.   Procedures: No procedures performed  Lumbar Epidural Steroid Injection - Interlaminar Approach with Fluoroscopic Guidance  Patient: Alex [name]      Date of Birth: 13-Oct-1960 MRN: [id number] PCP: Dettinger, Elige Radon, MD      Visit Date: 02/08/2022   Universal Protocol:     Consent Given By: the patient  Position: PRONE  Additional Comments: Vital signs were monitored before and after the procedure. Patient was prepped and draped in the usual sterile fashion. The correct patient, procedure, and site was verified.   Injection Procedure Details:   Procedure diagnoses: Lumbar radiculopathy [M54.16]   Meds Administered:   Meds ordered this encounter  Medications   methylPREDNISolone acetate (DEPO-MEDROL) injection 80 mg     Laterality: Left  Location/Site:  L5-S1  Needle: 3.5 in., 20 ga. Tuohy  Needle Placement: Paramedian epidural  Findings:   -Comments: Excellent flow of contrast into the epidural space.  Procedure Details: Using a paramedian approach from the side mentioned above, the region overlying the inferior lamina was localized under fluoroscopic visualization and the soft tissues overlying this structure were infiltrated with 4 ml. of 1% Lidocaine without Epinephrine. The Tuohy needle was inserted into the epidural space using a paramedian approach.   The epidural space was localized using loss of resistance along with counter oblique bi-planar fluoroscopic views.  After negative aspirate for air, blood, and CSF, a 2 ml. volume of Isovue-250 was injected into the epidural space and the flow of contrast was observed. Radiographs were obtained for documentation purposes.    The injectate was administered into the level noted above.   Additional Comments:  The patient tolerated the procedure well Dressing: 2 x 2 sterile gauze and Band-Aid    Post-procedure details: Patient was observed during the procedure. Post-procedure instructions were reviewed.  Patient left the clinic in stable condition.   Clinical History: No specialty comments available.     Objective:  VS:  HT:    WT:   BMI:     BP:(!) 145/98  HR:93bpm  TEMP: ( )  RESP:  Physical Exam Vitals and nursing note reviewed.  Constitutional:      General: He is not in acute  distress.    Appearance: Normal appearance. He is not ill-appearing.  HENT:     Head: Normocephalic and atraumatic.     Right Ear: External ear normal.     Left Ear: External ear normal.     Nose: No congestion.  Eyes:     Extraocular Movements: Extraocular movements intact.  Cardiovascular:     Rate and Rhythm: Normal rate.     Pulses:  Normal pulses.  Pulmonary:     Effort: Pulmonary effort is normal. No respiratory distress.  Abdominal:     General: There is no distension.     Palpations: Abdomen is soft.  Musculoskeletal:        General: No tenderness or signs of injury.     Cervical back: Neck supple.     Right lower leg: No edema.     Left lower leg: No edema.     Comments: Patient has good distal strength without clonus.  Skin:    Findings: No erythema or rash.  Neurological:     General: No focal deficit present.     Mental Status: He is alert and oriented to person, place, and time.     Sensory: No sensory deficit.     Motor: No weakness or abnormal muscle tone.     Coordination: Coordination normal.  Psychiatric:        Mood and Affect: Mood normal.        Behavior: Behavior normal.      Imaging: No results found.

## 2022-02-18 NOTE — Procedures (Signed)
Lumbar Epidural Steroid Injection - Interlaminar Approach with Fluoroscopic Guidance  Patient: Alex [name]      Date of Birth: 09-30-1960 MRN: [id number] PCP: Dettinger, Elige Radon, MD      Visit Date: 02/08/2022   Universal Protocol:     Consent Given By: the patient  Position: PRONE  Additional Comments: Vital signs were monitored before and after the procedure. Patient was prepped and draped in the usual sterile fashion. The correct patient, procedure, and site was verified.   Injection Procedure Details:   Procedure diagnoses: Lumbar radiculopathy [M54.16]   Meds Administered:  Meds ordered this encounter  Medications   methylPREDNISolone acetate (DEPO-MEDROL) injection 80 mg     Laterality: Left  Location/Site:  L5-S1  Needle: 3.5 in., 20 ga. Tuohy  Needle Placement: Paramedian epidural  Findings:   -Comments: Excellent flow of contrast into the epidural space.  Procedure Details: Using a paramedian approach from the side mentioned above, the region overlying the inferior lamina was localized under fluoroscopic visualization and the soft tissues overlying this structure were infiltrated with 4 ml. of 1% Lidocaine without Epinephrine. The Tuohy needle was inserted into the epidural space using a paramedian approach.   The epidural space was localized using loss of resistance along with counter oblique bi-planar fluoroscopic views.  After negative aspirate for air, blood, and CSF, a 2 ml. volume of Isovue-250 was injected into the epidural space and the flow of contrast was observed. Radiographs were obtained for documentation purposes.    The injectate was administered into the level noted above.   Additional Comments:  The patient tolerated the procedure well Dressing: 2 x 2 sterile gauze and Band-Aid    Post-procedure details: Patient was observed during the procedure. Post-procedure instructions were reviewed.  Patient left the clinic in stable  condition.

## 2022-02-22 ENCOUNTER — Other Ambulatory Visit: Payer: Self-pay

## 2022-02-22 ENCOUNTER — Emergency Department (HOSPITAL_COMMUNITY): Payer: Medicare HMO

## 2022-02-22 ENCOUNTER — Emergency Department (HOSPITAL_COMMUNITY)
Admission: EM | Admit: 2022-02-22 | Discharge: 2022-02-22 | Disposition: A | Discharge status: Home / Self Care | Payer: Medicare HMO | Attending: Student | Admitting: Student

## 2022-02-22 ENCOUNTER — Encounter (HOSPITAL_COMMUNITY): Payer: Self-pay

## 2022-02-22 DIAGNOSIS — W28XXXA Contact with powered lawn mower, initial encounter: Secondary | ICD-10-CM | POA: Diagnosis not present

## 2022-02-22 DIAGNOSIS — Z96651 Presence of right artificial knee joint: Secondary | ICD-10-CM | POA: Diagnosis not present

## 2022-02-22 DIAGNOSIS — M25561 Pain in right knee: Secondary | ICD-10-CM | POA: Diagnosis not present

## 2022-02-22 DIAGNOSIS — Z794 Long term (current) use of insulin: Secondary | ICD-10-CM | POA: Diagnosis not present

## 2022-02-22 DIAGNOSIS — Y9389 Activity, other specified: Secondary | ICD-10-CM | POA: Diagnosis not present

## 2022-02-22 DIAGNOSIS — R609 Edema, unspecified: Secondary | ICD-10-CM | POA: Diagnosis not present

## 2022-02-22 MED ORDER — HYDROMORPHONE HCL 1 MG/ML IJ SOLN
1.0000 mg | Freq: Once | INTRAMUSCULAR | Status: AC
  Administered 2022-02-22: 1 mg via INTRAMUSCULAR
  Filled 2022-02-22: qty 1

## 2022-02-22 NOTE — ED Provider Notes (Signed)
[facility] EMERGENCY DEPARTMENT Provider Note   CSN: [id number] Arrival date & time: 02/22/22  1900     History Chief Complaint  Patient presents with   Knee Pain    Alex [name] is a 61 y.o. male patient who presents to the emergency department with right knee pain.  Patient states that he was mowing his neighbors lawn when the lawnmower hit a piece of rock landscaping and turned on him and he fell off.  He states that his left knee stayed on the lawnmower and his right knee kept going on the ground he did a full split.  Since then he has been unable to bear a lot of weight on the right knee secondary to pain.  He does have a history of total knee arthroplasty on the right.   Knee Pain      Home Medications Prior to Admission medications   Medication Sig Start Date End Date Taking? Authorizing Provider  amLODipine (NORVASC) 5 MG tablet Take 2 tablets (10 mg total) by mouth daily. 12/08/21   Dettinger, Elige Radon, MD  cyclobenzaprine (FLEXERIL) 10 MG tablet Take 1 tablet (10 mg total) by mouth 3 (three) times daily as needed for muscle spasms. 12/14/20   Kathryne Hitch, MD  diazepam (VALIUM) 5 MG tablet Take 1 tablet (5 mg total) by mouth daily as needed for anxiety. 09/06/21   Kathryne Hitch, MD  diclofenac (VOLTAREN) 75 MG EC tablet Take 1 tablet (75 mg total) by mouth 2 (two) times daily. 12/08/21   Dettinger, Elige Radon, MD  diclofenac Sodium (VOLTAREN) 1 % GEL Apply 2 g topically 4 (four) times daily. 12/08/21   Dettinger, Elige Radon, MD  dicyclomine (BENTYL) 10 MG capsule Take 1 capsule (10 mg total) by mouth 4 (four) times daily -  before meals and at bedtime. 12/08/21   Dettinger, Elige Radon, MD  fluticasone (FLONASE) 50 MCG/ACT nasal spray Place 1 spray into both nostrils 2 (two) times daily as needed for allergies or rhinitis. 09/06/21   Dettinger, Elige Radon, MD  HYDROcodone-acetaminophen (NORCO) 7.5-325 MG tablet Take 1 tablet by mouth every 6 (six) hours as needed for  moderate pain. 02/15/22 03/17/22  Fanny Dance, MD  ketoconazole (NIZORAL) 2 % shampoo Leave in for 10 minutes then rinse 2 times per week for up to 4 weeks 12/08/21   Dettinger, Elige Radon, MD  lisinopril (ZESTRIL) 40 MG tablet Take 1 tablet (40 mg total) by mouth daily. 09/01/21   Dettinger, Elige Radon, MD  nabumetone (RELAFEN) 750 MG tablet Take 1 tablet (750 mg total) by mouth 2 (two) times daily as needed. 10/26/20   Kathryne Hitch, MD  pantoprazole (PROTONIX) 40 MG tablet Take 1 tablet (40 mg total) by mouth 2 (two) times daily before a meal. 09/01/21   Dettinger, Elige Radon, MD  rosuvastatin (CRESTOR) 10 MG tablet Take 1 tablet (10 mg total) by mouth daily. 09/07/21   Dettinger, Elige Radon, MD  Semaglutide, 2 MG/DOSE, 8 MG/3ML SOPN Inject 2 mg as directed once a week. 11/09/21   Dettinger, Elige Radon, MD  tiZANidine (ZANAFLEX) 4 MG tablet Take 1 tablet (4 mg total) by mouth every 8 (eight) hours as needed for muscle spasms. 10/26/20   Kathryne Hitch, MD      Allergies    Morphine and related, Buprenorphine hcl-naloxone hcl, and Codeine    Review of Systems   Review of Systems  All other systems reviewed and are negative.   Physical Exam Updated  Vital Signs BP 120/62   Pulse 80   Temp 97.7 F (36.5 C)   Resp 18   Ht 5\' 6"  (1.676 m)   Wt 95.3 kg   SpO2 99%   BMI 33.89 kg/m  Physical Exam Vitals and nursing note reviewed.  Constitutional:      Appearance: Normal appearance.  HENT:     Head: Normocephalic and atraumatic.  Eyes:     General:        Right eye: No discharge.        Left eye: No discharge.     Conjunctiva/sclera: Conjunctivae normal.  Pulmonary:     Effort: Pulmonary effort is normal.  Musculoskeletal:     Comments: Moderate knee swelling.  Well-healed vertical incision from knee arthroplasty.  No significant point tenderness over the knee.  Skin:    General: Skin is warm and dry.     Findings: No rash.  Neurological:     General: No focal deficit  present.     Mental Status: He is alert.  Psychiatric:        Mood and Affect: Mood normal.        Behavior: Behavior normal.     ED Results / Procedures / Treatments   Labs (all labs ordered are listed, but only abnormal results are displayed) Labs Reviewed - No data to display  EKG None  Radiology DG Knee Complete 4 Views Right  Result Date: 02/22/2022 CLINICAL DATA:  Right knee pain following fall. EXAM: RIGHT KNEE - COMPLETE 4+ VIEW COMPARISON:  None Available. FINDINGS: Total knee arthroplasty changes are noted. No acute fracture or dislocation. No evidence of hardware loosening. There is a moderate joint effusion. Vascular calcifications are present in the soft tissues. IMPRESSION: 1. Status post total knee arthroplasty. No evidence of acute fracture or hardware loosening. 2. Moderate joint effusion. Electronically Signed   By: 02/24/2022 M.D.   On: 02/22/2022 20:50    Procedures Procedures    Medications Ordered in ED Medications  HYDROmorphone (DILAUDID) injection 1 mg (1 mg Intramuscular Given 02/22/22 2139)    ED Course/ Medical Decision Making/ A&P                           Medical Decision Making Alex [name] is a 61 y.o. male patient who presents to the emergency department today for further evaluation of right knee pain imaging was done in triage.  I personally interpreted this study.  No evidence of acute fracture.  There is a decent sized joint effusion.  I will give the patient a knee brace give him some pain medication and have him follow-up with orthopedics.  Patient amenable this plan.  He is safe for discharge.   Amount and/or Complexity of Data Reviewed Radiology: ordered.  Risk Prescription drug management.   Final Clinical Impression(s) / ED Diagnoses Final diagnoses:  Acute pain of right knee    Rx / DC Orders ED Discharge Orders     None         77 02/22/22 2345    02/24/22, MD 02/23/22 [phone]

## 2022-02-22 NOTE — Discharge Instructions (Signed)
Please use brace as needed for knee pain.  I would like for you to follow-up with your orthopedist if pain does not get better.  Please return to the emergency room for any worsening symptoms you might have.

## 2022-02-22 NOTE — ED Triage Notes (Signed)
Pt presents to ED from EMS with c/o right knee pain that started after falling off lawn mower today. Pt has had two total knee replacements to the right knee in 2020. Pt says he was on lawn mower and hit a rock which half way threw him off lawn mower sort of in a split position with left leg on mower and right leg on ground. Pt has right knee elevated and wrapped with ice by EMS. Pulses intact per EMS.

## 2022-02-27 ENCOUNTER — Telehealth: Payer: Self-pay

## 2022-02-27 ENCOUNTER — Telehealth: Payer: Self-pay | Admitting: Pharmacist

## 2022-02-27 NOTE — Telephone Encounter (Signed)
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 (Please see ED report). Per patient he needs a stronger pain medication for the right knee pain.   Patient has been advised to follow up with Dr. Magnus Ivan on tomorrow. Let him know about the pain contract here. If Dr. Magnus Ivan does prescribes pain medication, inform Dr.Stridelman.  But take his current pain medication only as prescribed.   Please advise or prescribe. Thank you.

## 2022-02-27 NOTE — Telephone Encounter (Signed)
Pt made aware and will try to come by today before 4:30pm to pick up.

## 2022-02-27 NOTE — Telephone Encounter (Signed)
Please let patient know:   Ozempic patient assistance supply is ready for pick up Patient will be taking 2mg  sq weekly #4 boxes/4 month supply

## 2022-02-28 ENCOUNTER — Ambulatory Visit (INDEPENDENT_AMBULATORY_CARE_PROVIDER_SITE_OTHER): Payer: Medicare HMO | Admitting: Orthopaedic Surgery

## 2022-02-28 ENCOUNTER — Other Ambulatory Visit: Payer: Self-pay | Admitting: Orthopaedic Surgery

## 2022-02-28 DIAGNOSIS — Z96651 Presence of right artificial knee joint: Secondary | ICD-10-CM

## 2022-02-28 DIAGNOSIS — M25561 Pain in right knee: Secondary | ICD-10-CM

## 2022-02-28 MED ORDER — HYDROCODONE-ACETAMINOPHEN 10-325 MG PO TABS: 1.0000 | ORAL_TABLET | Freq: Four times a day (QID) | ORAL | 0 refills | Status: DC | PRN

## 2022-02-28 MED ORDER — OXYCODONE HCL 5 MG PO TABS: 5.0000 mg | ORAL_TABLET | Freq: Four times a day (QID) | ORAL | 0 refills | Status: DC | PRN

## 2022-02-28 NOTE — Progress Notes (Signed)
The patient is well-known to me.  He has a history of a revision arthroplasty of the right total knee that had failed.  [place]  We revised this to a revision arthroplasty in 2020.  He is in chronic pain management but sustained a significant injury to his right knee recently when he was thrown off of the lawn more after coming around a tree awkwardly.  He went to the [facility] emergency room was found to have significant knee swelling of the right knee.  He is ambulate with a cane and has a knee brace.  Examination of his right knee shows that his leg is stable.  The swelling has decreased.  I did review the x-rays of his right knee and it did show an effusion and swelling from the day of his injury but the revision arthroplasty components are intact and stable.  I did give him a note to give to his chronic pain specialist that I will provide 20 oxycodones to his pharmacy to help with the acute pain.  He knows not to take the hydrocodone that he is on chronically until he is through with these oxycodone.  He can continue ice and elevation and work on quad strengthening exercises and offload that knee with a cane.  I gave him reassurance that the components are intact.  Follow-up is as needed.

## 2022-03-05 ENCOUNTER — Telehealth: Payer: Self-pay

## 2022-03-05 NOTE — Telephone Encounter (Signed)
Patient calling in for Hydrocodone refill

## 2022-03-06 ENCOUNTER — Ambulatory Visit: Payer: Medicare HMO | Admitting: Orthopaedic Surgery

## 2022-03-07 ENCOUNTER — Ambulatory Visit (INDEPENDENT_AMBULATORY_CARE_PROVIDER_SITE_OTHER): Payer: Medicare HMO

## 2022-03-07 ENCOUNTER — Encounter: Payer: Self-pay | Admitting: Orthopaedic Surgery

## 2022-03-07 ENCOUNTER — Ambulatory Visit (INDEPENDENT_AMBULATORY_CARE_PROVIDER_SITE_OTHER): Payer: Medicare HMO | Admitting: Orthopaedic Surgery

## 2022-03-07 VITALS — BP 130/75 | HR 85 | Ht 66.0 in | Wt 209.5 lb

## 2022-03-07 DIAGNOSIS — M545 Low back pain, unspecified: Secondary | ICD-10-CM

## 2022-03-07 DIAGNOSIS — G8929 Other chronic pain: Secondary | ICD-10-CM

## 2022-03-07 DIAGNOSIS — M5459 Other low back pain: Secondary | ICD-10-CM | POA: Diagnosis not present

## 2022-03-07 NOTE — Telephone Encounter (Signed)
Spoke w/ patient he is aware and had his days mixed up

## 2022-03-07 NOTE — Progress Notes (Addendum)
Office Visit Note   Patient: Alex [name]           Date of Birth: 1961-06-05           MRN: [id number] Visit Date: 03/07/2022              Requested by: Dettinger, Elige Radon, MD [address] [place],  [place] [address] PCP: Dettinger, Elige Radon, MD   Assessment & Plan: Visit Diagnoses:  1. Chronic bilateral low back pain, unspecified whether sciatica present     Plan: Reviewed MRI scan showed significant facet arthritis at L4-5 and L5-S1 without significant central compression.  He does have some severe left neuroforaminal narrowing which could be responsible for symptoms.  Patient's SI joint fusion appears stable with no radiolucent areas around the 3 fusion devices.  No hip osteoarthritis.  We will set him up for a left foraminal injection were MRI scan shows he has severe foraminal narrowing left L5-S1.  He can follow-up with me after the epidural.  He states he would like to have Dr. Alvester Morin do the injection.  Follow-Up Instructions: No follow-ups on file.   Orders:  Orders Placed This Encounter  Procedures   XR Lumbar Spine 2-3 Views   XR SI Joint 1-2 Views   XR Pelvis 1-2 Views   Ambulatory referral to Physical Medicine Rehab   No orders of the defined types were placed in this encounter.     Procedures: No procedures performed   Clinical Data: No additional findings.   Subjective: Chief Complaint  Patient presents with   Lower Back - Pain    HPI 61 year old male states he was mowing the neighbors yard using the neighbors tractor instead of his own riding lawnmower.  Patient had episode where he was thrown off the lawnmower after coming around a tree awkwardly.  He had problems with his knee where he had revision knee arthroplasty saw Dr. Magnus Ivan.  Is also complained of back pain.  Patient is on pain management and gets hydrocodone 10/325 averaging 6 and 90 tablets/month.  He has been on chronic pain management greater than a year.  Patient is amatory with cane  additional problems with hypertension diabetes type 2 hyperlipidemia disc degeneration.  Knee surgery with revision surgery.  Patient had previous left SI joint fusion done elsewhere.  History of old ACL tear.  Previous cervical fusion C3-C5.  Patient states every time he rolls over at night pain wakes him up with shooting pain down his left leg.  Radiates down to his ankle.  When he sticks his leg up on coffee table he feels pain that radiates down his leg hurts in the sciatic notch and the trochanter down the posterior lateral thigh into the lateral calf.  Previous MRI scan shows severe foraminal stenosis left L5-S1.  He does have facet arthropathy and MRI report is listed below.  Review of Systems negative fever chills no associated bowel or bladder symptoms.  All the systems are noncontributory HPI.   Objective: Vital Signs: BP 130/75   Pulse 85   Ht 5\' 6"  (1.676 m)   Wt 209 lb 8 oz (95 kg)   BMI 33.81 kg/m   Physical Exam Constitutional:      Appearance: He is well-developed.  HENT:     Head: Normocephalic and atraumatic.     Right Ear: External ear normal.     Left Ear: External ear normal.  Eyes:     Pupils: Pupils are equal, round, and reactive  to light.  Neck:     Thyroid: No thyromegaly.     Trachea: No tracheal deviation.  Cardiovascular:     Rate and Rhythm: Normal rate.  Pulmonary:     Effort: Pulmonary effort is normal.     Breath sounds: No wheezing.  Abdominal:     General: Bowel sounds are normal.     Palpations: Abdomen is soft.  Musculoskeletal:     Cervical back: Neck supple.  Skin:    General: Skin is warm and dry.     Capillary Refill: Capillary refill takes less than 2 seconds.  Neurological:     Mental Status: He is alert and oriented to person, place, and time.  Psychiatric:        Behavior: Behavior normal.        Thought Content: Thought content normal.        Judgment: Judgment normal.     Ortho Exam negative logroll the hip some tenderness  of the trochanter mild tenderness of the sciatic notch negative logroll the left hip.  Well-healed midline incision right knee.  Patient ambulates with a cane.  Specialty Comments:  No specialty comments available.  Imaging: Narrative & Impression  CLINICAL DATA:  Lumbar radiculopathy, symptoms persist with > 6 wks treatment. Low back pain, prior surgery, new symptoms.   EXAM: MRI LUMBAR SPINE WITHOUT CONTRAST   TECHNIQUE: Multiplanar, multisequence MR imaging of the lumbar spine was performed. No intravenous contrast was administered.   COMPARISON:  Radiographs June 30, 2021.   FINDINGS: Segmentation:  Standard.   Alignment:  Trace anterolisthesis of L5 over S1.   Vertebrae: No acute fracture, evidence of discitis, or bone lesion. Chronic superior endplate fracture with associated Schmorl node at T12 with approximately 50% height loss anteriorly. No retropulsion. Hemangiomas at L1, L3 and S2. Postsurgical changes from left sacroiliac joint fixation.   Conus medullaris and cauda equina: Conus extends to the L1 level. Conus and cauda equina appear normal.   Paraspinal and other soft tissues: Negative.   Disc levels:   T12-L1: No spinal canal or neural foraminal stenosis.   L1-2: Loss of disc height, shallow disc bulge with superimposed central disc protrusion mild facet degenerative changes resulting in mild spinal canal stenosis. No significant neural foraminal narrowing.   L2-3: No spinal canal or neural foraminal stenosis.   L3-4: Shallow disc bulge and mild facet degenerative changes without significant spinal canal or neural foraminal stenosis.   L4-5: Hypertrophic facet degenerative changes with bilateral joint effusion with articular process and periarticular soft tissue edema, left greater than right, suggesting active osteoarthritis. No significant spinal canal or neural foraminal stenosis.   L5-S1: Disc bulge/disc uncovering. Prominent hypertrophic  facet degenerative changes with right-sided joint effusion, articular process and periarticular soft tissue edema, suggesting active osteoarthritis. Moderate right and severe left neural foraminal narrowing.   IMPRESSION: 1. Degenerative changes of the lumbar spine, more pronounced at the level of the facet joints at L4-5 and L5-S1 where there is evidence of active osteoarthritis. 2. Moderate right and severe left neural foraminal narrowing at L5-S1.     Electronically Signed   By: Baldemar Lenis M.D.   On: 07/27/2021 11:21    Result Narrative  AP pelvis and right and left oblique show sacroiliac fusion on the left  with 3 devices transversing SI joint.  Sacral foramina are intact.   Impression: Post left SI joint fusion.  No loosening or subsidence.   PMFS History: Patient Active Problem List  Diagnosis Date Noted   Hyperlipidemia associated with type 2 diabetes mellitus (HCC) 06/23/2020   Status post revision of total replacement of right knee 04/10/2019   Loose right total knee arthroplasty (HCC) 03/09/2019   Neuropathic pain 01/01/2019   Cervical spine degeneration 12/30/2018   S/P cervical spinal fusion 03/27/2018   Morbid obesity (HCC) 02/21/2018   Recurrent nephrolithiasis 09/18/2017   Diabetes mellitus type 2 in obese (HCC) 03/28/2017   Hypertension associated with diabetes (HCC) 08/25/2016   Past Medical History:  Diagnosis Date   Diabetes mellitus without complication (HCC)    History of kidney stones    removed 5 months ago   Hypertension    PONV (postoperative nausea and vomiting)    AGITATION after anesthesia as well   Sleep apnea    NO CPAP    Family History  Problem Relation Age of Onset   Cancer Mother 25       small intestinal   Diabetes Father    Hypertension Father    Heart attack Father    Early death Sister    Brain cancer Brother    Colon cancer Neg Hx     Past Surgical History:  Procedure Laterality Date   ANTERIOR  CERVICAL DECOMP/DISCECTOMY FUSION N/A 03/27/2018   Procedure: ANTERIOR CERVICAL DECOMPRESSION/DISCECTOMY FUSION C3-5;  Surgeon: Venita Lick, MD;  Location: [facility] OR;  Service: Orthopedics;  Laterality: N/A;  3.5 hrs   arm surgery Right    x2 involving tendons/ulnar nerve   BACK SURGERY     BIOPSY  10/17/2020   Procedure: BIOPSY;  Surgeon: Lanelle Bal, DO;  Location: [facility] SU[facility];  Service: Endoscopy;;   CARPAL TUNNEL RELEASE Right    clavical surgery     ESOPHAGOGASTRODUODENOSCOPY (EGD) WITH PROPOFOL N/A 10/17/2020   Procedure: ESOPHAGOGASTRODUODENOSCOPY (EGD) WITH PROPOFOL;  Surgeon: Lanelle Bal, DO;  Location: [facility];  Service: Endoscopy;  Laterality: N/A;  PM   EYE SURGERY Bilateral    glaucoma   HERNIA REPAIR     JOINT REPLACEMENT Right 06/2011   TOTAL KNEE REVISION Right 04/10/2019   Procedure: RIGHT TOTAL KNEE REVISION;  Surgeon: Kathryne Hitch, MD;  Location: [facility] ORS;  Service: Orthopedics;  Laterality: Right;   TRICEPS TENDON REPAIR     Social History   Occupational History   Not on file  Tobacco Use   Smoking status: Never   Smokeless tobacco: Current    Types: Chew  Vaping Use   Vaping Use: Never used  Substance and Sexual Activity   Alcohol use: Not Currently    Comment: Quit EtOH 20 years ago   Drug use: No   Sexual activity: Not on file

## 2022-03-16 ENCOUNTER — Telehealth: Payer: Self-pay

## 2022-03-16 NOTE — Telephone Encounter (Signed)
Patient called and stated he has to have back surgery from Dr. [name]. He wanted to know if he has to take his regular pain medication or can he take a prescription from Dr. [name]. Spoke with Dr. Natale Lay and he stated he just needs to be notified by Dr. [name] that he will prescribe post operative medication. Called patient to inform of information

## 2022-03-19 ENCOUNTER — Telehealth: Payer: Self-pay | Admitting: Family Medicine

## 2022-03-19 ENCOUNTER — Encounter: Payer: Self-pay | Admitting: Family Medicine

## 2022-03-19 ENCOUNTER — Ambulatory Visit (INDEPENDENT_AMBULATORY_CARE_PROVIDER_SITE_OTHER): Payer: Medicare HMO | Admitting: Family Medicine

## 2022-03-19 ENCOUNTER — Telehealth: Payer: Self-pay | Admitting: *Deleted

## 2022-03-19 VITALS — BP 133/76 | HR 66 | Ht 66.0 in | Wt 212.0 lb

## 2022-03-19 DIAGNOSIS — E1159 Type 2 diabetes mellitus with other circulatory complications: Secondary | ICD-10-CM | POA: Diagnosis not present

## 2022-03-19 DIAGNOSIS — E785 Hyperlipidemia, unspecified: Secondary | ICD-10-CM | POA: Diagnosis not present

## 2022-03-19 DIAGNOSIS — I152 Hypertension secondary to endocrine disorders: Secondary | ICD-10-CM

## 2022-03-19 DIAGNOSIS — E669 Obesity, unspecified: Secondary | ICD-10-CM | POA: Diagnosis not present

## 2022-03-19 DIAGNOSIS — M4722 Other spondylosis with radiculopathy, cervical region: Secondary | ICD-10-CM | POA: Diagnosis not present

## 2022-03-19 DIAGNOSIS — E1169 Type 2 diabetes mellitus with other specified complication: Secondary | ICD-10-CM | POA: Diagnosis not present

## 2022-03-19 LAB — BAYER DCA HB A1C WAIVED: HB A1C (BAYER DCA - WAIVED): 5.7 % — ABNORMAL HIGH (ref 4.8–5.6)

## 2022-03-19 MED ORDER — OYSTER SHELL CALCIUM/D3 500-5 MG-MCG PO TABS: 1.0000 | ORAL_TABLET | Freq: Every day | ORAL | 3 refills | Status: DC

## 2022-03-19 MED ORDER — HYDROCODONE-ACETAMINOPHEN 7.5-325 MG PO TABS
1.0000 | ORAL_TABLET | Freq: Four times a day (QID) | ORAL | 0 refills | Status: DC | PRN
Start: 2022-03-19 — End: 2022-04-17

## 2022-03-19 NOTE — Telephone Encounter (Signed)
Alex [name] called asking why we had not refilled his pain medication.   He had an accident and called our office on 8/22 and was told to see his orthopedic since Dr Natale Lay was not in the office , and if he got treatment he was to let us know. He was seen by [facility] and got meds from [place] and [place] per the PMP.  02/28/2022 Hydrocodone-Acetamin 10-325 Mg 30.00 7 Pe Whi   Mad (4601)  02/28/2022 Oxycodone Hcl (Ir) 5 Mg Tablet  20.00 5 Ch Bla              [facility]  He was not to take the medication from our office and their office. But he reports he took what he felt like he needed.I explained that he can not take the amount of medication he feels he needs.   He was argumentative when I questioned about the phone call on 03/16/22 saying he had to have surgery by [name].  He says he is not having surgery but an ESI with [name] and will have surgery if that does not work.  That was not the agreement about letting them Rx pain meds.   He wants an appt to come down here and talk with Dr Natale Lay.  He was angry and brought up the "piss test" he had to do running him all over the place. He insists his documentation from his other doctors is sufficient to explain why he took the medication from the orthopedics because he needed stronger medication. I informed him he signed a contract with our office and taking medications on his own and from other physicians is a violation of that contract.  He will receive the refill today on the original Rx by Dr Murvin Natal and if he takes another Rx from another provider OR takes more than prescribed, he will be discharged from the clinic. He will receive the formal warning letter documenting this information.

## 2022-03-19 NOTE — Progress Notes (Signed)
BP 133/76   Pulse 66   Ht 5\' 6"  (1.676 m)   Wt 212 lb (96.2 kg)   SpO2 100%   BMI 34.22 kg/m    Subjective:   Patient ID: , male    DOB: 1961/07/07, 61 y.o.   MRN: [id number]  HPI: Alex [name] is a 61 y.o. male presenting on 03/19/2022 for Medical Management of Chronic Issues, Diabetes, and Back Pain (Would like for Dr. 05/19/2022 to manage pain instead of going to pain management)   HPI Type 2 diabetes mellitus Patient comes in today for recheck of his diabetes. Patient has been currently taking Ozempic. Patient is currently on an ACE inhibitor/ARB. Patient has not seen an ophthalmologist this year. Patient denies any issues with their feet. The symptom started onset as an adult hypertension and hyperlipidemia ARE RELATED TO DM   Hypertension Patient is currently on lisinopril and amlodipine, and their blood pressure today is 133/76. Patient denies any lightheadedness or dizziness. Patient denies headaches, blurred vision, chest pains, shortness of breath, or weakness. Denies any side effects from medication and is content with current medication.   Hyperlipidemia Patient is coming in for recheck of his hyperlipidemia. The patient is currently taking Crestor. They deny any issues with myalgias or history of liver damage from it. They deny any focal numbness or weakness or chest pain.   Relevant past medical, surgical, family and social history reviewed and updated as indicated. Interim medical history since our last visit reviewed. Allergies and medications reviewed and updated.  Review of Systems  Constitutional:  Negative for chills and fever.  Eyes:  Negative for visual disturbance.  Respiratory:  Negative for shortness of breath and wheezing.   Cardiovascular:  Negative for chest pain and leg swelling.  Musculoskeletal:  Positive for arthralgias, back pain and gait problem.  Skin:  Negative for rash.  Neurological:  Negative for dizziness, weakness and  light-headedness.  All other systems reviewed and are negative.   Per HPI unless specifically indicated above   Allergies as of 03/19/2022       Reactions   Morphine And Related Nausea Only   Buprenorphine Hcl-naloxone Hcl Nausea Only   Codeine Nausea Only, Other (See Comments)   per pt, "felt weird in my head"        Medication List        Accurate as of March 19, 2022  9:03 AM. If you have any questions, ask your nurse or doctor.          amLODipine 5 MG tablet Commonly known as: NORVASC Take 2 tablets (10 mg total) by mouth daily.   calcium-vitamin D 500-5 MG-MCG tablet Commonly known as: OSCAL WITH D Take 1 tablet by mouth daily with breakfast. Started by: March 21, 2022 Bingham Millette, MD   cyclobenzaprine 10 MG tablet Commonly known as: FLEXERIL Take 1 tablet (10 mg total) by mouth 3 (three) times daily as needed for muscle spasms.   diazepam 5 MG tablet Commonly known as: VALIUM Take 1 tablet (5 mg total) by mouth daily as needed for anxiety.   diclofenac 75 MG EC tablet Commonly known as: VOLTAREN Take 1 tablet (75 mg total) by mouth 2 (two) times daily.   diclofenac Sodium 1 % Gel Commonly known as: Voltaren Apply 2 g topically 4 (four) times daily.   dicyclomine 10 MG capsule Commonly known as: Bentyl Take 1 capsule (10 mg total) by mouth 4 (four) times daily -  before meals and at bedtime.  fluticasone 50 MCG/ACT nasal spray Commonly known as: FLONASE Place 1 spray into both nostrils 2 (two) times daily as needed for allergies or rhinitis.   HYDROcodone-acetaminophen 10-325 MG tablet Commonly known as: NORCO Take 1 tablet by mouth every 6 (six) hours as needed.   ketoconazole 2 % shampoo Commonly known as: NIZORAL Leave in for 10 minutes then rinse 2 times per week for up to 4 weeks   lisinopril 40 MG tablet Commonly known as: ZESTRIL Take 1 tablet (40 mg total) by mouth daily.   nabumetone 750 MG tablet Commonly known as: RELAFEN Take 1  tablet (750 mg total) by mouth 2 (two) times daily as needed.   oxyCODONE 5 MG immediate release tablet Commonly known as: Roxicodone Take 1 tablet (5 mg total) by mouth every 6 (six) hours as needed for severe pain.   pantoprazole 40 MG tablet Commonly known as: PROTONIX Take 1 tablet (40 mg total) by mouth 2 (two) times daily before a meal.   rosuvastatin 10 MG tablet Commonly known as: CRESTOR Take 1 tablet (10 mg total) by mouth daily.   Semaglutide (2 MG/DOSE) 8 MG/3ML Sopn Inject 2 mg as directed once a week.   tiZANidine 4 MG tablet Commonly known as: Zanaflex Take 1 tablet (4 mg total) by mouth every 8 (eight) hours as needed for muscle spasms.         Objective:   BP 133/76   Pulse 66   Ht 5\' 6"  (1.676 m)   Wt 212 lb (96.2 kg)   SpO2 100%   BMI 34.22 kg/m   Wt Readings from Last 3 Encounters:  03/19/22 212 lb (96.2 kg)  03/07/22 209 lb 8 oz (95 kg)  02/22/22 210 lb (95.3 kg)    Physical Exam Vitals and nursing note reviewed.  Constitutional:      General: He is not in acute distress.    Appearance: He is well-developed. He is not diaphoretic.  Eyes:     General: No scleral icterus.    Conjunctiva/sclera: Conjunctivae normal.  Neck:     Thyroid: No thyromegaly.  Cardiovascular:     Rate and Rhythm: Normal rate and regular rhythm.     Heart sounds: Normal heart sounds. No murmur heard. Pulmonary:     Effort: Pulmonary effort is normal. No respiratory distress.     Breath sounds: Normal breath sounds. No wheezing.  Musculoskeletal:     Cervical back: Neck supple.  Lymphadenopathy:     Cervical: No cervical adenopathy.  Skin:    General: Skin is warm and dry.     Findings: No rash.  Neurological:     Mental Status: He is alert and oriented to person, place, and time.     Coordination: Coordination normal.  Psychiatric:        Behavior: Behavior normal.       Assessment & Plan:   Problem List Items Addressed This Visit        Cardiovascular and Mediastinum   Hypertension associated with diabetes (HCC)     Endocrine   Diabetes mellitus type 2 in obese (HCC) - Primary   Relevant Orders   Bayer DCA Hb A1c Waived   Hyperlipidemia associated with type 2 diabetes mellitus (HCC)     Musculoskeletal and Integument   Cervical spine degeneration   Relevant Medications   calcium-vitamin D (OSCAL WITH D) 500-5 MG-MCG tablet  A1c looks good at 5.7, he is losing weight, continue current medicine  Blood pressure looks good  as well.  Patient sees Dr. Lysle Rubens for pain management.  Follow up plan: Return in about 3 months (around 06/18/2022), or if symptoms worsen or fail to improve, for Diabetes hypertension cholesterol.  Counseling provided for all of the vaccine components Orders Placed This Encounter  Procedures   Bayer DCA Hb A1c Waived    Arville Care, MD [facility] 03/19/2022, 9:03 AM

## 2022-03-19 NOTE — Addendum Note (Signed)
Addended by: Fanny Dance on: 03/19/2022 01:35 PM   Modules accepted: Orders

## 2022-03-19 NOTE — Telephone Encounter (Signed)
Tried calling pt. No answer and no vmail is set up.

## 2022-03-27 ENCOUNTER — Encounter: Payer: Self-pay | Admitting: Physical Medicine and Rehabilitation

## 2022-03-27 ENCOUNTER — Ambulatory Visit (INDEPENDENT_AMBULATORY_CARE_PROVIDER_SITE_OTHER): Payer: Medicare HMO | Admitting: Physical Medicine and Rehabilitation

## 2022-03-27 ENCOUNTER — Ambulatory Visit: Payer: Self-pay

## 2022-03-27 VITALS — BP 151/93 | HR 82

## 2022-03-27 DIAGNOSIS — M5416 Radiculopathy, lumbar region: Secondary | ICD-10-CM | POA: Diagnosis not present

## 2022-03-27 MED ORDER — METHYLPREDNISOLONE ACETATE 80 MG/ML IJ SUSP
80.0000 mg | Freq: Once | INTRAMUSCULAR | Status: AC
  Administered 2022-03-27: 80 mg

## 2022-03-27 NOTE — Patient Instructions (Signed)

## 2022-03-27 NOTE — Progress Notes (Signed)
Left sided lbp into buttocks and down to knee. Twisting cause a stabbing pain.   Numeric Pain Rating Scale and Functional Assessment Average Pain 6   In the last MONTH (on 0-10 scale) has pain interfered with the following?  1. General activity like being  able to carry out your everyday physical activities such as walking, climbing stairs, carrying groceries, or moving a chair?  Rating(10)   +Driver, -BT, -Dye Allergies.

## 2022-03-28 ENCOUNTER — Other Ambulatory Visit: Payer: Self-pay | Admitting: Family Medicine

## 2022-03-28 DIAGNOSIS — L219 Seborrheic dermatitis, unspecified: Secondary | ICD-10-CM

## 2022-03-28 NOTE — Telephone Encounter (Signed)
  Prescription Request  03/28/2022  Is this a "Controlled Substance" medicine? no  Have you seen your PCP in the last 2 weeks? 03/19/22  If YES, route message to pool  -  If NO, patient needs to be scheduled for appointment.  What is the name of the medication or equipment? ketoconazole (NIZORAL) 2 % shampoo [[id number]]    Have you contacted your pharmacy to request a refill? no   Which pharmacy would you like this sent to? [facility]   Patient notified that their request is being sent to the clinical staff for review and that they should receive a response within 2 business days.

## 2022-03-29 MED ORDER — KETOCONAZOLE 2 % EX SHAM: MEDICATED_SHAMPOO | CUTANEOUS | 1 refills | Status: DC

## 2022-03-29 NOTE — Telephone Encounter (Signed)
Pt aware refill sent to pharmacy 

## 2022-04-02 ENCOUNTER — Encounter: Payer: Medicare HMO | Attending: Physical Medicine & Rehabilitation | Admitting: Physical Medicine & Rehabilitation

## 2022-04-02 ENCOUNTER — Encounter: Payer: Self-pay | Admitting: Physical Medicine & Rehabilitation

## 2022-04-02 VITALS — BP 135/89 | HR 90 | Ht 66.0 in | Wt 216.4 lb

## 2022-04-02 DIAGNOSIS — M25562 Pain in left knee: Secondary | ICD-10-CM

## 2022-04-02 DIAGNOSIS — M5442 Lumbago with sciatica, left side: Secondary | ICD-10-CM | POA: Diagnosis not present

## 2022-04-02 DIAGNOSIS — F1210 Cannabis abuse, uncomplicated: Secondary | ICD-10-CM

## 2022-04-02 DIAGNOSIS — G8929 Other chronic pain: Secondary | ICD-10-CM | POA: Insufficient documentation

## 2022-04-02 DIAGNOSIS — M47816 Spondylosis without myelopathy or radiculopathy, lumbar region: Secondary | ICD-10-CM

## 2022-04-02 DIAGNOSIS — M25561 Pain in right knee: Secondary | ICD-10-CM

## 2022-04-02 NOTE — Progress Notes (Unsigned)
Subjective:    Patient ID: Alex [name], male    DOB: 08-17-1960, 61 y.o.   MRN: [id number]  HPI  HPI 61 year old male with past medical history of hypertension diabetes hyperlipidemia, right CTS, cervical spine degeneration status post cervical fusion C3-C5 2019 right total knee arthroplasty with revision in 2020 here for chronic pain.  He reports chronic back pain and knee pain for many years.  Pain is sharp and extension in his lower back. He has been back pain worse with extension as well as occasional shooting pain down his right leg. It was worsened a few weeks ago when he lifted a heavy item.  He uses Voltaren gel with improvement.  He was also prescribed hydrocodone which she reports using sparingly with benefit.  Flexeril tizanidine gabapentin and Lyrica did not help his pain.  Tramadol did not help the pain.  Tylenol and NSAIDs did not help.  He says he sometimes goes to the ER to help with pain treatment.  He has been working  with PT with some benefit.  He had a lumbar spinal epidural injection by Dr. Ernestina Patches August 22, 2021 with benefit but it did not last.  He is followed by Dr. Ninfa Linden orthopedics.  He was seen a couple years ago in 2021 by Dr. Posey Pronto [facility].   Interval History 04/02/22   Alex [name] is here for follow-up of his chronic back pain.  Patient reports he had worsening pain after he fell off a tractor.  He reports he did not suffer any major injuries however the pain in his right knee and lower back were worsened temporarily.  He was having pain shooting down his left leg to his ankle.  He has been wearing a knee brace on his right lower extremity, reports the swelling and pain has improved.  He was seen by orthopedics Dr. Ninfa Linden and Dr.  Lorin Mercy for his knee and lower back.  She had a lumbar ESI by Dr. Ernestina Patches 03/27/2022.  His pain has improved since having his ESI completed.  He used ibuprofen for short period of time.  He is stopped using ibuprofen and is now using Aleve  occasionally.  He is also attempting to lose weight and his PCP started him on a weight loss medication.  He is also using a TENS unit to help with his pain.  Denies any side effects with the hydrocodone 7.5 mg and reports it is helping his pain and allowing him to be more functional.  He says he is able to do more at home and in his yard while using this medication  Pain Inventory Average Pain 7 Pain Right Now 7 My pain is sharp, stabbing, and aching  In the last 24 hours, has pain interfered with the following? General activity 5 Relation with others 4 Enjoyment of life 0 What TIME of day is your pain at its worst? morning  and night Sleep (in general) Poor  Pain is worse with: walking, sitting, and some activites Pain improves with: medication Relief from Meds: 8  Family History  Problem Relation Age of Onset   Cancer Mother 50       small intestinal   Diabetes Father    Hypertension Father    Heart attack Father    Early death Sister    Brain cancer Brother    Colon cancer Neg Hx    Social History   Socioeconomic History   Marital status: Married    Spouse name: Not on file  Number of children: 2   Years of education: 12   Highest education level: 12th grade  Occupational History   Not on file  Tobacco Use   Smoking status: Never   Smokeless tobacco: Current    Types: Chew  Vaping Use   Vaping Use: Never used  Substance and Sexual Activity   Alcohol use: Not Currently    Comment: Quit EtOH 20 years ago   Drug use: No   Sexual activity: Not on file  Other Topics Concern   Not on file  Social History Narrative   Not on file   Social Determinants of Health   Financial Resource Strain: Not on file  Food Insecurity: Not on file  Transportation Needs: Not on file  Physical Activity: Not on file  Stress: Not on file  Social Connections: Not on file   Past Surgical History:  Procedure Laterality Date   ANTERIOR CERVICAL DECOMP/DISCECTOMY FUSION N/A  03/27/2018   Procedure: ANTERIOR CERVICAL DECOMPRESSION/DISCECTOMY FUSION C3-5;  Surgeon: Melina Schools, MD;  Location: [place];  Service: Orthopedics;  Laterality: N/A;  3.5 hrs   arm surgery Right    x2 involving tendons/ulnar nerve   BACK SURGERY     BIOPSY  10/17/2020   Procedure: BIOPSY;  Surgeon: Eloise Harman, DO;  [facility];  Service: Endoscopy;;   CARPAL TUNNEL RELEASE Right    clavical surgery     ESOPHAGOGASTRODUODENOSCOPY (EGD) WITH PROPOFOL N/A 10/17/2020   Procedure: ESOPHAGOGASTRODUODENOSCOPY (EGD) WITH PROPOFOL;  Surgeon: Eloise Harman, DO;  [facility];  Service: Endoscopy;  Laterality: N/A;  PM   EYE SURGERY Bilateral    glaucoma   HERNIA REPAIR     JOINT REPLACEMENT Right 06/2011   TOTAL KNEE REVISION Right 04/10/2019   Procedure: RIGHT TOTAL KNEE REVISION;  Surgeon: Mcarthur Rossetti, MD;  Location: [facility];  Service: Orthopedics;  Laterality: Right;   TRICEPS TENDON REPAIR     Past Surgical History:  Procedure Laterality Date   ANTERIOR CERVICAL DECOMP/DISCECTOMY FUSION N/A 03/27/2018   Procedure: ANTERIOR CERVICAL DECOMPRESSION/DISCECTOMY FUSION C3-5;  Surgeon: Melina Schools, MD;  Location: [place];  Service: Orthopedics;  Laterality: N/A;  3.5 hrs   arm surgery Right    x2 involving tendons/ulnar nerve   BACK SURGERY     BIOPSY  10/17/2020   Procedure: BIOPSY;  Surgeon: Eloise Harman, DO;  [facility];  Service: Endoscopy;;   CARPAL TUNNEL RELEASE Right    clavical surgery     ESOPHAGOGASTRODUODENOSCOPY (EGD) WITH PROPOFOL N/A 10/17/2020   Procedure: ESOPHAGOGASTRODUODENOSCOPY (EGD) WITH PROPOFOL;  Surgeon: Eloise Harman, DO;  [facility];  Service: Endoscopy;  Laterality: N/A;  PM   EYE SURGERY Bilateral    glaucoma   HERNIA REPAIR     JOINT REPLACEMENT Right 06/2011   TOTAL KNEE REVISION Right 04/10/2019   Procedure: RIGHT TOTAL KNEE REVISION;  Surgeon: Mcarthur Rossetti, MD;  Location: [facility];  Service: Orthopedics;  Laterality: Right;   TRICEPS TENDON REPAIR     Past Medical History:  Diagnosis Date   Diabetes mellitus without complication ([place])    History of kidney stones    removed 5 months ago   Hypertension    PONV (postoperative nausea and vomiting)    AGITATION after anesthesia as well   Sleep apnea    NO CPAP   BP 135/89   Pulse 90   Ht 5\' 6"  (1.676 m)   Wt  216 lb 6.4 oz (98.2 kg)   SpO2 96%   BMI 34.93 kg/m   Opioid Risk Score:   Fall Risk Score:  `1  Depression screen [facility] 2/9     03/19/2022    8:19 AM 02/13/2022   10:28 AM 12/18/2021   10:12 AM 12/08/2021    8:09 AM 11/17/2021    2:52 PM 09/01/2021    8:43 AM 07/14/2021    8:42 AM  Depression screen PH[facility] 2/9  Decreased Interest 2 0 1 1 3 1 1   Down, Depressed, Hopeless 1 0 1 0 1 1 1   PHQ - 2 Score 3 0 2 1 4 2 2   Altered sleeping 2   1 3 1 1   Tired, decreased energy 1   2 1 1 1   Change in appetite 1   1 1 1 1   Feeling bad or failure about yourself  0   3 3 1 1   Trouble concentrating 0   1 1 0 0  Moving slowly or fidgety/restless 0   0 0 0 0  Suicidal thoughts 0   1 1 0 0  PHQ-9 Score 7   10 14 6 6   Difficult doing work/chores Not difficult at all   Very difficult Very difficult  Not difficult at all      Review of Systems  Musculoskeletal:  Positive for back pain.       Right leg pain   All other systems reviewed and are negative.     Objective:   Physical Exam  Gen: no distress, normal appearing HEENT: oral mucosa pink and moist, NCAT Cardio: Reg rate Chest: normal effort, normal rate of breathing Abd: soft, non-distended Ext: no edema Psych: pleasant, normal affect Skin: intact Neuro: Alert, cranial nerves II through XII grossly intact, sensation intact in all 4 extremities coordination is normal no sensory deficits answers questions follows commands    Musculoskeletal:   + Lumbar paraspinal tenderness.   Facet loading positive Slump test negative Wearing a knee brace on his  right knee, mild effusion and tenderness noted Strength 5 out of 5 in bilateral upper extremities.  Strength also 5 out of 5 in the lower extremities with some pain limitation exception at the hip    L spine xray 03/07/22 Impression: Lumbar facet arthropathy previous left SI joint fusion.   Pelvis xray 03/07/22 Impression: Left SI joint fusion.  Normal right SI joint.  Knee xray Right 8/30 1. Status post total knee arthroplasty. No evidence of acute fracture or hardware loosening. 2. Moderate joint effusion.   Knee L MRI 11/15/21   IMPRESSION: 1. Complete tear of the anterior cruciate ligament without associated hemorrhage in the intercondylar notch or acute osseous findings, likely due to a remote tear (ACL deficient knee). 2. No evidence of meniscal tear. Probable septated ganglion adjacent to the anterior horn of the medial meniscus. 3. No acute ligamentous or osteochondral findings. 4. Minimal degenerative changes.   L Spine MRI 07/27/2021 Segmentation:  Standard.   Alignment:  Trace anterolisthesis of L5 over S1.   Vertebrae: No acute fracture, evidence of discitis, or bone lesion. Chronic superior endplate fracture with associated Schmorl node at T12 with approximately 50% height loss anteriorly. No retropulsion. Hemangiomas at L1, L3 and S2. Postsurgical changes from left sacroiliac joint fixation.   Conus medullaris and cauda equina: Conus extends to the L1 level. Conus and cauda equina appear normal.   Paraspinal and other soft tissues: Negative.   Disc levels:   T12-L1:  No spinal canal or neural foraminal stenosis.   L1-2: Loss of disc height, shallow disc bulge with superimposed central disc protrusion mild facet degenerative changes resulting in mild spinal canal stenosis. No significant neural foraminal narrowing.   L2-3: No spinal canal or neural foraminal stenosis.   L3-4: Shallow disc bulge and mild facet degenerative changes without significant  spinal canal or neural foraminal stenosis.   L4-5: Hypertrophic facet degenerative changes with bilateral joint effusion with articular process and periarticular soft tissue edema, left greater than right, suggesting active osteoarthritis. No significant spinal canal or neural foraminal stenosis.   L5-S1: Disc bulge/disc uncovering. Prominent hypertrophic facet degenerative changes with right-sided joint effusion, articular process and periarticular soft tissue edema, suggesting active osteoarthritis. Moderate right and severe left neural foraminal narrowing.   IMPRESSION: 1. Degenerative changes of the lumbar spine, more pronounced at the level of the facet joints at L4-5 and L5-S1 where there is evidence of active osteoarthritis. 2. Moderate right and severe left neural foraminal narrowing at L5-S1.      Assessment & Plan:    Lumbar spondylosis with multilevel degenerative changes of his facet joints and foraminal narrowing severe left moderate right at L5-S1, he has radicular symptoms on the left -Minimal improvement reported with ESI -Facet joint injections may be an option at later time -Poor results with duloxetine, elavil -S/p lumbar ESI last week by Dr Ernestina Patches with improvement -Will continue Norco 7.5 QID  Marijuana abuse  -He denies current use, continue to follow with [facility]   Bilateral knee pain with hx right total knee replacement -He follows with Dr. Ninfa Linden orthopedics -R knee pain after fall from tractor is improving

## 2022-04-04 ENCOUNTER — Encounter: Payer: Self-pay | Admitting: Physical Medicine & Rehabilitation

## 2022-04-04 NOTE — Progress Notes (Signed)
Alex [name] - 61 y.o. male MRN [id number]  Date of birth: December 24, 1960  Office Visit Note: Visit Date: 03/27/2022 PCP: Dettinger, Fransisca Kaufmann, MD Referred by: Dettinger, Fransisca Kaufmann, MD  Subjective: Chief Complaint  Patient presents with   Lower Back - Pain   HPI:  Alex [name] is a 61 y.o. male who comes in today at the request of Dr. Rodell Perna for planned Left L5-S1 Lumbar Transforaminal epidural steroid injection with fluoroscopic guidance.  The patient has failed conservative care including home exercise, medications, time and activity modification.  This injection will be diagnostic and hopefully therapeutic.  Please see requesting physician notes for further details and justification.   ROS Otherwise per HPI.  Assessment & Plan: Visit Diagnoses:    ICD-10-CM   1. Lumbar radiculopathy  M54.16 XR C-ARM NO REPORT    Epidural Steroid injection    methylPREDNISolone acetate (DEPO-MEDROL) injection 80 mg      Plan: No additional findings.   Meds & Orders:  Meds ordered this encounter  Medications   methylPREDNISolone acetate (DEPO-MEDROL) injection 80 mg    Orders Placed This Encounter  Procedures   XR C-ARM NO REPORT   Epidural Steroid injection    Follow-up: Return in about 2 weeks (around 04/10/2022) for Rodell Perna, MD.   Procedures: No procedures performed  Lumbosacral Transforaminal Epidural Steroid Injection - Sub-Pedicular Approach with Fluoroscopic Guidance  Patient: Alex [name]      Date of Birth: June 19, 1961 MRN: [id number] PCP: Dettinger, Fransisca Kaufmann, MD      Visit Date: 03/27/2022   Universal Protocol:    Date/Time: 03/27/2022  Consent Given By: the patient  Position: PRONE  Additional Comments: Vital signs were monitored before and after the procedure. Patient was prepped and draped in the usual sterile fashion. The correct patient, procedure, and site was verified.   Injection Procedure Details:   Procedure diagnoses: Lumbar radiculopathy  [M54.16]    Meds Administered:  Meds ordered this encounter  Medications   methylPREDNISolone acetate (DEPO-MEDROL) injection 80 mg    Laterality: Left  Location/Site: L5  Needle:5.0 in., 22 ga.  Short bevel or Quincke spinal needle  Needle Placement: Transforaminal  Findings:    -Comments: Excellent flow of contrast along the nerve, nerve root and into the epidural space.  Procedure Details: After squaring off the end-plates to get a true AP view, the C-arm was positioned so that an oblique view of the foramen as noted above was visualized. The target area is just inferior to the "nose of the scotty dog" or sub pedicular. The soft tissues overlying this structure were infiltrated with 2-3 ml. of 1% Lidocaine without Epinephrine.  The spinal needle was inserted toward the target using a "trajectory" view along the fluoroscope beam.  Under AP and lateral visualization, the needle was advanced so it did not puncture dura and was located close the 6 O'Clock position of the pedical in AP tracterory. Biplanar projections were used to confirm position. Aspiration was confirmed to be negative for CSF and/or blood. A 1-2 ml. volume of Isovue-250 was injected and flow of contrast was noted at each level. Radiographs were obtained for documentation purposes.   After attaining the desired flow of contrast documented above, a 0.5 to 1.0 ml test dose of 0.25% Marcaine was injected into each respective transforaminal space.  The patient was observed for 90 seconds post injection.  After no sensory deficits were reported, and normal lower extremity motor function was noted,   the above injectate  was administered so that equal amounts of the injectate were placed at each foramen (level) into the transforaminal epidural space.   Additional Comments:  The patient tolerated the procedure well Dressing: 2 x 2 sterile gauze and Band-Aid    Post-procedure details: Patient was observed during the  procedure. Post-procedure instructions were reviewed.  Patient left the clinic in stable condition.    Clinical History: No specialty comments available.     Objective:  VS:  HT:    WT:   BMI:     BP:(!) 151/93  HR:82bpm  TEMP: ( )  RESP:  Physical Exam Vitals and nursing note reviewed.  Constitutional:      General: He is not in acute distress.    Appearance: Normal appearance. He is not ill-appearing.  HENT:     Head: Normocephalic and atraumatic.     Right Ear: External ear normal.     Left Ear: External ear normal.     Nose: No congestion.  Eyes:     Extraocular Movements: Extraocular movements intact.  Cardiovascular:     Rate and Rhythm: Normal rate.     Pulses: Normal pulses.  Pulmonary:     Effort: Pulmonary effort is normal. No respiratory distress.  Abdominal:     General: There is no distension.     Palpations: Abdomen is soft.  Musculoskeletal:        General: No tenderness or signs of injury.     Cervical back: Neck supple.     Right lower leg: No edema.     Left lower leg: No edema.     Comments: Patient has good distal strength without clonus.  Skin:    Findings: No erythema or rash.  Neurological:     General: No focal deficit present.     Mental Status: He is alert and oriented to person, place, and time.     Sensory: No sensory deficit.     Motor: No weakness or abnormal muscle tone.     Coordination: Coordination normal.  Psychiatric:        Mood and Affect: Mood normal.        Behavior: Behavior normal.      Imaging: No results found.

## 2022-04-04 NOTE — Procedures (Signed)
Lumbosacral Transforaminal Epidural Steroid Injection - Sub-Pedicular Approach with Fluoroscopic Guidance  Patient: Alex [name]      Date of Birth: 25-Apr-1961 MRN: [id number] PCP: Dettinger, Fransisca Kaufmann, MD      Visit Date: 03/27/2022   Universal Protocol:    Date/Time: 03/27/2022  Consent Given By: the patient  Position: PRONE  Additional Comments: Vital signs were monitored before and after the procedure. Patient was prepped and draped in the usual sterile fashion. The correct patient, procedure, and site was verified.   Injection Procedure Details:   Procedure diagnoses: Lumbar radiculopathy [M54.16]    Meds Administered:  Meds ordered this encounter  Medications   methylPREDNISolone acetate (DEPO-MEDROL) injection 80 mg    Laterality: Left  Location/Site: L5  Needle:5.0 in., 22 ga.  Short bevel or Quincke spinal needle  Needle Placement: Transforaminal  Findings:    -Comments: Excellent flow of contrast along the nerve, nerve root and into the epidural space.  Procedure Details: After squaring off the end-plates to get a true AP view, the C-arm was positioned so that an oblique view of the foramen as noted above was visualized. The target area is just inferior to the "nose of the scotty dog" or sub pedicular. The soft tissues overlying this structure were infiltrated with 2-3 ml. of 1% Lidocaine without Epinephrine.  The spinal needle was inserted toward the target using a "trajectory" view along the fluoroscope beam.  Under AP and lateral visualization, the needle was advanced so it did not puncture dura and was located close the 6 O'Clock position of the pedical in AP tracterory. Biplanar projections were used to confirm position. Aspiration was confirmed to be negative for CSF and/or blood. A 1-2 ml. volume of Isovue-250 was injected and flow of contrast was noted at each level. Radiographs were obtained for documentation purposes.   After attaining the desired  flow of contrast documented above, a 0.5 to 1.0 ml test dose of 0.25% Marcaine was injected into each respective transforaminal space.  The patient was observed for 90 seconds post injection.  After no sensory deficits were reported, and normal lower extremity motor function was noted,   the above injectate was administered so that equal amounts of the injectate were placed at each foramen (level) into the transforaminal epidural space.   Additional Comments:  The patient tolerated the procedure well Dressing: 2 x 2 sterile gauze and Band-Aid    Post-procedure details: Patient was observed during the procedure. Post-procedure instructions were reviewed.  Patient left the clinic in stable condition.

## 2022-04-06 ENCOUNTER — Telehealth: Payer: Medicare HMO

## 2022-04-10 ENCOUNTER — Ambulatory Visit: Payer: Medicare HMO | Admitting: Orthopaedic Surgery

## 2022-04-11 ENCOUNTER — Telehealth: Payer: Self-pay

## 2022-04-12 NOTE — Progress Notes (Signed)
[facility] Quality Team Note  Name: Alex [name] Date of Birth: 12/06/1960 MRN: [id number] Date: 04/12/2022  [facility] Quality Team has reviewed this patient's chart, please see recommendations below:  Diabetic Retinal Eye Exam; Patient requests [facility] Quality Coordinator to schedule Diabetic Retinal Screening at ([place] Event 04/19/2022 10:00am).

## 2022-04-13 NOTE — Telephone Encounter (Signed)
Alex [name] called again:   Per patient a pain medication  increase was discussed at the last office visit. He stated his leg & back pain is not getting much better.  Call back phone [phone].

## 2022-04-16 ENCOUNTER — Telehealth: Payer: Self-pay

## 2022-04-16 NOTE — Telephone Encounter (Signed)
Mr. Alex [name] is still wanting a stronger medication. He also called for refill of the Hydrocodone 7.5-325 MG. Which he states is not covering his pain.    PMP REPORT:  Filled  Written  ID  Drug  QTY  Days  Prescriber  RX #  Dispenser  Refill  Daily Dose*  Pymt Type  PMP  03/19/2022 03/19/2022 3  Hydrocodone-Acetamin 7.5-325 120.00 30 Yu Sht [id number] Nor ([id number]) 0/0 30.00 MME Medicare [place] 02/28/2022 02/28/2022 1  Hydrocodone-Acetamin 10-325 Mg 30.00 7 Pe Whi [id number] Mad ([id number]) 0/0 42.86 MME Medicare [place] 02/28/2022 02/28/2022 3  Oxycodone Hcl (Ir) 5 Mg Tablet 20.00 5 Ch Bla [id number] Nor ([id number]) 0/0 30.00 MME Medicare [place] 02/15/2022 02/13/2022 2  Hydrocodone-Acetamin 7.5-325 120.00 [place] [id number] Nor ([id number]) 0/0 30.00 MME Medicare [place]

## 2022-04-17 ENCOUNTER — Ambulatory Visit: Payer: Medicare HMO | Admitting: Orthopaedic Surgery

## 2022-04-17 MED ORDER — HYDROCODONE-ACETAMINOPHEN 10-325 MG PO TABS: 1.0000 | ORAL_TABLET | Freq: Four times a day (QID) | ORAL | 0 refills | Status: DC | PRN

## 2022-04-18 ENCOUNTER — Telehealth: Payer: Self-pay | Admitting: Physical Medicine & Rehabilitation

## 2022-04-18 DIAGNOSIS — I152 Hypertension secondary to endocrine disorders: Secondary | ICD-10-CM

## 2022-04-18 NOTE — Telephone Encounter (Signed)
Patient called about his refill for Hydrocodone--it was sent to wrong pharmacy.  He stated he always gets them from [facility] in [place].  He said he would go to the pharmacy that it was sent to, but he wanted to find out what is wrong with our office sending to wrong pharmacy.

## 2022-04-19 LAB — HM DIABETES EYE EXAM

## 2022-04-27 ENCOUNTER — Ambulatory Visit: Payer: Medicare HMO | Admitting: Physical Medicine & Rehabilitation

## 2022-05-04 ENCOUNTER — Ambulatory Visit: Payer: Medicare HMO | Admitting: Orthopaedic Surgery

## 2022-05-07 ENCOUNTER — Encounter: Payer: Medicare HMO | Admitting: Physical Medicine & Rehabilitation

## 2022-05-10 ENCOUNTER — Telehealth: Payer: Self-pay

## 2022-05-10 ENCOUNTER — Encounter: Payer: Self-pay | Admitting: Physical Medicine & Rehabilitation

## 2022-05-10 ENCOUNTER — Encounter: Payer: Medicare HMO | Attending: Physical Medicine & Rehabilitation | Admitting: Physical Medicine & Rehabilitation

## 2022-05-10 VITALS — BP 167/96 | HR 71 | Ht 66.0 in | Wt 218.2 lb

## 2022-05-10 DIAGNOSIS — G894 Chronic pain syndrome: Secondary | ICD-10-CM

## 2022-05-10 DIAGNOSIS — F39 Unspecified mood [affective] disorder: Secondary | ICD-10-CM | POA: Diagnosis not present

## 2022-05-10 DIAGNOSIS — M25552 Pain in left hip: Secondary | ICD-10-CM

## 2022-05-10 DIAGNOSIS — F4320 Adjustment disorder, unspecified: Secondary | ICD-10-CM

## 2022-05-10 DIAGNOSIS — F1210 Cannabis abuse, uncomplicated: Secondary | ICD-10-CM | POA: Diagnosis not present

## 2022-05-10 DIAGNOSIS — M47816 Spondylosis without myelopathy or radiculopathy, lumbar region: Secondary | ICD-10-CM | POA: Diagnosis not present

## 2022-05-10 MED ORDER — VENLAFAXINE HCL ER 37.5 MG PO CP24: 37.5000 mg | ORAL_CAPSULE | Freq: Every day | ORAL | 2 refills | Status: AC

## 2022-05-10 NOTE — Telephone Encounter (Signed)
Mailed Novo Nordisk renewal application to patient home.   Kamaljit Hizer L. CPhT Rx Patient Advocate  

## 2022-05-10 NOTE — Progress Notes (Signed)
Subjective:    Patient ID: Alex [name], male    DOB: 25-Feb-1961, 61 y.o.   MRN: [id number]  HPI HPI 61 year old male with past medical history of hypertension diabetes hyperlipidemia, right CTS, cervical spine degeneration status post cervical fusion C3-C5 2019 right total knee arthroplasty with revision in 2020 here for chronic pain.  He reports chronic back pain and knee pain for many years.  Pain is sharp and extension in his lower back. He has been back pain worse with extension as well as occasional shooting pain down his right leg. It was worsened a few weeks ago when he lifted a heavy item.  He uses Voltaren gel with improvement.  He was also prescribed hydrocodone which she reports using sparingly with benefit.  Flexeril tizanidine gabapentin and Lyrica did not help his pain.  Tramadol did not help the pain.  Tylenol and NSAIDs did not help.  He says he sometimes goes to the ER to help with pain treatment.  He has been working  with PT with some benefit.  He had a lumbar spinal epidural injection by Dr. Ernestina Patches August 22, 2021 with benefit but it did not last.  He is followed by Dr. Ninfa Linden orthopedics.  He was seen a couple years ago in 2021 by Dr. Posey Pronto [facility].   Visit  04/02/22     Alex [name] is here for follow-up of his chronic back pain.  Patient reports he had worsening pain after he fell off a tractor a couple weeks ago.  He reports he did not suffer any major injuries however the pain in his right knee and lower back were worsened temporarily.  He was having pain shooting down his left leg to his ankle.  He has been wearing a knee brace on his right lower extremity, reports the swelling and pain has improved.  He was seen by orthopedics Dr. Ninfa Linden and Dr.  Lorin Mercy for his knee and lower back.  She had a lumbar ESI by Dr. Ernestina Patches 03/27/2022.  His pain has improved since having his ESI completed.  He used ibuprofen for short period of time.  He is stopped using ibuprofen and is now using  Aleve occasionally.  He is also attempting to lose weight and his PCP started him on a weight loss medication.  He is also using a TENS unit to help with his pain.  Denies any side effects with the hydrocodone 7.5 mg and reports it is helping his pain and allowing him to be more functional.  He says he is able to do more at home and in his yard while using this medication.  Interval History Alex [name] is here for follow-up of his chronic back pain.  He reports he is feeling down because he found out that his wife was cheating on him, they are planning to get a divorce.  He says he talked to his chaplain about this and this was beneficial.  Patient reports he is very angry with her but he has no plans to harm himself, his wife or her partner.  He says he would not want to do anything like this because of his religion and says she is not worth it.  He says he would be willing to talk with the psychologist about his decreased mood.  He did try duloxetine in the past but it made him feel a little weird.  He would also be willing to try a medication for mood.  He he continues to have pain in  his lower back.  He feels pain radiating to his left hip.   Pain Inventory Average Pain 8 Pain Right Now 7 My pain is sharp, stabbing, and aching  In the last 24 hours, has pain interfered with the following? General activity 3 Relation with others 0 Enjoyment of life 5 What TIME of day is your pain at its worst? morning , evening, and night Sleep (in general) Fair  Pain is worse with: walking, bending, sitting, and some activites Pain improves with: rest, heat/ice, medication, and TENS Relief from Meds: 8  Family History  Problem Relation Age of Onset   Cancer Mother 54       small intestinal   Diabetes Father    Hypertension Father    Heart attack Father    Early death Sister    Brain cancer Brother    Colon cancer Neg Hx    Social History   Socioeconomic History   Marital status: Married     Spouse name: Not on file   Number of children: 2   Years of education: 12   Highest education level: 12th grade  Occupational History   Not on file  Tobacco Use   Smoking status: Never   Smokeless tobacco: Current    Types: Chew  Vaping Use   Vaping Use: Never used  Substance and Sexual Activity   Alcohol use: Not Currently    Comment: Quit EtOH 20 years ago   Drug use: No   Sexual activity: Not on file  Other Topics Concern   Not on file  Social History Narrative   Not on file   Social Determinants of Health   Financial Resource Strain: Not on file  Food Insecurity: Not on file  Transportation Needs: Not on file  Physical Activity: Not on file  Stress: Not on file  Social Connections: Not on file   Past Surgical History:  Procedure Laterality Date   ANTERIOR CERVICAL DECOMP/DISCECTOMY FUSION N/A 03/27/2018   Procedure: ANTERIOR CERVICAL DECOMPRESSION/DISCECTOMY FUSION C3-5;  Surgeon: Venita Lick, MD;  Location: [facility];  Service: Orthopedics;  Laterality: N/A;  3.5 hrs   arm surgery Right    x2 involving tendons/ulnar nerve   BACK SURGERY     BIOPSY  10/17/2020   Procedure: BIOPSY;  Surgeon: Lanelle Bal, DO;  Location: [facility];  Service: Endoscopy;;   CARPAL TUNNEL RELEASE Right    clavical surgery     ESOPHAGOGASTRODUODENOSCOPY (EGD) WITH PROPOFOL N/A 10/17/2020   Procedure: ESOPHAGOGASTRODUODENOSCOPY (EGD) WITH PROPOFOL;  Surgeon: Lanelle Bal, DO;  [facility]: [facility];  Service: Endoscopy;  Laterality: N/A;  PM   EYE SURGERY Bilateral    glaucoma   HERNIA REPAIR     JOINT REPLACEMENT Right 06/2011   TOTAL KNEE REVISION Right 04/10/2019   Procedure: RIGHT TOTAL KNEE REVISION;  Surgeon: Kathryne Hitch, MD;  Location: [facility];  Service: Orthopedics;  Laterality: Right;   TRICEPS TENDON REPAIR     Past Surgical History:  Procedure Laterality Date   ANTERIOR CERVICAL DECOMP/DISCECTOMY FUSION N/A 03/27/2018   Procedure: ANTERIOR CERVICAL  DECOMPRESSION/DISCECTOMY FUSION C3-5;  Surgeon: Venita Lick, MD;  Location: [facility];  Service: Orthopedics;  Laterality: N/A;  3.5 hrs   arm surgery Right    x2 involving tendons/ulnar nerve   BACK SURGERY     BIOPSY  10/17/2020   Procedure: BIOPSY;  Surgeon: Lanelle Bal, DO;  [facility];  Service: Endoscopy;;   CARPAL TUNNEL RELEASE  Right    clavical surgery     ESOPHAGOGASTRODUODENOSCOPY (EGD) WITH PROPOFOL N/A 10/17/2020   Procedure: ESOPHAGOGASTRODUODENOSCOPY (EGD) WITH PROPOFOL;  Surgeon: Lanelle Bal, DO;  [facility];  Service: Endoscopy;  Laterality: N/A;  PM   EYE SURGERY Bilateral    glaucoma   HERNIA REPAIR     JOINT REPLACEMENT Right 06/2011   TOTAL KNEE REVISION Right 04/10/2019   Procedure: RIGHT TOTAL KNEE REVISION;  Surgeon: Kathryne Hitch, MD;  Location: [facility];  Service: Orthopedics;  Laterality: Right;   TRICEPS TENDON REPAIR     Past Medical History:  Diagnosis Date   Diabetes mellitus without complication (HCC)    History of kidney stones    removed 5 months ago   Hypertension    PONV (postoperative nausea and vomiting)    AGITATION after anesthesia as well   Sleep apnea    NO CPAP   BP (!) 167/96   Pulse 71   Ht 5\' 6"  (1.676 m)   Wt 218 lb 3.2 oz (99 kg)   SpO2 97%   BMI 35.22 kg/m   Opioid Risk Score:   Fall Risk Score:  `1  Depression screen [facility] 2/9     05/10/2022   10:04 AM 03/19/2022    8:19 AM 02/13/2022   10:28 AM 12/18/2021   10:12 AM 12/08/2021    8:09 AM 11/17/2021    2:52 PM 09/01/2021    8:43 AM  Depression screen PHQ 2/9  Decreased Interest 3 2 0 1 1 3 1   Down, Depressed, Hopeless 3 1 0 1 0 1 1  PHQ - 2 Score 6 3 0 2 1 4 2   Altered sleeping  2   1 3 1   Tired, decreased energy  1   2 1 1   Change in appetite  1   1 1 1   Feeling bad or failure about yourself   0   3 3 1   Trouble concentrating  0   1 1 0  Moving slowly or fidgety/restless  0   0 0 0  Suicidal thoughts  0   1 1 0  PHQ-9 Score   7   10 14 6   Difficult doing work/chores Very difficult Not difficult at all   Very difficult Very difficult      Review of Systems  Constitutional: Negative.   HENT: Negative.    Eyes: Negative.   Respiratory: Negative.    Cardiovascular: Negative.   Gastrointestinal: Negative.   Endocrine: Negative.   Genitourinary: Negative.   Musculoskeletal:  Positive for back pain.  Skin: Negative.   Allergic/Immunologic: Negative.   Psychiatric/Behavioral:  Positive for agitation.        Dealing with a lot of anger toward his spouse whom he reports he caught cheating on him.  All other systems reviewed and are negative.      Objective:   Physical Exam   Gen: no distress, normal appearing HEENT: oral mucosa pink and moist, NCAT Cardio: Reg rate Chest: normal effort, normal rate of breathing Abd: soft, non-distended Ext: no edema Psych: pleasant, normal affect Skin: intact Neuro: Alert, cranial nerves II through XII grossly intact, sensation intact in all 4 extremities coordination is normal no sensory deficits answers questions follows commands    Musculoskeletal:  + Lumbar paraspinal tenderness.   Facet loading positive Slump test negative Wearing a knee brace on his right knee Strength 5 out of 5 in bilateral upper extremities.  Strength also 5 out  of 5 in the lower extremities with some pain limitation at the hip He was noted to have some tenderness at his greater trochanter on the left, also some pain with external greater than internal rotation of his left hip    L spine xray 03/07/22 Impression: Lumbar facet arthropathy previous left SI joint fusion.    Pelvis xray 03/07/22 Impression: Left SI joint fusion.  Normal right SI joint.   Knee xray Right 8/30 1. Status post total knee arthroplasty. No evidence of acute fracture or hardware loosening. 2. Moderate joint effusion.   Knee L MRI 11/15/21   IMPRESSION: 1. Complete tear of the anterior cruciate ligament  without associated hemorrhage in the intercondylar notch or acute osseous findings, likely due to a remote tear (ACL deficient knee). 2. No evidence of meniscal tear. Probable septated ganglion adjacent to the anterior horn of the medial meniscus. 3. No acute ligamentous or osteochondral findings. 4. Minimal degenerative changes.   L Spine MRI 07/27/2021 Segmentation:  Standard.   Alignment:  Trace anterolisthesis of L5 over S1.   Vertebrae: No acute fracture, evidence of discitis, or bone lesion. Chronic superior endplate fracture with associated Schmorl node at T12 with approximately 50% height loss anteriorly. No retropulsion. Hemangiomas at L1, L3 and S2. Postsurgical changes from left sacroiliac joint fixation.   Conus medullaris and cauda equina: Conus extends to the L1 level. Conus and cauda equina appear normal.   Paraspinal and other soft tissues: Negative.   Disc levels:   T12-L1: No spinal canal or neural foraminal stenosis.   L1-2: Loss of disc height, shallow disc bulge with superimposed central disc protrusion mild facet degenerative changes resulting in mild spinal canal stenosis. No significant neural foraminal narrowing.   L2-3: No spinal canal or neural foraminal stenosis.   L3-4: Shallow disc bulge and mild facet degenerative changes without significant spinal canal or neural foraminal stenosis.   L4-5: Hypertrophic facet degenerative changes with bilateral joint effusion with articular process and periarticular soft tissue edema, left greater than right, suggesting active osteoarthritis. No significant spinal canal or neural foraminal stenosis.   L5-S1: Disc bulge/disc uncovering. Prominent hypertrophic facet degenerative changes with right-sided joint effusion, articular process and periarticular soft tissue edema, suggesting active osteoarthritis. Moderate right and severe left neural foraminal narrowing.   1. Degenerative changes of the lumbar  spine, more pronounced at the level of the facet joints at L4-5 and L5-S1 where there is evidence of active osteoarthritis. 2. Moderate right and severe left neural foraminal narrowing at L5-S1.       Assessment & Plan:   IMPRESSION: Lumbar spondylosis with multilevel degenerative changes of his facet joints and foraminal narrowing severe left moderate right at L5-S1, he has radicular symptoms on the left -Facet joint injections may be an option at later time -Poor results with duloxetine, elavil in past -We will order low-dose venlafaxine, can increase increase this medication. -S/p lumbar ESI previously by Dr Alvester Morin with a few weeks of improvement, can consider repeating if needed at later time -Will continue Norco 10 mg QID as needed   Marijuana abuse  -He denies current use, continue to follow with [facility]   Bilateral knee pain with hx right total knee replacement, left hip pain -He follows with Dr. Magnus Ivan orthopedics -R knee pain after fall from tractor is improving  -Will check Xray of L hip  Mood Disorder -Start Venlafaxine  -Referral to mental health

## 2022-05-16 ENCOUNTER — Ambulatory Visit (INDEPENDENT_AMBULATORY_CARE_PROVIDER_SITE_OTHER): Payer: Medicare HMO | Admitting: Orthopaedic Surgery

## 2022-05-16 ENCOUNTER — Encounter: Payer: Self-pay | Admitting: Orthopaedic Surgery

## 2022-05-16 VITALS — BP 148/100 | HR 68 | Ht 66.0 in | Wt 218.0 lb

## 2022-05-16 DIAGNOSIS — M7062 Trochanteric bursitis, left hip: Secondary | ICD-10-CM | POA: Diagnosis not present

## 2022-05-16 MED ORDER — BUPIVACAINE HCL 0.25 % IJ SOLN
2.0000 mL | INTRAMUSCULAR | Status: AC | PRN
  Administered 2022-05-16: 2 mL via INTRA_ARTICULAR

## 2022-05-16 MED ORDER — METHYLPREDNISOLONE ACETATE 40 MG/ML IJ SUSP
40.0000 mg | INTRAMUSCULAR | Status: AC | PRN
  Administered 2022-05-16: 40 mg via INTRA_ARTICULAR

## 2022-05-16 MED ORDER — LIDOCAINE HCL 1 % IJ SOLN
1.0000 mL | INTRAMUSCULAR | Status: AC | PRN
  Administered 2022-05-16: 1 mL

## 2022-05-16 NOTE — Progress Notes (Signed)
Office Visit Note   Patient: Alex [name]           Date of Birth: [birth year]           MRN: [id number] Visit Date: 05/16/2022              Requested by: Nils Pyle, MD [address] [place],  [place] [address] PCP: Dettinger, Elige Radon, MD   Assessment & Plan: Visit Diagnoses:  1. Trochanteric bursitis, left hip     Plan: Left trochanteric bursal injection performed which she tolerated well.  Follow-up as needed.  Follow-Up Instructions: Return if symptoms worsen or fail to improve.   Orders:  Orders Placed This Encounter  Procedures   Large Joint Inj: L greater trochanter   No orders of the defined types were placed in this encounter.     Procedures: Large Joint Inj: L greater trochanter on 05/16/2022 9:52 AM Details: lateral approach Medications: 1 mL lidocaine 1 %; 2 mL bupivacaine 0.25 %; 40 mg methylPREDNISolone acetate 40 MG/ML      Clinical Data: No additional findings.   Subjective: Chief Complaint  Patient presents with   Lower Back - Pain, Follow-up    HPI 61 year old male returns had an epidural 03/27/2022 states it helped.  He has been going to his pain management doctor and is on hydrocodone 10/325 120 tablets a month and states his pain doctor was talking about a hip injection and is wondering if we could do this was here today.  He has had pain laterally over the trochanter he ambulates with a limp previous SI joint fusion on the left with 3 coated bars.  Patient has been under some stress is going through a divorce.  Positive for diabetes, chronic pain.  Review of Systems all other systems noncontributory to HPI.   Objective: Vital Signs: BP (!) 148/100   Pulse 68   Ht 5\' 6"  (1.676 m)   Wt 218 lb (98.9 kg)   BMI 35.19 kg/m   Physical Exam Constitutional:      Appearance: He is well-developed.  HENT:     Head: Normocephalic and atraumatic.     Right Ear: External ear normal.     Left Ear: External ear normal.  Eyes:     Pupils:  Pupils are equal, round, and reactive to light.  Neck:     Thyroid: No thyromegaly.     Trachea: No tracheal deviation.  Cardiovascular:     Rate and Rhythm: Normal rate.  Pulmonary:     Effort: Pulmonary effort is normal.     Breath sounds: No wheezing.  Abdominal:     General: Bowel sounds are normal.     Palpations: Abdomen is soft.  Musculoskeletal:     Cervical back: Neck supple.  Skin:    General: Skin is warm and dry.     Capillary Refill: Capillary refill takes less than 2 seconds.  Neurological:     Mental Status: He is alert and oriented to person, place, and time.  Psychiatric:        Behavior: Behavior normal.        Thought Content: Thought content normal.        Judgment: Judgment normal.     Ortho Exam negative logroll hips he has some sciatic notch tenderness on the left.  Significant increased tenderness over the left greater trochanter which reproduces his pain.  Pain radiates from his hip down to his knee.  Reflexes are intact.  Specialty  Comments:  No specialty comments available.  Imaging: Previous pelvis and hip x-rays 03/07/2022 showed left SI joint fusion.  Left hip joint space was maintained without osteophytosis.   PMFS History: Patient Active Problem List   Diagnosis Date Noted   Trochanteric bursitis, left hip 05/16/2022   Hyperlipidemia associated with type 2 diabetes mellitus (HCC) 06/23/2020   Status post revision of total replacement of right knee 04/10/2019   Loose right total knee arthroplasty (HCC) 03/09/2019   Neuropathic pain 01/01/2019   Cervical spine degeneration 12/30/2018   S/P cervical spinal fusion 03/27/2018   Morbid obesity (HCC) 02/21/2018   Recurrent nephrolithiasis 09/18/2017   Diabetes mellitus type 2 in obese (HCC) 03/28/2017   Hypertension associated with diabetes (HCC) 08/25/2016   Past Medical History:  Diagnosis Date   Diabetes mellitus without complication (HCC)    History of kidney stones    removed 5 months  ago   Hypertension    PONV (postoperative nausea and vomiting)    AGITATION after anesthesia as well   Sleep apnea    NO CPAP    Family History  Problem Relation Age of Onset   Cancer Mother 7       small intestinal   Diabetes Father    Hypertension Father    Heart attack Father    Early death Sister    Brain cancer Brother    Colon cancer Neg Hx     Past Surgical History:  Procedure Laterality Date   ANTERIOR CERVICAL DECOMP/DISCECTOMY FUSION N/A 03/27/2018   Procedure: ANTERIOR CERVICAL DECOMPRESSION/DISCECTOMY FUSION C3-5;  Surgeon: Venita Lick, MD;  Location: [facility];  Service: Orthopedics;  Laterality: N/A;  3.5 hrs   arm surgery Right    x2 involving tendons/ulnar nerve   BACK SURGERY     BIOPSY  10/17/2020   Procedure: BIOPSY;  Surgeon: Lanelle Bal, DO;  [facility]: [facility];  Service: Endoscopy;;   CARPAL TUNNEL RELEASE Right    clavical surgery     ESOPHAGOGASTRODUODENOSCOPY (EGD) WITH PROPOFOL N/A 10/17/2020   Procedure: ESOPHAGOGASTRODUODENOSCOPY (EGD) WITH PROPOFOL;  Surgeon: Lanelle Bal, DO;  [facility];  Service: Endoscopy;  Laterality: N/A;  PM   EYE SURGERY Bilateral    glaucoma   HERNIA REPAIR     JOINT REPLACEMENT Right 06/2011   TOTAL KNEE REVISION Right 04/10/2019   Procedure: RIGHT TOTAL KNEE REVISION;  Surgeon: Kathryne Hitch, MD;  Location: [facility];  Service: Orthopedics;  Laterality: Right;   TRICEPS TENDON REPAIR     Social History   Occupational History   Not on file  Tobacco Use   Smoking status: Never   Smokeless tobacco: Current    Types: Chew  Vaping Use   Vaping Use: Never used  Substance and Sexual Activity   Alcohol use: Not Currently    Comment: Quit EtOH 20 years ago   Drug use: No   Sexual activity: Not on file

## 2022-05-18 ENCOUNTER — Telehealth: Payer: Self-pay

## 2022-05-18 MED ORDER — HYDROCODONE-ACETAMINOPHEN 10-325 MG PO TABS: 1.0000 | ORAL_TABLET | Freq: Four times a day (QID) | ORAL | 0 refills | Status: DC | PRN

## 2022-05-18 NOTE — Telephone Encounter (Signed)
Patient called requesting refill Hydrocodone/APAP 10/325 mg. He states that Dr. Kathreen Cornfield was going to increase to 5 tablets a day. Last filled 04/17/22

## 2022-05-19 ENCOUNTER — Other Ambulatory Visit: Payer: Self-pay | Admitting: Physical Medicine & Rehabilitation

## 2022-05-21 NOTE — Telephone Encounter (Signed)
refilled 

## 2022-05-23 ENCOUNTER — Ambulatory Visit (INDEPENDENT_AMBULATORY_CARE_PROVIDER_SITE_OTHER): Payer: Medicare HMO

## 2022-05-23 DIAGNOSIS — Z23 Encounter for immunization: Secondary | ICD-10-CM | POA: Diagnosis not present

## 2022-05-24 ENCOUNTER — Encounter: Payer: Self-pay | Admitting: Physical Medicine & Rehabilitation

## 2022-05-25 ENCOUNTER — Telehealth: Payer: Self-pay | Admitting: Pharmacist

## 2022-05-25 NOTE — Telephone Encounter (Signed)
Ozempic 2mg  weekly  Here for pick up Patient notified #2 boxes Reminder to turn in PAP papers for 2024

## 2022-06-07 ENCOUNTER — Other Ambulatory Visit: Payer: Self-pay | Admitting: Family Medicine

## 2022-06-11 ENCOUNTER — Ambulatory Visit: Payer: Medicare HMO | Admitting: Psychologist

## 2022-06-11 IMAGING — DX DG KNEE COMPLETE 4+V*R*
4 series · 4 of 4 positions shown · non-contrast
Comparison: Right knee series 01/03/2021.

CLINICAL DATA: 60-year-old male with pain radiating down the right
leg. Previous right knee arthroplasty and revision.

EXAM:
RIGHT KNEE - COMPLETE 4+ VIEW

[knee ap]
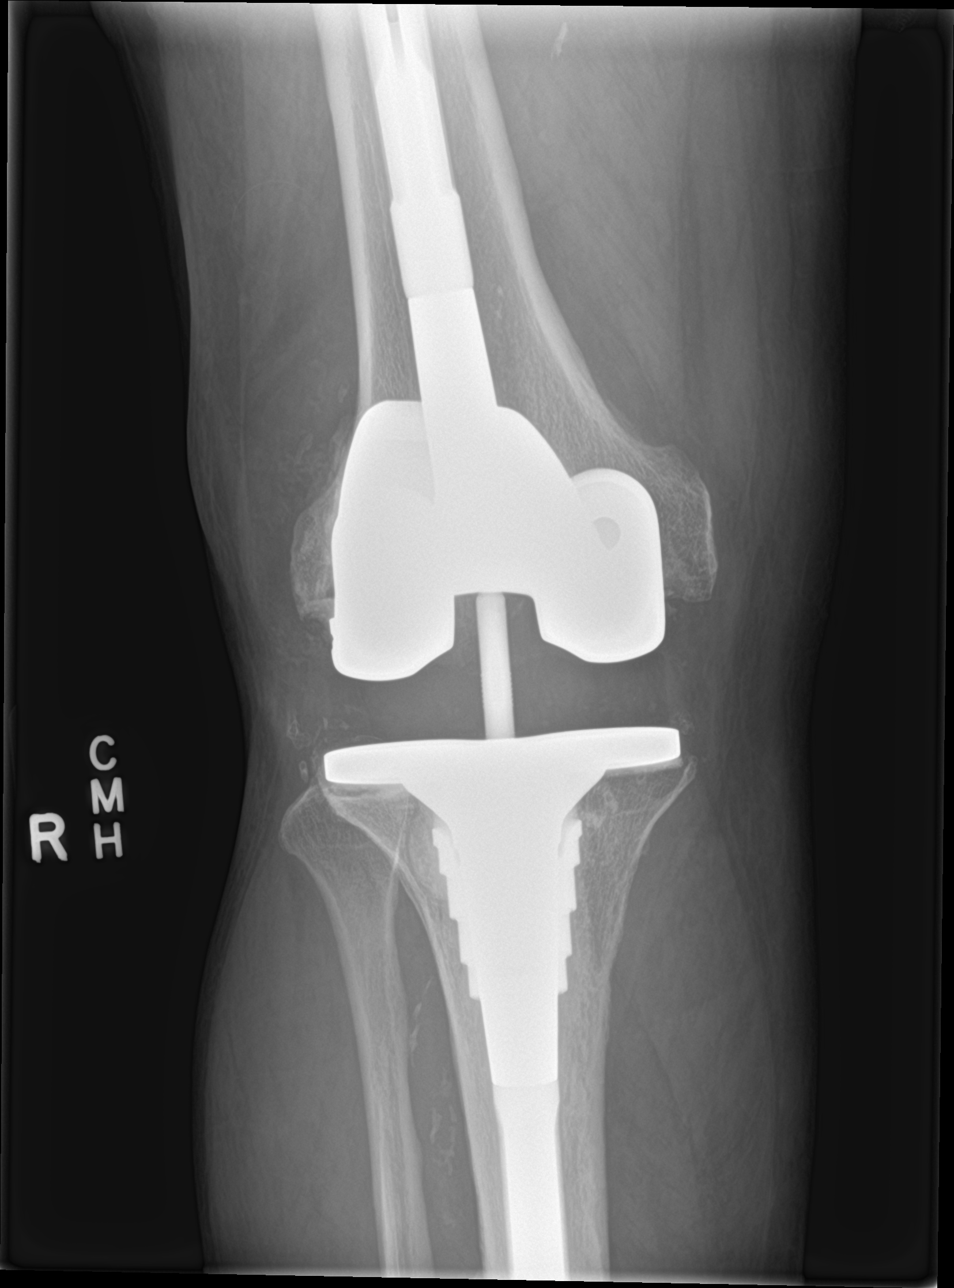

[knee obl (1 of 2)]
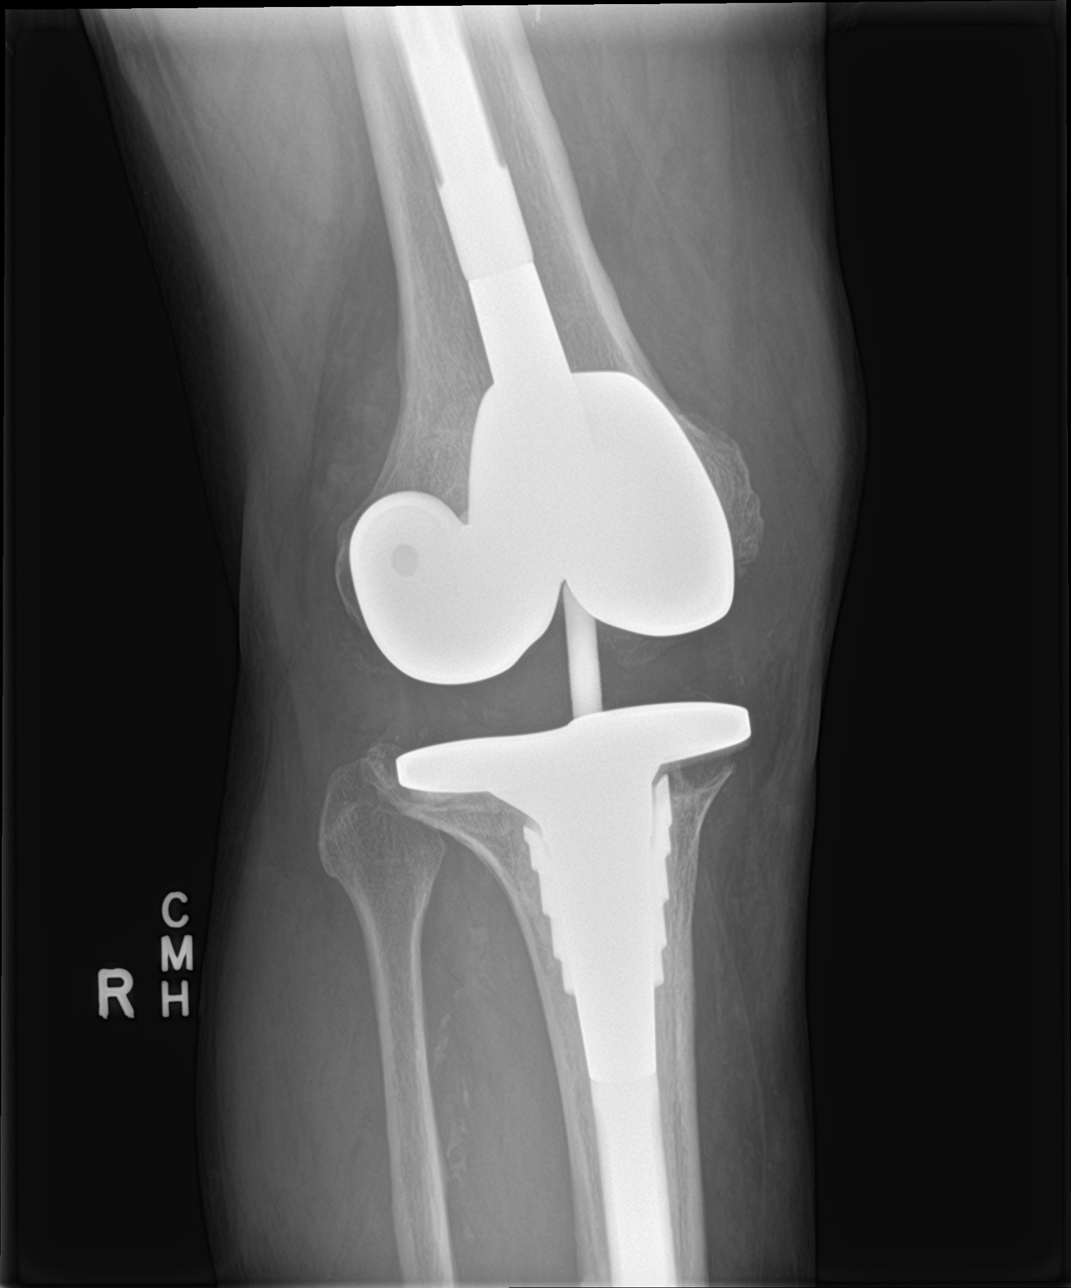

[knee obl (2 of 2)]
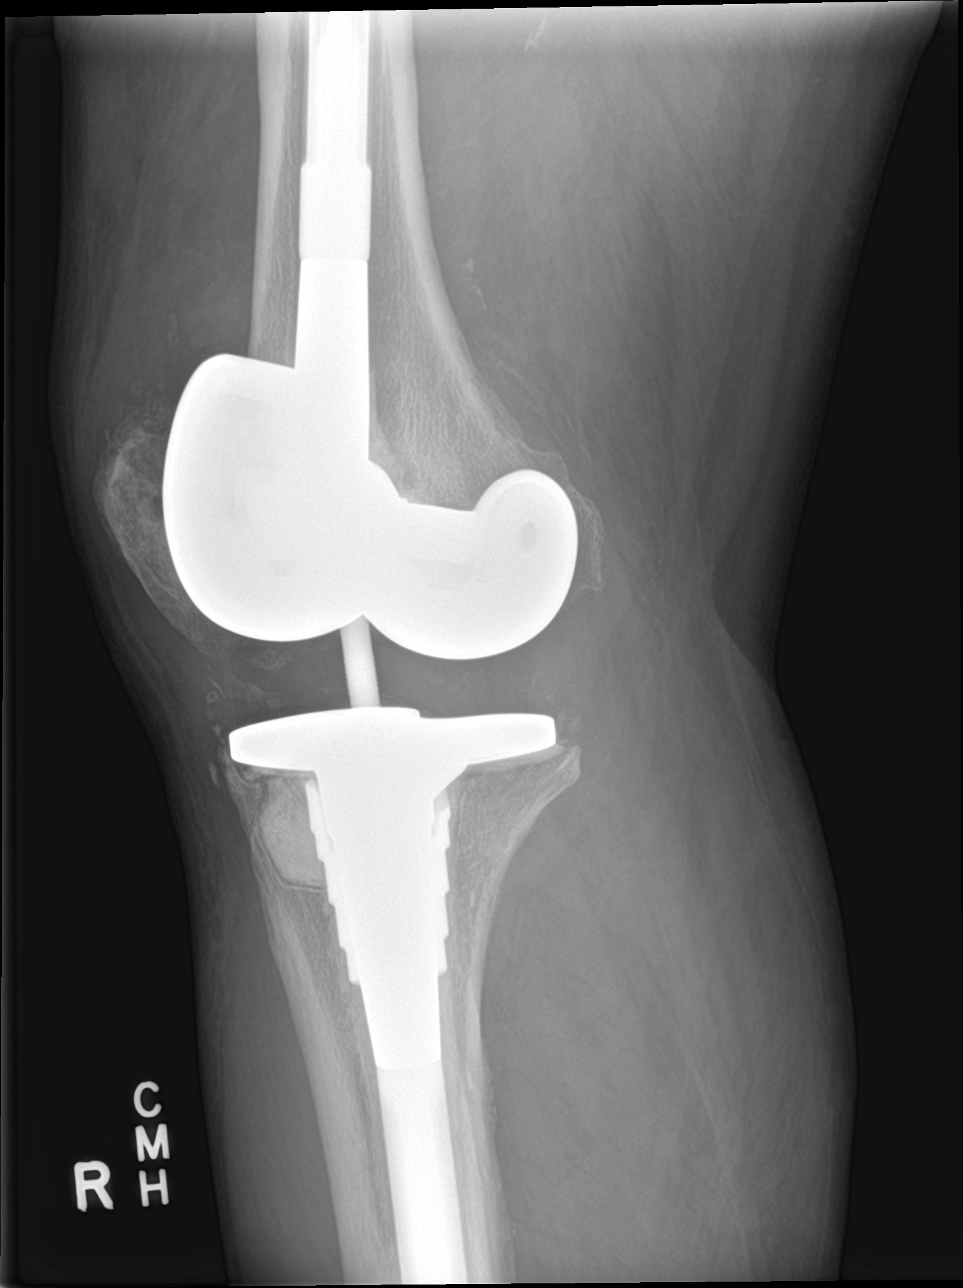

[knee lat]
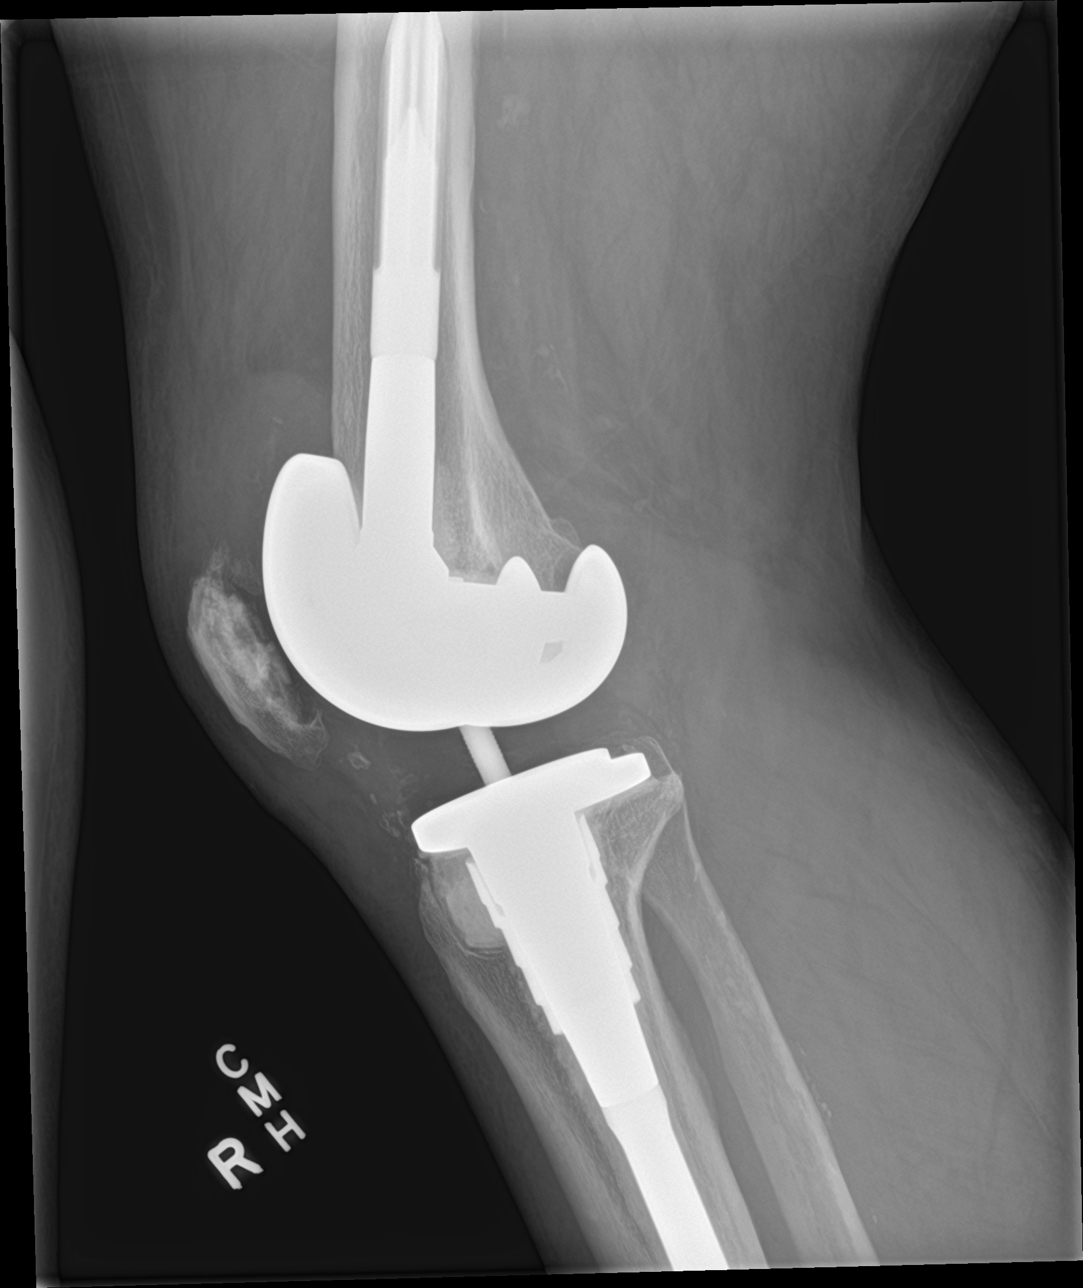

[4 of 4 positions shown; findings below may reference images not displayed]

FINDINGS: Right total knee arthroplasty. Both the femoral and tibial
intramedullary components are not completely included, but the
visible hardware appears intact and normally located. Chronic
postoperative changes to the patella. Evidence of chronic joint
effusion, stable to minimally increased since [REDACTED]. Calcified
peripheral vascular disease. No acute osseous abnormality
identified.
IMPRESSION: 1. Right total knee arthroplasty, visible hardware intact.
2. Chronic joint effusion suspected, stable to minimally increased
since [DATE].  No acute osseous abnormality identified.
4. Calcified peripheral vascular disease.

## 2022-06-11 IMAGING — DX DG LUMBAR SPINE COMPLETE 4+V
5 series · 5 of 5 positions shown · non-contrast
Comparison: None.

CLINICAL DATA: Back pain.

EXAM:
LUMBAR SPINE - COMPLETE 4+ VIEW

[l-spine ap]
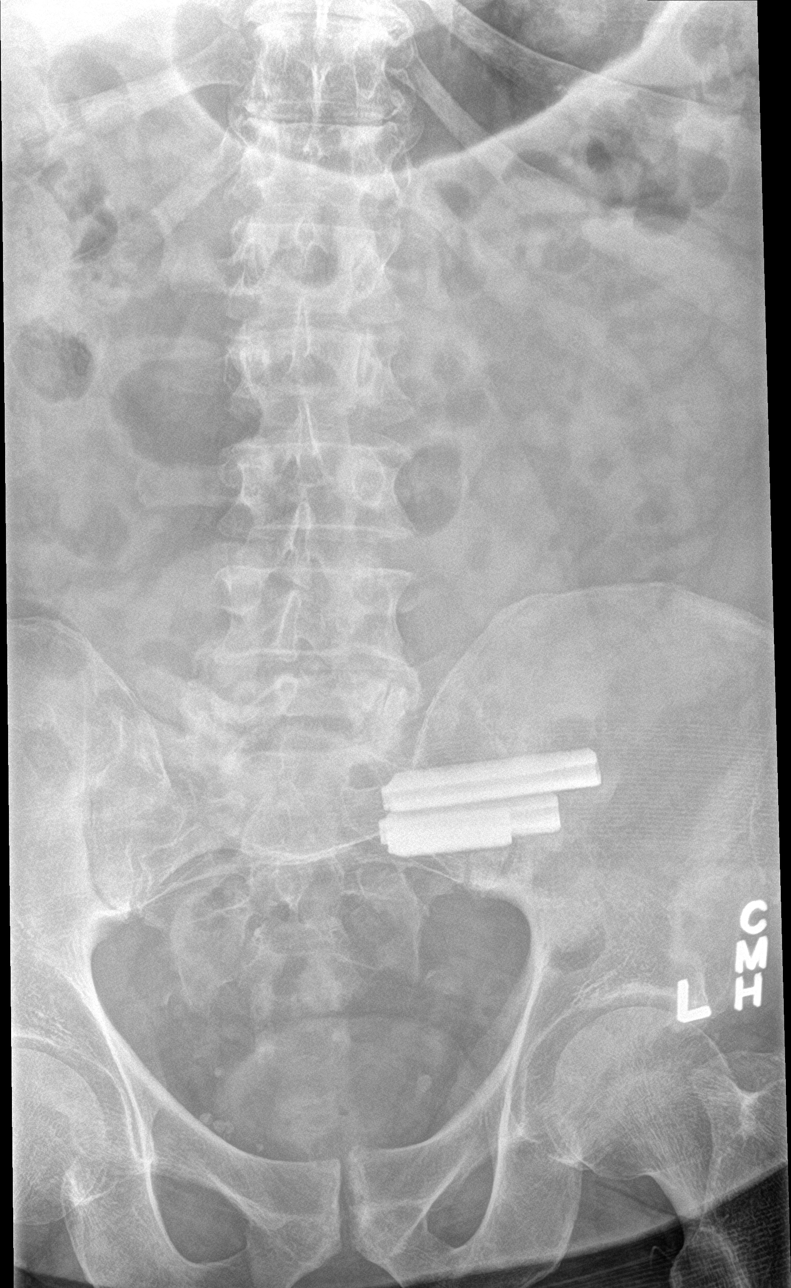

[l-spine obl (1 of 2)]
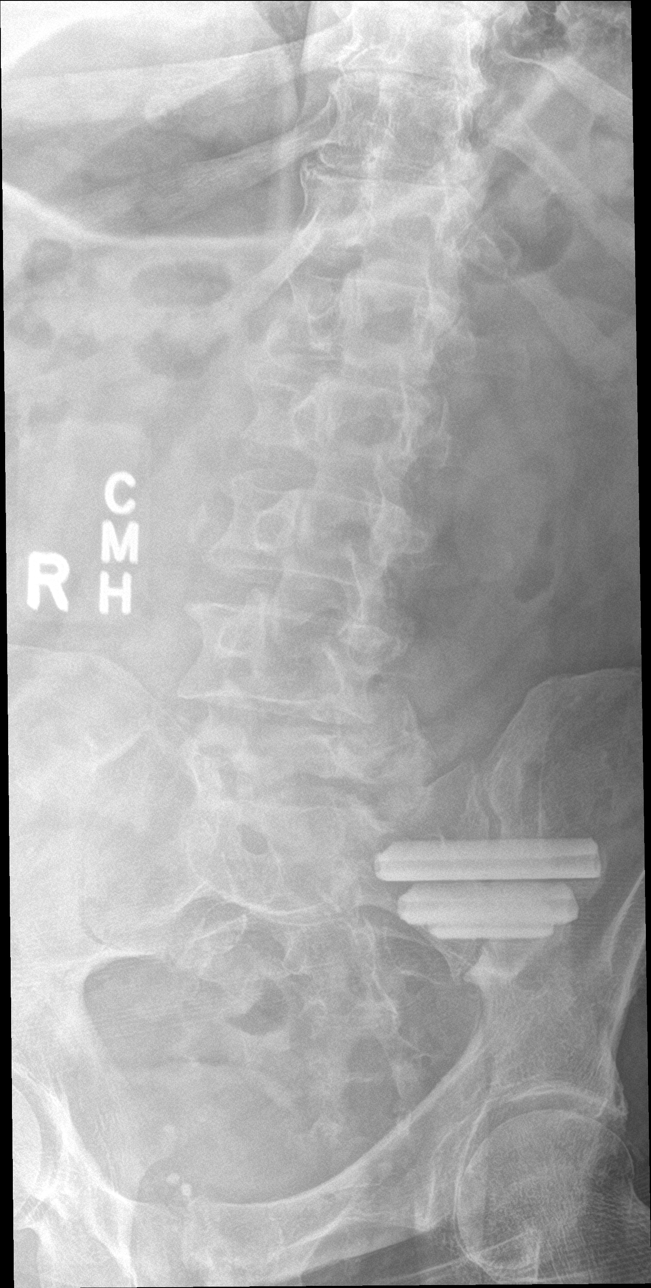

[l-spine obl (2 of 2)]
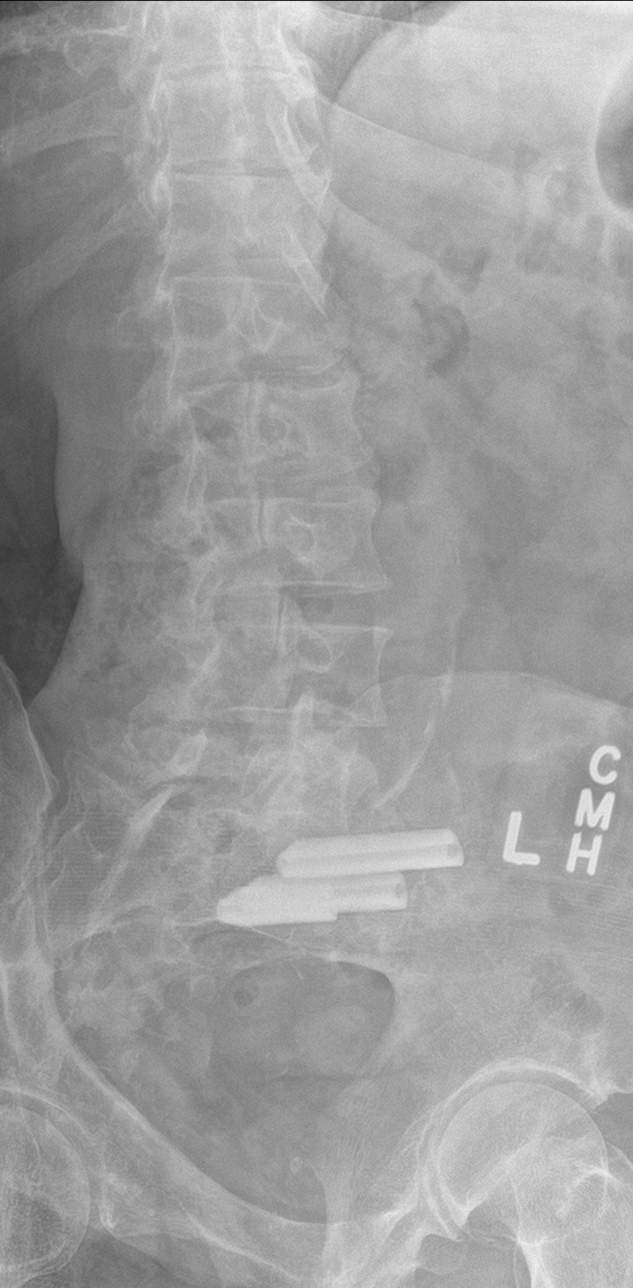

[l-spine lat]
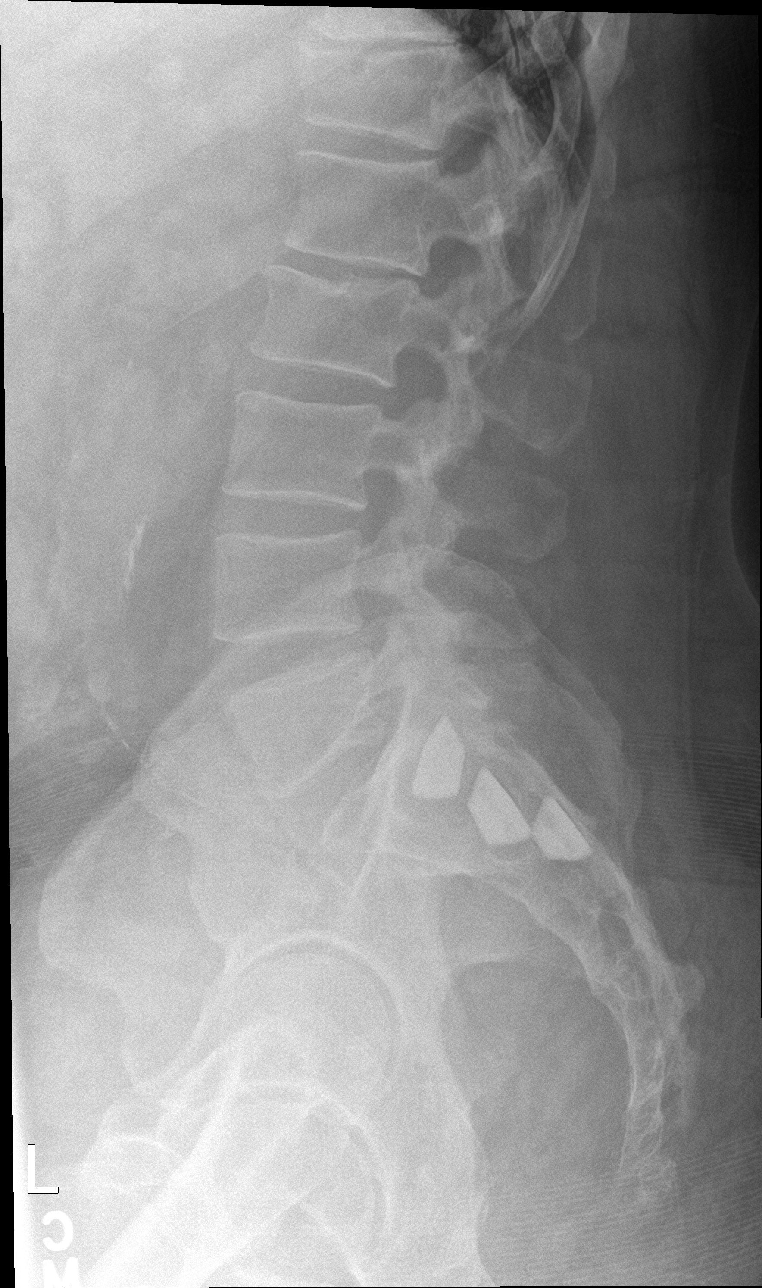

[l-spine spot]
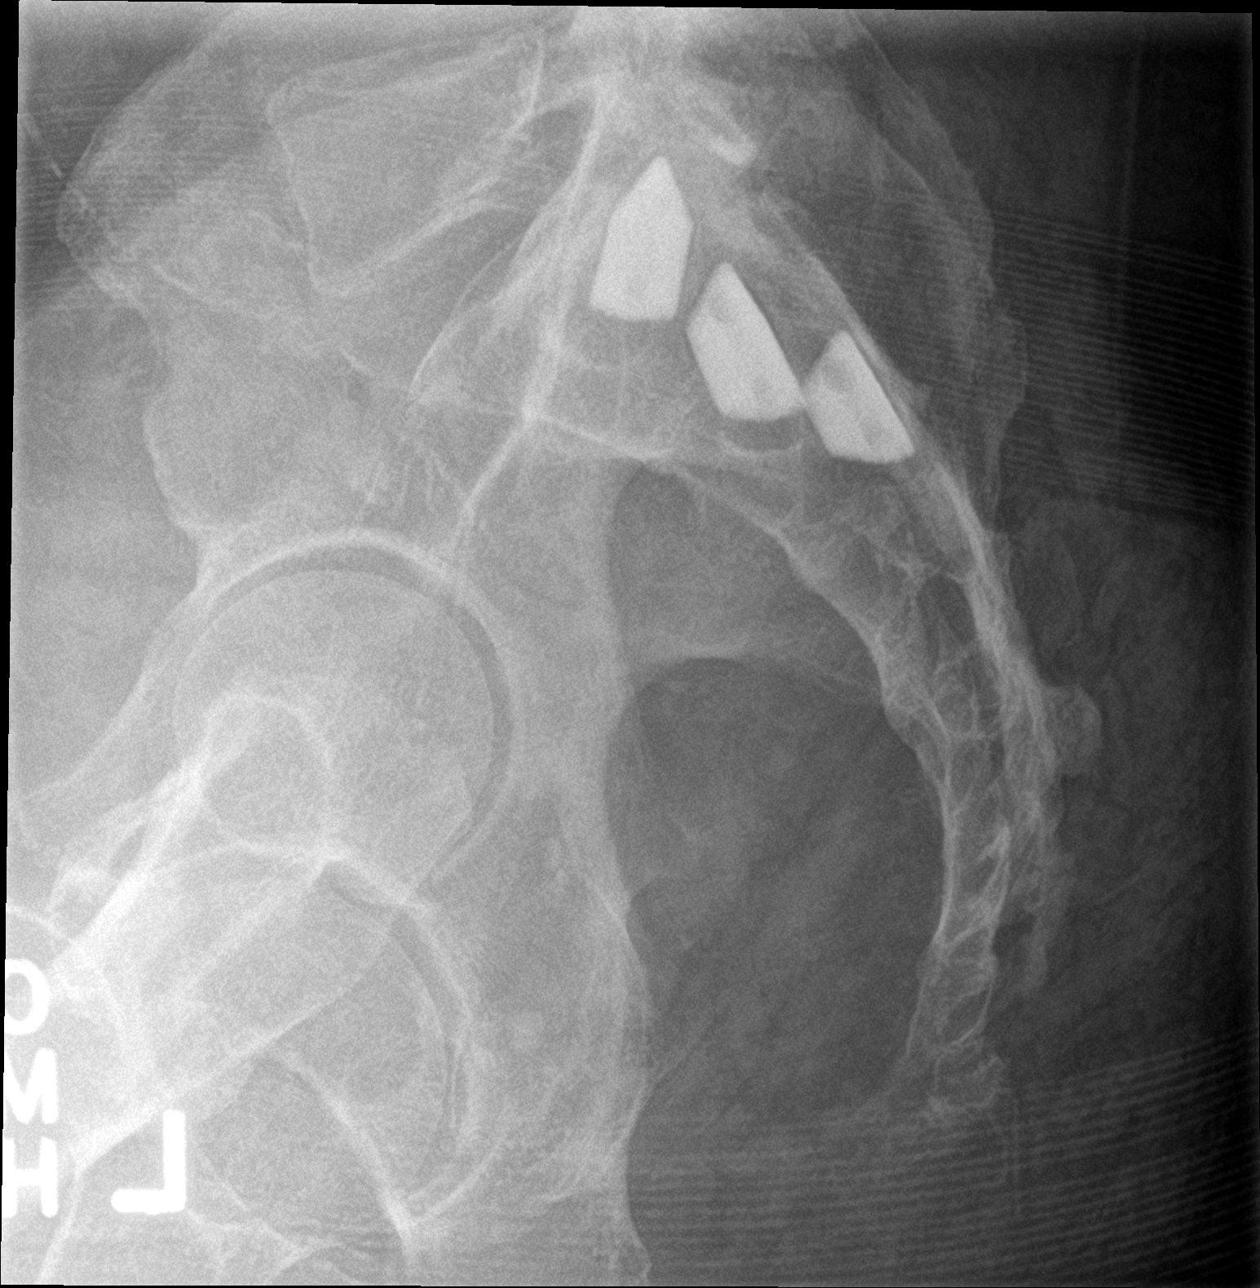

[5 of 5 positions shown; findings below may reference images not displayed]

FINDINGS: Status post left SI joint fusion. Mild superior endplate compression
deformity at L2 is stable since abdomen CT 09/11/2020. T12
compression deformity also stable since 09/11/2020. Facets are well
aligned bilaterally.
IMPRESSION: No acute bony abnormality.

Stable compression deformity at T12 and L2 since CT scan 09/11/2020.

## 2022-06-12 ENCOUNTER — Ambulatory Visit (INDEPENDENT_AMBULATORY_CARE_PROVIDER_SITE_OTHER): Payer: Medicare HMO | Admitting: Family Medicine

## 2022-06-12 DIAGNOSIS — U071 COVID-19: Secondary | ICD-10-CM | POA: Diagnosis not present

## 2022-06-12 MED ORDER — BENZONATATE 100 MG PO CAPS: 100.0000 mg | ORAL_CAPSULE | Freq: Three times a day (TID) | ORAL | 0 refills | Status: DC | PRN

## 2022-06-12 MED ORDER — MOLNUPIRAVIR EUA 200MG CAPSULE: 4.0000 | ORAL_CAPSULE | Freq: Two times a day (BID) | ORAL | 0 refills | Status: AC

## 2022-06-12 NOTE — Patient Instructions (Signed)

## 2022-06-12 NOTE — Progress Notes (Signed)
Telephone visit  Subjective: CC: sick PCP: Dettinger, Elige Radon, MD [id number] Slaymaker is a 61 y.o. male calls for telephone consult today. Patient provides verbal consent for consult held via phone.  Due to COVID-19 pandemic this visit was conducted virtually. This visit type was conducted due to national recommendations for restrictions regarding the COVID-19 Pandemic (e.g. social distancing, sheltering in place) in an effort to limit this patient's exposure and mitigate transmission in our community. All issues noted in this document were discussed and addressed.  A physical exam was not performed with this format.   Location of patient: home Location of provider: [facility] Others present for call: none  1. Sick Patient reports that he started having sweating yesterday am. He started coughing last night.  Having phlegm production and myalgia.  He tested positive for COVID at home. No diarrhea, nausea or vomiting.   ROS: Per HPI  Allergies  Allergen Reactions   Morphine And Related Nausea Only   Buprenorphine Hcl-Naloxone Hcl Nausea Only   Codeine Nausea Only and Other (See Comments)    per pt, "felt weird in my head"   Past Medical History:  Diagnosis Date   Diabetes mellitus without complication (HCC)    History of kidney stones    removed 5 months ago   Hypertension    PONV (postoperative nausea and vomiting)    AGITATION after anesthesia as well   Sleep apnea    NO CPAP    Current Outpatient Medications:    amLODipine (NORVASC) 5 MG tablet, Take 2 tablets (10 mg total) by mouth daily., Disp: 180 tablet, Rfl: 3   calcium-vitamin D (OSCAL WITH D) 500-5 MG-MCG tablet, Take 1 tablet by mouth daily with breakfast., Disp: 90 tablet, Rfl: 3   cyclobenzaprine (FLEXERIL) 10 MG tablet, Take 1 tablet (10 mg total) by mouth 3 (three) times daily as needed for muscle spasms., Disp: 60 tablet, Rfl: 1   diazepam (VALIUM) 5 MG tablet, Take 1 tablet (5 mg total) by mouth daily as needed for  anxiety., Disp: 20 tablet, Rfl: 0   diclofenac (VOLTAREN) 75 MG EC tablet, Take 1 tablet (75 mg total) by mouth 2 (two) times daily., Disp: 60 tablet, Rfl: 3   diclofenac Sodium (VOLTAREN) 1 % GEL, Apply 2 g topically 4 (four) times daily., Disp: 150 g, Rfl: 1   dicyclomine (BENTYL) 10 MG capsule, Take 1 capsule (10 mg total) by mouth 4 (four) times daily -  before meals and at bedtime., Disp: 90 capsule, Rfl: 2   fluticasone (FLONASE) 50 MCG/ACT nasal spray, USE 1 SPRAY IN EACH NOSTRIL 2 TIMES A DAY AS NEEDED FOR ALLERGIES, Disp: 16 g, Rfl: 6   HYDROcodone-acetaminophen (NORCO) 10-325 MG tablet, TAKE ONE TABLET EVERY 6 HOURS AS NEEDED FOR MODERATE PAIN, Disp: 120 tablet, Rfl: 0   ketoconazole (NIZORAL) 2 % shampoo, Leave in for 10 minutes then rinse 2 times per week for up to 4 weeks, Disp: 120 mL, Rfl: 1   lisinopril (ZESTRIL) 40 MG tablet, Take 1 tablet (40 mg total) by mouth daily., Disp: 90 tablet, Rfl: 3   nabumetone (RELAFEN) 750 MG tablet, Take 1 tablet (750 mg total) by mouth 2 (two) times daily as needed., Disp: 60 tablet, Rfl: 1   pantoprazole (PROTONIX) 40 MG tablet, Take 1 tablet (40 mg total) by mouth 2 (two) times daily before a meal., Disp: 180 tablet, Rfl: 3   rosuvastatin (CRESTOR) 10 MG tablet, Take 1 tablet (10 mg total) by mouth daily., Disp:  90 tablet, Rfl: 3   Semaglutide, 2 MG/DOSE, 8 MG/3ML SOPN, Inject 2 mg as directed once a week., Disp: 3 mL, Rfl: 5   tiZANidine (ZANAFLEX) 4 MG tablet, Take 1 tablet (4 mg total) by mouth every 8 (eight) hours as needed for muscle spasms., Disp: 40 tablet, Rfl: 1   venlafaxine XR (EFFEXOR XR) 37.5 MG 24 hr capsule, Take 1 capsule (37.5 mg total) by mouth daily., Disp: 30 capsule, Rfl: 2  Assessment/ Plan: 61 y.o. male   COVID-19 - Plan: molnupiravir EUA (LAGEVRIO) 200 mg CAPS capsule, benzonatate (TESSALON PERLES) 100 MG capsule  Molnupiravir, Tessalon Perles.  Home care instructions reviewed including adequate hydration.  Handout  provided in the [facility].  Understands red flag signs symptoms warranting further evaluation.  Note provided.  We will CC his pain specialist as FYI because he missed this appointment due to illness.  Hopefully they can waive his fee  Start time: 11:33am End time: 11:42am  Total time spent on patient care (including telephone call/ virtual visit): 9 minutes  [place], [place] [phone]

## 2022-06-14 ENCOUNTER — Ambulatory Visit: Payer: Medicare HMO | Admitting: Physical Medicine & Rehabilitation

## 2022-06-15 ENCOUNTER — Telehealth: Payer: Self-pay

## 2022-06-15 NOTE — Telephone Encounter (Signed)
Mr. Alex [name] has been advised to go to the nearest ER if  he is having withdrawals. We can not release early scripts. And prescribe other medications on at his request.

## 2022-06-15 NOTE — Telephone Encounter (Signed)
Patient is requesting Rx for Suboxone due to him having withdrawals and he complains of being sick without his medication and not feeling well, he has been out of his medication and last refill for his hydrocodone was 05/21/22 and can't get a refill until 06/18/22

## 2022-06-18 ENCOUNTER — Encounter: Payer: Medicare HMO | Attending: Physical Medicine & Rehabilitation | Admitting: Physical Medicine & Rehabilitation

## 2022-06-18 ENCOUNTER — Encounter: Payer: Self-pay | Admitting: Physical Medicine & Rehabilitation

## 2022-06-18 ENCOUNTER — Other Ambulatory Visit: Payer: Self-pay | Admitting: Family Medicine

## 2022-06-18 VITALS — BP 137/92 | Ht 66.0 in | Wt 211.0 lb

## 2022-06-18 DIAGNOSIS — M47816 Spondylosis without myelopathy or radiculopathy, lumbar region: Secondary | ICD-10-CM | POA: Diagnosis not present

## 2022-06-18 DIAGNOSIS — Z79891 Long term (current) use of opiate analgesic: Secondary | ICD-10-CM | POA: Diagnosis not present

## 2022-06-18 DIAGNOSIS — L219 Seborrheic dermatitis, unspecified: Secondary | ICD-10-CM

## 2022-06-18 DIAGNOSIS — M25552 Pain in left hip: Secondary | ICD-10-CM | POA: Insufficient documentation

## 2022-06-18 DIAGNOSIS — G894 Chronic pain syndrome: Secondary | ICD-10-CM | POA: Diagnosis not present

## 2022-06-18 DIAGNOSIS — Z5181 Encounter for therapeutic drug level monitoring: Secondary | ICD-10-CM | POA: Insufficient documentation

## 2022-06-18 MED ORDER — HYDROCODONE-ACETAMINOPHEN 10-325 MG PO TABS: 1.0000 | ORAL_TABLET | Freq: Every day | ORAL | 0 refills | Status: AC

## 2022-06-18 NOTE — Progress Notes (Signed)
Subjective:    Patient ID: Alex [name], male    DOB: 04-11-1961, 61 y.o.   MRN: [id number]  HPI HPI 61 year old male with past medical history of hypertension diabetes hyperlipidemia, right CTS, cervical spine degeneration status post cervical fusion C3-C5 2019 right total knee arthroplasty with revision in 2020 here for chronic pain.  He reports chronic back pain and knee pain for many years.  Pain is sharp and extension in his lower back. He has been back pain worse with extension as well as occasional shooting pain down his right leg. It was worsened a few weeks ago when he lifted a heavy item.  He uses Voltaren gel with improvement.  He was also prescribed hydrocodone which she reports using sparingly with benefit.  Flexeril tizanidine gabapentin and Lyrica did not help his pain.  Tramadol did not help the pain.  Tylenol and NSAIDs did not help.  He says he sometimes goes to the ER to help with pain treatment.  He has been working  with PT with some benefit.  He had a lumbar spinal epidural injection by Dr. Ernestina Patches August 22, 2021 with benefit but it did not last.  He is followed by Dr. Ninfa Linden orthopedics.  He was seen a couple years ago in 2021 by Dr. Posey Pronto [facility].   Visit  04/02/22     Alex [name] is here for follow-up of his chronic back pain.  Patient reports he had worsening pain after he fell off a tractor a couple weeks ago.  He reports he did not suffer any major injuries however the pain in his right knee and lower back were worsened temporarily.  He was having pain shooting down his left leg to his ankle.  He has been wearing a knee brace on his right lower extremity, reports the swelling and pain has improved.  He was seen by orthopedics Dr. Ninfa Linden and Dr.  Lorin Mercy for his knee and lower back.  She had a lumbar ESI by Dr. Ernestina Patches 03/27/2022.  His pain has improved since having his ESI completed.  He used ibuprofen for short period of time.  He is stopped using ibuprofen and is now using  Aleve occasionally.  He is also attempting to lose weight and his PCP started him on a weight loss medication.  He is also using a TENS unit to help with his pain.  Denies any side effects with the hydrocodone 7.5 mg and reports it is helping his pain and allowing him to be more functional.  He says he is able to do more at home and in his yard while using this medication.   Interval History 11/2 Alex [name] is here for follow-up of his chronic back pain.  He reports he is feeling down because he found out that his wife was cheating on him, they are planning to get a divorce.  He says he talked to his chaplain about this and this was beneficial.  Patient reports he is very angry with her but he has no plans to harm himself, his wife or her partner.  He says he would not want to do anything like this because of his religion and says she is not worth it.  He says he would be willing to talk with the psychologist about his decreased mood.  He did try duloxetine in the past but it made him feel a little weird.  He would also be willing to try a medication for mood.  He he continues to have  pain in his lower back.  He feels pain radiating to his left hip.   Interval History 11/2 Alex [name] is here for follow-up of his chronic back pain.  Patient was scheduled for UDS today however he declined to have this completed.  When discussing this further he reports he used marijuana for sleep about 2 weeks ago.  He continues to have pain in his back and hip.  He had an injection by Dr. Lorin Mercy of his left trochanteric bursa with improvement.   Pain Inventory Average Pain 7 Pain Right Now 8 My pain is sharp, stabbing, and aching  In the last 24 hours, has pain interfered with the following? General activity 7 Relation with others 4 Enjoyment of life 0 What TIME of day is your pain at its worst? morning , daytime, evening, and night Sleep (in general) Poor  Pain is worse with: walking, bending, and sitting Pain  improves with: rest, heat/ice, medication, and TENS Relief from Meds: 7  Family History  Problem Relation Age of Onset   Cancer Mother 39       small intestinal   Diabetes Father    Hypertension Father    Heart attack Father    Early death Sister    Brain cancer Brother    Colon cancer Neg Hx    Social History   Socioeconomic History   Marital status: Married    Spouse name: Not on file   Number of children: 2   Years of education: 12   Highest education level: 12th grade  Occupational History   Not on file  Tobacco Use   Smoking status: Never   Smokeless tobacco: Current    Types: Chew  Vaping Use   Vaping Use: Never used  Substance and Sexual Activity   Alcohol use: Not Currently    Comment: Quit EtOH 20 years ago   Drug use: No   Sexual activity: Not on file  Other Topics Concern   Not on file  Social History Narrative   Not on file   Social Determinants of Health   Financial Resource Strain: Not on file  Food Insecurity: Not on file  Transportation Needs: Not on file  Physical Activity: Not on file  Stress: Not on file  Social Connections: Not on file   Past Surgical History:  Procedure Laterality Date   ANTERIOR CERVICAL DECOMP/DISCECTOMY FUSION N/A 03/27/2018   Procedure: ANTERIOR CERVICAL DECOMPRESSION/DISCECTOMY FUSION C3-5;  Surgeon: Melina Schools, MD;  Location: [facility];  Service: Orthopedics;  Laterality: N/A;  3.5 hrs   arm surgery Right    x2 involving tendons/ulnar nerve   BACK SURGERY     BIOPSY  10/17/2020   Procedure: BIOPSY;  Surgeon: Eloise Harman, DO;  [facility];  Service: Endoscopy;;   CARPAL TUNNEL RELEASE Right    clavical surgery     ESOPHAGOGASTRODUODENOSCOPY (EGD) WITH PROPOFOL N/A 10/17/2020   Procedure: ESOPHAGOGASTRODUODENOSCOPY (EGD) WITH PROPOFOL;  Surgeon: Eloise Harman, DO;  [facility];  Service: Endoscopy;  Laterality: N/A;  PM   EYE SURGERY Bilateral    glaucoma   HERNIA REPAIR     JOINT  REPLACEMENT Right 06/2011   TOTAL KNEE REVISION Right 04/10/2019   Procedure: RIGHT TOTAL KNEE REVISION;  Surgeon: Mcarthur Rossetti, MD;  Location: [facility];  Service: Orthopedics;  Laterality: Right;   TRICEPS TENDON REPAIR     Past Surgical History:  Procedure Laterality Date   ANTERIOR CERVICAL DECOMP/DISCECTOMY FUSION N/A  03/27/2018   Procedure: ANTERIOR CERVICAL DECOMPRESSION/DISCECTOMY FUSION C3-5;  Surgeon: Melina Schools, MD;  Location: [facility];  Service: Orthopedics;  Laterality: N/A;  3.5 hrs   arm surgery Right    x2 involving tendons/ulnar nerve   BACK SURGERY     BIOPSY  10/17/2020   Procedure: BIOPSY;  Surgeon: Eloise Harman, DO;  [facility];  Service: Endoscopy;;   CARPAL TUNNEL RELEASE Right    clavical surgery     ESOPHAGOGASTRODUODENOSCOPY (EGD) WITH PROPOFOL N/A 10/17/2020   Procedure: ESOPHAGOGASTRODUODENOSCOPY (EGD) WITH PROPOFOL;  Surgeon: Eloise Harman, DO;  [facility];  Service: Endoscopy;  Laterality: N/A;  PM   EYE SURGERY Bilateral    glaucoma   HERNIA REPAIR     JOINT REPLACEMENT Right 06/2011   TOTAL KNEE REVISION Right 04/10/2019   Procedure: RIGHT TOTAL KNEE REVISION;  Surgeon: Mcarthur Rossetti, MD;  Location: [facility];  Service: Orthopedics;  Laterality: Right;   TRICEPS TENDON REPAIR     Past Medical History:  Diagnosis Date   Diabetes mellitus without complication ([facility])    History of kidney stones    removed 5 months ago   Hypertension    PONV (postoperative nausea and vomiting)    AGITATION after anesthesia as well   Sleep apnea    NO CPAP   Ht 5\' 6"  (1.676 m)   BMI 35.19 kg/m   Opioid Risk Score:   Fall Risk Score:  `1  Depression screen [facility] 2/9     05/10/2022   10:04 AM 03/19/2022    8:19 AM 02/13/2022   10:28 AM 12/18/2021   10:12 AM 12/08/2021    8:09 AM 11/17/2021    2:52 PM 09/01/2021    8:43 AM  Depression screen PHQ 2/9  Decreased Interest 3 2 0 1 1 3 1   Down, Depressed, Hopeless 3 1 0 1 0  1 1  PHQ - 2 Score 6 3 0 2 1 4 2   Altered sleeping  2   1 3 1   Tired, decreased energy  1   2 1 1   Change in appetite  1   1 1 1   Feeling bad or failure about yourself   0   3 3 1   Trouble concentrating  0   1 1 0  Moving slowly or fidgety/restless  0   0 0 0  Suicidal thoughts  0   1 1 0  PHQ-9 Score  7   10 14 6   Difficult doing work/chores Very difficult Not difficult at all   Very difficult Very difficult       Review of Systems  Musculoskeletal:  Positive for back pain.  All other systems reviewed and are negative.     Objective:   Physical Exam    Gen: no distress, normal appearing HEENT: oral mucosa pink and moist, NCAT Chest: normal effort, normal rate of breathing Abd: soft, non-distended Ext: no edema Psych: pleasant, normal affect Skin: intact Neuro: Alert, cranial nerves II through XII grossly intact, sensation intact in all 4 extremities coordination is normal no sensory deficits answers questions follows commands    Musculoskeletal:  + Lumbar paraspinal tenderness.   Strength 5 out of 5 in bilateral upper extremities.  Strength also 5 out of 5 in the lower extremities with some pain limitation at the hip He was noted to have some tenderness at his greater trochanter on the left, improved from last visit     L spine  xray 03/07/22 Impression: Lumbar facet arthropathy previous left SI joint fusion.    Pelvis xray 03/07/22 Impression: Left SI joint fusion.  Normal right SI joint.   Knee xray Right 8/30 1. Status post total knee arthroplasty. No evidence of acute fracture or hardware loosening. 2. Moderate joint effusion.   Knee L MRI 11/15/21   IMPRESSION: 1. Complete tear of the anterior cruciate ligament without associated hemorrhage in the intercondylar notch or acute osseous findings, likely due to a remote tear (ACL deficient knee). 2. No evidence of meniscal tear. Probable septated ganglion adjacent to the anterior horn of the medial meniscus. 3.  No acute ligamentous or osteochondral findings. 4. Minimal degenerative changes.   L Spine MRI 07/27/2021 Segmentation:  Standard.   Alignment:  Trace anterolisthesis of L5 over S1.   Vertebrae: No acute fracture, evidence of discitis, or bone lesion. Chronic superior endplate fracture with associated Schmorl node at T12 with approximately 50% height loss anteriorly. No retropulsion. Hemangiomas at L1, L3 and S2. Postsurgical changes from left sacroiliac joint fixation.   Conus medullaris and cauda equina: Conus extends to the L1 level. Conus and cauda equina appear normal.   Paraspinal and other soft tissues: Negative.   Disc levels:   T12-L1: No spinal canal or neural foraminal stenosis.   L1-2: Loss of disc height, shallow disc bulge with superimposed central disc protrusion mild facet degenerative changes resulting in mild spinal canal stenosis. No significant neural foraminal narrowing.   L2-3: No spinal canal or neural foraminal stenosis.   L3-4: Shallow disc bulge and mild facet degenerative changes without significant spinal canal or neural foraminal stenosis.   L4-5: Hypertrophic facet degenerative changes with bilateral joint effusion with articular process and periarticular soft tissue edema, left greater than right, suggesting active osteoarthritis. No significant spinal canal or neural foraminal stenosis.   L5-S1: Disc bulge/disc uncovering. Prominent hypertrophic facet degenerative changes with right-sided joint effusion, articular process and periarticular soft tissue edema, suggesting active osteoarthritis. Moderate right and severe left neural foraminal narrowing.   1. Degenerative changes of the lumbar spine, more pronounced at the level of the facet joints at L4-5 and L5-S1 where there is evidence of active osteoarthritis. 2. Moderate right and severe left neural foraminal narrowing at L5-S1.     Assessment & Plan:   IMPRESSION: Lumbar  spondylosis with multilevel degenerative changes of his facet joints and foraminal narrowing severe left moderate right at L5-S1, he has radicular symptoms on the left -Facet joint injections may be an option at later time -Poor results with duloxetine, elavil in past -Continue venlafaxine  -S/p lumbar ESI previously by Dr Alvester Morin with a few weeks of improvement, can consider repeating if needed at later time -Pt made no-controlled medication- reports use of marijuana 2 weeks ago   Marijuana abuse  -Reports use about 2 weeks ago   Bilateral knee pain with hx right total knee replacement, left hip pain -He follows with Dr. Magnus Ivan orthopedics -had L trochanteric bursa injection by ortho   Mood Disorder -Continue Venlafaxine  -Referral to mental health placed last visit

## 2022-06-20 ENCOUNTER — Ambulatory Visit (INDEPENDENT_AMBULATORY_CARE_PROVIDER_SITE_OTHER): Payer: Medicare HMO | Admitting: Family Medicine

## 2022-06-20 ENCOUNTER — Encounter: Payer: Self-pay | Admitting: Family Medicine

## 2022-06-20 VITALS — BP 113/69 | HR 64 | Temp 97.2°F | Ht 66.0 in | Wt 214.0 lb

## 2022-06-20 DIAGNOSIS — M792 Neuralgia and neuritis, unspecified: Secondary | ICD-10-CM | POA: Diagnosis not present

## 2022-06-20 DIAGNOSIS — E1159 Type 2 diabetes mellitus with other circulatory complications: Secondary | ICD-10-CM | POA: Diagnosis not present

## 2022-06-20 DIAGNOSIS — E1169 Type 2 diabetes mellitus with other specified complication: Secondary | ICD-10-CM

## 2022-06-20 DIAGNOSIS — E669 Obesity, unspecified: Secondary | ICD-10-CM

## 2022-06-20 DIAGNOSIS — E785 Hyperlipidemia, unspecified: Secondary | ICD-10-CM | POA: Diagnosis not present

## 2022-06-20 DIAGNOSIS — I152 Hypertension secondary to endocrine disorders: Secondary | ICD-10-CM

## 2022-06-20 LAB — BAYER DCA HB A1C WAIVED: HB A1C (BAYER DCA - WAIVED): 6.2 % — ABNORMAL HIGH (ref 4.8–5.6)

## 2022-06-20 NOTE — Progress Notes (Signed)
BP 113/69   Pulse 64   Temp (!) 97.2 F (36.2 C)   Ht _0  (1.676 m)   Wt 214 lb (97.1 kg)   SpO2 98%   BMI 34.54 kg/m    Subjective:   Patient ID: Alex [name], male    DOB: [birth year], 61 y.o.   MRN: [id number]  HPI: Alex [name] is a 61 y.o. male presenting on 06/20/2022 for Medical Management of Chronic Issues and Diabetes   HPI Type 2 diabetes mellitus Patient comes in today for recheck of his diabetes. Patient has been currently taking Ozempic. Patient is currently on an ACE inhibitor/ARB. Patient has seen an ophthalmologist this year. Patient denies any new issues with their feet. The symptom started onset as an adult neuropathy and hypertension and hyperlipidemia ARE RELATED TO DM   Hypertension Patient is currently on amlodipine and lisinopril, and their blood pressure today is 113/69. Patient denies any lightheadedness or dizziness. Patient denies headaches, blurred vision, chest pains, shortness of breath, or weakness. Denies any side effects from medication and is content with current medication.   Hyperlipidemia Patient is coming in for recheck of his hyperlipidemia. The patient is currently taking Crestor. They deny any issues with myalgias or history of liver damage from it. They deny any focal numbness or weakness or chest pain.   Relevant past medical, surgical, family and social history reviewed and updated as indicated. Interim medical history since our last visit reviewed. Allergies and medications reviewed and updated.  Review of Systems  Constitutional:  Negative for chills and fever.  Eyes:  Negative for visual disturbance.  Respiratory:  Negative for shortness of breath and wheezing.   Cardiovascular:  Negative for chest pain and leg swelling.  Musculoskeletal:  Positive for arthralgias and back pain. Negative for gait problem.  Skin:  Negative for rash.  Neurological:  Negative for dizziness.  All other systems reviewed and are negative.   Per  HPI unless specifically indicated above   Allergies as of 06/20/2022       Reactions   Morphine And Related Nausea Only   Buprenorphine Hcl-naloxone Hcl Nausea Only   Codeine Nausea Only, Other (See Comments)   per pt, "felt weird in my head"        Medication List        Accurate as of June 20, 2022  8:37 AM. If you have any questions, ask your nurse or doctor.          amLODipine 5 MG tablet Commonly known as: NORVASC Take 2 tablets (10 mg total) by mouth daily.   benzonatate 100 MG capsule Commonly known as: Tessalon Perles Take 1 capsule (100 mg total) by mouth 3 (three) times daily as needed for cough.   calcium-vitamin D 500-5 MG-MCG tablet Commonly known as: OSCAL WITH D Take 1 tablet by mouth daily with breakfast.   cyclobenzaprine 10 MG tablet Commonly known as: FLEXERIL Take 1 tablet (10 mg total) by mouth 3 (three) times daily as needed for muscle spasms.   diazepam 5 MG tablet Commonly known as: VALIUM Take 1 tablet (5 mg total) by mouth daily as needed for anxiety.   diclofenac 75 MG EC tablet Commonly known as: VOLTAREN Take 1 tablet (75 mg total) by mouth 2 (two) times daily.   diclofenac Sodium 1 % Gel Commonly known as: Voltaren Apply 2 g topically 4 (four) times daily.   dicyclomine 10 MG capsule Commonly known as: Bentyl Take 1 capsule (10 mg total)  by mouth 4 (four) times daily -  before meals and at bedtime.   fluticasone 50 MCG/ACT nasal spray Commonly known as: FLONASE USE 1 SPRAY IN EACH NOSTRIL 2 TIMES A DAY AS NEEDED FOR ALLERGIES   HYDROcodone-acetaminophen 10-325 MG tablet Commonly known as: NORCO Take 1 tablet by mouth daily. Take 4 tablets per day for one week   #28 Take 3 tablets per day for one week   #21 Take 2 tablets per day for one week   #14 Take 1 tablet per day for one week      #7  then stop Total #70 tablets   ketoconazole 2 % shampoo Commonly known as: NIZORAL LEAVE in FOR 10 minutes, THEN RINSE twice PER  WEEK FOR UP TO FOUR WEEKS   lisinopril 40 MG tablet Commonly known as: ZESTRIL Take 1 tablet (40 mg total) by mouth daily.   nabumetone 750 MG tablet Commonly known as: RELAFEN Take 1 tablet (750 mg total) by mouth 2 (two) times daily as needed.   pantoprazole 40 MG tablet Commonly known as: PROTONIX Take 1 tablet (40 mg total) by mouth 2 (two) times daily before a meal.   rosuvastatin 10 MG tablet Commonly known as: CRESTOR Take 1 tablet (10 mg total) by mouth daily.   Semaglutide (2 MG/DOSE) 8 MG/3ML Sopn Inject 2 mg as directed once a week.   tiZANidine 4 MG tablet Commonly known as: Zanaflex Take 1 tablet (4 mg total) by mouth every 8 (eight) hours as needed for muscle spasms.   venlafaxine XR 37.5 MG 24 hr capsule Commonly known as: Effexor XR Take 1 capsule (37.5 mg total) by mouth daily.         Objective:   BP 113/69   Pulse 64   Temp (!) 97.2 F (36.2 C)   Ht _0  (1.676 m)   Wt 214 lb (97.1 kg)   SpO2 98%   BMI 34.54 kg/m   Wt Readings from Last 3 Encounters:  06/20/22 214 lb (97.1 kg)  06/18/22 211 lb (95.7 kg)  05/16/22 218 lb (98.9 kg)    Physical Exam Vitals and nursing note reviewed.  Constitutional:      General: He is not in acute distress.    Appearance: He is well-developed. He is not diaphoretic.  Eyes:     General: No scleral icterus.    Conjunctiva/sclera: Conjunctivae normal.  Neck:     Thyroid: No thyromegaly.  Cardiovascular:     Rate and Rhythm: Normal rate and regular rhythm.     Heart sounds: Normal heart sounds. No murmur heard. Pulmonary:     Effort: Pulmonary effort is normal. No respiratory distress.     Breath sounds: Normal breath sounds. No wheezing.  Musculoskeletal:        General: No swelling. Normal range of motion.     Cervical back: Neck supple.  Lymphadenopathy:     Cervical: No cervical adenopathy.  Skin:    General: Skin is warm and dry.     Findings: No rash.  Neurological:     Mental Status: He  is alert and oriented to person, place, and time.     Coordination: Coordination normal.  Psychiatric:        Behavior: Behavior normal.     Results for orders placed or performed in visit on 05/10/22  HM DIABETES EYE EXAM  Result Value Ref Range   HM Diabetic Eye Exam No Retinopathy No Retinopathy    Assessment & Plan:  Problem List Items Addressed This Visit       Cardiovascular and Mediastinum   Hypertension associated with diabetes ([place])   Relevant Orders   CBC with Differential/Platelet   CMP14+EGFR   Lipid panel   Bayer DCA Hb A1c Waived     Endocrine   Diabetes mellitus type 2 in obese ([place]) - Primary   Relevant Orders   CBC with Differential/Platelet   CMP14+EGFR   Lipid panel   Bayer DCA Hb A1c Waived   Microalbumin / creatinine urine ratio   Hyperlipidemia associated with type 2 diabetes mellitus ([place])   Relevant Orders   CBC with Differential/Platelet   CMP14+EGFR   Lipid panel   Bayer DCA Hb A1c Waived     Other   Neuropathic pain    A1c looks good at 6.2, continue to focus on diet.  No changes. Follow up plan: Return in about 3 months (around 09/19/2022), or if symptoms worsen or fail to improve, for Diabetes hypertension and cholesterol.  Counseling provided for all of the vaccine components Orders Placed This Encounter  Procedures   CBC with Differential/Platelet   CMP14+EGFR   Lipid panel   Bayer DCA Hb A1c Waived    Caryl Pina, MD [facility] 06/20/2022, 8:37 AM

## 2022-06-21 LAB — CBC WITH DIFFERENTIAL/PLATELET
Basophils Absolute: 0 10*3/uL (ref 0.0–0.2)
Basos: 0 %
EOS (ABSOLUTE): 0.1 10*3/uL (ref 0.0–0.4)
Eos: 1 %
Hematocrit: 35.9 % — ABNORMAL LOW (ref 37.5–51.0)
Hemoglobin: 12.4 g/dL — ABNORMAL LOW (ref 13.0–17.7)
Immature Grans (Abs): 0 10*3/uL (ref 0.0–0.1)
Immature Granulocytes: 0 %
Lymphocytes Absolute: 2 10*3/uL (ref 0.7–3.1)
Lymphs: 31 %
MCH: 28.9 pg (ref 26.6–33.0)
MCHC: 34.5 g/dL (ref 31.5–35.7)
MCV: 84 fL (ref 79–97)
Monocytes Absolute: 0.4 10*3/uL (ref 0.1–0.9)
Monocytes: 7 %
Neutrophils Absolute: 4 10*3/uL (ref 1.4–7.0)
Neutrophils: 61 %
Platelets: 198 10*3/uL (ref 150–450)
RBC: 4.29 x10E6/uL (ref 4.14–5.80)
RDW: 13 % (ref 11.6–15.4)
WBC: 6.6 10*3/uL (ref 3.4–10.8)

## 2022-06-21 LAB — CMP14+EGFR
ALT: 36 IU/L (ref 0–44)
AST: 23 IU/L (ref 0–40)
Albumin/Globulin Ratio: 2.4 — ABNORMAL HIGH (ref 1.2–2.2)
Albumin: 4.3 g/dL (ref 3.9–4.9)
Alkaline Phosphatase: 68 IU/L (ref 44–121)
BUN/Creatinine Ratio: 12 (ref 10–24)
BUN: 9 mg/dL (ref 8–27)
Bilirubin Total: 0.4 mg/dL (ref 0.0–1.2)
CO2: 21 mmol/L (ref 20–29)
Calcium: 9 mg/dL (ref 8.6–10.2)
Chloride: 103 mmol/L (ref 96–106)
Creatinine, Ser: 0.74 mg/dL — ABNORMAL LOW (ref 0.76–1.27)
Globulin, Total: 1.8 g/dL (ref 1.5–4.5)
Glucose: 98 mg/dL (ref 70–99)
Potassium: 4.2 mmol/L (ref 3.5–5.2)
Sodium: 140 mmol/L (ref 134–144)
Total Protein: 6.1 g/dL (ref 6.0–8.5)
eGFR: 103 mL/min/{1.73_m2} (ref >59)

## 2022-06-21 LAB — MICROALBUMIN / CREATININE URINE RATIO
Creatinine, Urine: 15.7 mg/dL
Microalb/Creat Ratio: <19 mg/g creat (ref 0–29)
Microalbumin, Urine: <3 ug/mL

## 2022-06-21 LAB — LIPID PANEL
Chol/HDL Ratio: 2.4 ratio (ref 0.0–5.0)
Cholesterol, Total: 112 mg/dL (ref 100–199)
HDL: 47 mg/dL (ref >39)
LDL Chol Calc (NIH): 51 mg/dL (ref 0–99)
Triglycerides: 67 mg/dL (ref 0–149)
VLDL Cholesterol Cal: 14 mg/dL (ref 5–40)

## 2022-06-28 ENCOUNTER — Telehealth: Payer: Self-pay | Admitting: Family Medicine

## 2022-07-11 ENCOUNTER — Telehealth: Payer: Self-pay | Admitting: Family Medicine

## 2022-07-11 DIAGNOSIS — M47819 Spondylosis without myelopathy or radiculopathy, site unspecified: Secondary | ICD-10-CM | POA: Diagnosis not present

## 2022-07-11 DIAGNOSIS — I10 Essential (primary) hypertension: Secondary | ICD-10-CM | POA: Diagnosis not present

## 2022-07-11 DIAGNOSIS — G8929 Other chronic pain: Secondary | ICD-10-CM | POA: Diagnosis not present

## 2022-07-11 DIAGNOSIS — Z6834 Body mass index (BMI) 34.0-34.9, adult: Secondary | ICD-10-CM | POA: Diagnosis not present

## 2022-07-11 DIAGNOSIS — Z79899 Other long term (current) drug therapy: Secondary | ICD-10-CM | POA: Diagnosis not present

## 2022-07-11 DIAGNOSIS — M549 Dorsalgia, unspecified: Secondary | ICD-10-CM | POA: Diagnosis not present

## 2022-07-11 DIAGNOSIS — E119 Type 2 diabetes mellitus without complications: Secondary | ICD-10-CM | POA: Diagnosis not present

## 2022-07-11 DIAGNOSIS — E559 Vitamin D deficiency, unspecified: Secondary | ICD-10-CM | POA: Diagnosis not present

## 2022-07-11 DIAGNOSIS — Z0130 Encounter for examination of blood pressure without abnormal findings: Secondary | ICD-10-CM | POA: Diagnosis not present

## 2022-07-11 NOTE — Telephone Encounter (Signed)
Explained to patient that he is not on a blood thinner. Informed that he takes BP meds and Cholesterol meds.  Amlodipine, Crestor and Lisinopril. Instructions and strengths given to pt.

## 2022-07-16 ENCOUNTER — Telehealth: Payer: Self-pay | Admitting: Family Medicine

## 2022-07-17 ENCOUNTER — Ambulatory Visit: Payer: Medicare HMO | Admitting: Psychologist

## 2022-07-17 DIAGNOSIS — Z79899 Other long term (current) drug therapy: Secondary | ICD-10-CM | POA: Diagnosis not present

## 2022-07-18 DIAGNOSIS — I10 Essential (primary) hypertension: Secondary | ICD-10-CM | POA: Diagnosis not present

## 2022-07-18 DIAGNOSIS — M792 Neuralgia and neuritis, unspecified: Secondary | ICD-10-CM | POA: Diagnosis not present

## 2022-07-18 DIAGNOSIS — Z79899 Other long term (current) drug therapy: Secondary | ICD-10-CM | POA: Diagnosis not present

## 2022-07-18 DIAGNOSIS — M47819 Spondylosis without myelopathy or radiculopathy, site unspecified: Secondary | ICD-10-CM | POA: Diagnosis not present

## 2022-07-18 DIAGNOSIS — M62830 Muscle spasm of back: Secondary | ICD-10-CM | POA: Diagnosis not present

## 2022-07-18 DIAGNOSIS — Z0130 Encounter for examination of blood pressure without abnormal findings: Secondary | ICD-10-CM | POA: Diagnosis not present

## 2022-07-18 DIAGNOSIS — M549 Dorsalgia, unspecified: Secondary | ICD-10-CM | POA: Diagnosis not present

## 2022-07-18 DIAGNOSIS — M129 Arthropathy, unspecified: Secondary | ICD-10-CM | POA: Diagnosis not present

## 2022-07-18 DIAGNOSIS — E119 Type 2 diabetes mellitus without complications: Secondary | ICD-10-CM | POA: Diagnosis not present

## 2022-07-18 DIAGNOSIS — Z6835 Body mass index (BMI) 35.0-35.9, adult: Secondary | ICD-10-CM | POA: Diagnosis not present

## 2022-07-19 ENCOUNTER — Other Ambulatory Visit (HOSPITAL_COMMUNITY): Payer: Self-pay

## 2022-07-24 DIAGNOSIS — M47819 Spondylosis without myelopathy or radiculopathy, site unspecified: Secondary | ICD-10-CM | POA: Diagnosis not present

## 2022-08-14 NOTE — Patient Instructions (Incomplete)
Alex [name] , Thank you for taking time to come for your Medicare Wellness Visit. I appreciate your ongoing commitment to your health goals. Please review the following plan we discussed and let me know if I can assist you in the future.   These are the goals we discussed:  Goals       AWV      07/14/2021 AWV Goal: Fall Prevention  Over the next year, patient will decrease their risk for falls by: Using assistive devices, such as a cane or walker, as needed Identifying fall risks within their home and correcting them by: Removing throw rugs Adding handrails to stairs or ramps Removing clutter and keeping a clear pathway throughout the home Increasing light, especially at night Adding shower handles/bars Raising toilet seat Identifying potential personal risk factors for falls: Medication side effects Incontinence/urgency Vestibular dysfunction Hearing loss Musculoskeletal disorders Neurological disorders Orthostatic hypotension  07/14/2021 AWV Goal: Diabetes Management  Patient will maintain an A1C level below 8.0 Patient will not develop any diabetic foot complications Patient will not experience any hypoglycemic episodes over the next 3 months Patient will notify our office of any CBG readings outside of the provider recommended range by calling [phone] Patient will adhere to provider recommendations for diabetes management  Patient Self Management Activities take all medications as prescribed and report any negative side effects monitor and record blood sugar readings as directed adhere to a low carbohydrate diet that incorporates lean proteins, vegetables, whole grains, low glycemic fruits check feet daily noting any sores, cracks, injuries, or callous formations see PCP or podiatrist if he notices any changes in his legs, feet, or toenails Patient will visit PCP and have an A1C level checked every 3 to 6 months as directed  have a yearly eye exam to monitor for  vascular changes associated with diabetes and will request that the report be sent to his pcp.  consult with his PCP regarding any changes in his health or new or worsening symptoms       T2DM, HTN PHARMD GOAL (pt-stated)      Current Barriers:  Unable to independently afford treatment regimen Unable to maintain control of T2DM, HLD, HTN  Pharmacist Clinical Goal(s):  patient will verbalize ability to afford treatment regimen maintain control of T2DM, HLD, HTN as evidenced by GOAL A1C<7%, LDL<70, BP<130/80  through collaboration with PharmD and provider.    Interventions: 1:1 collaboration with Dettinger, Fransisca Kaufmann, MD regarding development and update of comprehensive plan of care as evidenced by provider attestation and co-signature Inter-disciplinary care team collaboration (see longitudinal plan of care) Comprehensive medication review performed; medication list updated in electronic medical record  Diabetes: Goal on Track (progressing): YES. Controlled-A1c 6.0-->5.6%, GFR>98; current treatment: OZEMPIC 1MG -->increasing to 2mg  weekly;  We are increasing dose to Ozempic 2mg  weekly for weight loss  Denies personal and family history of Medullary thyroid cancer (MTC) Dose change form submitted to [employer] Patient Assistance Program (Ozempic 2mg ) Current glucose readings: fasting glucose: <120, post prandial glucose: <170 Denies hypoglycemic/hyperglycemic symptoms Discussed meal planning options and Plate method for healthy eating Avoid sugary drinks and desserts Incorporate balanced protein, non starchy veggies, 1 serving of carbohydrate with each meal Increase water intake Increase physical activity as able Current exercise: n/a; encouraged-has back pain/issues Recommended increase Ozempic to max dose Assessed patient finances. oDose change form submitted to [employer] Patient Assistance Program (Ozempic 2mg );; approved until 06/07/2022  Hypertension:  Goal on Track  (progressing): YES. Uncontrolled; current treatment:amlodipine, lisinopril 40mg ;  Current home readings: 140s SBP Denies hypertensive symptoms Educated on DASH diet; weight loss Recommended close follow up; may alter regimen at f/u; patient to try diet/lifestyle   Patient Goals/Self-Care Activities patient will:  - take medications as prescribed as evidenced by patient report and record review check glucose DAILY OR IF SYMPTOMATIC, document, and provide at future appointments check blood pressure 3X WEEKLY OR IF SYMPTOMATIC, document, and provide at future appointments collaborate with provider on medication access solutions target a minimum of 150 minutes of moderate intensity exercise weekly engage in dietary modifications by  FOLLOWING A HEART HEALTHY DIET/HEALTHY PLATE METHOD          This is a list of the screening recommended for you and due dates:  Health Maintenance  Topic Date Due   COVID-19 Vaccine (3 - 2023-24 season) 03/09/2022   Zoster (Shingles) Vaccine (1 of 2) 03/20/2023*   Colon Cancer Screening  06/21/2023*   Complete foot exam   09/01/2022   Hemoglobin A1C  12/20/2022   Eye exam for diabetics  04/20/2023   Yearly kidney function blood test for diabetes  06/21/2023   Yearly kidney health urinalysis for diabetes  06/21/2023   Medicare Annual Wellness Visit  08/16/2023   DTaP/Tdap/Td vaccine (2 - Td or Tdap) 05/25/2031   Flu Shot  Completed   HIV Screening  Completed   HPV Vaccine  Aged Out   Hepatitis C Screening: USPSTF Recommendation to screen - Ages 18-79 yo.  Discontinued  *Topic was postponed. The date shown is not the original due date.    Advanced directives: Forms are available if you choose in the future to pursue completion.  This is recommended in order to make sure that your health wishes are honored in the event that you are unable to verbalize them to the provider.    Conditions/risks identified: Aim for 30 minutes of exercise or brisk  walking, 6-8 glasses of water, and 5 servings of fruits and vegetables each day.   Next appointment: Follow up in one year for your annual wellness visit   Preventive Care 40-64 Years, Male Preventive care refers to lifestyle choices and visits with your health care provider that can promote health and wellness. What does preventive care include? A yearly physical exam. This is also called an annual well check. Dental exams once or twice a year. Routine eye exams. Ask your health care provider how often you should have your eyes checked. Personal lifestyle choices, including: Daily care of your teeth and gums. Regular physical activity. Eating a healthy diet. Avoiding tobacco and drug use. Limiting alcohol use. Practicing safe sex. Taking low-dose aspirin every day starting at age 38. What happens during an annual well check? The services and screenings done by your health care provider during your annual well check will depend on your age, overall health, lifestyle risk factors, and family history of disease. Counseling  Your health care provider may ask you questions about your: Alcohol use. Tobacco use. Drug use. Emotional well-being. Home and relationship well-being. Sexual activity. Eating habits. Work and work Statistician. Screening  You may have the following tests or measurements: Height, weight, and BMI. Blood pressure. Lipid and cholesterol levels. These may be checked every 5 years, or more frequently if you are over 78 years old. Skin check. Lung cancer screening. You may have this screening every year starting at age 58 if you have a 30-pack-year history of smoking and currently smoke or have quit within the past 15 years. Fecal  occult blood test (FOBT) of the stool. You may have this test every year starting at age 53. Flexible sigmoidoscopy or colonoscopy. You may have a sigmoidoscopy every 5 years or a colonoscopy every 10 years starting at age 75. Prostate  cancer screening. Recommendations will vary depending on your family history and other risks. Hepatitis C blood test. Hepatitis B blood test. Sexually transmitted disease (STD) testing. Diabetes screening. This is done by checking your blood sugar (glucose) after you have not eaten for a while (fasting). You may have this done every 1-3 years. Discuss your test results, treatment options, and if necessary, the need for more tests with your health care provider. Vaccines  Your health care provider may recommend certain vaccines, such as: Influenza vaccine. This is recommended every year. Tetanus, diphtheria, and acellular pertussis (Tdap, Td) vaccine. You may need a Td booster every 10 years. Zoster vaccine. You may need this after age 43. Pneumococcal 13-valent conjugate (PCV13) vaccine. You may need this if you have certain conditions and have not been vaccinated. Pneumococcal polysaccharide (PPSV23) vaccine. You may need one or two doses if you smoke cigarettes or if you have certain conditions. Talk to your health care provider about which screenings and vaccines you need and how often you need them. This information is not intended to replace advice given to you by your health care provider. Make sure you discuss any questions you have with your health care provider. Document Released: 07/22/2015 Document Revised: 03/14/2016 Document Reviewed: 04/26/2015 Elsevier Interactive Patient Education  2017 [place] Prevention in the Home Falls can cause injuries. They can happen to people of all ages. There are many things you can do to make your home safe and to help prevent falls. What can I do on the outside of my home? Regularly fix the edges of walkways and driveways and fix any cracks. Remove anything that might make you trip as you walk through a door, such as a raised step or threshold. Trim any bushes or trees on the path to your home. Use bright outdoor lighting. Clear any  walking paths of anything that might make someone trip, such as rocks or tools. Regularly check to see if handrails are loose or broken. Make sure that both sides of any steps have handrails. Any raised decks and porches should have guardrails on the edges. Have any leaves, snow, or ice cleared regularly. Use sand or salt on walking paths during winter. Clean up any spills in your garage right away. This includes oil or grease spills. What can I do in the bathroom? Use night lights. Install grab bars by the toilet and in the tub and shower. Do not use towel bars as grab bars. Use non-skid mats or decals in the tub or shower. If you need to sit down in the shower, use a plastic, non-slip stool. Keep the floor dry. Clean up any water that spills on the floor as soon as it happens. Remove soap buildup in the tub or shower regularly. Attach bath mats securely with double-sided non-slip rug tape. Do not have throw rugs and other things on the floor that can make you trip. What can I do in the bedroom? Use night lights. Make sure that you have a light by your bed that is easy to reach. Do not use any sheets or blankets that are too big for your bed. They should not hang down onto the floor. Have a firm chair that has side arms. You can  use this for support while you get dressed. Do not have throw rugs and other things on the floor that can make you trip. What can I do in the kitchen? Clean up any spills right away. Avoid walking on wet floors. Keep items that you use a lot in easy-to-reach places. If you need to reach something above you, use a strong step stool that has a grab bar. Keep electrical cords out of the way. Do not use floor polish or wax that makes floors slippery. If you must use wax, use non-skid floor wax. Do not have throw rugs and other things on the floor that can make you trip. What can I do with my stairs? Do not leave any items on the stairs. Make sure that there are  handrails on both sides of the stairs and use them. Fix handrails that are broken or loose. Make sure that handrails are as long as the stairways. Check any carpeting to make sure that it is firmly attached to the stairs. Fix any carpet that is loose or worn. Avoid having throw rugs at the top or bottom of the stairs. If you do have throw rugs, attach them to the floor with carpet tape. Make sure that you have a light switch at the top of the stairs and the bottom of the stairs. If you do not have them, ask someone to add them for you. What else can I do to help prevent falls? Wear shoes that: Do not have high heels. Have rubber bottoms. Are comfortable and fit you well. Are closed at the toe. Do not wear sandals. If you use a stepladder: Make sure that it is fully opened. Do not climb a closed stepladder. Make sure that both sides of the stepladder are locked into place. Ask someone to hold it for you, if possible. Clearly mark and make sure that you can see: Any grab bars or handrails. First and last steps. Where the edge of each step is. Use tools that help you move around (mobility aids) if they are needed. These include: Canes. Walkers. Scooters. Crutches. Turn on the lights when you go into a dark area. Replace any light bulbs as soon as they burn out. Set up your furniture so you have a clear path. Avoid moving your furniture around. If any of your floors are uneven, fix them. If there are any pets around you, be aware of where they are. Review your medicines with your doctor. Some medicines can make you feel dizzy. This can increase your chance of falling. Ask your doctor what other things that you can do to help prevent falls. This information is not intended to replace advice given to you by your health care provider. Make sure you discuss any questions you have with your health care provider. Document Released: 04/21/2009 Document Revised: 12/01/2015 Document Reviewed:  07/30/2014 Elsevier Interactive Patient Education  2017 [employer].

## 2022-08-14 NOTE — Progress Notes (Signed)
Subjective:   Alex [name] is a 62 y.o. male who presents for Medicare Annual/Subsequent preventive examination.  Review of Systems    ***       Objective:    There were no vitals filed for this visit. There is no height or weight on file to calculate BMI.     02/22/2022    8:15 PM 11/29/2021    5:16 PM 07/14/2021    8:38 AM 06/30/2021   12:32 AM 12/21/2020    8:30 AM 10/17/2020   11:38 AM 09/12/2020    4:13 PM  Advanced Directives  Does Patient Have a Medical Advance Directive? No No No No No No No  Would patient like information on creating a medical advance directive? No - Patient declined No - Patient declined No - Patient declined No - Patient declined  No - Patient declined No - Patient declined    Current Medications (verified) Outpatient Encounter Medications as of 08/15/2022  Medication Sig   amLODipine (NORVASC) 5 MG tablet Take 2 tablets (10 mg total) by mouth daily.   benzonatate (TESSALON PERLES) 100 MG capsule Take 1 capsule (100 mg total) by mouth 3 (three) times daily as needed for cough.   calcium-vitamin D (OSCAL WITH D) 500-5 MG-MCG tablet Take 1 tablet by mouth daily with breakfast.   cyclobenzaprine (FLEXERIL) 10 MG tablet Take 1 tablet (10 mg total) by mouth 3 (three) times daily as needed for muscle spasms.   diazepam (VALIUM) 5 MG tablet Take 1 tablet (5 mg total) by mouth daily as needed for anxiety.   diclofenac (VOLTAREN) 75 MG EC tablet Take 1 tablet (75 mg total) by mouth 2 (two) times daily.   diclofenac Sodium (VOLTAREN) 1 % GEL Apply 2 g topically 4 (four) times daily.   dicyclomine (BENTYL) 10 MG capsule Take 1 capsule (10 mg total) by mouth 4 (four) times daily -  before meals and at bedtime.   fluticasone (FLONASE) 50 MCG/ACT nasal spray USE 1 SPRAY IN EACH NOSTRIL 2 TIMES A DAY AS NEEDED FOR ALLERGIES   HYDROcodone-acetaminophen (NORCO) 10-325 MG tablet Take 1 tablet by mouth daily. Take 4 tablets per day for one week   #28 Take 3 tablets per day  for one week   #21 Take 2 tablets per day for one week   #14 Take 1 tablet per day for one week      #7  then stop Total #70 tablets   ketoconazole (NIZORAL) 2 % shampoo LEAVE in FOR 10 minutes, THEN RINSE twice PER WEEK FOR UP TO FOUR WEEKS   lisinopril (ZESTRIL) 40 MG tablet Take 1 tablet (40 mg total) by mouth daily.   nabumetone (RELAFEN) 750 MG tablet Take 1 tablet (750 mg total) by mouth 2 (two) times daily as needed.   pantoprazole (PROTONIX) 40 MG tablet Take 1 tablet (40 mg total) by mouth 2 (two) times daily before a meal.   rosuvastatin (CRESTOR) 10 MG tablet Take 1 tablet (10 mg total) by mouth daily.   Semaglutide, 2 MG/DOSE, 8 MG/3ML SOPN Inject 2 mg as directed once a week.   tiZANidine (ZANAFLEX) 4 MG tablet Take 1 tablet (4 mg total) by mouth every 8 (eight) hours as needed for muscle spasms.   venlafaxine XR (EFFEXOR XR) 37.5 MG 24 hr capsule Take 1 capsule (37.5 mg total) by mouth daily.   No facility-administered encounter medications on file as of 08/15/2022.    Allergies (verified) Morphine and related, Buprenorphine hcl-naloxone hcl,  and Codeine   History: Past Medical History:  Diagnosis Date   Diabetes mellitus without complication ([place])    History of kidney stones    removed 5 months ago   Hypertension    PONV (postoperative nausea and vomiting)    AGITATION after anesthesia as well   Sleep apnea    NO CPAP   Past Surgical History:  Procedure Laterality Date   ANTERIOR CERVICAL DECOMP/DISCECTOMY FUSION N/A 03/27/2018   Procedure: ANTERIOR CERVICAL DECOMPRESSION/DISCECTOMY FUSION C3-5;  Surgeon: Melina Schools, MD;  Location: [facility];  Service: Orthopedics;  Laterality: N/A;  3.5 hrs   arm surgery Right    x2 involving tendons/ulnar nerve   BACK SURGERY     BIOPSY  10/17/2020   Procedure: BIOPSY;  Surgeon: Eloise Harman, DO;  [facility];  Service: Endoscopy;;   CARPAL TUNNEL RELEASE Right    clavical surgery     ESOPHAGOGASTRODUODENOSCOPY  (EGD) WITH PROPOFOL N/A 10/17/2020   Procedure: ESOPHAGOGASTRODUODENOSCOPY (EGD) WITH PROPOFOL;  Surgeon: Eloise Harman, DO;  [facility];  Service: Endoscopy;  Laterality: N/A;  PM   EYE SURGERY Bilateral    glaucoma   HERNIA REPAIR     JOINT REPLACEMENT Right 06/2011   TOTAL KNEE REVISION Right 04/10/2019   Procedure: RIGHT TOTAL KNEE REVISION;  Surgeon: Mcarthur Rossetti, MD;  Location: [facility];  Service: Orthopedics;  Laterality: Right;   TRICEPS TENDON REPAIR     Family History  Problem Relation Age of Onset   Cancer Mother 65       small intestinal   Diabetes Father    Hypertension Father    Heart attack Father    Early death Sister    Brain cancer Brother    Colon cancer Neg Hx    Social History   Socioeconomic History   Marital status: Married    Spouse name: Not on file   Number of children: 2   Years of education: 12   Highest education level: 12th grade  Occupational History   Not on file  Tobacco Use   Smoking status: Never   Smokeless tobacco: Current    Types: Chew  Vaping Use   Vaping Use: Never used  Substance and Sexual Activity   Alcohol use: Not Currently    Comment: Quit EtOH 20 years ago   Drug use: No   Sexual activity: Not on file  Other Topics Concern   Not on file  Social History Narrative   Not on file   Social Determinants of Health   Financial Resource Strain: Not on file  Food Insecurity: Not on file  Transportation Needs: Not on file  Physical Activity: Not on file  Stress: Not on file  Social Connections: Not on file    Tobacco Counseling Ready to quit: Not Answered Counseling given: Not Answered   Clinical Intake:                 Diabetic?Yes  Nutrition Risk Assessment:  Has the patient had any N/V/D within the last 2 months?  {YES/NO:21197} Does the patient have any non-healing wounds?  {YES/NO:21197} Has the patient had any unintentional weight loss or weight gain?   {YES/NO:21197}  Diabetes:  Is the patient diabetic?  {YES/NO:21197} If diabetic, was a CBG obtained today?  {YES/NO:21197} Did the patient bring in their glucometer from home?  {YES/NO:21197} How often do you monitor your CBG's? ***.   Financial Strains and Diabetes Management:  Are you having  any financial strains with the device, your supplies or your medication? {YES/NO:21197}.  Does the patient want to be seen by Chronic Care Management for management of their diabetes?  {YES/NO:21197} Would the patient like to be referred to a Nutritionist or for Diabetic Management?  {YES/NO:21197}  Diabetic Exams:  Diabetic Eye Exam: Completed 04/19/22 Diabetic Foot Exam: Completed 09/01/21           Activities of Daily Living     No data to display          Patient Care Team: Dettinger, Fransisca Kaufmann, MD as PCP - General (Family Medicine) Eloise Harman, DO as Consulting Physician (Gastroenterology) Lavera Guise, [facility] as Pharmacist (Family Medicine) Jennye Boroughs, MD as Consulting Physician (Physical Medicine and Rehabilitation)  Indicate any recent Medical Services you may have received from other than [facility] providers in the past year (date may be approximate).     Assessment:   This is a routine wellness examination for Alex [name].  Hearing/Vision screen No results found.  Dietary issues and exercise activities discussed:     Goals Addressed   None    Depression Screen    06/20/2022    8:14 AM 06/18/2022   11:15 AM 05/10/2022   10:04 AM 03/19/2022    8:19 AM 02/13/2022   10:28 AM 12/18/2021   10:12 AM 12/08/2021    8:09 AM  PHQ 2/9 Scores  PHQ - 2 Score 1 0 6 3 0 2 1  PHQ- 9 Score    7   10    Fall Risk    06/20/2022    8:14 AM 06/18/2022   11:15 AM 05/10/2022   10:04 AM 03/19/2022    8:19 AM 02/13/2022   10:28 AM  Fall Risk   Falls in the past year? 0 0 0 0 0    FALL RISK PREVENTION PERTAINING TO THE HOME:  Any stairs in or around the home?  {YES/NO:21197} If so, are there any without handrails? {YES/NO:21197} Home free of loose throw rugs in walkways, pet beds, electrical cords, etc? {YES/NO:21197} Adequate lighting in your home to reduce risk of falls? {YES/NO:21197}  ASSISTIVE DEVICES UTILIZED TO PREVENT FALLS:  Life alert? {YES/NO:21197} Use of a cane, walker or w/c? {YES/NO:21197} Grab bars in the bathroom? {YES/NO:21197} Shower chair or bench in shower? {YES/NO:21197} Elevated toilet seat or a handicapped toilet? {YES/NO:21197}  TIMED UP AND GO:  Was the test performed? No . Telephonic visit    Cognitive Function:        07/14/2021    8:42 AM  6CIT Screen  What Year? 0 points  What month? 0 points  What time? 0 points  Count back from 20 0 points  Months in reverse 0 points  Repeat phrase 0 points  Total Score 0 points    Immunizations Immunization History  Administered Date(s) Administered   Influenza,inj,Quad PF,6+ Mos 05/28/2018, 03/30/2019, 04/06/2020, 03/24/2021, 05/23/2022   Influenza-Unspecified 05/21/2017   Janssen (J&J) SARS-COV-2 Vaccination 11/09/2019   Moderna Sars-Covid-2 Vaccination 07/19/2020   Pneumococcal Polysaccharide-23 02/21/2018   Tdap 05/24/2021    TDAP status: Up to date  Flu Vaccine status: Up to date  Pneumococcal vaccine status: Up to date  Covid-19 vaccine status: Information provided on how to obtain vaccines.   Qualifies for Shingles Vaccine? Yes   Zostavax completed No   Shingrix Completed?: No.    Education has been provided regarding the importance of this vaccine. Patient has been advised to call insurance company to determine out  of pocket expense if they have not yet received this vaccine. Advised may also receive vaccine at local pharmacy or Health Dept. Verbalized acceptance and understanding.  Screening Tests Health Maintenance  Topic Date Due   COVID-19 Vaccine (3 - 2023-24 season) 03/09/2022   Medicare Annual Wellness (AWV)  07/14/2022   Zoster  Vaccines- Shingrix (1 of 2) 03/20/2023 (Originally 12/27/1979)   COLONOSCOPY (Pts 45-67yr Insurance coverage will need to be confirmed)  06/21/2023 (Originally 12/26/2005)   FOOT EXAM  09/01/2022   HEMOGLOBIN A1C  12/20/2022   OPHTHALMOLOGY EXAM  04/20/2023   Diabetic kidney evaluation - eGFR measurement  06/21/2023   Diabetic kidney evaluation - Urine ACR  06/21/2023   DTaP/Tdap/Td (2 - Td or Tdap) 05/25/2031   INFLUENZA VACCINE  Completed   HIV Screening  Completed   HPV VACCINES  Aged Out   Hepatitis C Screening  Discontinued    Health Maintenance  Health Maintenance Due  Topic Date Due   COVID-19 Vaccine (3 - 2023-24 season) 03/09/2022   Medicare Annual Wellness (AWV)  07/14/2022    {Colorectal cancer screening:2101809}  Lung Cancer Screening: (Low Dose CT Chest recommended if Age 62-80years, 30 pack-year currently smoking OR have quit w/in 15years.) does not qualify.   Lung Cancer Screening Referral: n/a  Additional Screening:  Hepatitis C Screening: does qualify; Completed 09/05/21  Vision Screening: Recommended annual ophthalmology exams for early detection of glaucoma and other disorders of the eye. Is the patient up to date with their annual eye exam?  {YES/NO:21197} Who is the provider or what is the name of the office in which the patient attends annual eye exams? *** If pt is not established with a provider, would they like to be referred to a provider to establish care? {YES/NO:21197}.   Dental Screening: Recommended annual dental exams for proper oral hygiene  Community Resource Referral / Chronic Care Management: CRR required this visit?  {YES/NO:21197}  CCM required this visit?  {YES/NO:21197}     Plan:     I have personally reviewed and noted the following in the patient's chart:   Medical and social history Use of alcohol, tobacco or illicit drugs  Current medications and supplements including opioid prescriptions. {Opioid  [phone]} Functional ability and status Nutritional status Physical activity Advanced directives List of other physicians Hospitalizations, surgeries, and ER visits in previous 12 months Vitals Screenings to include cognitive, depression, and falls Referrals and appointments  In addition, I have reviewed and discussed with patient certain preventive protocols, quality metrics, and best practice recommendations. A written personalized care plan for preventive services as well as general preventive health recommendations were provided to patient.     SDenman GeorgeBWinona [place]  [id number]  Nurse Notes: ***

## 2022-08-15 ENCOUNTER — Telehealth: Payer: Self-pay | Admitting: Family Medicine

## 2022-08-15 ENCOUNTER — Ambulatory Visit (INDEPENDENT_AMBULATORY_CARE_PROVIDER_SITE_OTHER): Payer: Medicare HMO

## 2022-08-15 VITALS — Ht 66.0 in | Wt 214.0 lb

## 2022-08-15 DIAGNOSIS — Z0000 Encounter for general adult medical examination without abnormal findings: Secondary | ICD-10-CM

## 2022-08-16 ENCOUNTER — Telehealth: Payer: Self-pay | Admitting: Pharmacist

## 2022-08-16 ENCOUNTER — Telehealth: Payer: Self-pay | Admitting: Family Medicine

## 2022-08-16 DIAGNOSIS — E669 Obesity, unspecified: Secondary | ICD-10-CM

## 2022-08-16 MED ORDER — SEMAGLUTIDE (2 MG/DOSE) 8 MG/3ML ~~LOC~~ SOPN: 2.0000 mg | PEN_INJECTOR | SUBCUTANEOUS | 5 refills | Status: DC

## 2022-08-16 NOTE — Telephone Encounter (Signed)
Patient has been approved for Ozempic patient assistnace No samples available Will send RX to pharmacy Patient to get at pharmacy or await PAP supply to be delivered to PCP office Appreciate CPhT assistance

## 2022-08-16 NOTE — Telephone Encounter (Signed)
Patient needs copies of his financial information. Wants to talk to [place] directly about it. Please call back.

## 2022-08-16 NOTE — Telephone Encounter (Signed)
Received notification from [facility] [employer] regarding approval for [place]. Patient assistance approved until 07/09/23.  Medication will ship to office  Phone: [phone]  Patient informed medication should be shipped to the office in the next few weeks. Says he has 2 weeks of shots left.

## 2022-08-20 DIAGNOSIS — F1120 Opioid dependence, uncomplicated: Secondary | ICD-10-CM | POA: Diagnosis not present

## 2022-08-20 DIAGNOSIS — Z6835 Body mass index (BMI) 35.0-35.9, adult: Secondary | ICD-10-CM | POA: Diagnosis not present

## 2022-08-20 DIAGNOSIS — M542 Cervicalgia: Secondary | ICD-10-CM | POA: Diagnosis not present

## 2022-08-20 DIAGNOSIS — G894 Chronic pain syndrome: Secondary | ICD-10-CM | POA: Diagnosis not present

## 2022-08-20 DIAGNOSIS — G8929 Other chronic pain: Secondary | ICD-10-CM | POA: Diagnosis not present

## 2022-08-20 DIAGNOSIS — T402X5A Adverse effect of other opioids, initial encounter: Secondary | ICD-10-CM | POA: Diagnosis not present

## 2022-08-20 DIAGNOSIS — M25561 Pain in right knee: Secondary | ICD-10-CM | POA: Diagnosis not present

## 2022-08-20 DIAGNOSIS — Z79899 Other long term (current) drug therapy: Secondary | ICD-10-CM | POA: Diagnosis not present

## 2022-08-20 DIAGNOSIS — K5903 Drug induced constipation: Secondary | ICD-10-CM | POA: Diagnosis not present

## 2022-08-21 ENCOUNTER — Telehealth: Payer: Self-pay

## 2022-08-21 NOTE — Telephone Encounter (Signed)
Patient aware that Ozempic has been received and placed in the refrigerator for pick up.

## 2022-08-24 DIAGNOSIS — M47819 Spondylosis without myelopathy or radiculopathy, site unspecified: Secondary | ICD-10-CM | POA: Diagnosis not present

## 2022-09-03 DIAGNOSIS — M47819 Spondylosis without myelopathy or radiculopathy, site unspecified: Secondary | ICD-10-CM | POA: Diagnosis not present

## 2022-09-13 ENCOUNTER — Encounter: Payer: Self-pay | Admitting: Radiology

## 2022-09-19 ENCOUNTER — Encounter: Payer: Self-pay | Admitting: Family Medicine

## 2022-09-19 ENCOUNTER — Ambulatory Visit (INDEPENDENT_AMBULATORY_CARE_PROVIDER_SITE_OTHER): Payer: Medicare HMO | Admitting: Family Medicine

## 2022-09-19 VITALS — BP 148/81 | HR 71 | Temp 98.0°F | Ht 66.0 in | Wt 223.0 lb

## 2022-09-19 DIAGNOSIS — E119 Type 2 diabetes mellitus without complications: Secondary | ICD-10-CM | POA: Diagnosis not present

## 2022-09-19 DIAGNOSIS — E785 Hyperlipidemia, unspecified: Secondary | ICD-10-CM

## 2022-09-19 DIAGNOSIS — M549 Dorsalgia, unspecified: Secondary | ICD-10-CM | POA: Diagnosis not present

## 2022-09-19 DIAGNOSIS — E669 Obesity, unspecified: Secondary | ICD-10-CM | POA: Diagnosis not present

## 2022-09-19 DIAGNOSIS — E1159 Type 2 diabetes mellitus with other circulatory complications: Secondary | ICD-10-CM

## 2022-09-19 DIAGNOSIS — I152 Hypertension secondary to endocrine disorders: Secondary | ICD-10-CM

## 2022-09-19 DIAGNOSIS — Z79899 Other long term (current) drug therapy: Secondary | ICD-10-CM | POA: Diagnosis not present

## 2022-09-19 DIAGNOSIS — E1169 Type 2 diabetes mellitus with other specified complication: Secondary | ICD-10-CM

## 2022-09-19 DIAGNOSIS — E559 Vitamin D deficiency, unspecified: Secondary | ICD-10-CM | POA: Diagnosis not present

## 2022-09-19 DIAGNOSIS — M47819 Spondylosis without myelopathy or radiculopathy, site unspecified: Secondary | ICD-10-CM | POA: Diagnosis not present

## 2022-09-19 DIAGNOSIS — Z0130 Encounter for examination of blood pressure without abnormal findings: Secondary | ICD-10-CM | POA: Diagnosis not present

## 2022-09-19 DIAGNOSIS — I10 Essential (primary) hypertension: Secondary | ICD-10-CM | POA: Diagnosis not present

## 2022-09-19 DIAGNOSIS — M792 Neuralgia and neuritis, unspecified: Secondary | ICD-10-CM | POA: Diagnosis not present

## 2022-09-19 DIAGNOSIS — Z6835 Body mass index (BMI) 35.0-35.9, adult: Secondary | ICD-10-CM

## 2022-09-19 DIAGNOSIS — M62830 Muscle spasm of back: Secondary | ICD-10-CM | POA: Diagnosis not present

## 2022-09-19 DIAGNOSIS — Z6836 Body mass index (BMI) 36.0-36.9, adult: Secondary | ICD-10-CM | POA: Diagnosis not present

## 2022-09-19 LAB — BAYER DCA HB A1C WAIVED: HB A1C (BAYER DCA - WAIVED): 5.6 % (ref 4.8–5.6)

## 2022-09-19 MED ORDER — ROSUVASTATIN CALCIUM 10 MG PO TABS: 10.0000 mg | ORAL_TABLET | Freq: Every day | ORAL | 3 refills | Status: DC

## 2022-09-19 MED ORDER — PANTOPRAZOLE SODIUM 40 MG PO TBEC: 40.0000 mg | DELAYED_RELEASE_TABLET | Freq: Two times a day (BID) | ORAL | 3 refills | Status: DC

## 2022-09-19 MED ORDER — LISINOPRIL 40 MG PO TABS: 40.0000 mg | ORAL_TABLET | Freq: Every day | ORAL | 3 refills | Status: DC

## 2022-09-19 MED ORDER — SEMAGLUTIDE (2 MG/DOSE) 8 MG/3ML ~~LOC~~ SOPN: 2.0000 mg | PEN_INJECTOR | SUBCUTANEOUS | 5 refills | Status: DC

## 2022-09-19 NOTE — Progress Notes (Signed)
BP (!) 148/81   Pulse 71   Temp 98 F (36.7 C)   Ht '5\' 6"'$  (1.676 m)   Wt 223 lb (101.2 kg)   SpO2 96%   BMI 35.99 kg/m    Subjective:   Patient ID: Alex [name], male    DOB: 03-19-1961, 62 y.o.   MRN: [id number]  HPI: Alex [name] is a 62 y.o. male presenting on 09/19/2022 for Medical Management of Chronic Issues, Diabetes, and Obesity   HPI Type 2 diabetes mellitus Patient comes in today for recheck of his diabetes. Patient has been currently taking Ozempic. Patient is currently on an ACE inhibitor/ARB. Patient has not seen an ophthalmologist this year. Patient denies any issues with their feet. The symptom started onset as an adult hypertension and hyperlipidemia ARE RELATED TO DM   Hypertension Patient is currently on amlodipine and lisinopril, and their blood pressure today is 131/69. Patient denies any lightheadedness or dizziness. Patient denies headaches, blurred vision, chest pains, shortness of breath, or weakness. Denies any side effects from medication and is content with current medication.   Hyperlipidemia Patient is coming in for recheck of his hyperlipidemia. The patient is currently taking Crestor. They deny any issues with myalgias or history of liver damage from it. They deny any focal numbness or weakness or chest pain.   Relevant past medical, surgical, family and social history reviewed and updated as indicated. Interim medical history since our last visit reviewed. Allergies and medications reviewed and updated.  Review of Systems  Constitutional:  Negative for chills and fever.  Eyes:  Negative for visual disturbance.  Respiratory:  Negative for shortness of breath and wheezing.   Cardiovascular:  Negative for chest pain and leg swelling.  Musculoskeletal:  Negative for back pain and gait problem.  Skin:  Negative for rash.  Neurological:  Negative for dizziness, weakness and numbness.  All other systems reviewed and are negative.   Per HPI unless  specifically indicated above   Allergies as of 09/19/2022       Reactions   Morphine And Related Nausea Only   Buprenorphine Hcl-naloxone Hcl Nausea Only   Codeine Nausea Only, Other (See Comments)   per pt, "felt weird in my head"        Medication List        Accurate as of September 19, 2022 10:34 AM. If you have any questions, ask your nurse or doctor.          STOP taking these medications    cyclobenzaprine 10 MG tablet Commonly known as: FLEXERIL Stopped by: Fransisca Kaufmann Torien Ramroop, MD   tiZANidine 4 MG tablet Commonly known as: Zanaflex Stopped by: Fransisca Kaufmann Nialah Saravia, MD       TAKE these medications    amLODipine 5 MG tablet Commonly known as: NORVASC Take 2 tablets (10 mg total) by mouth daily.   calcium-vitamin D 500-5 MG-MCG tablet Commonly known as: OSCAL WITH D Take 1 tablet by mouth daily with breakfast.   diazepam 5 MG tablet Commonly known as: VALIUM Take 1 tablet (5 mg total) by mouth daily as needed for anxiety.   diclofenac 75 MG EC tablet Commonly known as: VOLTAREN Take 1 tablet (75 mg total) by mouth 2 (two) times daily.   diclofenac Sodium 1 % Gel Commonly known as: Voltaren Apply 2 g topically 4 (four) times daily.   dicyclomine 10 MG capsule Commonly known as: Bentyl Take 1 capsule (10 mg total) by mouth 4 (four) times daily -  before meals and at bedtime.   fluticasone 50 MCG/ACT nasal spray Commonly known as: FLONASE USE 1 SPRAY IN EACH NOSTRIL 2 TIMES A DAY AS NEEDED FOR ALLERGIES   HYDROcodone-acetaminophen 10-325 MG tablet Commonly known as: NORCO Take 1 tablet by mouth daily. Take 4 tablets per day for one week   #28 Take 3 tablets per day for one week   #21 Take 2 tablets per day for one week   #14 Take 1 tablet per day for one week      #7  then stop Total #70 tablets   ibuprofen 800 MG tablet Commonly known as: ADVIL Take 800 mg by mouth 3 (three) times daily.   ketoconazole 2 % shampoo Commonly known as:  NIZORAL LEAVE in FOR 10 minutes, THEN RINSE twice PER WEEK FOR UP TO FOUR WEEKS   lisinopril 40 MG tablet Commonly known as: ZESTRIL Take 1 tablet (40 mg total) by mouth daily.   nabumetone 750 MG tablet Commonly known as: RELAFEN Take 1 tablet (750 mg total) by mouth 2 (two) times daily as needed.   pantoprazole 40 MG tablet Commonly known as: PROTONIX Take 1 tablet (40 mg total) by mouth 2 (two) times daily before a meal.   rosuvastatin 10 MG tablet Commonly known as: CRESTOR Take 1 tablet (10 mg total) by mouth daily.   Semaglutide (2 MG/DOSE) 8 MG/3ML Sopn Inject 2 mg as directed once a week.   venlafaxine XR 37.5 MG 24 hr capsule Commonly known as: Effexor XR Take 1 capsule (37.5 mg total) by mouth daily.         Objective:   BP (!) 148/81   Pulse 71   Temp 98 F (36.7 C)   Ht '5\' 6"'$  (1.676 m)   Wt 223 lb (101.2 kg)   SpO2 96%   BMI 35.99 kg/m   Wt Readings from Last 3 Encounters:  09/19/22 223 lb (101.2 kg)  08/15/22 214 lb (97.1 kg)  06/20/22 214 lb (97.1 kg)    Physical Exam Vitals and nursing note reviewed.  Constitutional:      General: He is not in acute distress.    Appearance: He is well-developed. He is not diaphoretic.  Eyes:     General: No scleral icterus.    Conjunctiva/sclera: Conjunctivae normal.  Neck:     Thyroid: No thyromegaly.  Cardiovascular:     Rate and Rhythm: Normal rate and regular rhythm.     Heart sounds: Normal heart sounds. No murmur heard. Pulmonary:     Effort: Pulmonary effort is normal. No respiratory distress.     Breath sounds: Normal breath sounds. No wheezing.  Musculoskeletal:        General: No swelling. Normal range of motion.     Cervical back: Neck supple.  Lymphadenopathy:     Cervical: No cervical adenopathy.  Skin:    General: Skin is warm and dry.     Findings: No rash.  Neurological:     Mental Status: He is alert and oriented to person, place, and time.     Coordination: Coordination normal.   Psychiatric:        Behavior: Behavior normal.       Assessment & Plan:   Problem List Items Addressed This Visit       Cardiovascular and Mediastinum   Hypertension associated with diabetes ([place])   Relevant Medications   lisinopril (ZESTRIL) 40 MG tablet   rosuvastatin (CRESTOR) 10 MG tablet   Semaglutide, 2 MG/DOSE,  8 MG/3ML SOPN     Endocrine   Diabetes mellitus type 2 in obese (H[facility]) - Primary   Relevant Medications   lisinopril (ZESTRIL) 40 MG tablet   rosuvastatin (CRESTOR) 10 MG tablet   Semaglutide, 2 MG/DOSE, 8 MG/3ML SOPN   Other Relevant Orders   Bayer DCA Hb A1c Waived   Hyperlipidemia associated with type 2 diabetes mellitus ([facility]CC)   Relevant Medications   lisinopril (ZESTRIL) 40 MG tablet   rosuvastatin (CRESTOR) 10 MG tablet   Semaglutide, 2 MG/DOSE, 8 MG/3ML SOPN     Other   Morbid obesity ([facility]CC)   Relevant Medications   Semaglutide, 2 MG/DOSE, 8 MG/3ML SOPN  A1c looks good at 5.6.  No changes.  Continue current medicine  Follow up plan: Return in about 3 months (around 12/20/2022), or if symptoms worsen or fail to improve, for Diabetes recheck.  Counseling provided for all of the vaccine components Orders Placed This Encounter  Procedures   Bayer Mangham Hb A1c [place] Ilah Boule, MD [facility] 09/19/2022, 10:34 AM

## 2022-09-22 DIAGNOSIS — M47819 Spondylosis without myelopathy or radiculopathy, site unspecified: Secondary | ICD-10-CM | POA: Diagnosis not present

## 2022-10-02 DIAGNOSIS — M47819 Spondylosis without myelopathy or radiculopathy, site unspecified: Secondary | ICD-10-CM | POA: Diagnosis not present

## 2022-10-19 DIAGNOSIS — G8929 Other chronic pain: Secondary | ICD-10-CM | POA: Diagnosis not present

## 2022-10-19 DIAGNOSIS — I10 Essential (primary) hypertension: Secondary | ICD-10-CM | POA: Diagnosis not present

## 2022-10-19 DIAGNOSIS — M47819 Spondylosis without myelopathy or radiculopathy, site unspecified: Secondary | ICD-10-CM | POA: Diagnosis not present

## 2022-10-19 DIAGNOSIS — Z0130 Encounter for examination of blood pressure without abnormal findings: Secondary | ICD-10-CM | POA: Diagnosis not present

## 2022-10-19 DIAGNOSIS — M792 Neuralgia and neuritis, unspecified: Secondary | ICD-10-CM | POA: Diagnosis not present

## 2022-10-19 DIAGNOSIS — E559 Vitamin D deficiency, unspecified: Secondary | ICD-10-CM | POA: Diagnosis not present

## 2022-10-19 DIAGNOSIS — M549 Dorsalgia, unspecified: Secondary | ICD-10-CM | POA: Diagnosis not present

## 2022-10-19 DIAGNOSIS — Z6835 Body mass index (BMI) 35.0-35.9, adult: Secondary | ICD-10-CM | POA: Diagnosis not present

## 2022-10-19 DIAGNOSIS — E119 Type 2 diabetes mellitus without complications: Secondary | ICD-10-CM | POA: Diagnosis not present

## 2022-10-19 DIAGNOSIS — Z79899 Other long term (current) drug therapy: Secondary | ICD-10-CM | POA: Diagnosis not present

## 2022-10-22 ENCOUNTER — Other Ambulatory Visit: Payer: Self-pay | Admitting: Family Medicine

## 2022-10-22 DIAGNOSIS — L219 Seborrheic dermatitis, unspecified: Secondary | ICD-10-CM

## 2022-10-23 DIAGNOSIS — M47819 Spondylosis without myelopathy or radiculopathy, site unspecified: Secondary | ICD-10-CM | POA: Diagnosis not present

## 2022-10-25 DIAGNOSIS — Z79899 Other long term (current) drug therapy: Secondary | ICD-10-CM | POA: Diagnosis not present

## 2022-11-02 DIAGNOSIS — M47819 Spondylosis without myelopathy or radiculopathy, site unspecified: Secondary | ICD-10-CM | POA: Diagnosis not present

## 2022-11-19 DIAGNOSIS — M792 Neuralgia and neuritis, unspecified: Secondary | ICD-10-CM | POA: Diagnosis not present

## 2022-11-19 DIAGNOSIS — Z79899 Other long term (current) drug therapy: Secondary | ICD-10-CM | POA: Diagnosis not present

## 2022-11-19 DIAGNOSIS — M549 Dorsalgia, unspecified: Secondary | ICD-10-CM | POA: Diagnosis not present

## 2022-11-19 DIAGNOSIS — S39012A Strain of muscle, fascia and tendon of lower back, initial encounter: Secondary | ICD-10-CM | POA: Diagnosis not present

## 2022-11-19 DIAGNOSIS — R5383 Other fatigue: Secondary | ICD-10-CM | POA: Diagnosis not present

## 2022-11-19 DIAGNOSIS — I10 Essential (primary) hypertension: Secondary | ICD-10-CM | POA: Diagnosis not present

## 2022-11-19 DIAGNOSIS — E538 Deficiency of other specified B group vitamins: Secondary | ICD-10-CM | POA: Diagnosis not present

## 2022-11-19 DIAGNOSIS — E559 Vitamin D deficiency, unspecified: Secondary | ICD-10-CM | POA: Diagnosis not present

## 2022-11-19 DIAGNOSIS — E119 Type 2 diabetes mellitus without complications: Secondary | ICD-10-CM | POA: Diagnosis not present

## 2022-11-19 DIAGNOSIS — M47819 Spondylosis without myelopathy or radiculopathy, site unspecified: Secondary | ICD-10-CM | POA: Diagnosis not present

## 2022-11-19 DIAGNOSIS — M129 Arthropathy, unspecified: Secondary | ICD-10-CM | POA: Diagnosis not present

## 2022-11-22 ENCOUNTER — Telehealth: Payer: Self-pay | Admitting: Pharmacist

## 2022-11-22 DIAGNOSIS — M47819 Spondylosis without myelopathy or radiculopathy, site unspecified: Secondary | ICD-10-CM | POA: Diagnosis not present

## 2022-11-22 NOTE — Telephone Encounter (Signed)
Patient informed of Ozempic patient assistance supply #4 boxes of 2mg  weekly Ozempic in lab fridge for pick up.  His dose remains at 2mg  weekly Denies personal and family history of Medullary thyroid cancer (MTC) Spoke with patient and he states blood sugar and blood pressure are controlled.   His A1c remains at goal with the help of medication assistance.      Alex [name], PharmD, BCACP Clinical Pharmacist, [facility]

## 2022-11-26 DIAGNOSIS — Z79899 Other long term (current) drug therapy: Secondary | ICD-10-CM | POA: Diagnosis not present

## 2022-11-29 ENCOUNTER — Telehealth: Payer: Self-pay | Admitting: Family Medicine

## 2022-11-29 DIAGNOSIS — M4722 Other spondylosis with radiculopathy, cervical region: Secondary | ICD-10-CM

## 2022-11-29 DIAGNOSIS — T84032D Mechanical loosening of internal right knee prosthetic joint, subsequent encounter: Secondary | ICD-10-CM

## 2022-11-29 NOTE — Telephone Encounter (Signed)
REFERRAL REQUEST Telephone Note  Have you been seen at our office for this problem? Pain clinic (Advise that they may need an appointment with their PCP before a referral can be done)  Reason for Referral: what was to switch to new pain clinic Referral discussed with patient: yes  Best contact number of patient for referral team: [phone]    Has patient been seen by a specialist for this issue before: yes  Patient provider preference for referral: needs new referral to pain clinic. Pt wants to go to [facility] in [place] fax [phone] phone [phone] Patient location preference for referral: Pt wants to go to [facility] in [place] fax [phone] phone [phone]   Patient notified that referrals can take up to a week or longer to process. If they haven't heard anything within a week they should call back and speak with the referral department.

## 2022-12-02 DIAGNOSIS — M47819 Spondylosis without myelopathy or radiculopathy, site unspecified: Secondary | ICD-10-CM | POA: Diagnosis not present

## 2022-12-18 ENCOUNTER — Telehealth: Payer: Self-pay | Admitting: Family Medicine

## 2022-12-18 NOTE — Telephone Encounter (Signed)
Please review

## 2022-12-18 NOTE — Telephone Encounter (Signed)
Pt called to cancel his appointment with Dr Dettinger on 6/13 and reschedule for another day. I offered him the next available with Dr Louanne Skye, which was in August. Pt got upset and rudely asked "Do you not have any appointments in June?" And I explained to him that he waited until 2 days before his appt to call and reschedule it (he does this often) so right now has his current appt on 6/13 but if he cancels it then no he wont be able to be seen in June because August is Dr Dettingers next available. Pt insisted that we were punishing him for waiting until the last minute to change his appt and I explained that that's not what I meant and that its because Dr Dettinger books up so quickly, you have to call ahead. Pt said this was "bullshit" and that he might be finding himself another doctor if he can't get in to see him before August and to add him to cancellation list.

## 2022-12-19 NOTE — Telephone Encounter (Signed)
I agree to adding to the cancellation list, if there are things that come up to cancel that yes unfortunately I am booked out.

## 2022-12-19 NOTE — Telephone Encounter (Signed)
Dettinger has cancellation on 6/24. Pt scheduled at 8:10 that day. Pt is aware.

## 2022-12-20 ENCOUNTER — Ambulatory Visit: Payer: Medicare HMO | Admitting: Family Medicine

## 2022-12-20 DIAGNOSIS — M549 Dorsalgia, unspecified: Secondary | ICD-10-CM | POA: Diagnosis not present

## 2022-12-20 DIAGNOSIS — R030 Elevated blood-pressure reading, without diagnosis of hypertension: Secondary | ICD-10-CM | POA: Diagnosis not present

## 2022-12-20 DIAGNOSIS — M47819 Spondylosis without myelopathy or radiculopathy, site unspecified: Secondary | ICD-10-CM | POA: Diagnosis not present

## 2022-12-20 DIAGNOSIS — Z6835 Body mass index (BMI) 35.0-35.9, adult: Secondary | ICD-10-CM | POA: Diagnosis not present

## 2022-12-20 DIAGNOSIS — S39012A Strain of muscle, fascia and tendon of lower back, initial encounter: Secondary | ICD-10-CM | POA: Diagnosis not present

## 2022-12-20 DIAGNOSIS — E119 Type 2 diabetes mellitus without complications: Secondary | ICD-10-CM | POA: Diagnosis not present

## 2022-12-20 DIAGNOSIS — G8929 Other chronic pain: Secondary | ICD-10-CM | POA: Diagnosis not present

## 2022-12-20 DIAGNOSIS — M792 Neuralgia and neuritis, unspecified: Secondary | ICD-10-CM | POA: Diagnosis not present

## 2022-12-20 DIAGNOSIS — Z79899 Other long term (current) drug therapy: Secondary | ICD-10-CM | POA: Diagnosis not present

## 2022-12-20 DIAGNOSIS — I10 Essential (primary) hypertension: Secondary | ICD-10-CM | POA: Diagnosis not present

## 2022-12-23 DIAGNOSIS — M47819 Spondylosis without myelopathy or radiculopathy, site unspecified: Secondary | ICD-10-CM | POA: Diagnosis not present

## 2022-12-26 DIAGNOSIS — M47812 Spondylosis without myelopathy or radiculopathy, cervical region: Secondary | ICD-10-CM | POA: Diagnosis not present

## 2022-12-26 DIAGNOSIS — M4316 Spondylolisthesis, lumbar region: Secondary | ICD-10-CM | POA: Diagnosis not present

## 2022-12-26 DIAGNOSIS — M50322 Other cervical disc degeneration at C5-C6 level: Secondary | ICD-10-CM | POA: Diagnosis not present

## 2022-12-26 DIAGNOSIS — M47817 Spondylosis without myelopathy or radiculopathy, lumbosacral region: Secondary | ICD-10-CM | POA: Diagnosis not present

## 2022-12-26 DIAGNOSIS — M25561 Pain in right knee: Secondary | ICD-10-CM | POA: Diagnosis not present

## 2022-12-26 DIAGNOSIS — M47816 Spondylosis without myelopathy or radiculopathy, lumbar region: Secondary | ICD-10-CM | POA: Diagnosis not present

## 2022-12-26 DIAGNOSIS — Z981 Arthrodesis status: Secondary | ICD-10-CM | POA: Diagnosis not present

## 2022-12-26 DIAGNOSIS — M542 Cervicalgia: Secondary | ICD-10-CM | POA: Diagnosis not present

## 2022-12-26 DIAGNOSIS — Z79899 Other long term (current) drug therapy: Secondary | ICD-10-CM | POA: Diagnosis not present

## 2022-12-26 DIAGNOSIS — M4856XA Collapsed vertebra, not elsewhere classified, lumbar region, initial encounter for fracture: Secondary | ICD-10-CM | POA: Diagnosis not present

## 2022-12-26 DIAGNOSIS — Z5181 Encounter for therapeutic drug level monitoring: Secondary | ICD-10-CM | POA: Diagnosis not present

## 2022-12-26 DIAGNOSIS — Z96651 Presence of right artificial knee joint: Secondary | ICD-10-CM | POA: Diagnosis not present

## 2022-12-26 DIAGNOSIS — M545 Low back pain, unspecified: Secondary | ICD-10-CM | POA: Diagnosis not present

## 2022-12-26 DIAGNOSIS — M4854XA Collapsed vertebra, not elsewhere classified, thoracic region, initial encounter for fracture: Secondary | ICD-10-CM | POA: Diagnosis not present

## 2022-12-26 DIAGNOSIS — M5136 Other intervertebral disc degeneration, lumbar region: Secondary | ICD-10-CM | POA: Diagnosis not present

## 2022-12-31 ENCOUNTER — Encounter: Payer: Self-pay | Admitting: Family Medicine

## 2022-12-31 ENCOUNTER — Ambulatory Visit (INDEPENDENT_AMBULATORY_CARE_PROVIDER_SITE_OTHER): Payer: Medicare HMO | Admitting: Family Medicine

## 2022-12-31 VITALS — BP 123/71 | HR 66 | Ht 66.0 in | Wt 215.0 lb

## 2022-12-31 DIAGNOSIS — Z6834 Body mass index (BMI) 34.0-34.9, adult: Secondary | ICD-10-CM

## 2022-12-31 DIAGNOSIS — E1142 Type 2 diabetes mellitus with diabetic polyneuropathy: Secondary | ICD-10-CM | POA: Diagnosis not present

## 2022-12-31 DIAGNOSIS — E1159 Type 2 diabetes mellitus with other circulatory complications: Secondary | ICD-10-CM | POA: Diagnosis not present

## 2022-12-31 DIAGNOSIS — M4722 Other spondylosis with radiculopathy, cervical region: Secondary | ICD-10-CM | POA: Diagnosis not present

## 2022-12-31 DIAGNOSIS — M792 Neuralgia and neuritis, unspecified: Secondary | ICD-10-CM

## 2022-12-31 DIAGNOSIS — E785 Hyperlipidemia, unspecified: Secondary | ICD-10-CM | POA: Diagnosis not present

## 2022-12-31 DIAGNOSIS — E1169 Type 2 diabetes mellitus with other specified complication: Secondary | ICD-10-CM | POA: Diagnosis not present

## 2022-12-31 DIAGNOSIS — E669 Obesity, unspecified: Secondary | ICD-10-CM

## 2022-12-31 DIAGNOSIS — I152 Hypertension secondary to endocrine disorders: Secondary | ICD-10-CM | POA: Diagnosis not present

## 2022-12-31 LAB — BAYER DCA HB A1C WAIVED: HB A1C (BAYER DCA - WAIVED): 5.4 % (ref 4.8–5.6)

## 2022-12-31 MED ORDER — DICLOFENAC SODIUM 75 MG PO TBEC: 75.0000 mg | DELAYED_RELEASE_TABLET | Freq: Two times a day (BID) | ORAL | 3 refills | Status: DC

## 2022-12-31 MED ORDER — DICLOFENAC SODIUM 1 % EX GEL
2.0000 g | Freq: Four times a day (QID) | CUTANEOUS | 1 refills | Status: DC
Start: 2022-12-31 — End: 2024-04-16

## 2022-12-31 MED ORDER — AMLODIPINE BESYLATE 5 MG PO TABS
10.0000 mg | ORAL_TABLET | Freq: Every day | ORAL | 3 refills | Status: DC
Start: 2022-12-31 — End: 2023-02-21

## 2022-12-31 NOTE — Progress Notes (Signed)
BP 123/71   Pulse 66   Ht 5\' 6"  (1.676 m)   Wt 215 lb (97.5 kg)   SpO2 97%   BMI 34.70 kg/m    Subjective:   Patient ID: Alex [name], male    DOB: 10-30-1960, 62 y.o.   MRN: [id number]  HPI: Alex [name] is a 62 y.o. male presenting on 12/31/2022 for Medical Management of Chronic Issues, Hypertension, Hyperlipidemia, and Diabetes   HPI Type 2 diabetes mellitus Patient comes in today for recheck of his diabetes. Patient has been currently taking [place] and Ozempic. Patient is currently on an ACE inhibitor/ARB. Patient has not seen an ophthalmologist this year. Patient denies any new issues with their feet. The symptom started onset as an adult hypertension and hyperlipidemia ARE RELATED TO DM   Hypertension Patient is currently on lisinopril and amlodipine, and their blood pressure today is 123/71. Patient denies any lightheadedness or dizziness. Patient denies headaches, blurred vision, chest pains, shortness of breath, or weakness. Denies any side effects from medication and is content with current medication.   Hyperlipidemia Patient is coming in for recheck of his hyperlipidemia. The patient is currently taking Crestor. They deny any issues with myalgias or history of liver damage from it. They deny any focal numbness or weakness or chest pain.   Patient is seeing a pain management doctor will start by doing a nerve block for the pain in his knee and he is considering going to try it.  Relevant past medical, surgical, family and social history reviewed and updated as indicated. Interim medical history since our last visit reviewed. Allergies and medications reviewed and updated.  Review of Systems  Constitutional:  Negative for chills and fever.  Eyes:  Negative for visual disturbance.  Respiratory:  Negative for shortness of breath and wheezing.   Cardiovascular:  Negative for chest pain and leg swelling.  Musculoskeletal:  Positive for arthralgias, back pain and  myalgias. Negative for gait problem.  Skin:  Negative for rash.  Neurological:  Negative for dizziness, weakness and light-headedness.  All other systems reviewed and are negative.   Per HPI unless specifically indicated above   Allergies as of 12/31/2022       Reactions   Morphine And Codeine Nausea Only   Buprenorphine Hcl-naloxone Hcl Nausea Only   Codeine Nausea Only, Other (See Comments)   per pt, "felt weird in my head"        Medication List        Accurate as of December 31, 2022  8:39 AM. If you have any questions, ask your nurse or doctor.          amLODipine 5 MG tablet Commonly known as: NORVASC Take 2 tablets (10 mg total) by mouth daily.   calcium-vitamin D 500-5 MG-MCG tablet Commonly known as: OSCAL WITH D Take 1 tablet by mouth daily with breakfast.   celecoxib 200 MG capsule Commonly known as: CELEBREX Take 1 capsule by mouth daily.   diazepam 5 MG tablet Commonly known as: VALIUM Take 1 tablet (5 mg total) by mouth daily as needed for anxiety.   diclofenac 75 MG EC tablet Commonly known as: VOLTAREN Take 1 tablet (75 mg total) by mouth 2 (two) times daily.   diclofenac Sodium 1 % Gel Commonly known as: Voltaren Apply 2 g topically 4 (four) times daily.   dicyclomine 10 MG capsule Commonly known as: Bentyl Take 1 capsule (10 mg total) by mouth 4 (four) times daily -  before meals  and at bedtime.   Farxiga 10 MG Tabs tablet Generic drug: dapagliflozin propanediol Take 10 mg by mouth daily.   fluticasone 50 MCG/ACT nasal spray Commonly known as: FLONASE USE 1 SPRAY IN EACH NOSTRIL 2 TIMES A DAY AS NEEDED FOR ALLERGIES   HYDROcodone-acetaminophen 10-325 MG tablet Commonly known as: NORCO Take 1 tablet by mouth daily. Take 4 tablets per day for one week   #28 Take 3 tablets per day for one week   #21 Take 2 tablets per day for one week   #14 Take 1 tablet per day for one week      #7  then stop Total #70 tablets   ibuprofen 800 MG  tablet Commonly known as: ADVIL Take 800 mg by mouth 3 (three) times daily.   ketoconazole 2 % shampoo Commonly known as: NIZORAL TWICE A WEEK FOR UP TO 4 WEEKS, SHAMPOO AS DIRECTED, LEAVE IN FOR 10 MINUTES, THEN RINSE.   lisinopril 40 MG tablet Commonly known as: ZESTRIL Take 1 tablet (40 mg total) by mouth daily.   nabumetone 750 MG tablet Commonly known as: RELAFEN Take 1 tablet (750 mg total) by mouth 2 (two) times daily as needed.   pantoprazole 40 MG tablet Commonly known as: PROTONIX Take 1 tablet (40 mg total) by mouth 2 (two) times daily before a meal.   rosuvastatin 10 MG tablet Commonly known as: CRESTOR Take 1 tablet (10 mg total) by mouth daily.   Semaglutide (2 MG/DOSE) 8 MG/3ML Sopn Inject 2 mg as directed once a week.   venlafaxine XR 37.5 MG 24 hr capsule Commonly known as: Effexor XR Take 1 capsule (37.5 mg total) by mouth daily.         Objective:   BP 123/71   Pulse 66   Ht 5\' 6"  (1.676 m)   Wt 215 lb (97.5 kg)   SpO2 97%   BMI 34.70 kg/m   Wt Readings from Last 3 Encounters:  12/31/22 215 lb (97.5 kg)  09/19/22 223 lb (101.2 kg)  08/15/22 214 lb (97.1 kg)    Physical Exam Vitals and nursing note reviewed.  Constitutional:      General: He is not in acute distress.    Appearance: He is well-developed. He is not diaphoretic.  Eyes:     General: No scleral icterus.    Conjunctiva/sclera: Conjunctivae normal.  Neck:     Thyroid: No thyromegaly.  Cardiovascular:     Rate and Rhythm: Normal rate and regular rhythm.     Heart sounds: Normal heart sounds. No murmur heard. Pulmonary:     Effort: Pulmonary effort is normal. No respiratory distress.     Breath sounds: Normal breath sounds. No wheezing.  Musculoskeletal:        General: No swelling. Normal range of motion.     Cervical back: Neck supple.  Lymphadenopathy:     Cervical: No cervical adenopathy.  Skin:    General: Skin is warm and dry.     Findings: No rash.   Neurological:     Mental Status: He is alert and oriented to person, place, and time.     Coordination: Coordination normal.  Psychiatric:        Behavior: Behavior normal.       Assessment & Plan:   Problem List Items Addressed This Visit       Cardiovascular and Mediastinum   Hypertension associated with diabetes (HCC) - Primary   Relevant Medications   FARXIGA 10 MG TABS tablet  amLODipine (NORVASC) 5 MG tablet   Other Relevant Orders   CBC with Differential/Platelet   CMP14+EGFR   Lipid panel   Bayer DCA Hb A1c Waived     Endocrine   Type 2 diabetes mellitus with obesity (HCC)   Relevant Medications   FARXIGA 10 MG TABS tablet   Hyperlipidemia associated with type 2 diabetes mellitus (HCC)   Relevant Medications   FARXIGA 10 MG TABS tablet   amLODipine (NORVASC) 5 MG tablet   Other Relevant Orders   CBC with Differential/Platelet   CMP14+EGFR   Lipid panel   Bayer DCA Hb A1c Waived     Musculoskeletal and Integument   Cervical spine degeneration   Relevant Medications   celecoxib (CELEBREX) 200 MG capsule   diclofenac (VOLTAREN) 75 MG EC tablet   diclofenac Sodium (VOLTAREN) 1 % GEL     Other   Neuropathic pain   Other Visit Diagnoses     Type 2 diabetes mellitus with diabetic polyneuropathy, without long-term current use of insulin (HCC)       Relevant Medications   FARXIGA 10 MG TABS tablet   Other Relevant Orders   CBC with Differential/Platelet   CMP14+EGFR   Lipid panel   Bayer DCA Hb A1c Waived       Continue current medicine, A1c looks good at 5.4.  Blood pressure looks good.  No changes Follow up plan: Return in about 3 months (around 04/02/2023), or if symptoms worsen or fail to improve, for Diabetes and hypertension and cholesterol.  Counseling provided for all of the vaccine components Orders Placed This Encounter  Procedures   CBC with Differential/Platelet   CMP14+EGFR   Lipid panel   Bayer DCA Hb A1c Waived    Arville Care, MD [facility] 12/31/2022, 8:39 AM

## 2023-01-01 LAB — CMP14+EGFR
ALT: 20 IU/L (ref 0–44)
AST: 17 IU/L (ref 0–40)
Albumin: 4.4 g/dL (ref 3.9–4.9)
Alkaline Phosphatase: 76 IU/L (ref 44–121)
BUN/Creatinine Ratio: 14 (ref 10–24)
BUN: 11 mg/dL (ref 8–27)
Bilirubin Total: 0.4 mg/dL (ref 0.0–1.2)
CO2: 20 mmol/L (ref 20–29)
Calcium: 9.2 mg/dL (ref 8.6–10.2)
Chloride: 103 mmol/L (ref 96–106)
Creatinine, Ser: 0.77 mg/dL (ref 0.76–1.27)
Globulin, Total: 2.1 g/dL (ref 1.5–4.5)
Glucose: 123 mg/dL — ABNORMAL HIGH (ref 70–99)
Potassium: 4 mmol/L (ref 3.5–5.2)
Sodium: 139 mmol/L (ref 134–144)
Total Protein: 6.5 g/dL (ref 6.0–8.5)
eGFR: 101 mL/min/{1.73_m2} (ref >59)

## 2023-01-01 LAB — LIPID PANEL
Chol/HDL Ratio: 2.4 ratio (ref 0.0–5.0)
Cholesterol, Total: 111 mg/dL (ref 100–199)
HDL: 47 mg/dL (ref >39)
LDL Chol Calc (NIH): 49 mg/dL (ref 0–99)
Triglycerides: 69 mg/dL (ref 0–149)
VLDL Cholesterol Cal: 15 mg/dL (ref 5–40)

## 2023-01-01 LAB — CBC WITH DIFFERENTIAL/PLATELET
Basophils Absolute: 0 10*3/uL (ref 0.0–0.2)
Basos: 0 %
EOS (ABSOLUTE): 0.1 10*3/uL (ref 0.0–0.4)
Eos: 2 %
Hematocrit: 37.1 % — ABNORMAL LOW (ref 37.5–51.0)
Hemoglobin: 12.8 g/dL — ABNORMAL LOW (ref 13.0–17.7)
Immature Grans (Abs): 0 10*3/uL (ref 0.0–0.1)
Immature Granulocytes: 0 %
Lymphocytes Absolute: 2.3 10*3/uL (ref 0.7–3.1)
Lymphs: 36 %
MCH: 29.2 pg (ref 26.6–33.0)
MCHC: 34.5 g/dL (ref 31.5–35.7)
MCV: 85 fL (ref 79–97)
Monocytes Absolute: 0.4 10*3/uL (ref 0.1–0.9)
Monocytes: 7 %
Neutrophils Absolute: 3.5 10*3/uL (ref 1.4–7.0)
Neutrophils: 55 %
Platelets: 183 10*3/uL (ref 150–450)
RBC: 4.39 x10E6/uL (ref 4.14–5.80)
RDW: 13.1 % (ref 11.6–15.4)
WBC: 6.3 10*3/uL (ref 3.4–10.8)

## 2023-01-02 DIAGNOSIS — M47819 Spondylosis without myelopathy or radiculopathy, site unspecified: Secondary | ICD-10-CM | POA: Diagnosis not present

## 2023-01-21 DIAGNOSIS — S39012A Strain of muscle, fascia and tendon of lower back, initial encounter: Secondary | ICD-10-CM | POA: Diagnosis not present

## 2023-01-21 DIAGNOSIS — M792 Neuralgia and neuritis, unspecified: Secondary | ICD-10-CM | POA: Diagnosis not present

## 2023-01-21 DIAGNOSIS — G8929 Other chronic pain: Secondary | ICD-10-CM | POA: Diagnosis not present

## 2023-01-21 DIAGNOSIS — M47819 Spondylosis without myelopathy or radiculopathy, site unspecified: Secondary | ICD-10-CM | POA: Diagnosis not present

## 2023-01-21 DIAGNOSIS — M549 Dorsalgia, unspecified: Secondary | ICD-10-CM | POA: Diagnosis not present

## 2023-01-21 DIAGNOSIS — E6609 Other obesity due to excess calories: Secondary | ICD-10-CM | POA: Diagnosis not present

## 2023-01-21 DIAGNOSIS — Z79899 Other long term (current) drug therapy: Secondary | ICD-10-CM | POA: Diagnosis not present

## 2023-01-21 DIAGNOSIS — R5383 Other fatigue: Secondary | ICD-10-CM | POA: Diagnosis not present

## 2023-01-21 DIAGNOSIS — Z6834 Body mass index (BMI) 34.0-34.9, adult: Secondary | ICD-10-CM | POA: Diagnosis not present

## 2023-01-21 DIAGNOSIS — E538 Deficiency of other specified B group vitamins: Secondary | ICD-10-CM | POA: Diagnosis not present

## 2023-01-22 DIAGNOSIS — M47819 Spondylosis without myelopathy or radiculopathy, site unspecified: Secondary | ICD-10-CM | POA: Diagnosis not present

## 2023-02-01 DIAGNOSIS — M47819 Spondylosis without myelopathy or radiculopathy, site unspecified: Secondary | ICD-10-CM | POA: Diagnosis not present

## 2023-02-08 ENCOUNTER — Ambulatory Visit: Payer: Medicare HMO | Admitting: Family Medicine

## 2023-02-08 ENCOUNTER — Telehealth: Payer: Self-pay | Admitting: Family Medicine

## 2023-02-08 DIAGNOSIS — Z0279 Encounter for issue of other medical certificate: Secondary | ICD-10-CM

## 2023-02-08 NOTE — Telephone Encounter (Signed)
Alex [name] dropped off Handicap forms to be completed and signed.  Form Fee Paid? (Y/N)       yes     If NO, form is placed on front office manager desk to hold until payment received. If YES, then form will be placed in the RX/HH Nurse Coordinators box for completion.  Form will not be processed until payment is received   Please mark #1 Total Disbility on form. Call pt, when ready!

## 2023-02-08 NOTE — Telephone Encounter (Signed)
Information completed and forwarded to PCP 

## 2023-02-11 NOTE — Telephone Encounter (Signed)
Aware handicap form ready

## 2023-02-20 DIAGNOSIS — Z79899 Other long term (current) drug therapy: Secondary | ICD-10-CM | POA: Diagnosis not present

## 2023-02-20 DIAGNOSIS — M62830 Muscle spasm of back: Secondary | ICD-10-CM | POA: Diagnosis not present

## 2023-02-20 DIAGNOSIS — I10 Essential (primary) hypertension: Secondary | ICD-10-CM | POA: Diagnosis not present

## 2023-02-20 DIAGNOSIS — Z6833 Body mass index (BMI) 33.0-33.9, adult: Secondary | ICD-10-CM | POA: Diagnosis not present

## 2023-02-20 DIAGNOSIS — M792 Neuralgia and neuritis, unspecified: Secondary | ICD-10-CM | POA: Diagnosis not present

## 2023-02-20 DIAGNOSIS — S39012A Strain of muscle, fascia and tendon of lower back, initial encounter: Secondary | ICD-10-CM | POA: Diagnosis not present

## 2023-02-20 DIAGNOSIS — M47819 Spondylosis without myelopathy or radiculopathy, site unspecified: Secondary | ICD-10-CM | POA: Diagnosis not present

## 2023-02-20 DIAGNOSIS — E119 Type 2 diabetes mellitus without complications: Secondary | ICD-10-CM | POA: Diagnosis not present

## 2023-02-20 DIAGNOSIS — M549 Dorsalgia, unspecified: Secondary | ICD-10-CM | POA: Diagnosis not present

## 2023-02-20 DIAGNOSIS — E6609 Other obesity due to excess calories: Secondary | ICD-10-CM | POA: Diagnosis not present

## 2023-02-21 ENCOUNTER — Other Ambulatory Visit: Payer: Self-pay | Admitting: Family Medicine

## 2023-02-21 DIAGNOSIS — L219 Seborrheic dermatitis, unspecified: Secondary | ICD-10-CM

## 2023-02-21 DIAGNOSIS — E1159 Type 2 diabetes mellitus with other circulatory complications: Secondary | ICD-10-CM

## 2023-02-22 DIAGNOSIS — M47819 Spondylosis without myelopathy or radiculopathy, site unspecified: Secondary | ICD-10-CM | POA: Diagnosis not present

## 2023-03-04 DIAGNOSIS — M47819 Spondylosis without myelopathy or radiculopathy, site unspecified: Secondary | ICD-10-CM | POA: Diagnosis not present

## 2023-03-14 DIAGNOSIS — G8929 Other chronic pain: Secondary | ICD-10-CM | POA: Diagnosis not present

## 2023-03-14 DIAGNOSIS — Z96651 Presence of right artificial knee joint: Secondary | ICD-10-CM | POA: Diagnosis not present

## 2023-03-14 DIAGNOSIS — M25561 Pain in right knee: Secondary | ICD-10-CM | POA: Diagnosis not present

## 2023-03-20 DIAGNOSIS — G8929 Other chronic pain: Secondary | ICD-10-CM | POA: Diagnosis not present

## 2023-03-20 DIAGNOSIS — Z79899 Other long term (current) drug therapy: Secondary | ICD-10-CM | POA: Diagnosis not present

## 2023-03-20 DIAGNOSIS — S39012A Strain of muscle, fascia and tendon of lower back, initial encounter: Secondary | ICD-10-CM | POA: Diagnosis not present

## 2023-03-20 DIAGNOSIS — M47819 Spondylosis without myelopathy or radiculopathy, site unspecified: Secondary | ICD-10-CM | POA: Diagnosis not present

## 2023-03-20 DIAGNOSIS — M62830 Muscle spasm of back: Secondary | ICD-10-CM | POA: Diagnosis not present

## 2023-03-20 DIAGNOSIS — M792 Neuralgia and neuritis, unspecified: Secondary | ICD-10-CM | POA: Diagnosis not present

## 2023-03-20 DIAGNOSIS — M25561 Pain in right knee: Secondary | ICD-10-CM | POA: Diagnosis not present

## 2023-03-20 DIAGNOSIS — M549 Dorsalgia, unspecified: Secondary | ICD-10-CM | POA: Diagnosis not present

## 2023-03-20 DIAGNOSIS — Z96651 Presence of right artificial knee joint: Secondary | ICD-10-CM | POA: Diagnosis not present

## 2023-03-20 DIAGNOSIS — M25562 Pain in left knee: Secondary | ICD-10-CM | POA: Diagnosis not present

## 2023-03-25 DIAGNOSIS — M47819 Spondylosis without myelopathy or radiculopathy, site unspecified: Secondary | ICD-10-CM | POA: Diagnosis not present

## 2023-03-26 DIAGNOSIS — Z79899 Other long term (current) drug therapy: Secondary | ICD-10-CM | POA: Diagnosis not present

## 2023-04-03 ENCOUNTER — Ambulatory Visit: Payer: Medicare HMO | Admitting: Family Medicine

## 2023-04-03 ENCOUNTER — Telehealth: Payer: Self-pay | Admitting: Family Medicine

## 2023-04-03 NOTE — Telephone Encounter (Signed)
Pt states that he picks up his Rx here in our office.  Will route to Raynelle Fanning to make aware and to make sure pt has a shipment coming.

## 2023-04-04 DIAGNOSIS — M47819 Spondylosis without myelopathy or radiculopathy, site unspecified: Secondary | ICD-10-CM | POA: Diagnosis not present

## 2023-04-05 NOTE — Telephone Encounter (Signed)
Spoke with novo nordisk. Rep was able to process shipment. Previous shipment never went out 02/10/23. Should arrive to office in 10-14 business days.  Patient informed.

## 2023-04-16 NOTE — Telephone Encounter (Signed)
Pt wants ozempic called into [facility] pharmacy since samples have not come in yet

## 2023-04-16 NOTE — Telephone Encounter (Signed)
Notified patient of shipment Ozempic 2mg  weekly

## 2023-04-19 ENCOUNTER — Encounter: Payer: Self-pay | Admitting: Family Medicine

## 2023-04-19 ENCOUNTER — Ambulatory Visit (INDEPENDENT_AMBULATORY_CARE_PROVIDER_SITE_OTHER): Payer: Medicare HMO | Admitting: Family Medicine

## 2023-04-19 VITALS — BP 126/81 | HR 69 | Ht 66.0 in | Wt 206.0 lb

## 2023-04-19 DIAGNOSIS — E785 Hyperlipidemia, unspecified: Secondary | ICD-10-CM

## 2023-04-19 DIAGNOSIS — E1142 Type 2 diabetes mellitus with diabetic polyneuropathy: Secondary | ICD-10-CM

## 2023-04-19 DIAGNOSIS — E1159 Type 2 diabetes mellitus with other circulatory complications: Secondary | ICD-10-CM

## 2023-04-19 DIAGNOSIS — E1169 Type 2 diabetes mellitus with other specified complication: Secondary | ICD-10-CM | POA: Diagnosis not present

## 2023-04-19 DIAGNOSIS — G8929 Other chronic pain: Secondary | ICD-10-CM | POA: Diagnosis not present

## 2023-04-19 DIAGNOSIS — Z79899 Other long term (current) drug therapy: Secondary | ICD-10-CM | POA: Diagnosis not present

## 2023-04-19 DIAGNOSIS — R5383 Other fatigue: Secondary | ICD-10-CM | POA: Diagnosis not present

## 2023-04-19 DIAGNOSIS — E6609 Other obesity due to excess calories: Secondary | ICD-10-CM | POA: Diagnosis not present

## 2023-04-19 DIAGNOSIS — Z23 Encounter for immunization: Secondary | ICD-10-CM

## 2023-04-19 DIAGNOSIS — Z6833 Body mass index (BMI) 33.0-33.9, adult: Secondary | ICD-10-CM

## 2023-04-19 DIAGNOSIS — M47819 Spondylosis without myelopathy or radiculopathy, site unspecified: Secondary | ICD-10-CM | POA: Diagnosis not present

## 2023-04-19 DIAGNOSIS — E669 Obesity, unspecified: Secondary | ICD-10-CM | POA: Diagnosis not present

## 2023-04-19 DIAGNOSIS — Z7985 Long-term (current) use of injectable non-insulin antidiabetic drugs: Secondary | ICD-10-CM | POA: Diagnosis not present

## 2023-04-19 DIAGNOSIS — I152 Hypertension secondary to endocrine disorders: Secondary | ICD-10-CM

## 2023-04-19 DIAGNOSIS — M62830 Muscle spasm of back: Secondary | ICD-10-CM | POA: Diagnosis not present

## 2023-04-19 DIAGNOSIS — M25561 Pain in right knee: Secondary | ICD-10-CM | POA: Diagnosis not present

## 2023-04-19 DIAGNOSIS — M549 Dorsalgia, unspecified: Secondary | ICD-10-CM | POA: Diagnosis not present

## 2023-04-19 DIAGNOSIS — Z131 Encounter for screening for diabetes mellitus: Secondary | ICD-10-CM | POA: Diagnosis not present

## 2023-04-19 DIAGNOSIS — Z13220 Encounter for screening for lipoid disorders: Secondary | ICD-10-CM | POA: Diagnosis not present

## 2023-04-19 DIAGNOSIS — M792 Neuralgia and neuritis, unspecified: Secondary | ICD-10-CM | POA: Diagnosis not present

## 2023-04-19 LAB — BAYER DCA HB A1C WAIVED: HB A1C (BAYER DCA - WAIVED): 5.2 % (ref 4.8–5.6)

## 2023-04-19 MED ORDER — FARXIGA 10 MG PO TABS
10.0000 mg | ORAL_TABLET | Freq: Every day | ORAL | 3 refills | Status: DC
Start: 2023-04-19 — End: 2024-04-16

## 2023-04-19 NOTE — Progress Notes (Signed)
BP 126/81   Pulse 69   Ht 5\' 6"  (1.676 m)   Wt 206 lb (93.4 kg)   SpO2 96%   BMI 33.25 kg/m    Subjective:   Patient ID: Alex [name], male    DOB: 1961/03/07, 62 y.o.   MRN: [id number]  HPI: Alex [name] is a 62 y.o. male presenting on 04/19/2023 for Medical Management of Chronic Issues, Diabetes, and Hypertension   HPI Type 2 diabetes mellitus Patient comes in today for recheck of his diabetes. Patient has been currently taking ozempic and farxiga. Patient is currently on an ACE inhibitor/ARB. Patient has not seen an ophthalmologist this year. Patient denies any new issues with their feet. The symptom started onset as an adult hypertension and hyperlipidemia ARE RELATED TO DM   Hyperlipidemia Patient is coming in for recheck of his hyperlipidemia. The patient is currently taking Crestor. They deny any issues with myalgias or history of liver damage from it. They deny any focal numbness or weakness or chest pain.   Hypertension Patient is currently on amlodipine and lisinopril, and their blood pressure today is 126/81. Patient denies any lightheadedness or dizziness. Patient denies headaches, blurred vision, chest pains, shortness of breath, or weakness. Denies any side effects from medication and is content with current medication.   Relevant past medical, surgical, family and social history reviewed and updated as indicated. Interim medical history since our last visit reviewed. Allergies and medications reviewed and updated.  Review of Systems  Constitutional:  Negative for chills and fever.  Eyes:  Negative for visual disturbance.  Respiratory:  Negative for shortness of breath and wheezing.   Cardiovascular:  Negative for chest pain and leg swelling.  Musculoskeletal:  Positive for arthralgias. Negative for back pain and gait problem.  Skin:  Negative for rash.  Neurological:  Negative for dizziness, weakness and light-headedness.  All other systems reviewed and are  negative.   Per HPI unless specifically indicated above   Allergies as of 04/19/2023       Reactions   Morphine And Codeine Nausea Only   Buprenorphine Hcl-naloxone Hcl Nausea Only   Codeine Nausea Only, Other (See Comments)   per pt, "felt weird in my head"        Medication List        Accurate as of April 19, 2023  2:27 PM. If you have any questions, ask your nurse or doctor.          amLODipine 5 MG tablet Commonly known as: NORVASC TAKE 2 TABLETS DAILY AS DIRECTED   calcium-vitamin D 500-5 MG-MCG tablet Commonly known as: OSCAL WITH D Take 1 tablet by mouth daily with breakfast.   celecoxib 200 MG capsule Commonly known as: CELEBREX Take 1 capsule by mouth daily.   diazepam 5 MG tablet Commonly known as: VALIUM Take 1 tablet (5 mg total) by mouth daily as needed for anxiety.   diclofenac 75 MG EC tablet Commonly known as: VOLTAREN Take 1 tablet (75 mg total) by mouth 2 (two) times daily.   diclofenac Sodium 1 % Gel Commonly known as: Voltaren Apply 2 g topically 4 (four) times daily.   dicyclomine 10 MG capsule Commonly known as: Bentyl Take 1 capsule (10 mg total) by mouth 4 (four) times daily -  before meals and at bedtime.   Farxiga 10 MG Tabs tablet Generic drug: dapagliflozin propanediol Take 1 tablet (10 mg total) by mouth daily.   fluticasone 50 MCG/ACT nasal spray Commonly known as: FLONASE  USE 1 SPRAY IN EACH NOSTRIL 2 TIMES A DAY AS NEEDED FOR ALLERGIES   HYDROcodone-acetaminophen 10-325 MG tablet Commonly known as: NORCO Take 1 tablet by mouth daily. Take 4 tablets per day for one week   #28 Take 3 tablets per day for one week   #21 Take 2 tablets per day for one week   #14 Take 1 tablet per day for one week      #7  then stop Total #70 tablets   ibuprofen 800 MG tablet Commonly known as: ADVIL Take 800 mg by mouth 3 (three) times daily.   ketoconazole 2 % shampoo Commonly known as: NIZORAL TWICE A WEEK FOR UP TO 4 WEEKS,  SHAMPOO AS DIRECTED, LEAVE IN FOR 10 MINUTES, THEN RINSE.   lisinopril 40 MG tablet Commonly known as: ZESTRIL Take 1 tablet (40 mg total) by mouth daily.   nabumetone 750 MG tablet Commonly known as: RELAFEN Take 1 tablet (750 mg total) by mouth 2 (two) times daily as needed.   pantoprazole 40 MG tablet Commonly known as: PROTONIX Take 1 tablet (40 mg total) by mouth 2 (two) times daily before a meal.   rosuvastatin 10 MG tablet Commonly known as: CRESTOR Take 1 tablet (10 mg total) by mouth daily.   Semaglutide (2 MG/DOSE) 8 MG/3ML Sopn Inject 2 mg as directed once a week.   venlafaxine XR 37.5 MG 24 hr capsule Commonly known as: Effexor XR Take 1 capsule (37.5 mg total) by mouth daily.         Objective:   BP 126/81   Pulse 69   Ht 5\' 6"  (1.676 m)   Wt 206 lb (93.4 kg)   SpO2 96%   BMI 33.25 kg/m   Wt Readings from Last 3 Encounters:  04/19/23 206 lb (93.4 kg)  12/31/22 215 lb (97.5 kg)  09/19/22 223 lb (101.2 kg)    Physical Exam Vitals and nursing note reviewed.  Constitutional:      General: He is not in acute distress.    Appearance: He is well-developed. He is not diaphoretic.  Eyes:     General: No scleral icterus.    Conjunctiva/sclera: Conjunctivae normal.  Neck:     Thyroid: No thyromegaly.  Cardiovascular:     Rate and Rhythm: Normal rate and regular rhythm.     Heart sounds: Normal heart sounds. No murmur heard. Pulmonary:     Effort: Pulmonary effort is normal. No respiratory distress.     Breath sounds: Normal breath sounds. No wheezing.  Musculoskeletal:        General: No swelling. Normal range of motion.     Cervical back: Neck supple.  Lymphadenopathy:     Cervical: No cervical adenopathy.  Skin:    General: Skin is warm and dry.     Findings: No rash.  Neurological:     Mental Status: He is alert and oriented to person, place, and time.     Coordination: Coordination normal.  Psychiatric:        Behavior: Behavior normal.        Assessment & Plan:   Problem List Items Addressed This Visit       Cardiovascular and Mediastinum   Hypertension associated with diabetes (HCC)   Relevant Medications   FARXIGA 10 MG TABS tablet   Other Relevant Orders   CBC with Differential/Platelet   CMP14+EGFR   Lipid panel   Bayer DCA Hb A1c Waived     Endocrine   Type 2  diabetes mellitus with obesity (HCC)   Relevant Medications   FARXIGA 10 MG TABS tablet   Hyperlipidemia associated with type 2 diabetes mellitus (HCC)   Relevant Medications   FARXIGA 10 MG TABS tablet   Other Relevant Orders   CBC with Differential/Platelet   CMP14+EGFR   Lipid panel   Bayer DCA Hb A1c Waived   Other Visit Diagnoses     Type 2 diabetes mellitus with diabetic polyneuropathy, without long-term current use of insulin (HCC)    -  Primary   Relevant Medications   FARXIGA 10 MG TABS tablet   Other Relevant Orders   CBC with Differential/Platelet   CMP14+EGFR   Lipid panel   Bayer DCA Hb A1c Waived      A1c is good at 5.2. no changes  Follow up plan: Return in about 3 months (around 07/20/2023), or if symptoms worsen or fail to improve, for diabetes.  Counseling provided for all of the vaccine components Orders Placed This Encounter  Procedures   CBC with Differential/Platelet   CMP14+EGFR   Lipid panel   Bayer DCA Hb A1c Waived    Arville Care, MD [facility] 04/19/2023, 2:27 PM

## 2023-04-20 LAB — CBC WITH DIFFERENTIAL/PLATELET
Basophils Absolute: 0 10*3/uL (ref 0.0–0.2)
Basos: 0 %
EOS (ABSOLUTE): 0.1 10*3/uL (ref 0.0–0.4)
Eos: 1 %
Hematocrit: 35.4 % — ABNORMAL LOW (ref 37.5–51.0)
Hemoglobin: 11.8 g/dL — ABNORMAL LOW (ref 13.0–17.7)
Immature Grans (Abs): 0 10*3/uL (ref 0.0–0.1)
Immature Granulocytes: 0 %
Lymphocytes Absolute: 2 10*3/uL (ref 0.7–3.1)
Lymphs: 32 %
MCH: 29.4 pg (ref 26.6–33.0)
MCHC: 33.3 g/dL (ref 31.5–35.7)
MCV: 88 fL (ref 79–97)
Monocytes Absolute: 0.4 10*3/uL (ref 0.1–0.9)
Monocytes: 7 %
Neutrophils Absolute: 3.6 10*3/uL (ref 1.4–7.0)
Neutrophils: 60 %
Platelets: 218 10*3/uL (ref 150–450)
RBC: 4.01 x10E6/uL — ABNORMAL LOW (ref 4.14–5.80)
RDW: 13.8 % (ref 11.6–15.4)
WBC: 6.2 10*3/uL (ref 3.4–10.8)

## 2023-04-20 LAB — CMP14+EGFR
ALT: 18 [IU]/L (ref 0–44)
AST: 20 [IU]/L (ref 0–40)
Albumin: 4.1 g/dL (ref 3.9–4.9)
Alkaline Phosphatase: 77 [IU]/L (ref 44–121)
BUN/Creatinine Ratio: 12 (ref 10–24)
BUN: 9 mg/dL (ref 8–27)
Bilirubin Total: 0.3 mg/dL (ref 0.0–1.2)
CO2: 19 mmol/L — ABNORMAL LOW (ref 20–29)
Calcium: 8.8 mg/dL (ref 8.6–10.2)
Chloride: 105 mmol/L (ref 96–106)
Creatinine, Ser: 0.78 mg/dL (ref 0.76–1.27)
Globulin, Total: 2.1 g/dL (ref 1.5–4.5)
Glucose: 125 mg/dL — ABNORMAL HIGH (ref 70–99)
Potassium: 3.9 mmol/L (ref 3.5–5.2)
Sodium: 139 mmol/L (ref 134–144)
Total Protein: 6.2 g/dL (ref 6.0–8.5)
eGFR: 101 mL/min/{1.73_m2} (ref >59)

## 2023-04-20 LAB — LIPID PANEL
Chol/HDL Ratio: 2.3 {ratio} (ref 0.0–5.0)
Cholesterol, Total: 110 mg/dL (ref 100–199)
HDL: 48 mg/dL (ref >39)
LDL Chol Calc (NIH): 46 mg/dL (ref 0–99)
Triglycerides: 82 mg/dL (ref 0–149)
VLDL Cholesterol Cal: 16 mg/dL (ref 5–40)

## 2023-04-24 DIAGNOSIS — M47819 Spondylosis without myelopathy or radiculopathy, site unspecified: Secondary | ICD-10-CM | POA: Diagnosis not present

## 2023-04-24 DIAGNOSIS — M25561 Pain in right knee: Secondary | ICD-10-CM | POA: Diagnosis not present

## 2023-05-03 ENCOUNTER — Ambulatory Visit: Payer: Medicare HMO | Admitting: Family Medicine

## 2023-05-04 DIAGNOSIS — M25561 Pain in right knee: Secondary | ICD-10-CM | POA: Diagnosis not present

## 2023-05-04 DIAGNOSIS — M62830 Muscle spasm of back: Secondary | ICD-10-CM | POA: Diagnosis not present

## 2023-05-04 DIAGNOSIS — S39012A Strain of muscle, fascia and tendon of lower back, initial encounter: Secondary | ICD-10-CM | POA: Diagnosis not present

## 2023-05-20 DIAGNOSIS — M19049 Primary osteoarthritis, unspecified hand: Secondary | ICD-10-CM | POA: Diagnosis not present

## 2023-05-20 DIAGNOSIS — M47819 Spondylosis without myelopathy or radiculopathy, site unspecified: Secondary | ICD-10-CM | POA: Diagnosis not present

## 2023-05-20 DIAGNOSIS — Z6833 Body mass index (BMI) 33.0-33.9, adult: Secondary | ICD-10-CM | POA: Diagnosis not present

## 2023-05-20 DIAGNOSIS — M6530 Trigger finger, unspecified finger: Secondary | ICD-10-CM | POA: Diagnosis not present

## 2023-05-20 DIAGNOSIS — G8929 Other chronic pain: Secondary | ICD-10-CM | POA: Diagnosis not present

## 2023-05-20 DIAGNOSIS — M542 Cervicalgia: Secondary | ICD-10-CM | POA: Diagnosis not present

## 2023-05-20 DIAGNOSIS — M25561 Pain in right knee: Secondary | ICD-10-CM | POA: Diagnosis not present

## 2023-05-20 DIAGNOSIS — R030 Elevated blood-pressure reading, without diagnosis of hypertension: Secondary | ICD-10-CM | POA: Diagnosis not present

## 2023-05-20 DIAGNOSIS — F1120 Opioid dependence, uncomplicated: Secondary | ICD-10-CM | POA: Diagnosis not present

## 2023-05-23 ENCOUNTER — Ambulatory Visit: Payer: Medicare HMO

## 2023-05-25 DIAGNOSIS — M62830 Muscle spasm of back: Secondary | ICD-10-CM | POA: Diagnosis not present

## 2023-05-25 DIAGNOSIS — S39012A Strain of muscle, fascia and tendon of lower back, initial encounter: Secondary | ICD-10-CM | POA: Diagnosis not present

## 2023-05-25 DIAGNOSIS — M25561 Pain in right knee: Secondary | ICD-10-CM | POA: Diagnosis not present

## 2023-05-28 ENCOUNTER — Telehealth: Payer: Self-pay

## 2023-05-28 NOTE — Patient Outreach (Signed)
Attempted to contact patient regarding DM eye. Left voicemail for patient to return my call at [phone].  Nicholes Rough, CMA Care Guide [employer]

## 2023-06-03 ENCOUNTER — Telehealth: Payer: Self-pay | Admitting: Family Medicine

## 2023-06-03 ENCOUNTER — Other Ambulatory Visit: Payer: Self-pay | Admitting: Family Medicine

## 2023-06-03 DIAGNOSIS — E1159 Type 2 diabetes mellitus with other circulatory complications: Secondary | ICD-10-CM

## 2023-06-03 NOTE — Telephone Encounter (Signed)
  Prescription Request  06/03/2023  Is this a "Controlled Substance" medicine? no  Have you seen your PCP in the last 2 weeks? no  If YES, route message to pool  -  If NO, patient needs to be scheduled for appointment.  What is the name of the medication or equipment? A rx for stroke. He does not know that name of the rx. I explained that nurse needs to know the name of rx. He says that he threw away the bottle and that he think the rx is 5%. Pt said he would try to find ppw with name of rx. He says he needs this rx before the holiday because he is going out of town.  Have you contacted your pharmacy to request a refill? no   Which pharmacy would you like this sent to? [facility]   Patient notified that their request is being sent to the clinical staff for review and that they should receive a response within 2 business days.

## 2023-06-03 NOTE — Telephone Encounter (Signed)
No answer and vmail not setup.

## 2023-06-03 NOTE — Telephone Encounter (Signed)
Copied from CRM [id number]. Topic: Clinical - Medication Refill >> Jun 03, 2023 12:25 PM Desma Mcgregor wrote: Most Recent Primary Care Visit:  Provider: Arville Care A  Department: [facility]  Visit Type: OFFICE VISIT  Date: 04/19/2023  Medication: amLODipine (NORVASC) 5 MG tablet  Has the patient contacted their pharmacy? Yes (Agent: If no, request that the patient contact the pharmacy for the refill. If patient does not wish to contact the pharmacy document the reason why and proceed with request.) (Agent: If yes, when and what did the pharmacy advise?)  Is this the correct pharmacy for this prescription? Yes If no, delete pharmacy and type the correct one.  This is the patient's preferred pharmacy:  [facility] [place], [place] - [address] [place] [address] Phone: [phone] Fax: [phone]  Has the prescription been filled recently? Yes  Is the patient out of the medication? Yes  Has the patient been seen for an appointment in the last year OR does the patient have an upcoming appointment? Yes  Can we respond through MyChart? No  Agent: Please be advised that Rx refills may take up to 3 business days. We ask that you follow-up with your pharmacy.  Note: Pt also stated that his Rxs are being sent to [facility]. He was not happy about that and is not going there to get his meds.He wants them all to go to [facility]. Pt cb# [phone].

## 2023-06-03 NOTE — Telephone Encounter (Signed)
I do not really know what he wants for this, prescription for stroke?  Did he have a recent stroke but I do not see in his chart?  Sometimes the neurologist will place the patient on clopidogrel or Plavix 75 mg for 21 days after stroke but not usually continue after that?  We also have him on a statin which is to help prevent future strokes and he should be taking an aspirin but the aspirin is not a prescription?  I do not know what he is talking about for sure.

## 2023-06-03 NOTE — Telephone Encounter (Signed)
amlodipine 5 mg   Is   the medication pt needs pt needs his meds to [facility] pharmacy as well

## 2023-06-03 NOTE — Telephone Encounter (Signed)
Do you have any idea which medication the pt might be talking about? Please advise

## 2023-06-04 ENCOUNTER — Telehealth: Payer: Self-pay | Admitting: Family Medicine

## 2023-06-04 ENCOUNTER — Other Ambulatory Visit: Payer: Self-pay

## 2023-06-04 DIAGNOSIS — I152 Hypertension secondary to endocrine disorders: Secondary | ICD-10-CM

## 2023-06-04 DIAGNOSIS — S39012A Strain of muscle, fascia and tendon of lower back, initial encounter: Secondary | ICD-10-CM | POA: Diagnosis not present

## 2023-06-04 DIAGNOSIS — M62830 Muscle spasm of back: Secondary | ICD-10-CM | POA: Diagnosis not present

## 2023-06-04 DIAGNOSIS — M25561 Pain in right knee: Secondary | ICD-10-CM | POA: Diagnosis not present

## 2023-06-04 MED ORDER — AMLODIPINE BESYLATE 5 MG PO TABS
10.0000 mg | ORAL_TABLET | Freq: Every day | ORAL | 1 refills | Status: DC
Start: 2023-06-04 — End: 2023-06-04

## 2023-06-04 MED ORDER — AMLODIPINE BESYLATE 5 MG PO TABS
5.0000 mg | ORAL_TABLET | Freq: Every day | ORAL | 0 refills | Status: DC
Start: 2023-06-04 — End: 2023-06-17

## 2023-06-04 NOTE — Telephone Encounter (Signed)
Refills sent to [facility]

## 2023-06-04 NOTE — Telephone Encounter (Signed)
I spoke with Sharia Reeve at [facility] advising him of provider feedback and he voiced understanding and made a note in pt's meds.

## 2023-06-04 NOTE — Telephone Encounter (Signed)
Copied from CRM [id number]. Topic: Clinical - Medication Question >> Jun 04, 2023  9:32 AM Fuller Mandril wrote: Reason for CRM: Josh from [facility] called to confirm Rx directions for amLODipine (NORVASC). States he received 2 Rx at the same time for this Pt with different directions and needs to confirm which is correct. Callback # [phone]

## 2023-06-04 NOTE — Telephone Encounter (Signed)
Dettinger,  Does pt take 1 or 2 tablets of Amlodipine 5mg  daily?

## 2023-06-04 NOTE — Telephone Encounter (Signed)
He is supposed to take 2 tablets for a total of 10 mg daily

## 2023-06-05 ENCOUNTER — Telehealth: Payer: Self-pay | Admitting: Family Medicine

## 2023-06-17 ENCOUNTER — Telehealth: Payer: Self-pay | Admitting: Family Medicine

## 2023-06-17 DIAGNOSIS — E1159 Type 2 diabetes mellitus with other circulatory complications: Secondary | ICD-10-CM

## 2023-06-17 DIAGNOSIS — L219 Seborrheic dermatitis, unspecified: Secondary | ICD-10-CM

## 2023-06-17 MED ORDER — KETOCONAZOLE 2 % EX SHAM: MEDICATED_SHAMPOO | CUTANEOUS | 1 refills | Status: DC

## 2023-06-17 MED ORDER — ROSUVASTATIN CALCIUM 10 MG PO TABS: 10.0000 mg | ORAL_TABLET | Freq: Every day | ORAL | 3 refills | Status: DC

## 2023-06-17 MED ORDER — AMLODIPINE BESYLATE 5 MG PO TABS: 5.0000 mg | ORAL_TABLET | Freq: Every day | ORAL | 3 refills | Status: DC

## 2023-06-17 MED ORDER — LISINOPRIL 40 MG PO TABS: 40.0000 mg | ORAL_TABLET | Freq: Every day | ORAL | 3 refills | Status: DC

## 2023-06-17 NOTE — Telephone Encounter (Signed)
Refills sent

## 2023-06-17 NOTE — Telephone Encounter (Signed)
Copied from CRM [id number]. Topic: Clinical - Medication Refill >> Jun 17, 2023 12:54 PM Louie Boston wrote: Most Recent Primary Care Visit:  Provider: Arville Care A  Department: [facility]  Visit Type: OFFICE VISIT  Date: 04/19/2023  Medication: rosuvastatin (CRESTOR) 10 MG tablet, lisinopril (ZESTRIL) 40 MG tablet, ketoconazole (NIZORAL) 2 % shampoo, amLODipine (NORVASC) 5 MG tablet  Has the patient contacted their pharmacy? Yes (Agent: If no, request that the patient contact the pharmacy for the refill. If patient does not wish to contact the pharmacy document the reason why and proceed with request.) (Agent: If yes, when and what did the pharmacy advise?)  Patient was advised by pharmacy that no medication refills are available and that he needed to contact his doctor. Patient is out of medication and patient called last week for refill.   Is this the correct pharmacy for this prescription? Yes If no, delete pharmacy and type the correct one.  This is the patient's preferred pharmacy:  [facility] [place], [place] - [address] [place] [address] Phone: [phone] Fax: [phone]   Has the prescription been filled recently? No  Is the patient out of the medication? Yes  Has the patient been seen for an appointment in the last year OR does the patient have an upcoming appointment? Yes  Can we respond through MyChart? No  Agent: Please be advised that Rx refills may take up to 3 business days. We ask that you follow-up with your pharmacy.

## 2023-06-19 DIAGNOSIS — R030 Elevated blood-pressure reading, without diagnosis of hypertension: Secondary | ICD-10-CM | POA: Diagnosis not present

## 2023-06-19 DIAGNOSIS — I10 Essential (primary) hypertension: Secondary | ICD-10-CM | POA: Diagnosis not present

## 2023-06-19 DIAGNOSIS — M6530 Trigger finger, unspecified finger: Secondary | ICD-10-CM | POA: Diagnosis not present

## 2023-06-19 DIAGNOSIS — M25561 Pain in right knee: Secondary | ICD-10-CM | POA: Diagnosis not present

## 2023-06-19 DIAGNOSIS — Z6834 Body mass index (BMI) 34.0-34.9, adult: Secondary | ICD-10-CM | POA: Diagnosis not present

## 2023-06-19 DIAGNOSIS — G8929 Other chronic pain: Secondary | ICD-10-CM | POA: Diagnosis not present

## 2023-06-19 DIAGNOSIS — M542 Cervicalgia: Secondary | ICD-10-CM | POA: Diagnosis not present

## 2023-06-19 DIAGNOSIS — F1120 Opioid dependence, uncomplicated: Secondary | ICD-10-CM | POA: Diagnosis not present

## 2023-06-19 DIAGNOSIS — M19049 Primary osteoarthritis, unspecified hand: Secondary | ICD-10-CM | POA: Diagnosis not present

## 2023-06-23 DIAGNOSIS — M25561 Pain in right knee: Secondary | ICD-10-CM | POA: Diagnosis not present

## 2023-06-23 DIAGNOSIS — M19049 Primary osteoarthritis, unspecified hand: Secondary | ICD-10-CM | POA: Diagnosis not present

## 2023-06-23 DIAGNOSIS — M542 Cervicalgia: Secondary | ICD-10-CM | POA: Diagnosis not present

## 2023-06-23 DIAGNOSIS — F1120 Opioid dependence, uncomplicated: Secondary | ICD-10-CM | POA: Diagnosis not present

## 2023-06-23 DIAGNOSIS — M6530 Trigger finger, unspecified finger: Secondary | ICD-10-CM | POA: Diagnosis not present

## 2023-06-23 DIAGNOSIS — I10 Essential (primary) hypertension: Secondary | ICD-10-CM | POA: Diagnosis not present

## 2023-06-23 DIAGNOSIS — G8929 Other chronic pain: Secondary | ICD-10-CM | POA: Diagnosis not present

## 2023-06-23 DIAGNOSIS — Z0130 Encounter for examination of blood pressure without abnormal findings: Secondary | ICD-10-CM | POA: Diagnosis not present

## 2023-06-23 DIAGNOSIS — Z6833 Body mass index (BMI) 33.0-33.9, adult: Secondary | ICD-10-CM | POA: Diagnosis not present

## 2023-06-24 DIAGNOSIS — M25561 Pain in right knee: Secondary | ICD-10-CM | POA: Diagnosis not present

## 2023-06-24 DIAGNOSIS — M62830 Muscle spasm of back: Secondary | ICD-10-CM | POA: Diagnosis not present

## 2023-06-24 DIAGNOSIS — S39012A Strain of muscle, fascia and tendon of lower back, initial encounter: Secondary | ICD-10-CM | POA: Diagnosis not present

## 2023-07-01 DIAGNOSIS — G8929 Other chronic pain: Secondary | ICD-10-CM | POA: Diagnosis not present

## 2023-07-01 DIAGNOSIS — M792 Neuralgia and neuritis, unspecified: Secondary | ICD-10-CM | POA: Diagnosis not present

## 2023-07-01 DIAGNOSIS — Z6833 Body mass index (BMI) 33.0-33.9, adult: Secondary | ICD-10-CM | POA: Diagnosis not present

## 2023-07-01 DIAGNOSIS — M19049 Primary osteoarthritis, unspecified hand: Secondary | ICD-10-CM | POA: Diagnosis not present

## 2023-07-01 DIAGNOSIS — M25561 Pain in right knee: Secondary | ICD-10-CM | POA: Diagnosis not present

## 2023-07-01 DIAGNOSIS — F419 Anxiety disorder, unspecified: Secondary | ICD-10-CM | POA: Diagnosis not present

## 2023-07-01 DIAGNOSIS — Z0130 Encounter for examination of blood pressure without abnormal findings: Secondary | ICD-10-CM | POA: Diagnosis not present

## 2023-07-01 DIAGNOSIS — F1120 Opioid dependence, uncomplicated: Secondary | ICD-10-CM | POA: Diagnosis not present

## 2023-07-01 DIAGNOSIS — M47819 Spondylosis without myelopathy or radiculopathy, site unspecified: Secondary | ICD-10-CM | POA: Diagnosis not present

## 2023-07-04 DIAGNOSIS — M25561 Pain in right knee: Secondary | ICD-10-CM | POA: Diagnosis not present

## 2023-07-04 DIAGNOSIS — M62830 Muscle spasm of back: Secondary | ICD-10-CM | POA: Diagnosis not present

## 2023-07-04 DIAGNOSIS — S39012A Strain of muscle, fascia and tendon of lower back, initial encounter: Secondary | ICD-10-CM | POA: Diagnosis not present

## 2023-07-08 DIAGNOSIS — M792 Neuralgia and neuritis, unspecified: Secondary | ICD-10-CM | POA: Diagnosis not present

## 2023-07-08 DIAGNOSIS — M47819 Spondylosis without myelopathy or radiculopathy, site unspecified: Secondary | ICD-10-CM | POA: Diagnosis not present

## 2023-07-08 DIAGNOSIS — G8929 Other chronic pain: Secondary | ICD-10-CM | POA: Diagnosis not present

## 2023-07-08 DIAGNOSIS — Z6832 Body mass index (BMI) 32.0-32.9, adult: Secondary | ICD-10-CM | POA: Diagnosis not present

## 2023-07-08 DIAGNOSIS — F1120 Opioid dependence, uncomplicated: Secondary | ICD-10-CM | POA: Diagnosis not present

## 2023-07-08 DIAGNOSIS — Z0130 Encounter for examination of blood pressure without abnormal findings: Secondary | ICD-10-CM | POA: Diagnosis not present

## 2023-07-08 DIAGNOSIS — R030 Elevated blood-pressure reading, without diagnosis of hypertension: Secondary | ICD-10-CM | POA: Diagnosis not present

## 2023-07-08 DIAGNOSIS — F419 Anxiety disorder, unspecified: Secondary | ICD-10-CM | POA: Diagnosis not present

## 2023-07-08 DIAGNOSIS — M25561 Pain in right knee: Secondary | ICD-10-CM | POA: Diagnosis not present

## 2023-07-17 ENCOUNTER — Telehealth: Payer: Self-pay

## 2023-07-17 NOTE — Telephone Encounter (Signed)
 Copied from CRM [id number]. Topic: Clinical - Medication Question >> Jul 17, 2023  8:47 AM Geroge Baseman wrote: Reason for CRM: Patient wants to speak with Raynelle Fanning about his medications and making sure his information is updated for the new year

## 2023-07-18 NOTE — Telephone Encounter (Signed)
 Will attempt novo nordisk renewal online. If unsuccessful, will mail application to patients home.

## 2023-07-19 DIAGNOSIS — M549 Dorsalgia, unspecified: Secondary | ICD-10-CM | POA: Diagnosis not present

## 2023-07-19 DIAGNOSIS — F419 Anxiety disorder, unspecified: Secondary | ICD-10-CM | POA: Diagnosis not present

## 2023-07-19 DIAGNOSIS — Z0130 Encounter for examination of blood pressure without abnormal findings: Secondary | ICD-10-CM | POA: Diagnosis not present

## 2023-07-19 DIAGNOSIS — M25561 Pain in right knee: Secondary | ICD-10-CM | POA: Diagnosis not present

## 2023-07-19 DIAGNOSIS — F1120 Opioid dependence, uncomplicated: Secondary | ICD-10-CM | POA: Diagnosis not present

## 2023-07-19 DIAGNOSIS — R7303 Prediabetes: Secondary | ICD-10-CM | POA: Diagnosis not present

## 2023-07-19 DIAGNOSIS — Z0000 Encounter for general adult medical examination without abnormal findings: Secondary | ICD-10-CM | POA: Diagnosis not present

## 2023-07-19 DIAGNOSIS — Z6832 Body mass index (BMI) 32.0-32.9, adult: Secondary | ICD-10-CM | POA: Diagnosis not present

## 2023-07-19 DIAGNOSIS — G8929 Other chronic pain: Secondary | ICD-10-CM | POA: Diagnosis not present

## 2023-07-19 DIAGNOSIS — Z79899 Other long term (current) drug therapy: Secondary | ICD-10-CM | POA: Diagnosis not present

## 2023-07-22 ENCOUNTER — Telehealth: Payer: Self-pay | Admitting: Family Medicine

## 2023-07-22 DIAGNOSIS — M4722 Other spondylosis with radiculopathy, cervical region: Secondary | ICD-10-CM

## 2023-07-22 NOTE — Telephone Encounter (Signed)
 Placed referral for the patient.

## 2023-07-22 NOTE — Telephone Encounter (Signed)
 Copied from CRM [id number]. Topic: Referral - Question >> Jul 22, 2023 10:15 AM Zacyrah J wrote: Reason for CRM: pt needs Dr. Maryanne to fax referral to [facility], he needs it fax over soon as possible due to serve pain in leg. Pt stated that Dr. Maryanne already know about his leg history. Pt would like to be seen soon and don't want to keep going to ER fax- [phone] , number- [phone] address - [address]

## 2023-07-22 NOTE — Telephone Encounter (Signed)
Aware referral has been placed

## 2023-07-25 ENCOUNTER — Ambulatory Visit: Payer: Medicare HMO | Admitting: Family Medicine

## 2023-07-25 ENCOUNTER — Ambulatory Visit (INDEPENDENT_AMBULATORY_CARE_PROVIDER_SITE_OTHER): Payer: Medicare HMO | Admitting: Family Medicine

## 2023-07-25 ENCOUNTER — Encounter: Payer: Self-pay | Admitting: Student

## 2023-07-25 ENCOUNTER — Telehealth: Payer: Self-pay

## 2023-07-25 ENCOUNTER — Other Ambulatory Visit (HOSPITAL_COMMUNITY): Payer: Self-pay

## 2023-07-25 VITALS — BP 136/58 | HR 70 | Ht 66.0 in | Wt 207.0 lb

## 2023-07-25 DIAGNOSIS — M25561 Pain in right knee: Secondary | ICD-10-CM | POA: Diagnosis not present

## 2023-07-25 DIAGNOSIS — M1711 Unilateral primary osteoarthritis, right knee: Secondary | ICD-10-CM | POA: Diagnosis not present

## 2023-07-25 DIAGNOSIS — Z96651 Presence of right artificial knee joint: Secondary | ICD-10-CM

## 2023-07-25 DIAGNOSIS — M62830 Muscle spasm of back: Secondary | ICD-10-CM | POA: Diagnosis not present

## 2023-07-25 DIAGNOSIS — S39012A Strain of muscle, fascia and tendon of lower back, initial encounter: Secondary | ICD-10-CM | POA: Diagnosis not present

## 2023-07-25 MED ORDER — KETOROLAC TROMETHAMINE 60 MG/2ML IM SOLN
60.0000 mg | Freq: Once | INTRAMUSCULAR | Status: AC
  Administered 2023-07-25: 60 mg via INTRAMUSCULAR

## 2023-07-25 NOTE — Progress Notes (Signed)
Pharmacy Medication Assistance Program Note    08/02/2023  Patient ID: Alex [name], male   DOB: 26-Oct-1960, 63 y.o.   MRN: [id number]     07/25/2023  Outreach Medication One  Manufacturer Medication One [employer] Drugs Ozempic  Dose of Ozempic 2MG   Type of Radiographer, therapeutic Assistance  Date Application Submitted to Manufacturer 07/25/2023  Method Application Sent to Manufacturer Online  Patient Assistance Determination Approved  Approval Start Date 08/01/2023  Approval End Date 07/08/2024

## 2023-07-25 NOTE — Telephone Encounter (Signed)
Submitted online. New encounter created.

## 2023-07-25 NOTE — Progress Notes (Signed)
BP (!) 136/58   Pulse 70   Ht 5\' 6"  (1.676 m)   Wt 207 lb (93.9 kg)   SpO2 100%   BMI 33.41 kg/m    Subjective:   Patient ID: Alex [name], male    DOB: [birth year], 63 y.o.   MRN: [id number]  HPI: Alex [name] is a 63 y.o. male presenting on 07/25/2023 for Leg Pain (right)   HPI Right knee and leg pain Patient has some right knee and leg pain 1 from his history of the knee replacement on the right knee but also from some spinal issues and degeneration in the back with neuropathy pain.  Multiple movement increasing.  He is looking to find a new pain management doctor to possibly do injections, it also seems like he possibly tested positive for 6 CBD which came back positive his marijuana on drug screen and they were low and his medications with his previous pain management doctor.  I referral is already been placed for the new doctor.  Relevant past medical, surgical, family and social history reviewed and updated as indicated. Interim medical history since our last visit reviewed. Allergies and medications reviewed and updated.  Review of Systems  Constitutional:  Negative for chills and fever.  Eyes:  Negative for visual disturbance.  Respiratory:  Negative for shortness of breath and wheezing.   Cardiovascular:  Negative for chest pain and leg swelling.  Musculoskeletal:  Positive for arthralgias, gait problem and myalgias. Negative for back pain.  Skin:  Negative for rash.  All other systems reviewed and are negative.   Per HPI unless specifically indicated above   Allergies as of 07/25/2023       Reactions   Morphine And Codeine Nausea Only   Buprenorphine Hcl-naloxone Hcl Nausea Only   Codeine Nausea Only, Other (See Comments)   per pt, "felt weird in my head"        Medication List        Accurate as of July 25, 2023 10:31 AM. If you have any questions, ask your nurse or doctor.          amLODipine 5 MG tablet Commonly known as: NORVASC Take 1  tablet (5 mg total) by mouth daily.   calcium-vitamin D 500-5 MG-MCG tablet Commonly known as: OSCAL WITH D Take 1 tablet by mouth daily with breakfast.   celecoxib 200 MG capsule Commonly known as: CELEBREX Take 1 capsule by mouth daily.   diazepam 5 MG tablet Commonly known as: VALIUM Take 1 tablet (5 mg total) by mouth daily as needed for anxiety.   diclofenac 75 MG EC tablet Commonly known as: VOLTAREN Take 1 tablet (75 mg total) by mouth 2 (two) times daily.   diclofenac Sodium 1 % Gel Commonly known as: Voltaren Apply 2 g topically 4 (four) times daily.   dicyclomine 10 MG capsule Commonly known as: Bentyl Take 1 capsule (10 mg total) by mouth 4 (four) times daily -  before meals and at bedtime.   Farxiga 10 MG Tabs tablet Generic drug: dapagliflozin propanediol Take 1 tablet (10 mg total) by mouth daily.   fluticasone 50 MCG/ACT nasal spray Commonly known as: FLONASE USE 1 SPRAY IN EACH NOSTRIL 2 TIMES A DAY AS NEEDED FOR ALLERGIES   HYDROcodone-acetaminophen 10-325 MG tablet Commonly known as: NORCO Take 1 tablet by mouth daily. Take 4 tablets per day for one week   #28 Take 3 tablets per day for one week   #21 Take 2 tablets  per day for one week   #14 Take 1 tablet per day for one week      #7  then stop Total #70 tablets   ibuprofen 800 MG tablet Commonly known as: ADVIL Take 800 mg by mouth 3 (three) times daily.   ketoconazole 2 % shampoo Commonly known as: NIZORAL TWICE A WEEK FOR UP TO 4 WEEKS, SHAMPOO AS DIRECTED, LEAVE IN FOR 10 MINUTES, THEN RINSE.   lisinopril 40 MG tablet Commonly known as: ZESTRIL Take 1 tablet (40 mg total) by mouth daily.   nabumetone 750 MG tablet Commonly known as: RELAFEN Take 1 tablet (750 mg total) by mouth 2 (two) times daily as needed.   pantoprazole 40 MG tablet Commonly known as: PROTONIX Take 1 tablet (40 mg total) by mouth 2 (two) times daily before a meal.   rosuvastatin 10 MG tablet Commonly known as:  CRESTOR Take 1 tablet (10 mg total) by mouth daily.   Semaglutide (2 MG/DOSE) 8 MG/3ML Sopn Inject 2 mg as directed once a week.   venlafaxine XR 37.5 MG 24 hr capsule Commonly known as: Effexor XR Take 1 capsule (37.5 mg total) by mouth daily.         Objective:   BP (!) 136/58   Pulse 70   Ht 5\' 6"  (1.676 m)   Wt 207 lb (93.9 kg)   SpO2 100%   BMI 33.41 kg/m   Wt Readings from Last 3 Encounters:  07/25/23 207 lb (93.9 kg)  04/19/23 206 lb (93.4 kg)  12/31/22 215 lb (97.5 kg)    Physical Exam Vitals and nursing note reviewed.  Constitutional:      General: He is not in acute distress.    Appearance: He is well-developed. He is not diaphoretic.  Eyes:     General: No scleral icterus.    Conjunctiva/sclera: Conjunctivae normal.  Neck:     Thyroid: No thyromegaly.  Cardiovascular:     Rate and Rhythm: Normal rate and regular rhythm.     Heart sounds: Normal heart sounds. No murmur heard. Pulmonary:     Effort: Pulmonary effort is normal. No respiratory distress.     Breath sounds: Normal breath sounds. No wheezing.  Skin:    General: Skin is warm and dry.     Findings: No rash.  Neurological:     Mental Status: He is alert and oriented to person, place, and time.     Coordination: Coordination normal.  Psychiatric:        Behavior: Behavior normal.       Assessment & Plan:   Problem List Items Addressed This Visit   None Visit Diagnoses       Primary osteoarthritis of right knee    -  Primary   Relevant Orders   Ambulatory referral to Physical Therapy     History of right knee joint replacement           Gave a Toradol injection will refer to physical therapy.  He has a new referral for a new pain management doctor that he is going to go see and how they do for him. Follow up plan: Return if symptoms worsen or fail to improve.  Counseling provided for all of the vaccine components Orders Placed This Encounter  Procedures   Ambulatory  referral to Physical Therapy    Arville Care, MD [facility] 07/25/2023, 10:31 AM

## 2023-07-26 DIAGNOSIS — M5416 Radiculopathy, lumbar region: Secondary | ICD-10-CM | POA: Diagnosis not present

## 2023-08-04 DIAGNOSIS — M62830 Muscle spasm of back: Secondary | ICD-10-CM | POA: Diagnosis not present

## 2023-08-04 DIAGNOSIS — S39012A Strain of muscle, fascia and tendon of lower back, initial encounter: Secondary | ICD-10-CM | POA: Diagnosis not present

## 2023-08-04 DIAGNOSIS — M25561 Pain in right knee: Secondary | ICD-10-CM | POA: Diagnosis not present

## 2023-08-05 ENCOUNTER — Ambulatory Visit: Payer: Medicare HMO

## 2023-08-06 ENCOUNTER — Telehealth: Payer: Self-pay | Admitting: Family Medicine

## 2023-08-06 ENCOUNTER — Other Ambulatory Visit (HOSPITAL_COMMUNITY): Payer: Self-pay

## 2023-08-06 DIAGNOSIS — E1169 Type 2 diabetes mellitus with other specified complication: Secondary | ICD-10-CM

## 2023-08-06 MED ORDER — SEMAGLUTIDE (2 MG/DOSE) 8 MG/3ML ~~LOC~~ SOPN: 2.0000 mg | PEN_INJECTOR | SUBCUTANEOUS | 0 refills | Status: DC

## 2023-08-06 NOTE — Telephone Encounter (Signed)
Pt questioned if his Ozempic has came yet. It has not. He is out of the medication. Pt ok to send one refill to local. He will wait until Friday for shipment and if not received he will pick up at local pharmacy. Informed pt that we would call him once it arrives at the pharmacy.  Alex [name], sending to you to make sure pt does actually receive the Ozempic here in the office.

## 2023-08-06 NOTE — Telephone Encounter (Signed)
Copied from CRM [id number]. Topic: Clinical - Medication Refill >> Aug 06, 2023  8:19 AM Phill Myron wrote: Most Recent Primary Care Visit:  Provider: Arville Care A  Department: [facility]  Visit Type: OFFICE VISIT  Date: 07/25/2023  Medication: Semaglutide, 2 MG/DOSE, 8 MG/3ML SOPN  Has the patient contacted their pharmacy? Yes (Agent: If no, request that the patient contact the pharmacy for the refill. If patient does not wish to contact the pharmacy document the reason why and proceed with request.) (Agent: If yes, when and what did the pharmacy advise?)  Is this the correct pharmacy for this prescription? Yes If no, delete pharmacy and type the correct one.  This is the patient's preferred pharmacy:   [facility] [place], [place] - [address] [place] [address] Phone: [phone] Fax: [phone]  Is the patient out of the medication? Yes  Has the patient been seen for an appointment in the last year OR does the patient have an upcoming appointment? Yes  Can we respond through MyChart? No  I did not change anything, also patient would like a call back from [facility].

## 2023-08-13 ENCOUNTER — Telehealth: Payer: Medicare HMO | Admitting: Nurse Practitioner

## 2023-08-13 ENCOUNTER — Encounter: Payer: Self-pay | Admitting: Nurse Practitioner

## 2023-08-13 ENCOUNTER — Other Ambulatory Visit: Payer: Self-pay | Admitting: Family Medicine

## 2023-08-13 DIAGNOSIS — J069 Acute upper respiratory infection, unspecified: Secondary | ICD-10-CM

## 2023-08-13 NOTE — Telephone Encounter (Signed)
 Pt reports productive cough producing yellowish sputum, fever, chills, sweats, mild HA. Pt notes this began Friday and he would like Tamiflu . Denies SOB, CP. Advised pt appt needed for scripts, pt did not want to be seen in a different provider office or at [facility]. Virtual visit scheduled for today PM. This RN educated pt on home care, new-worsening symptoms, when to call back/seek emergent care. Pt verbalized understanding and agrees to plan.

## 2023-08-13 NOTE — Telephone Encounter (Signed)
Being seen today. Addressed in another encounter. LS

## 2023-08-13 NOTE — Telephone Encounter (Unsigned)
 Copied from CRM [id number]. Topic: Clinical - Medication Refill >> Aug 13, 2023 12:31 PM Carlatta H wrote: Most Recent Primary Care Visit:  Provider: MARYANNE CHEW A  Department: [facility]  Visit Type: OFFICE VISIT  Date: 07/25/2023  Medication: Tamiflu   Has the patient contacted their pharmacy? No (Agent: If no, request that the patient contact the pharmacy for the refill. If patient does not wish to contact the pharmacy document the reason why and proceed with request.) (Agent: If yes, when and what did the pharmacy advise?)  Is this the correct pharmacy for this prescription? Yes If no, delete pharmacy and type the correct one.  This is the patient's preferred pharmacy:   [facility] [place], [place] - [address] [place] [address] Phone: [phone] Fax: [phone]    Has the prescription been filled recently? No  Is the patient out of the medication? Yes  Has the patient been seen for an appointment in the last year OR does the patient have an upcoming appointment? Yes  Can we respond through MyChart? No  Agent: Please be advised that Rx refills may take up to 3 business days. We ask that you follow-up with your pharmacy.

## 2023-08-13 NOTE — Telephone Encounter (Unsigned)
 Copied from CRM [id number]. Topic: Clinical - Medication Refill >> Aug 13, 2023  9:08 AM Graeme ORN wrote: Most Recent Primary Care Visit:  Provider: MARYANNE CHEW A  Department: [facility]  Visit Type: OFFICE VISIT  Date: 07/25/2023  Medication: oseltamivir  (TAMIFLU ) 75 MG capsule - patient tested positive for Flu  Has the patient contacted their pharmacy? No (Agent: If no, request that the patient contact the pharmacy for the refill. If patient does not wish to contact the pharmacy document the reason why and proceed with request.) (Agent: If yes, when and what did the pharmacy advise?)  Is this the correct pharmacy for this prescription? Yes If no, delete pharmacy and type the correct one.  This is the patient's preferred pharmacy:  [facility] [place], [place] - [address] [place] [address] Phone: [phone] Fax: [phone]  [id number] - [place], [place] - [address] [place] [address] Phone: [phone] Fax: [phone]   Has the prescription been filled recently? No  Is the patient out of the medication? Yes  Has the patient been seen for an appointment in the last year OR does the patient have an upcoming appointment? Yes 07/25/2023  Can we respond through MyChart? No  Agent: Please be advised that Rx refills may take up to 3 business days. We ask that you follow-up with your pharmacy.

## 2023-08-13 NOTE — Progress Notes (Addendum)
 Virtual Visit via voice Note Due to COVID-19 pandemic this visit was conducted virtually. This visit type was conducted due to national recommendations for restrictions regarding the COVID-19 Pandemic (e.g. social distancing, sheltering in place) in an effort to limit this patient's exposure and mitigate transmission in our community. All issues noted in this document were discussed and addressed.  A physical exam was not performed with this format.   I connected with Alex [name] on 08/13/2023 at 1350 by name and DOB and verified that I am speaking with the correct person using two identifiers. Alex [name] is currently located at in his car during visit. The provider, Nena Deitra Morton Sebastian, DNP is located in their office at time of visit.  I discussed the limitations, risks, security and privacy concerns of performing an evaluation and management service by virtual visit and the availability of in person appointments. I also discussed with the patient that there may be a patient responsible charge related to this service. The patient expressed understanding and agreed to proceed.  Subjective:  Patient ID: Alex [name], male    DOB: January 02, 1961, 63 y.o.   MRN: [id number]  Chief Complaint:  URI (Start Saturday fever chills on and off slight HA vomiting yesterday)   HPI: [name] is a 63 y.o. male presenting on 08/13/2023 via telehealth for URI (Start Saturday fever chills on and off slight HA vomiting yesterday)  Brendyn is unable to sign  for Video, so I agree for a voice visit  not sure why I have to be on video my PCP always do voice call with me. He is very rude  my PCP is [facility] not sure why I am seeing someone else. Report that his started on Saturday with fever chills on and off, slight HA and vomited on Sunday.  Explains to Olivier that I would like him to go to the clinic to get swab for FLU since he reports a negative Home COVID. Denies SOB, chest pain or syncope  He not  interested in coming to office and wants to be seen F2F by his PCP.   Relevant past medical, surgical, family, and social history reviewed and updated as indicated.  Allergies and medications reviewed and updated.   Past Medical History:  Diagnosis Date   Diabetes mellitus without complication (HCC)    History of kidney stones    removed 5 months ago   Hypertension    PONV (postoperative nausea and vomiting)    AGITATION after anesthesia as well   Sleep apnea    NO CPAP    Past Surgical History:  Procedure Laterality Date   ANTERIOR CERVICAL DECOMP/DISCECTOMY FUSION N/A 03/27/2018   Procedure: ANTERIOR CERVICAL DECOMPRESSION/DISCECTOMY FUSION C3-5;  Surgeon: Burnetta Aures, MD;  Location: [facility] OR;  Service: Orthopedics;  Laterality: N/A;  3.5 hrs   arm surgery Right    x2 involving tendons/ulnar nerve   BACK SURGERY     BIOPSY  10/17/2020   Procedure: BIOPSY;  Surgeon: Cindie Carlin POUR, DO;  Location: [facility] SUITE;  Service: Endoscopy;;   CARPAL TUNNEL RELEASE Right    clavical surgery     ESOPHAGOGASTRODUODENOSCOPY (EGD) WITH PROPOFOL  N/A 10/17/2020   Procedure: ESOPHAGOGASTRODUODENOSCOPY (EGD) WITH PROPOFOL ;  Surgeon: Cindie Carlin POUR, DO;  Location: [facility];  Service: Endoscopy;  Laterality: N/A;  PM   EYE SURGERY Bilateral    glaucoma   HERNIA REPAIR     JOINT REPLACEMENT Right 06/2011   TOTAL KNEE REVISION Right 04/10/2019  Procedure: RIGHT TOTAL KNEE REVISION;  Surgeon: Vernetta Lonni GRADE, MD;  Location: [facility] ORS;  Service: Orthopedics;  Laterality: Right;   TRICEPS TENDON REPAIR      Social History   Socioeconomic History   Marital status: Married    Spouse name: Not on file   Number of children: 2   Years of education: 12   Highest education level: 12th grade  Occupational History   Not on file  Tobacco Use   Smoking status: Never   Smokeless tobacco: Current    Types: Chew  Vaping Use   Vaping status: Never Used  Substance and Sexual  Activity   Alcohol  use: Not Currently    Comment: Quit EtOH 20 years ago   Drug use: No   Sexual activity: Not on file  Other Topics Concern   Not on file  Social History Narrative   Not on file   Social Drivers of Health   Financial Resource Strain: Low Risk  (08/15/2022)   Overall Financial Resource Strain (CARDIA)    Difficulty of Paying Living Expenses: Not hard at all  Food Insecurity: No Food Insecurity (08/15/2022)   Hunger Vital Sign    Worried About Running Out of Food in the Last Year: Never true    Ran Out of Food in the Last Year: Never true  Transportation Needs: No Transportation Needs (08/15/2022)   PRAPARE - Administrator, Civil Service (Medical): No    Lack of Transportation (Non-Medical): No  Physical Activity: Sufficiently Active (08/15/2022)   Exercise Vital Sign    Days of Exercise per Week: 5 days    Minutes of Exercise per Session: 30 min  Stress: No Stress Concern Present (08/15/2022)   [employer] of Occupational Health - Occupational Stress Questionnaire    Feeling of Stress : Not at all  Social Connections: Unknown (12/25/2022)   Received from [place], [place]   Social Connections    How often do you feel lonely or isolated from those around you? (Adult - for ages 28 years and over): Not on file  Intimate Partner Violence: Unknown (12/17/2022)   Received from [facility]   HITS    Physically Hurt: Not on file    Insult or Talk Down To: Not on file    Threaten Physical Harm: Not on file    Scream or Curse: Not on file    Outpatient Encounter Medications as of 08/13/2023  Medication Sig   amLODipine  (NORVASC ) 5 MG tablet Take 1 tablet (5 mg total) by mouth daily.   calcium -vitamin D (OSCAL WITH D) 500-5 MG-MCG tablet Take 1 tablet by mouth daily with breakfast.   celecoxib (CELEBREX) 200 MG capsule Take 1 capsule by mouth daily.   diazepam  (VALIUM ) 5 MG tablet Take 1 tablet (5 mg total) by mouth daily as needed for  anxiety.   diclofenac  (VOLTAREN ) 75 MG EC tablet Take 1 tablet (75 mg total) by mouth 2 (two) times daily.   diclofenac  Sodium (VOLTAREN ) 1 % GEL Apply 2 g topically 4 (four) times daily.   dicyclomine  (BENTYL ) 10 MG capsule Take 1 capsule (10 mg total) by mouth 4 (four) times daily -  before meals and at bedtime.   FARXIGA  10 MG TABS tablet Take 1 tablet (10 mg total) by mouth daily.   fluticasone  (FLONASE ) 50 MCG/ACT nasal spray USE 1 SPRAY IN EACH NOSTRIL 2 TIMES A DAY AS NEEDED FOR ALLERGIES   HYDROcodone -acetaminophen  (NORCO) 10-325 MG tablet Take 1  tablet by mouth daily. Take 4 tablets per day for one week   #28 Take 3 tablets per day for one week   #21 Take 2 tablets per day for one week   #14 Take 1 tablet per day for one week      #7  then stop Total #70 tablets   ibuprofen (ADVIL) 800 MG tablet Take 800 mg by mouth 3 (three) times daily.   ketoconazole  (NIZORAL ) 2 % shampoo TWICE A WEEK FOR UP TO 4 WEEKS, SHAMPOO AS DIRECTED, LEAVE IN FOR 10 MINUTES, THEN RINSE.   lisinopril  (ZESTRIL ) 40 MG tablet Take 1 tablet (40 mg total) by mouth daily.   nabumetone  (RELAFEN ) 750 MG tablet Take 1 tablet (750 mg total) by mouth 2 (two) times daily as needed.   oseltamivir  (TAMIFLU ) 30 MG capsule Take 30 mg by mouth daily.   oseltamivir  (TAMIFLU ) 75 MG capsule Take 75 mg by mouth 2 (two) times daily.   pantoprazole  (PROTONIX ) 40 MG tablet Take 1 tablet (40 mg total) by mouth 2 (two) times daily before a meal.   rosuvastatin  (CRESTOR ) 10 MG tablet Take 1 tablet (10 mg total) by mouth daily.   Semaglutide , 2 MG/DOSE, 8 MG/3ML SOPN Inject 2 mg as directed once a week.   venlafaxine  XR (EFFEXOR  XR) 37.5 MG 24 hr capsule Take 1 capsule (37.5 mg total) by mouth daily.   No facility-administered encounter medications on file as of 08/13/2023.    Allergies  Allergen Reactions   Morphine  And Codeine Nausea Only   Buprenorphine Hcl-Naloxone Hcl Nausea Only   Codeine Nausea Only and Other (See Comments)     per pt, felt weird in my head    Review of Systems  Constitutional:  Positive for chills and fever.       On and off since Saturday  HENT:  Negative for congestion, postnasal drip and sore throat.   Eyes:  Negative for pain.  Respiratory:  Negative for cough, chest tightness and wheezing.   Cardiovascular:  Negative for chest pain and palpitations.  Gastrointestinal:  Positive for vomiting.       One episode of vomiting 08/11/2023  Musculoskeletal:  Negative for back pain and myalgias.  Skin:  Negative for color change and rash.  Neurological:  Positive for headaches. Negative for dizziness, tremors and light-headedness.       Slight HA    Observations/Objective: No vital signs or physical exam, this was a virtual health encounter.  Pt alert and oriented, answers all questions appropriately, and able to speak in full sentences.    Assessment and Plan: Codylee was seen today for uri.  Diagnoses and all orders for this visit:  Viral URI -     Veritor Flu A/B Dmarco Baldus 63 year old male seen today via telehealth/ voice for URI symptoms, no acute distress Client instructed to go to the clinic to get swabbed for influenza since he had negative COVID home test.  Declines he wants to be seen face-to-face by his PCP.  Test ordered in case client change his mind Advised him to increase hydration, rest   Follow Up Instructions: Return if symptoms worsen or fail to improve.    I discussed the assessment and treatment plan with the patient. The patient was provided an opportunity to ask questions and all were answered. The patient agreed with the plan and demonstrated an understanding of the instructions.   The patient was advised to call back or seek an in-person evaluation if  the symptoms worsen or if the condition fails to improve as anticipated.  The above assessment and management plan was discussed with the patient. The patient verbalized understanding of and has agreed to  the management plan. Patient is aware to call the clinic if they develop any new symptoms or if symptoms persist or worsen. Patient is aware when to return to the clinic for a follow-up visit. Patient educated on when it is appropriate to go to the emergency department.    I provided 10 minutes of time during this voice encounter.   Trejon Duford [place] Thompson, DNP [facility] [address] [place], [place] [address] [phone] 08/13/2023

## 2023-08-21 ENCOUNTER — Ambulatory Visit: Payer: Medicare HMO

## 2023-08-21 ENCOUNTER — Other Ambulatory Visit: Payer: Self-pay

## 2023-08-21 VITALS — Ht 66.0 in | Wt 207.0 lb

## 2023-08-21 DIAGNOSIS — Z0000 Encounter for general adult medical examination without abnormal findings: Secondary | ICD-10-CM | POA: Diagnosis not present

## 2023-08-21 DIAGNOSIS — M4722 Other spondylosis with radiculopathy, cervical region: Secondary | ICD-10-CM

## 2023-08-21 DIAGNOSIS — L219 Seborrheic dermatitis, unspecified: Secondary | ICD-10-CM

## 2023-08-21 DIAGNOSIS — Z1211 Encounter for screening for malignant neoplasm of colon: Secondary | ICD-10-CM

## 2023-08-21 NOTE — Telephone Encounter (Signed)
Requesting refills of Nizoral Shampoo and Voltaren tablets

## 2023-08-21 NOTE — Addendum Note (Signed)
Addended by: Kandis Fantasia B on: 08/21/2023 01:27 PM   Modules accepted: Orders

## 2023-08-21 NOTE — Patient Instructions (Signed)
Mr. Alex [name] , Thank you for taking time to come for your Medicare Wellness Visit. I appreciate your ongoing commitment to your health goals. Please review the following plan we discussed and let me know if I can assist you in the future.   Referrals/Orders/Follow-Ups/Clinician Recommendations: Aim for 30 minutes of exercise or brisk walking, 6-8 glasses of water, and 5 servings of fruits and vegetables each day.  This is a list of the screening recommended for you and due dates:  Health Maintenance  Topic Date Due   Zoster (Shingles) Vaccine (1 of 2) Never done   Colon Cancer Screening  Never done   COVID-19 Vaccine (3 - 2024-25 season) 03/10/2023   Eye exam for diabetics  04/20/2023   Yearly kidney health urinalysis for diabetes  06/21/2023   Complete foot exam   09/19/2023   Hemoglobin A1C  10/18/2023   Yearly kidney function blood test for diabetes  04/18/2024   Medicare Annual Wellness Visit  08/20/2024   DTaP/Tdap/Td vaccine (2 - Td or Tdap) 05/25/2031   Flu Shot  Completed   HIV Screening  Completed   HPV Vaccine  Aged Out   Pneumococcal Vaccination  Discontinued   Hepatitis C Screening  Discontinued    Advanced directives: (ACP Link)Information on Advanced Care Planning can be found at [facility] [place] Advance Health Care Directives Advance Health Care Directives ([email or web address]   Next Medicare Annual Wellness Visit scheduled for next year: Yes

## 2023-08-21 NOTE — Progress Notes (Signed)
Subjective:   Alex [name] is a 63 y.o. male who presents for Medicare Annual/Subsequent preventive examination.  Visit Complete: Virtual I connected with  Alease Medina on 08/21/23 by a audio enabled telemedicine application and verified that I am speaking with the correct person using two identifiers.  Patient Location: Home  Provider Location: Home Office  This patient declined Interactive audio and video telecommunications. Therefore the visit was completed with audio only.  I discussed the limitations of evaluation and management by telemedicine. The patient expressed understanding and agreed to proceed.  Vital Signs: Because this visit was a virtual/telehealth visit, some criteria may be missing or patient reported. Any vitals not documented were not able to be obtained and vitals that have been documented are patient reported.  Cardiac Risk Factors include: advanced age (>39men, >21 women);diabetes mellitus;dyslipidemia;hypertension;male gender     Objective:    Today's Vitals   08/21/23 1228  Weight: 207 lb (93.9 kg)  Height: 5\' 6"  (1.676 m)   Body mass index is 33.41 kg/m.     08/21/2023    1:06 PM 08/15/2022    9:58 AM 02/22/2022    8:15 PM 11/29/2021    5:16 PM 07/14/2021    8:38 AM 06/30/2021   12:32 AM 12/21/2020    8:30 AM  Advanced Directives  Does Patient Have a Medical Advance Directive? No No No No No No No  Would patient like information on creating a medical advance directive? Yes (MAU/Ambulatory/Procedural Areas - Information given) No - Patient declined No - Patient declined No - Patient declined No - Patient declined No - Patient declined     Current Medications (verified) Outpatient Encounter Medications as of 08/21/2023  Medication Sig   amLODipine (NORVASC) 5 MG tablet Take 1 tablet (5 mg total) by mouth daily.   calcium-vitamin D (OSCAL WITH D) 500-5 MG-MCG tablet Take 1 tablet by mouth daily with breakfast.   celecoxib (CELEBREX) 200 MG capsule  Take 1 capsule by mouth daily.   diazepam (VALIUM) 5 MG tablet Take 1 tablet (5 mg total) by mouth daily as needed for anxiety.   diclofenac (VOLTAREN) 75 MG EC tablet Take 1 tablet (75 mg total) by mouth 2 (two) times daily.   diclofenac Sodium (VOLTAREN) 1 % GEL Apply 2 g topically 4 (four) times daily.   dicyclomine (BENTYL) 10 MG capsule Take 1 capsule (10 mg total) by mouth 4 (four) times daily -  before meals and at bedtime.   FARXIGA 10 MG TABS tablet Take 1 tablet (10 mg total) by mouth daily.   fluticasone (FLONASE) 50 MCG/ACT nasal spray USE 1 SPRAY IN EACH NOSTRIL 2 TIMES A DAY AS NEEDED FOR ALLERGIES   HYDROcodone-acetaminophen (NORCO) 10-325 MG tablet Take 1 tablet by mouth daily. Take 4 tablets per day for one week   #28 Take 3 tablets per day for one week   #21 Take 2 tablets per day for one week   #14 Take 1 tablet per day for one week      #7  then stop Total #70 tablets   ibuprofen (ADVIL) 800 MG tablet Take 800 mg by mouth 3 (three) times daily.   ketoconazole (NIZORAL) 2 % shampoo TWICE A WEEK FOR UP TO 4 WEEKS, SHAMPOO AS DIRECTED, LEAVE IN FOR 10 MINUTES, THEN RINSE.   lisinopril (ZESTRIL) 40 MG tablet Take 1 tablet (40 mg total) by mouth daily.   nabumetone (RELAFEN) 750 MG tablet Take 1 tablet (750 mg total) by mouth 2 (  two) times daily as needed.   oseltamivir (TAMIFLU) 30 MG capsule Take 30 mg by mouth daily.   oseltamivir (TAMIFLU) 75 MG capsule Take 75 mg by mouth 2 (two) times daily.   pantoprazole (PROTONIX) 40 MG tablet Take 1 tablet (40 mg total) by mouth 2 (two) times daily before a meal.   rosuvastatin (CRESTOR) 10 MG tablet Take 1 tablet (10 mg total) by mouth daily.   Semaglutide, 2 MG/DOSE, 8 MG/3ML SOPN Inject 2 mg as directed once a week.   venlafaxine XR (EFFEXOR XR) 37.5 MG 24 hr capsule Take 1 capsule (37.5 mg total) by mouth daily.   No facility-administered encounter medications on file as of 08/21/2023.    Allergies (verified) Morphine and codeine,  Buprenorphine hcl-naloxone hcl, and Codeine   History: Past Medical History:  Diagnosis Date   Diabetes mellitus without complication (HCC)    History of kidney stones    removed 5 months ago   Hypertension    PONV (postoperative nausea and vomiting)    AGITATION after anesthesia as well   Sleep apnea    NO CPAP   Past Surgical History:  Procedure Laterality Date   ANTERIOR CERVICAL DECOMP/DISCECTOMY FUSION N/A 03/27/2018   Procedure: ANTERIOR CERVICAL DECOMPRESSION/DISCECTOMY FUSION C3-5;  Surgeon: Venita Lick, MD;  Location: [facility];  Service: Orthopedics;  Laterality: N/A;  3.5 hrs   arm surgery Right    x2 involving tendons/ulnar nerve   BACK SURGERY     BIOPSY  10/17/2020   Procedure: BIOPSY;  Surgeon: Lanelle Bal, DO;  Location: [facility];  Service: Endoscopy;;   CARPAL TUNNEL RELEASE Right    clavical surgery     ESOPHAGOGASTRODUODENOSCOPY (EGD) WITH PROPOFOL N/A 10/17/2020   Procedure: ESOPHAGOGASTRODUODENOSCOPY (EGD) WITH PROPOFOL;  Surgeon: Lanelle Bal, DO;  Location: [facility];  Service: Endoscopy;  Laterality: N/A;  PM   EYE SURGERY Bilateral    glaucoma   HERNIA REPAIR     JOINT REPLACEMENT Right 06/2011   TOTAL KNEE REVISION Right 04/10/2019   Procedure: RIGHT TOTAL KNEE REVISION;  Surgeon: Kathryne Hitch, MD;  Location: [facility];  Service: Orthopedics;  Laterality: Right;   TRICEPS TENDON REPAIR     Family History  Problem Relation Age of Onset   Cancer Mother 41       small intestinal   Diabetes Father    Hypertension Father    Heart attack Father    Early death Sister    Brain cancer Brother    Colon cancer Neg Hx    Social History   Socioeconomic History   Marital status: Married    Spouse name: Not on file   Number of children: 2   Years of education: 12   Highest education level: 12th grade  Occupational History   Not on file  Tobacco Use   Smoking status: Never   Smokeless tobacco: Current    Types: Chew   Vaping Use   Vaping status: Never Used  Substance and Sexual Activity   Alcohol use: Not Currently    Comment: Quit EtOH 20 years ago   Drug use: No   Sexual activity: Not on file  Other Topics Concern   Not on file  Social History Narrative   Not on file   Social Drivers of Health   Financial Resource Strain: Low Risk  (08/21/2023)   Overall Financial Resource Strain (CARDIA)    Difficulty of Paying Living Expenses: Not hard at all  Food  Insecurity: No Food Insecurity (08/21/2023)   Hunger Vital Sign    Worried About Running Out of Food in the Last Year: Never true    Ran Out of Food in the Last Year: Never true  Transportation Needs: No Transportation Needs (08/21/2023)   PRAPARE - Administrator, Civil Service (Medical): No    Lack of Transportation (Non-Medical): No  Physical Activity: Sufficiently Active (08/21/2023)   Exercise Vital Sign    Days of Exercise per Week: 5 days    Minutes of Exercise per Session: 30 min  Stress: No Stress Concern Present (08/21/2023)   [employer] of Occupational Health - Occupational Stress Questionnaire    Feeling of Stress : Not at all  Social Connections: Socially Integrated (08/21/2023)   Social Connection and Isolation Panel [NHANES]    Frequency of Communication with Friends and Family: More than three times a week    Frequency of Social Gatherings with Friends and Family: Three times a week    Attends Religious Services: More than 4 times per year    Active Member of Clubs or Organizations: Yes    Attends Engineer, structural: More than 4 times per year    Marital Status: Married    Tobacco Counseling Ready to quit: Not Answered Counseling given: Not Answered   Clinical Intake:  Pre-visit preparation completed: Yes  Pain : No/denies pain     Diabetes: Yes CBG done?: No Did pt. bring in CBG monitor from home?: No  How often do you need to have someone help you when you read instructions,  pamphlets, or other written materials from your doctor or pharmacy?: 1 - Never  Interpreter Needed?: No  Information entered by :: Kandis Fantasia LPN   Activities of Daily Living    08/21/2023    1:00 PM  In your present state of health, do you have any difficulty performing the following activities:  Hearing? 0  Vision? 0  Difficulty concentrating or making decisions? 0  Walking or climbing stairs? 1  Dressing or bathing? 0  Doing errands, shopping? 0  Preparing Food and eating ? N  Using the Toilet? N  In the past six months, have you accidently leaked urine? N  Do you have problems with loss of bowel control? N  Managing your Medications? N  Managing your Finances? N  Housekeeping or managing your Housekeeping? N    Patient Care Team: Dettinger, Elige Radon, MD as PCP - General (Family Medicine) Lanelle Bal, DO as Consulting Physician (Gastroenterology) Danella Maiers, [facility] as Pharmacist (Family Medicine) Fanny Dance, MD as Consulting Physician (Physical Medicine and Rehabilitation) Chrys Racer, MD as Referring Physician (Internal Medicine)  Indicate any recent Medical Services you may have received from other than [facility] providers in the past year (date may be approximate).     Assessment:   This is a routine wellness examination for Jerrick.  Hearing/Vision screen Hearing Screening - Comments:: Denies hearing difficulties   Vision Screening - Comments:: No vision problems; will schedule routine eye exam soon     Goals Addressed   None   Depression Screen    08/21/2023    1:05 PM 07/25/2023   10:02 AM 04/19/2023    1:31 PM 12/31/2022    8:22 AM 09/19/2022   10:17 AM 08/15/2022    9:41 AM 06/20/2022    8:14 AM  PHQ 2/9 Scores  PHQ - 2 Score 2 2 1  0 2 0 1  PHQ- 9 Score  7 7 6 6 10       Fall Risk    08/21/2023    1:06 PM 07/25/2023   10:02 AM 04/19/2023    1:30 PM 12/31/2022    8:22 AM 09/19/2022   10:17 AM  Fall Risk   Falls in the past year? 0  0 0 1 0  Number falls in past yr: 0   0   Injury with Fall? 0   0   Risk for fall due to : No Fall Risks   Impaired balance/gait   Follow up Falls prevention discussed;Education provided;Falls evaluation completed   Falls evaluation completed     MEDICARE RISK AT HOME: Medicare Risk at Home Any stairs in or around the home?: No If so, are there any without handrails?: No Home free of loose throw rugs in walkways, pet beds, electrical cords, etc?: Yes Adequate lighting in your home to reduce risk of falls?: Yes Life alert?: No Use of a cane, walker or w/c?: Yes Grab bars in the bathroom?: Yes Shower chair or bench in shower?: No Elevated toilet seat or a handicapped toilet?: Yes  TIMED UP AND GO:  Was the test performed?  No    Cognitive Function:        08/21/2023    1:07 PM 08/15/2022    9:59 AM 07/14/2021    8:42 AM  6CIT Screen  What Year? 0 points 0 points 0 points  What month? 0 points 0 points 0 points  What time? 0 points 0 points 0 points  Count back from 20 0 points 0 points 0 points  Months in reverse 0 points 0 points 0 points  Repeat phrase 0 points 0 points 0 points  Total Score 0 points 0 points 0 points    Immunizations Immunization History  Administered Date(s) Administered   Influenza, Seasonal, Injecte, Preservative Fre 04/19/2023   Influenza,inj,Quad PF,6+ Mos 05/28/2018, 03/30/2019, 04/06/2020, 03/24/2021, 05/23/2022   Influenza-Unspecified 05/21/2017   Janssen (J&J) SARS-COV-2 Vaccination 11/09/2019   Moderna Sars-Covid-2 Vaccination 07/19/2020   Pneumococcal Polysaccharide-23 02/21/2018   Tdap 05/24/2021    TDAP status: Up to date  Flu Vaccine status: Up to date  Pneumococcal vaccine status: Up to date  Covid-19 vaccine status: Information provided on how to obtain vaccines.   Qualifies for Shingles Vaccine? Yes   Zostavax completed No   Shingrix Completed?: No.    Education has been provided regarding the importance of this vaccine.  Patient has been advised to call insurance company to determine out of pocket expense if they have not yet received this vaccine. Advised may also receive vaccine at local pharmacy or Health Dept. Verbalized acceptance and understanding.  Screening Tests Health Maintenance  Topic Date Due   Zoster Vaccines- Shingrix (1 of 2) Never done   Colonoscopy  Never done   COVID-19 Vaccine (3 - 2024-25 season) 03/10/2023   OPHTHALMOLOGY EXAM  04/20/2023   Diabetic kidney evaluation - Urine ACR  06/21/2023   FOOT EXAM  09/19/2023   HEMOGLOBIN A1C  10/18/2023   Diabetic kidney evaluation - eGFR measurement  04/18/2024   Medicare Annual Wellness (AWV)  08/20/2024   DTaP/Tdap/Td (2 - Td or Tdap) 05/25/2031   INFLUENZA VACCINE  Completed   HIV Screening  Completed   HPV VACCINES  Aged Out   Pneumococcal Vaccine 36-65 Years old  Discontinued   Hepatitis C Screening  Discontinued    Health Maintenance  Health Maintenance Due  Topic Date Due   Zoster Vaccines-  Shingrix (1 of 2) Never done   Colonoscopy  Never done   COVID-19 Vaccine (3 - 2024-25 season) 03/10/2023   OPHTHALMOLOGY EXAM  04/20/2023   Diabetic kidney evaluation - Urine ACR  06/21/2023    Colorectal cancer screening:  Cologuard ordered today  Lung Cancer Screening: (Low Dose CT Chest recommended if Age 68-80 years, 20 pack-year currently smoking OR have quit w/in 15years.) does not qualify.   Lung Cancer Screening Referral: n/a  Additional Screening:  Hepatitis C Screening: does qualify; Completed 09/05/21  Vision Screening: Recommended annual ophthalmology exams for early detection of glaucoma and other disorders of the eye. Is the patient up to date with their annual eye exam?  No  Who is the provider or what is the name of the office in which the patient attends annual eye exams? none If pt is not established with a provider, would they like to be referred to a provider to establish care? No .   Dental Screening:  Recommended annual dental exams for proper oral hygiene  Diabetic Foot Exam: Diabetic Foot Exam: Completed 09/19/22  Community Resource Referral / Chronic Care Management: CRR required this visit?  No   CCM required this visit?  No     Plan:     I have personally reviewed and noted the following in the patient's chart:   Medical and social history Use of alcohol, tobacco or illicit drugs  Current medications and supplements including opioid prescriptions. Patient is not currently taking opioid prescriptions. Functional ability and status Nutritional status Physical activity Advanced directives List of other physicians Hospitalizations, surgeries, and ER visits in previous 12 months Vitals Screenings to include cognitive, depression, and falls Referrals and appointments  In addition, I have reviewed and discussed with patient certain preventive protocols, quality metrics, and best practice recommendations. A written personalized care plan for preventive services as well as general preventive health recommendations were provided to patient.     Kandis Fantasia [place], [place]   2/[id number]/6213   After Visit Summary: (Mail) Due to this being a telephonic visit, the after visit summary with patients personalized plan was offered to patient via mail   Nurse Notes: Patient is requesting refills

## 2023-08-22 MED ORDER — KETOCONAZOLE 2 % EX SHAM: MEDICATED_SHAMPOO | CUTANEOUS | 1 refills | Status: DC

## 2023-08-22 MED ORDER — DICLOFENAC SODIUM 75 MG PO TBEC: 75.0000 mg | DELAYED_RELEASE_TABLET | Freq: Two times a day (BID) | ORAL | 1 refills | Status: DC

## 2023-08-30 DIAGNOSIS — F1123 Opioid dependence with withdrawal: Secondary | ICD-10-CM | POA: Diagnosis not present

## 2023-08-31 DIAGNOSIS — F1123 Opioid dependence with withdrawal: Secondary | ICD-10-CM | POA: Diagnosis not present

## 2023-09-04 DIAGNOSIS — M25561 Pain in right knee: Secondary | ICD-10-CM | POA: Diagnosis not present

## 2023-09-04 DIAGNOSIS — M62830 Muscle spasm of back: Secondary | ICD-10-CM | POA: Diagnosis not present

## 2023-09-04 DIAGNOSIS — S39012A Strain of muscle, fascia and tendon of lower back, initial encounter: Secondary | ICD-10-CM | POA: Diagnosis not present

## 2023-09-05 ENCOUNTER — Telehealth: Payer: Self-pay | Admitting: Pharmacist

## 2023-09-05 NOTE — Telephone Encounter (Signed)
 Your ozempic via [employer] patient assistance program is here at your doctor's office for pick up. Please pick up within 5 business days if you are able due to our limited storage space.  Patient notified

## 2023-10-04 DIAGNOSIS — S39012A Strain of muscle, fascia and tendon of lower back, initial encounter: Secondary | ICD-10-CM | POA: Diagnosis not present

## 2023-10-04 DIAGNOSIS — M62830 Muscle spasm of back: Secondary | ICD-10-CM | POA: Diagnosis not present

## 2023-10-04 DIAGNOSIS — M25561 Pain in right knee: Secondary | ICD-10-CM | POA: Diagnosis not present

## 2023-10-09 ENCOUNTER — Other Ambulatory Visit: Payer: Self-pay | Admitting: Family Medicine

## 2023-10-11 ENCOUNTER — Other Ambulatory Visit: Payer: Self-pay | Admitting: Family Medicine

## 2023-10-14 ENCOUNTER — Other Ambulatory Visit: Payer: Self-pay | Admitting: Family Medicine

## 2023-10-14 DIAGNOSIS — I152 Hypertension secondary to endocrine disorders: Secondary | ICD-10-CM

## 2023-10-14 NOTE — Telephone Encounter (Signed)
 Copied from CRM [id number]. Topic: Clinical - Medication Refill >> Oct 14, 2023  2:19 PM Yolanda T wrote: Most Recent Primary Care Visit:   Medication: amLODipine (NORVASC) 5 MG tablet  Has the patient contacted their pharmacy? No  Is this the correct pharmacy for this prescription? Yes If no, delete pharmacy and type the correct one.  This is the patient's preferred pharmacy:  [facility] [place], [place] - [address] [place] [address] Phone: [phone] Fax: [phone]   Has the prescription been filled recently? Yes  Is the patient out of the medication? Yes  Has the patient been seen for an appointment in the last year OR does the patient have an upcoming appointment? Yes  Can we respond through MyChart? No  Agent: Please be advised that Rx refills may take up to 3 business days. We ask that you follow-up with your pharmacy.

## 2023-10-15 ENCOUNTER — Other Ambulatory Visit: Payer: Self-pay | Admitting: Family Medicine

## 2023-10-15 MED ORDER — AMLODIPINE BESYLATE 5 MG PO TABS: 5.0000 mg | ORAL_TABLET | Freq: Every day | ORAL | 3 refills | Status: AC

## 2023-10-15 NOTE — Telephone Encounter (Signed)
 Copied from CRM [id number]. Topic: Clinical - Medication Refill >> Oct 15, 2023  8:38 AM Izetta Dakin wrote: Most Recent Primary Care Visit:   Medication: fluticasone (FLONASE) 50 MCG/ACT nasal spray  Has the patient contacted their pharmacy? No (Agent: If no, request that the patient contact the pharmacy for the refill. If patient does not wish to contact the pharmacy document the reason why and proceed with request.) (Agent: If yes, when and what did the pharmacy advise?)  Is this the correct pharmacy for this prescription? Yes If no, delete pharmacy and type the correct one.  This is the patient's preferred pharmacy:  [facility] [place], [place] - [address] [place] [address] Phone: [phone] Fax: [phone]    Has the prescription been filled recently? Yes  Is the patient out of the medication? Yes  Has the patient been seen for an appointment in the last year OR does the patient have an upcoming appointment? Yes  Can we respond through MyChart? No  Agent: Please be advised that Rx refills may take up to 3 business days. We ask that you follow-up with your pharmacy.

## 2023-10-22 ENCOUNTER — Telehealth: Payer: Self-pay | Admitting: Family Medicine

## 2023-11-04 ENCOUNTER — Telehealth: Payer: Self-pay | Admitting: Family Medicine

## 2023-11-04 DIAGNOSIS — M25561 Pain in right knee: Secondary | ICD-10-CM | POA: Diagnosis not present

## 2023-11-04 DIAGNOSIS — S39012A Strain of muscle, fascia and tendon of lower back, initial encounter: Secondary | ICD-10-CM | POA: Diagnosis not present

## 2023-11-04 DIAGNOSIS — M62830 Muscle spasm of back: Secondary | ICD-10-CM | POA: Diagnosis not present

## 2023-11-04 NOTE — Telephone Encounter (Signed)
 Number not in service.

## 2023-11-04 NOTE — Telephone Encounter (Signed)
 Copied from CRM [id number]. Topic: Clinical - Medication Question >> Nov 04, 2023  2:28 PM Georgeann Kindred wrote: Reason for CRM: Patient requesting a call from Dr. Steen Eden regarding knee and lumbar. Please contact patient at [phone].

## 2023-11-05 DIAGNOSIS — Z794 Long term (current) use of insulin: Secondary | ICD-10-CM | POA: Diagnosis not present

## 2023-11-05 DIAGNOSIS — G894 Chronic pain syndrome: Secondary | ICD-10-CM | POA: Diagnosis not present

## 2023-11-05 DIAGNOSIS — E119 Type 2 diabetes mellitus without complications: Secondary | ICD-10-CM | POA: Diagnosis not present

## 2023-11-05 DIAGNOSIS — G8929 Other chronic pain: Secondary | ICD-10-CM | POA: Diagnosis not present

## 2023-11-05 NOTE — Telephone Encounter (Signed)
 Number not in service.

## 2023-11-18 ENCOUNTER — Ambulatory Visit: Admitting: Family Medicine

## 2023-11-21 ENCOUNTER — Ambulatory Visit: Admitting: Family Medicine

## 2023-12-04 DIAGNOSIS — M25561 Pain in right knee: Secondary | ICD-10-CM | POA: Diagnosis not present

## 2023-12-04 DIAGNOSIS — S39012A Strain of muscle, fascia and tendon of lower back, initial encounter: Secondary | ICD-10-CM | POA: Diagnosis not present

## 2023-12-04 DIAGNOSIS — M62830 Muscle spasm of back: Secondary | ICD-10-CM | POA: Diagnosis not present

## 2023-12-11 DIAGNOSIS — M5416 Radiculopathy, lumbar region: Secondary | ICD-10-CM | POA: Diagnosis not present

## 2023-12-12 ENCOUNTER — Telehealth: Payer: Self-pay | Admitting: Pharmacist

## 2023-12-12 NOTE — Telephone Encounter (Signed)
   Patient enrolled in the [employer] patient assistance program for Ozempic .  Phone number on file did not work (states not in service).  Will place medication in lab fridge for pick up if patient calls.  Zae Kirtz Dattero Danthony Kendrix, PharmD, BCACP, CPP Clinical Pharmacist, [facility]

## 2023-12-18 ENCOUNTER — Telehealth: Payer: Self-pay

## 2023-12-18 NOTE — Telephone Encounter (Signed)
 Copied from CRM [id number]. Topic: Clinical - Prescription Issue >> Dec 18, 2023 11:18 AM Crispin Dolphin wrote: Reason for CRM: Patient calls. States he usually works with Julie for medication assistance for Ozempic . Will be due again in two weeks and wanted to get the process started. Goes through patient assistance. Would like a call back on new phone once completed. Thank You

## 2023-12-19 DIAGNOSIS — Z96651 Presence of right artificial knee joint: Secondary | ICD-10-CM | POA: Diagnosis not present

## 2023-12-19 DIAGNOSIS — M25561 Pain in right knee: Secondary | ICD-10-CM | POA: Diagnosis not present

## 2023-12-19 DIAGNOSIS — G8929 Other chronic pain: Secondary | ICD-10-CM | POA: Diagnosis not present

## 2023-12-19 NOTE — Telephone Encounter (Signed)
 Tried patient again; left VM stating medication was available for pick up

## 2023-12-25 ENCOUNTER — Telehealth: Payer: Self-pay | Admitting: Family Medicine

## 2024-01-04 DIAGNOSIS — M25561 Pain in right knee: Secondary | ICD-10-CM | POA: Diagnosis not present

## 2024-01-04 DIAGNOSIS — S39012A Strain of muscle, fascia and tendon of lower back, initial encounter: Secondary | ICD-10-CM | POA: Diagnosis not present

## 2024-01-04 DIAGNOSIS — M62830 Muscle spasm of back: Secondary | ICD-10-CM | POA: Diagnosis not present

## 2024-01-08 ENCOUNTER — Telehealth: Payer: Self-pay | Admitting: Orthopaedic Surgery

## 2024-01-08 NOTE — Telephone Encounter (Signed)
 Fx [phone]. Asking for letter tht pt needs no pre meds before dental procedures Please fax above number

## 2024-01-09 NOTE — Telephone Encounter (Signed)
 Letter faxed.

## 2024-01-13 DIAGNOSIS — M25561 Pain in right knee: Secondary | ICD-10-CM | POA: Diagnosis not present

## 2024-01-13 DIAGNOSIS — Z96651 Presence of right artificial knee joint: Secondary | ICD-10-CM | POA: Diagnosis not present

## 2024-01-13 DIAGNOSIS — G8929 Other chronic pain: Secondary | ICD-10-CM | POA: Diagnosis not present

## 2024-01-16 DIAGNOSIS — R69 Illness, unspecified: Secondary | ICD-10-CM | POA: Diagnosis not present

## 2024-01-17 ENCOUNTER — Telehealth: Payer: Self-pay | Admitting: Family Medicine

## 2024-01-17 NOTE — Telephone Encounter (Signed)
 Patient returning call, he stated that he never got his results from the last diabetic eye exam he did. I contacted [employer] and they stated that his last one was in 2023 and was not at this office. Patient hung up before I can return to call to let him know. Attempted to call back with no answer.

## 2024-02-03 DIAGNOSIS — M25561 Pain in right knee: Secondary | ICD-10-CM | POA: Diagnosis not present

## 2024-02-03 DIAGNOSIS — S39012A Strain of muscle, fascia and tendon of lower back, initial encounter: Secondary | ICD-10-CM | POA: Diagnosis not present

## 2024-02-03 DIAGNOSIS — M62830 Muscle spasm of back: Secondary | ICD-10-CM | POA: Diagnosis not present

## 2024-02-12 ENCOUNTER — Other Ambulatory Visit: Payer: Self-pay | Admitting: Family Medicine

## 2024-02-12 DIAGNOSIS — L219 Seborrheic dermatitis, unspecified: Secondary | ICD-10-CM

## 2024-02-24 DIAGNOSIS — Z96651 Presence of right artificial knee joint: Secondary | ICD-10-CM | POA: Diagnosis not present

## 2024-02-27 ENCOUNTER — Telehealth: Payer: Self-pay

## 2024-02-27 NOTE — Telephone Encounter (Signed)
 [employer] refill form for OZEMPIC  emailed to [facility] for completion.

## 2024-03-04 NOTE — Telephone Encounter (Signed)
 Faxed Ozempic  2mg  refill to [employer]

## 2024-03-05 DIAGNOSIS — S39012A Strain of muscle, fascia and tendon of lower back, initial encounter: Secondary | ICD-10-CM | POA: Diagnosis not present

## 2024-03-05 DIAGNOSIS — M25561 Pain in right knee: Secondary | ICD-10-CM | POA: Diagnosis not present

## 2024-03-05 DIAGNOSIS — M62830 Muscle spasm of back: Secondary | ICD-10-CM | POA: Diagnosis not present

## 2024-03-12 DIAGNOSIS — M5416 Radiculopathy, lumbar region: Secondary | ICD-10-CM | POA: Diagnosis not present

## 2024-03-17 DIAGNOSIS — M5416 Radiculopathy, lumbar region: Secondary | ICD-10-CM | POA: Diagnosis not present

## 2024-03-23 ENCOUNTER — Telehealth: Payer: Self-pay | Admitting: Pharmacist

## 2024-03-23 DIAGNOSIS — E669 Obesity, unspecified: Secondary | ICD-10-CM

## 2024-03-23 NOTE — Telephone Encounter (Signed)
 Patient needs to see PCP to stay enrolled with patient assistance (Ozempic ); last PCP appts were cancelled. A1c is due as well Last A1c 04/19/23 A1C ordered, but will likely need all labs   Please schedule patient with PCP Thank you!

## 2024-03-23 NOTE — Telephone Encounter (Signed)
 SCHEDULED FIRST AVAILABLE WITH DR DETTINGER/ 10/13

## 2024-03-31 DIAGNOSIS — Z96651 Presence of right artificial knee joint: Secondary | ICD-10-CM | POA: Diagnosis not present

## 2024-03-31 DIAGNOSIS — T84032D Mechanical loosening of internal right knee prosthetic joint, subsequent encounter: Secondary | ICD-10-CM | POA: Diagnosis not present

## 2024-04-05 DIAGNOSIS — M25561 Pain in right knee: Secondary | ICD-10-CM | POA: Diagnosis not present

## 2024-04-05 DIAGNOSIS — S39012A Strain of muscle, fascia and tendon of lower back, initial encounter: Secondary | ICD-10-CM | POA: Diagnosis not present

## 2024-04-05 DIAGNOSIS — M62830 Muscle spasm of back: Secondary | ICD-10-CM | POA: Diagnosis not present

## 2024-04-20 ENCOUNTER — Encounter: Payer: Self-pay | Admitting: Family Medicine

## 2024-04-20 ENCOUNTER — Telehealth: Payer: Self-pay | Admitting: Family Medicine

## 2024-04-20 ENCOUNTER — Ambulatory Visit: Admitting: Family Medicine

## 2024-04-20 VITALS — BP 132/84 | HR 64 | Temp 98.0°F | Ht 66.0 in | Wt 210.0 lb

## 2024-04-20 DIAGNOSIS — M792 Neuralgia and neuritis, unspecified: Secondary | ICD-10-CM

## 2024-04-20 DIAGNOSIS — I152 Hypertension secondary to endocrine disorders: Secondary | ICD-10-CM | POA: Diagnosis not present

## 2024-04-20 DIAGNOSIS — Z23 Encounter for immunization: Secondary | ICD-10-CM | POA: Diagnosis not present

## 2024-04-20 DIAGNOSIS — M4722 Other spondylosis with radiculopathy, cervical region: Secondary | ICD-10-CM | POA: Diagnosis not present

## 2024-04-20 DIAGNOSIS — E1169 Type 2 diabetes mellitus with other specified complication: Secondary | ICD-10-CM | POA: Diagnosis not present

## 2024-04-20 DIAGNOSIS — E119 Type 2 diabetes mellitus without complications: Secondary | ICD-10-CM | POA: Diagnosis not present

## 2024-04-20 DIAGNOSIS — E669 Obesity, unspecified: Secondary | ICD-10-CM

## 2024-04-20 DIAGNOSIS — F17229 Nicotine dependence, chewing tobacco, with unspecified nicotine-induced disorders: Secondary | ICD-10-CM

## 2024-04-20 DIAGNOSIS — T84032D Mechanical loosening of internal right knee prosthetic joint, subsequent encounter: Secondary | ICD-10-CM

## 2024-04-20 DIAGNOSIS — Z7984 Long term (current) use of oral hypoglycemic drugs: Secondary | ICD-10-CM

## 2024-04-20 DIAGNOSIS — E1159 Type 2 diabetes mellitus with other circulatory complications: Secondary | ICD-10-CM | POA: Diagnosis not present

## 2024-04-20 DIAGNOSIS — E785 Hyperlipidemia, unspecified: Secondary | ICD-10-CM

## 2024-04-20 DIAGNOSIS — E1142 Type 2 diabetes mellitus with diabetic polyneuropathy: Secondary | ICD-10-CM

## 2024-04-20 LAB — BAYER DCA HB A1C WAIVED: HB A1C (BAYER DCA - WAIVED): 5.4 % (ref 4.8–5.6)

## 2024-04-20 MED ORDER — DICYCLOMINE HCL 10 MG PO CAPS: 10.0000 mg | ORAL_CAPSULE | Freq: Three times a day (TID) | ORAL | 2 refills | Status: AC

## 2024-04-20 MED ORDER — DICLOFENAC SODIUM 75 MG PO TBEC
75.0000 mg | DELAYED_RELEASE_TABLET | Freq: Two times a day (BID) | ORAL | 1 refills | Status: AC
Start: 2024-04-20 — End: ?

## 2024-04-20 MED ORDER — FARXIGA 10 MG PO TABS: 10.0000 mg | ORAL_TABLET | Freq: Every day | ORAL | 3 refills | Status: AC

## 2024-04-20 MED ORDER — PANTOPRAZOLE SODIUM 40 MG PO TBEC: 40.0000 mg | DELAYED_RELEASE_TABLET | Freq: Two times a day (BID) | ORAL | 3 refills | Status: AC

## 2024-04-20 MED ORDER — BUPROPION HCL ER (XL) 150 MG PO TB24: 150.0000 mg | ORAL_TABLET | Freq: Every day | ORAL | 2 refills | Status: AC

## 2024-04-20 MED ORDER — DICLOFENAC SODIUM 1 % EX GEL: 2.0000 g | Freq: Four times a day (QID) | CUTANEOUS | 1 refills | Status: AC

## 2024-04-20 MED ORDER — OYSTER SHELL CALCIUM/D3 500-5 MG-MCG PO TABS: 1.0000 | ORAL_TABLET | Freq: Every day | ORAL | 3 refills | Status: AC

## 2024-04-20 MED ORDER — ROSUVASTATIN CALCIUM 10 MG PO TABS: 10.0000 mg | ORAL_TABLET | Freq: Every day | ORAL | 3 refills | Status: AC

## 2024-04-20 MED ORDER — LISINOPRIL 40 MG PO TABS: 40.0000 mg | ORAL_TABLET | Freq: Every day | ORAL | 3 refills | Status: AC

## 2024-04-20 NOTE — Telephone Encounter (Signed)
 Duplicate message

## 2024-04-20 NOTE — Progress Notes (Signed)
 BP 132/84   Pulse 64   Temp 98 F (36.7 C) (Temporal)   Ht 5' 6 (1.676 m)   Wt 210 lb (95.3 kg)   SpO2 97%   BMI 33.89 kg/m    Subjective:   Patient ID: Alex [name], male    DOB: October 08, 1960, 63 y.o.   MRN: [id number]  HPI: Alex [name] is a 63 y.o. male presenting on 04/20/2024 for Medical Management of Chronic Issues   Discussed the use of AI scribe software for clinical note transcription with the patient, who gave verbal consent to proceed.  History of Present Illness   Alex [name] is a 63 year old male with a history of knee replacement surgery and diabetes who presents for management of nicotine  cessation and pain related to knee surgery.  Nicotine  dependence and cessation - Struggling to eliminate nicotine  prior to scheduled knee replacement surgery on November 4th - Difficulty quitting nicotine  and experiencing cravings - Previously attempted cessation by abstaining from purchasing chewing tobacco for over a week and using a nicotine -free tea substitute - Desires permanent cessation after surgery  Knee pain and preoperative management - Experiencing knee pain with occasional radiation into the buttock and hamstring - History of prior knee replacement surgery; scheduled for another knee replacement - Previous pain management with morphine  was ineffective - Currently using Voltaren  gel and a TENS unit for pain control - Uses pain medication, which sometimes causes nausea and vomiting, as occurred before attending church recently  Perioperative risk concerns - Concerned about increased risk of infection post-surgery due to diabetes and nicotine  use - Worried about impaired healing related to these risk factors  Diabetes mellitus - History of diabetes mellitus - Glycemic control is well-managed with a recent A1c of 5.4          Relevant past medical, surgical, family and social history reviewed and updated as indicated. Interim medical history since our last  visit reviewed. Allergies and medications reviewed and updated.  Review of Systems  Constitutional:  Negative for chills and fever.  Respiratory:  Negative for shortness of breath and wheezing.   Cardiovascular:  Negative for chest pain and leg swelling.  Musculoskeletal:  Positive for arthralgias and myalgias. Negative for back pain and gait problem.  Skin:  Negative for rash.  All other systems reviewed and are negative.   Per HPI unless specifically indicated above   Allergies as of 04/20/2024       Reactions   Morphine  And Codeine Nausea Only   Buprenorphine Hcl-naloxone Hcl Nausea Only   Codeine Nausea Only, Other (See Comments)   per pt, felt weird in my head        Medication List        Accurate as of April 20, 2024 11:45 AM. If you have any questions, ask your nurse or doctor.          STOP taking these medications    oseltamivir  30 MG capsule Commonly known as: TAMIFLU  Stopped by: Alex [name]   oseltamivir  75 MG capsule Commonly known as: TAMIFLU  Stopped by: Alex [name]       TAKE these medications    amLODipine  5 MG tablet Commonly known as: NORVASC  Take 1 tablet (5 mg total) by mouth daily.   buPROPion 150 MG 24 hr tablet Commonly known as: Wellbutrin XL Take 1 tablet (150 mg total) by mouth daily. Started by: Alex [name]   calcium -vitamin D 500-5 MG-MCG tablet Commonly known as: OSCAL WITH D Take 1  tablet by mouth daily with breakfast.   diazepam  5 MG tablet Commonly known as: VALIUM  Take 1 tablet (5 mg total) by mouth daily as needed for anxiety.   diclofenac  75 MG EC tablet Commonly known as: VOLTAREN  Take 1 tablet (75 mg total) by mouth 2 (two) times daily.   diclofenac  Sodium 1 % Gel Commonly known as: Voltaren  Apply 2 g topically 4 (four) times daily.   dicyclomine  10 MG capsule Commonly known as: Bentyl  Take 1 capsule (10 mg total) by mouth 4 (four) times daily -  before meals and at bedtime.    Farxiga  10 MG Tabs tablet Generic drug: dapagliflozin propanediol  Take 1 tablet (10 mg total) by mouth daily.   fluticasone  50 MCG/ACT nasal spray Commonly known as: FLONASE  USE 1 SPRAY IN EACH NOSTRIL 2 TIMES A DAY AS NEEDED FOR ALLERGIES   HYDROcodone -acetaminophen  10-325 MG tablet Commonly known as: NORCO Take 1 tablet by mouth daily. Take 4 tablets per day for one week   #28 Take 3 tablets per day for one week   #21 Take 2 tablets per day for one week   #14 Take 1 tablet per day for one week      #7  then stop Total #70 tablets   ketoconazole  2 % shampoo Commonly known as: NIZORAL  SHAMPOO AS DIRECTED 2 TIMES A WEEK, LEAVING ON FOR 10 MINUTES THEN RINSING   lisinopril  40 MG tablet Commonly known as: ZESTRIL  Take 1 tablet (40 mg total) by mouth daily.   nabumetone  750 MG tablet Commonly known as: RELAFEN  Take 1 tablet (750 mg total) by mouth 2 (two) times daily as needed.   pantoprazole  40 MG tablet Commonly known as: PROTONIX  Take 1 tablet (40 mg total) by mouth 2 (two) times daily before a meal.   rosuvastatin  10 MG tablet Commonly known as: CRESTOR  Take 1 tablet (10 mg total) by mouth daily.   Semaglutide  (2 MG/DOSE) 8 MG/3ML Sopn Inject 2 mg as directed once a week.   venlafaxine  XR 37.5 MG 24 hr capsule Commonly known as: Effexor  XR Take 1 capsule (37.5 mg total) by mouth daily.         Objective:   BP 132/84   Pulse 64   Temp 98 F (36.7 C) (Temporal)   Ht 5' 6 (1.676 m)   Wt 210 lb (95.3 kg)   SpO2 97%   BMI 33.89 kg/m   Wt Readings from Last 3 Encounters:  04/20/24 210 lb (95.3 kg)  08/21/23 207 lb (93.9 kg)  07/25/23 207 lb (93.9 kg)    Physical Exam Vitals and nursing note reviewed.  Constitutional:      Appearance: Normal appearance. He is obese.  Neurological:     Mental Status: He is alert.    Physical Exam   CHEST: Lungs clear to auscultation bilaterally. CARDIOVASCULAR: Heart regular rate and rhythm.         Assessment &  Plan:   Problem List Items Addressed This Visit       Cardiovascular and Mediastinum   Hypertension associated with diabetes (HCC) - Primary   Relevant Medications   FARXIGA  10 MG TABS tablet   lisinopril  (ZESTRIL ) 40 MG tablet   rosuvastatin  (CRESTOR ) 10 MG tablet   Other Relevant Orders   CBC with Differential/Platelet   CMP14+EGFR   Lipid panel   Bayer DCA Hb A1c Waived   PSA, total and free   Microalbumin / creatinine urine ratio     Endocrine   Type  2 diabetes mellitus in patient with obesity (HCC)   Relevant Medications   FARXIGA  10 MG TABS tablet   lisinopril  (ZESTRIL ) 40 MG tablet   rosuvastatin  (CRESTOR ) 10 MG tablet   Other Relevant Orders   Microalbumin / creatinine urine ratio   Hyperlipidemia associated with type 2 diabetes mellitus (HCC)   Relevant Medications   FARXIGA  10 MG TABS tablet   lisinopril  (ZESTRIL ) 40 MG tablet   rosuvastatin  (CRESTOR ) 10 MG tablet   Other Relevant Orders   CBC with Differential/Platelet   CMP14+EGFR   Lipid panel   Bayer DCA Hb A1c Waived   PSA, total and free     Musculoskeletal and Integument   Cervical spine degeneration   Relevant Medications   calcium -vitamin D (OSCAL WITH D) 500-5 MG-MCG tablet   diclofenac  (VOLTAREN ) 75 MG EC tablet   diclofenac  Sodium (VOLTAREN ) 1 % GEL   Loose right total knee arthroplasty     Other   Neuropathic pain   Morbid obesity (HCC)   Relevant Medications   FARXIGA  10 MG TABS tablet   Other Visit Diagnoses       Type 2 diabetes mellitus with diabetic polyneuropathy, without long-term current use of insulin  (HCC)       Relevant Medications   FARXIGA  10 MG TABS tablet   lisinopril  (ZESTRIL ) 40 MG tablet   rosuvastatin  (CRESTOR ) 10 MG tablet   buPROPion (WELLBUTRIN XL) 150 MG 24 hr tablet   Other Relevant Orders   Microalbumin / creatinine urine ratio     Encounter for immunization         Encounter for immunization       Relevant Orders   Flu vaccine trivalent PF, 6mos and  older(Flulaval,Afluria,Fluarix,Fluzone) (Completed)     Chewing tobacco nicotine  dependence with nicotine -induced disorder       Relevant Medications   buPROPion (WELLBUTRIN XL) 150 MG 24 hr tablet          Chronic right knee pain status post multiple knee replacements Chronic pain persists post multiple knee replacements with scheduled surgery. High risk for infection and complications, including potential amputation if infection involves hardware. - Taper pain management regimen as Wellbutrin is initiated to assist with cravings and withdrawal symptoms. - Advise on nicotine  cessation to reduce surgical risks. - Discuss potential need for amputation if infection occurs post-surgery.  Nicotine  dependence Nicotine  dependence requires cessation by November 4th for knee surgery. Wellbutrin (bupropion) discussed as cessation aid. Emphasized avoiding nicotine  to reduce surgical risks. - Prescribe Wellbutrin (bupropion) once daily for nicotine  cravings. - Advise to avoid nicotine  products, including patches, due to upcoming surgery. - Instruct to verify that any alternative products, such as tea, are nicotine -free.  Type 2 diabetes mellitus Type 2 diabetes mellitus with A1c of 5.4, indicating good glycemic control. Discussed impact on healing, particularly for upcoming knee surgery. - Continue current diabetes management plan.          Follow up plan: Return in about 6 months (around 10/19/2024), or if symptoms worsen or fail to improve, for Diabetes and hypertension recheck.  Counseling provided for all of the vaccine components Orders Placed This Encounter  Procedures   Flu vaccine trivalent PF, 6mos and older(Flulaval,Afluria,Fluarix,Fluzone)   CBC with Differential/Platelet   CMP14+EGFR   Lipid panel   Bayer DCA Hb A1c Waived   PSA, total and free   Microalbumin / creatinine urine ratio    Alex Levins, MD [facility] 04/20/2024, 11:45 AM

## 2024-04-20 NOTE — Telephone Encounter (Signed)
 Copied from CRM [id number]. Topic: Clinical - Medication Question >> Apr 20, 2024  1:27 PM Larissa S wrote: Reason for CRM: Patient states his medication to stop nicotine  has not been sent to pharmacy. Patient does not know name of medication.

## 2024-04-20 NOTE — Telephone Encounter (Signed)
 Copied from CRM [id number]. Topic: Clinical - Medication Question >> Apr 20, 2024  1:30 PM [name] ORN wrote: Reason for CRM: wanted to speak to speak to the office about his nicotine .

## 2024-04-20 NOTE — Telephone Encounter (Signed)
Patient aware it has been sent in 

## 2024-04-21 LAB — CBC WITH DIFFERENTIAL/PLATELET
Basophils Absolute: 0 x10E3/uL (ref 0.0–0.2)
Basos: 0 %
EOS (ABSOLUTE): 0.1 x10E3/uL (ref 0.0–0.4)
Eos: 2 %
Hematocrit: 39.3 % (ref 37.5–51.0)
Hemoglobin: 13.1 g/dL (ref 13.0–17.7)
Immature Grans (Abs): 0 x10E3/uL (ref 0.0–0.1)
Immature Granulocytes: 0 %
Lymphocytes Absolute: 3 x10E3/uL (ref 0.7–3.1)
Lymphs: 37 %
MCH: 29.7 pg (ref 26.6–33.0)
MCHC: 33.3 g/dL (ref 31.5–35.7)
MCV: 89 fL (ref 79–97)
Monocytes Absolute: 0.5 x10E3/uL (ref 0.1–0.9)
Monocytes: 6 %
Neutrophils Absolute: 4.5 x10E3/uL (ref 1.4–7.0)
Neutrophils: 55 %
Platelets: 177 x10E3/uL (ref 150–450)
RBC: 4.41 x10E6/uL (ref 4.14–5.80)
RDW: 13.7 % (ref 11.6–15.4)
WBC: 8.2 x10E3/uL (ref 3.4–10.8)

## 2024-04-21 LAB — CMP14+EGFR
ALT: 35 IU/L (ref 0–44)
AST: 21 IU/L (ref 0–40)
Albumin: 4.3 g/dL (ref 3.9–4.9)
Alkaline Phosphatase: 66 IU/L (ref 47–123)
BUN/Creatinine Ratio: 15 (ref 10–24)
BUN: 12 mg/dL (ref 8–27)
Bilirubin Total: 0.4 mg/dL (ref 0.0–1.2)
CO2: 23 mmol/L (ref 20–29)
Calcium: 9.3 mg/dL (ref 8.6–10.2)
Chloride: 106 mmol/L (ref 96–106)
Creatinine, Ser: 0.78 mg/dL (ref 0.76–1.27)
Globulin, Total: 1.8 g/dL (ref 1.5–4.5)
Glucose: 109 mg/dL — ABNORMAL HIGH (ref 70–99)
Potassium: 4 mmol/L (ref 3.5–5.2)
Sodium: 143 mmol/L (ref 134–144)
Total Protein: 6.1 g/dL (ref 6.0–8.5)
eGFR: 100 mL/min/1.73 (ref >59)

## 2024-04-21 LAB — LIPID PANEL
Chol/HDL Ratio: 2.5 ratio (ref 0.0–5.0)
Cholesterol, Total: 119 mg/dL (ref 100–199)
HDL: 48 mg/dL (ref >39)
LDL Chol Calc (NIH): 48 mg/dL (ref 0–99)
Triglycerides: 130 mg/dL (ref 0–149)
VLDL Cholesterol Cal: 23 mg/dL (ref 5–40)

## 2024-04-21 LAB — PSA, TOTAL AND FREE
PSA, Free Pct: 21.7 %
PSA, Free: 0.39 ng/mL
Prostate Specific Ag, Serum: 1.8 ng/mL (ref 0.0–4.0)

## 2024-04-27 ENCOUNTER — Ambulatory Visit: Payer: Self-pay | Admitting: Family Medicine

## 2024-05-05 DIAGNOSIS — S39012A Strain of muscle, fascia and tendon of lower back, initial encounter: Secondary | ICD-10-CM | POA: Diagnosis not present

## 2024-05-05 DIAGNOSIS — M25561 Pain in right knee: Secondary | ICD-10-CM | POA: Diagnosis not present

## 2024-05-05 DIAGNOSIS — M62830 Muscle spasm of back: Secondary | ICD-10-CM | POA: Diagnosis not present

## 2024-05-06 ENCOUNTER — Other Ambulatory Visit (INDEPENDENT_AMBULATORY_CARE_PROVIDER_SITE_OTHER)

## 2024-05-06 ENCOUNTER — Telehealth: Payer: Self-pay | Admitting: Pharmacist

## 2024-05-06 DIAGNOSIS — E669 Obesity, unspecified: Secondary | ICD-10-CM

## 2024-05-06 DIAGNOSIS — Z7985 Long-term (current) use of injectable non-insulin antidiabetic drugs: Secondary | ICD-10-CM

## 2024-05-06 DIAGNOSIS — E119 Type 2 diabetes mellitus without complications: Secondary | ICD-10-CM

## 2024-05-06 MED ORDER — SEMAGLUTIDE (2 MG/DOSE) 8 MG/3ML ~~LOC~~ SOPN: 2.0000 mg | PEN_INJECTOR | SUBCUTANEOUS | 4 refills | Status: AC

## 2024-05-06 NOTE — Progress Notes (Signed)
 05/06/2024 Name: Alex [name] MRN: [id number] DOB: 17-Jul-1960  Chief Complaint  Patient presents with   Diabetes    Montez Cuda is a 63 y.o. year old male who presented for a telephone visit.  I connected with  Tanda Jewell on 05/06/24 by telephone and verified that I am speaking with the correct person using two identifiers.  I discussed the limitations of evaluation and management by telemedicine. The patient expressed understanding and agreed to proceed.  Patient was located in her home and PharmD in PCP office during this visit.    They were referred to the pharmacist by their PCP for assistance in managing diabetes.    Subjective:  A1c remains at goal, however patient assistance for Ozempic  will end in 06/2024.  Patient has Ozempic  supply to last through the year.  His Farxiga  is reported to be >$700/90 day supply.  Care Team: Primary Care Provider: Dettinger, Fonda LABOR, MD    Medication Access/Adherence  Current Pharmacy:  [facility] [place], [place] - [address] [place] [address] Phone: [phone] Fax: [phone]  [id number] - [place], [place] - [address] [place] [address] Phone: [phone] Fax: [phone]   Patient reports affordability concerns with their medications: Yes  Patient reports access/transportation concerns to their pharmacy: No  Patient reports adherence concerns with their medications:  No     Diabetes:  Current medications: Ozempic , Farxiga  (unable to continue due to cost) Medications tried in the past: metformin  (was d/c'd due to to control on Ozempic , was having abdomnial/GI issues at the time)  Current glucose readings: FB<130  Patient denies hypoglycemic s/sx including dizziness, shakiness, sweating. Patient denies hyperglycemic symptoms including polyuria, polydipsia, polyphagia, nocturia, neuropathy, blurred vision.  Current meal  patterns:  The patient is asked to make an attempt to improve diet and exercise patterns to aid in medical management of this problem. Discussed meal planning options and Plate method for healthy eating Avoid sugary drinks and desserts Incorporate balanced protein, non starchy veggies, 1 serving of carbohydrate with each meal Increase water intake Increase physical activity as able  Current physical activity: limited due to upcoming ortho surgery  Current medication access support: novo nordisk PAP (Ozempic )--will end 2025  Macrovascular and Microvascular Risk Reduction:  Statin? yes (rosuva); ACEi/ARB? yes (lisinopril ) Last urinary albumin/creatinine ratio:  Lab Results  Component Value Date   MICRALBCREAT <19 06/20/2022   MICRALBCREAT <7 11/26/2019   MICRALBCREAT 5.8 09/25/2017   Last eye exam:  Lab Results  Component Value Date   HMDIABEYEEXA No Retinopathy 04/19/2022   Last foot exam: No foot exam found Tobacco Use:  Tobacco Use: High Risk (04/20/2024)   Patient History    Smoking Tobacco Use: Never    Smokeless Tobacco Use: Current    Passive Exposure: Not on file     Objective:  Lab Results  Component Value Date   HGBA1C 5.4 04/20/2024    Lab Results  Component Value Date   CREATININE 0.78 04/20/2024   BUN 12 04/20/2024   NA 143 04/20/2024   K 4.0 04/20/2024   CL 106 04/20/2024   CO2 23 04/20/2024    Lab Results  Component Value Date   CHOL 119 04/20/2024   HDL 48 04/20/2024   LDLCALC 48 04/20/2024   TRIG 130 04/20/2024   CHOLHDL 2.5 04/20/2024    Medications Reviewed Today     Reviewed by Billee Mliss BIRCH, [facility] (Pharmacist)  on 05/06/24 at 1013  Med List Status: <None>   Medication Order Taking? Sig Documenting Provider Last Dose Status Informant  amLODipine  (NORVASC ) 5 MG tablet [id number]  Take 1 tablet (5 mg total) by mouth daily. Dettinger, Fonda LABOR, MD  Active   buPROPion (WELLBUTRIN XL) 150 MG 24 hr tablet [id number]  Take 1 tablet (150 mg  total) by mouth daily. Dettinger, Fonda LABOR, MD  Active   calcium -vitamin D (OSCAL WITH D) 500-5 MG-MCG tablet [id number]  Take 1 tablet by mouth daily with breakfast. Dettinger, Fonda LABOR, MD  Active   diazepam  (VALIUM ) 5 MG tablet [id number]  Take 1 tablet (5 mg total) by mouth daily as needed for anxiety.  Patient not taking: Reported on 04/20/2024   Vernetta Lonni GRADE, MD  Active   diclofenac  (VOLTAREN ) 75 MG EC tablet [id number]  Take 1 tablet (75 mg total) by mouth 2 (two) times daily. Dettinger, Fonda LABOR, MD  Active   diclofenac  Sodium (VOLTAREN ) 1 % GEL [id number]  Apply 2 g topically 4 (four) times daily. Dettinger, Fonda LABOR, MD  Active   dicyclomine  (BENTYL ) 10 MG capsule [id number]  Take 1 capsule (10 mg total) by mouth 4 (four) times daily -  before meals and at bedtime. Dettinger, Fonda LABOR, MD  Active   FARXIGA  10 MG TABS tablet [id number]  Take 1 tablet (10 mg total) by mouth daily. Dettinger, Fonda LABOR, MD  Active   fluticasone  (FLONASE ) 50 MCG/ACT nasal spray [id number]  USE 1 SPRAY IN EACH NOSTRIL 2 TIMES A DAY AS NEEDED FOR ALLERGIES Dettinger, Fonda LABOR, MD  Active   HYDROcodone -acetaminophen  (NORCO) 10-325 MG tablet [id number]  Take 1 tablet by mouth daily. Take 4 tablets per day for one week   #28 Take 3 tablets per day for one week   #21 Take 2 tablets per day for one week   #14 Take 1 tablet per day for one week      #7  then stop Total #70 tablets Urbano Albright, MD  Active   ketoconazole  (NIZORAL ) 2 % shampoo [id number]  SHAMPOO AS DIRECTED 2 TIMES A WEEK, LEAVING ON FOR 10 MINUTES THEN RINSING Dettinger, Fonda LABOR, MD  Active   lisinopril  (ZESTRIL ) 40 MG tablet [id number]  Take 1 tablet (40 mg total) by mouth daily. Dettinger, Fonda LABOR, MD  Active   nabumetone  (RELAFEN ) 750 MG tablet [id number]  Take 1 tablet (750 mg total) by mouth 2 (two) times daily as needed.  Patient not taking: Reported on 04/20/2024   Vernetta Lonni GRADE, MD  Active   pantoprazole  (PROTONIX ) 40 MG tablet  [id number]  Take 1 tablet (40 mg total) by mouth 2 (two) times daily before a meal. Dettinger, Fonda LABOR, MD  Active   rosuvastatin  (CRESTOR ) 10 MG tablet [id number]  Take 1 tablet (10 mg total) by mouth daily. Dettinger, Fonda LABOR, MD  Active   Semaglutide , 2 MG/DOSE, 8 MG/3ML SOPN [id number]  Inject 2 mg as directed once a week. DX: E11.9; Please profile, pt needs in January Dettinger, Fonda LABOR, MD  Active   venlafaxine  XR (EFFEXOR  XR) 37.5 MG 24 hr capsule [id number]  Take 1 capsule (37.5 mg total) by mouth daily.  Patient not taking: Reported on 04/20/2024   Urbano Albright, MD  Expired 05/10/23 2359               Assessment/Plan:   Diabetes: - Currently controlled; goal A1c <7%. Cardiorenal risk reduction is optimized.. Blood pressure is at goal <130/80. LDL  is at goal.  - Reviewed long term cardiovascular and renal outcomes of uncontrolled blood sugar. and Reviewed goal A1c, goal fasting, and goal 2 hour post prandial glucose. Recommended to check glucose fasting or if symptomatic - Recommend to continue Ozempic , restart Farxiga . Patient needs AZ&me PAP due to copay $700/90 days. Routed to [facility]Ph[facility] to assist with application - Patient denies personal or family history of multiple endocrine neoplasia type 2, medullary thyroid  cancer; personal history of pancreatitis or gallbladder disease., Discussed side effects of gastrointestinal upset/nausea; eating smaller meals, avoiding high-fat foods, and remaining upright after eating may reduce nausea. Discussed that overeating is a major trigger of nausea with this class of medications, as often times patients will start to feel full sooner and may need to decrease portion sizes from what they were previously accustomed to. , Discussed potential side effects of dehydration, genitourinary infections., Advised on sick day rules (if a day with significantly reduced oral intake, serious vomiting, or diarrhea, hold SGLT2), Encouraged adequate hydration and  genital hygiene.   Follow Up Plan: 3-4 weeks PharmD  Machaela Caterino Dattero Margues Filippini, PharmD, BCACP, CPP Clinical Pharmacist, [facility]

## 2024-05-06 NOTE — Telephone Encounter (Signed)
 Please enroll farxiga  AZ&me $700 at pharmacy for 90ds--didn't pick up Okay to route PAP to me Aware the novo nordisk PAP is dropping ozempic --RX sent to pharmacy

## 2024-05-07 ENCOUNTER — Telehealth: Payer: Self-pay

## 2024-05-07 NOTE — Telephone Encounter (Signed)
 PAP: Application for Marcelline Deist has been submitted to [employer] (AZ&Me), via fax

## 2024-05-07 NOTE — Telephone Encounter (Signed)
 New PAP  AZ&ME application in process of being completed by Mliss SQUIBB.

## 2024-05-12 DIAGNOSIS — Z96651 Presence of right artificial knee joint: Secondary | ICD-10-CM | POA: Diagnosis not present

## 2024-05-12 DIAGNOSIS — Z720 Tobacco use: Secondary | ICD-10-CM | POA: Diagnosis not present

## 2024-05-12 DIAGNOSIS — M25561 Pain in right knee: Secondary | ICD-10-CM | POA: Diagnosis not present

## 2024-05-12 DIAGNOSIS — T84032D Mechanical loosening of internal right knee prosthetic joint, subsequent encounter: Secondary | ICD-10-CM | POA: Diagnosis not present

## 2024-05-15 DIAGNOSIS — M5416 Radiculopathy, lumbar region: Secondary | ICD-10-CM | POA: Diagnosis not present

## 2024-05-19 DIAGNOSIS — Z96651 Presence of right artificial knee joint: Secondary | ICD-10-CM | POA: Diagnosis not present

## 2024-05-19 DIAGNOSIS — F17210 Nicotine dependence, cigarettes, uncomplicated: Secondary | ICD-10-CM | POA: Diagnosis not present

## 2024-05-21 ENCOUNTER — Ambulatory Visit: Payer: Self-pay | Admitting: Family Medicine

## 2024-05-22 ENCOUNTER — Other Ambulatory Visit: Payer: Self-pay | Admitting: *Deleted

## 2024-05-22 DIAGNOSIS — L219 Seborrheic dermatitis, unspecified: Secondary | ICD-10-CM

## 2024-05-25 ENCOUNTER — Ambulatory Visit: Admitting: Family Medicine

## 2024-05-28 NOTE — Telephone Encounter (Signed)
 Mailed letter to explain

## 2024-06-01 ENCOUNTER — Ambulatory Visit: Admitting: Orthopaedic Surgery

## 2024-06-05 DIAGNOSIS — Z79891 Long term (current) use of opiate analgesic: Secondary | ICD-10-CM | POA: Diagnosis not present

## 2024-06-05 DIAGNOSIS — M25561 Pain in right knee: Secondary | ICD-10-CM | POA: Diagnosis not present

## 2024-06-05 DIAGNOSIS — Z96651 Presence of right artificial knee joint: Secondary | ICD-10-CM | POA: Diagnosis not present

## 2024-06-05 DIAGNOSIS — G8929 Other chronic pain: Secondary | ICD-10-CM | POA: Diagnosis not present

## 2024-06-06 DIAGNOSIS — E119 Type 2 diabetes mellitus without complications: Secondary | ICD-10-CM | POA: Diagnosis not present

## 2024-06-06 DIAGNOSIS — Z9889 Other specified postprocedural states: Secondary | ICD-10-CM | POA: Diagnosis not present

## 2024-06-06 DIAGNOSIS — M25561 Pain in right knee: Secondary | ICD-10-CM | POA: Diagnosis not present

## 2024-06-06 DIAGNOSIS — Z8739 Personal history of other diseases of the musculoskeletal system and connective tissue: Secondary | ICD-10-CM | POA: Diagnosis not present

## 2024-06-06 DIAGNOSIS — G8929 Other chronic pain: Secondary | ICD-10-CM | POA: Diagnosis not present

## 2024-06-29 ENCOUNTER — Ambulatory Visit: Admitting: Orthopaedic Surgery

## 2024-07-03 NOTE — Telephone Encounter (Signed)
 Called patient to schedule follow up appointment with [facility] from November recall list. Phone went to voicemail. Mail box is not set up so unable to leave message. Will attempt to call patient again.    Nelson Cristal, [place] 07/03/2024 4:07 PM General Patient Liaison, [facility]

## 2024-07-09 ENCOUNTER — Encounter (HOSPITAL_COMMUNITY): Payer: Self-pay | Admitting: Emergency Medicine

## 2024-07-09 ENCOUNTER — Other Ambulatory Visit: Payer: Self-pay

## 2024-07-09 ENCOUNTER — Emergency Department (HOSPITAL_COMMUNITY)

## 2024-07-09 ENCOUNTER — Emergency Department (HOSPITAL_COMMUNITY)
Admission: EM | Admit: 2024-07-09 | Discharge: 2024-07-09 | Disposition: A | Discharge status: Home / Self Care | Attending: Emergency Medicine | Admitting: Emergency Medicine

## 2024-07-09 DIAGNOSIS — I10 Essential (primary) hypertension: Secondary | ICD-10-CM | POA: Diagnosis not present

## 2024-07-09 DIAGNOSIS — N5082 Scrotal pain: Secondary | ICD-10-CM | POA: Diagnosis present

## 2024-07-09 DIAGNOSIS — E119 Type 2 diabetes mellitus without complications: Secondary | ICD-10-CM | POA: Insufficient documentation

## 2024-07-09 DIAGNOSIS — I861 Scrotal varices: Secondary | ICD-10-CM | POA: Insufficient documentation

## 2024-07-09 LAB — URINALYSIS, ROUTINE W REFLEX MICROSCOPIC
Bilirubin Urine: NEGATIVE
Glucose, UA: NEGATIVE mg/dL
Hgb urine dipstick: NEGATIVE
Ketones, ur: NEGATIVE mg/dL
Leukocytes,Ua: NEGATIVE
Nitrite: NEGATIVE
Protein, ur: NEGATIVE mg/dL
Specific Gravity, Urine: 1.009 (ref 1.005–1.030)
pH: 6 (ref 5.0–8.0)

## 2024-07-09 MED ORDER — LEVOFLOXACIN 500 MG PO TABS: 500.0000 mg | ORAL_TABLET | Freq: Every day | ORAL | 0 refills | Status: AC

## 2024-07-09 MED ORDER — IBUPROFEN 400 MG PO TABS
400.0000 mg | ORAL_TABLET | Freq: Once | ORAL | Status: AC
  Administered 2024-07-09: 400 mg via ORAL
  Filled 2024-07-09: qty 1

## 2024-07-09 MED ORDER — LEVOFLOXACIN IN D5W 500 MG/100ML IV SOLN
500.0000 mg | Freq: Once | INTRAVENOUS | Status: AC
  Administered 2024-07-09: 500 mg via INTRAVENOUS
  Filled 2024-07-09: qty 100

## 2024-07-09 MED ORDER — HYDROCODONE-ACETAMINOPHEN 5-325 MG PO TABS
2.0000 | ORAL_TABLET | Freq: Once | ORAL | Status: AC
  Administered 2024-07-09: 2 via ORAL
  Filled 2024-07-09: qty 2

## 2024-07-09 NOTE — ED Provider Notes (Signed)
 " [place] [facility] AT [facility] Provider Note   CSN: [id number] Arrival date & time: 07/09/24  0030     Patient presents with: Testicle Pain and Groin Swelling   Alex [name] Vital is a 64 y.o. male.   The history is provided by the patient.   Patient history of diabetes, hypertension, chronic pain presents with testicle pain.  Patient reports he has had the symptoms for 3 weeks.  Pain is occurring on a daily basis  no trauma.  No fevers or vomiting.  No dysuria No swelling He reports similar episode several years ago but is unclear what caused the symptoms.  No previous testicle surgery   Past Medical History:  Diagnosis Date   Diabetes mellitus without complication (HCC)    History of kidney stones    removed 5 months ago   Hypertension    PONV (postoperative nausea and vomiting)    AGITATION after anesthesia as well   Sleep apnea    NO CPAP    Prior to Admission medications  Medication Sig Start Date End Date Taking? Authorizing Provider  levofloxacin (LEVAQUIN) 500 MG tablet Take 1 tablet (500 mg total) by mouth daily. 07/09/24  Yes Midge Golas, MD  amLODipine  (NORVASC ) 5 MG tablet Take 1 tablet (5 mg total) by mouth daily. 10/15/23   Dettinger, Fonda LABOR, MD  buPROPion  (WELLBUTRIN  XL) 150 MG 24 hr tablet Take 1 tablet (150 mg total) by mouth daily. 04/20/24   Dettinger, Fonda LABOR, MD  calcium -vitamin D (OSCAL WITH D) 500-5 MG-MCG tablet Take 1 tablet by mouth daily with breakfast. 04/20/24   Dettinger, Fonda LABOR, MD  diazepam  (VALIUM ) 5 MG tablet Take 1 tablet (5 mg total) by mouth daily as needed for anxiety. Patient not taking: Reported on 04/20/2024 09/06/21   Vernetta Lonni GRADE, MD  diclofenac  (VOLTAREN ) 75 MG EC tablet Take 1 tablet (75 mg total) by mouth 2 (two) times daily. 04/20/24   Dettinger, Fonda LABOR, MD  diclofenac  Sodium (VOLTAREN ) 1 % GEL Apply 2 g topically 4 (four) times daily. 04/20/24   Dettinger, Fonda LABOR, MD  dicyclomine  (BENTYL )  10 MG capsule Take 1 capsule (10 mg total) by mouth 4 (four) times daily -  before meals and at bedtime. 04/20/24   Dettinger, Fonda LABOR, MD  FARXIGA  10 MG TABS tablet Take 1 tablet (10 mg total) by mouth daily. 04/20/24   Dettinger, Fonda LABOR, MD  fluticasone  (FLONASE ) 50 MCG/ACT nasal spray USE 1 SPRAY IN EACH NOSTRIL 2 TIMES A DAY AS NEEDED FOR ALLERGIES 10/09/23   Dettinger, Fonda LABOR, MD  HYDROcodone -acetaminophen  (NORCO) 10-325 MG tablet Take 1 tablet by mouth daily. Take 4 tablets per day for one week   #28 Take 3 tablets per day for one week   #21 Take 2 tablets per day for one week   #14 Take 1 tablet per day for one week      #7  then stop Total #70 tablets 06/18/22   Urbano Albright, MD  ketoconazole  (NIZORAL ) 2 % shampoo SHAMPOO AS DIRECTED 2 TIMES A WEEK, LEAVING ON FOR 10 MINUTES THEN RINSING 05/24/24   Hawks, Christy A, FNP  lisinopril  (ZESTRIL ) 40 MG tablet Take 1 tablet (40 mg total) by mouth daily. 04/20/24   Dettinger, Fonda LABOR, MD  nabumetone  (RELAFEN ) 750 MG tablet Take 1 tablet (750 mg total) by mouth 2 (two) times daily as needed. Patient not taking: Reported on 04/20/2024 10/26/20   Vernetta Lonni GRADE, MD  pantoprazole  (  PROTONIX ) 40 MG tablet Take 1 tablet (40 mg total) by mouth 2 (two) times daily before a meal. 04/20/24   Dettinger, Fonda LABOR, MD  rosuvastatin  (CRESTOR ) 10 MG tablet Take 1 tablet (10 mg total) by mouth daily. 04/20/24   Dettinger, Fonda LABOR, MD  Semaglutide , 2 MG/DOSE, 8 MG/3ML SOPN Inject 2 mg as directed once a week. DX: E11.9; Please profile, pt needs in January 05/06/24   Dettinger, Fonda LABOR, MD  venlafaxine  XR (EFFEXOR  XR) 37.5 MG 24 hr capsule Take 1 capsule (37.5 mg total) by mouth daily. Patient not taking: Reported on 04/20/2024 05/10/22 05/10/23  Urbano Albright, MD    Allergies: Morphine  and codeine, Buprenorphine hcl-naloxone hcl, and Codeine    Review of Systems  Constitutional:  Negative for fever.  Gastrointestinal:  Negative for abdominal  pain and vomiting.  Genitourinary:  Positive for testicular pain. Negative for dysuria.    Updated Vital Signs BP 122/69   Pulse 61   Temp 97.8 F (36.6 C) (Oral)   Resp 16   Ht 1.676 m (5' 6)   Wt 94.3 kg   SpO2 96%   BMI 33.57 kg/m   Physical Exam CONSTITUTIONAL: Well developed/well nourished no distress HEAD: Normocephalic/atraumatic ABDOMEN: soft, nontender, no rebound or guarding, bowel sounds noted throughout abdomen GU:no cva tenderness Mild tenderness noted to the right testicle.  There is no overlying erythema or swelling.  No hernia.  Right testicle is retracted compared to left No crepitance. No hernia No inguinal lymphadenopathy. No tenderness to the left testicle Nurse present for exam NEURO: Pt is awake/alert/appropriate, moves all extremitiesx4.  No facial droop.   EXTREMITIES: pulses normal/equal, full ROM, distal pulses intact in both legs SKIN: warm, color normal PSYCH: no abnormalities of mood noted, alert and oriented to situation  (all labs ordered are listed, but only abnormal results are displayed) Labs Reviewed  URINALYSIS, ROUTINE W REFLEX MICROSCOPIC - Abnormal; Notable for the following components:      Result Value   Color, Urine STRAW (*)    All other components within normal limits  GC/CHLAMYDIA PROBE AMP ([place]) NOT AT [facility]    EKG: None  Radiology: US  SCROTUM W/DOPPLER Result Date: 07/09/2024 EXAM: ULTRASOUND SCROTUM/TESTICLES WITH DOPPLER FLOW EVALUATION 07/09/2024 03:11:27 AM TECHNIQUE: Duplex ultrasound using B-mode/gray scaled imaging, spectral Doppler waveform analysis and color flow Doppler was obtained of the testicles. COMPARISON: None available. CLINICAL HISTORY: Testicle pain. FINDINGS: RIGHT: GREY SCALE: The right testicle measures 5.4 x 2.6 x 3.5 cm with a calculated volume of 25.6 cm. It demonstrates normal homogeneous echotexture without focal lesion. No testicular microlithiasis. DOPPLER EVALUATION: There is normal  vascularity on color Doppler examination. Spectral Doppler arterial and venous waveforms are normal. VARICOCELE: Small right varicocele. An isolated right varicocele is unusual and suggests a more central obstruction. Dedicated non-emergent CT imaging of the abdomen and pelvis is recommended for further evaluation. SCROTAL SAC: Small right hydrocele. EPIDIDYMIS: No acute abnormality. LEFT: GREY SCALE: The left testicle measures 4.7 x 2.3 x 3.1 cm with a calculated volume of 17.5 cm. Small area of shadowing hypoechogenicity within the testicle may represent a small focus of fibrotic change related to a burnt out lesion or remote trauma or inflammation. No intratesticular mass is identified. No testicular microlithiasis. DOPPLER EVALUATION: There is normal vascularity on color Doppler examination. Spectral Doppler arterial and venous waveforms are normal. VARICOCELE: No scrotal varicocele. SCROTAL SAC: Small left hydrocele. EPIDIDYMIS: No acute abnormality. IMPRESSION: 1. Normal vascularity of the testes bilaterally. 2.  Small area of shadowing hypoechogenicity within the left testicle, possibly representing fibrotic change related to a burnt out lesion or remote trauma/inflammation, with no intratesticular mass identified. 3. Small right varicocele . Isolated right varicocele unusual and suggesting a more central obstruction; dedicated non-emergent CT imaging of the abdomen and pelvis is recommended for further evaluation. 4. Small bilateral hydroceles Electronically signed by: Dorethia Molt MD 07/09/2024 03:40 AM EST RP Workstation: [id number]     Procedures   Medications Ordered in the ED  levofloxacin (LEVAQUIN) IVPB 500 mg (has no administration in time range)  HYDROcodone -acetaminophen  (NORCO/VICODIN) 5-325 MG per tablet 2 tablet (2 tablets Oral Given 07/09/24 0211)  ibuprofen (ADVIL) tablet 400 mg (400 mg Oral Given 07/09/24 0348)    Clinical Course as of 07/09/24 0406  Thu Jul 09, 2024  0151 Patient  presents with groin pain for 3 weeks that is now worsening.  on repeat assessment his pain is dramatically worsened.  Will obtain emergent ultrasound to rule out torsion [DW]  0405 Ultrasound is negative for torsion.  No signs of orchitis However there are varicoceles, and will likely need urology follow-up with nonemergent CT imaging Patient overall stable though is still having pain Due to persistent pain, we will proceed with antibiotic treatment with Levaquin in case there is any early infection He was instructed to follow-up with urology and advised he may need further imaging [DW]    Clinical Course User Index [DW] Midge Golas, MD                                 Medical Decision Making Amount and/or Complexity of Data Reviewed Labs: ordered. Radiology: ordered.  Risk Prescription drug management.   This patient presents to the ED for concern of scrotal pain, this involves an extensive number of treatment options, and is a complaint that carries with it a high risk of complications and morbidity.  The differential diagnosis includes but is not limited to testicular torsion, orchitis, epididymitis, Fournier's gangrene, prostatitis, urethritis, hernia  Comorbidities that complicate the patient evaluation: Patients presentation is complicated by their history of chronic pain, hypertension  Social Determinants of Health: Patients chronic pain and poor health literacy  increases the complexity of managing their presentation  Additional history obtained: Records reviewed outpatient records reviewed  Lab Tests: I Ordered, and personally interpreted labs.  The pertinent results include: Urinalysis negative  Imaging Studies ordered: I ordered imaging studies including Ultrasound scrotal  I independently visualized and interpreted imaging which showed negative for torsion I agree with the radiologist interpretation   Medicines ordered and prescription drug management: I  ordered medication including Vicodin for pain Reevaluation of the patient after these medicines showed that the patient    improved  Reevaluation: After the interventions noted above, I reevaluated the patient and found that they have :improved  Complexity of problems addressed: Patients presentation is most consistent with  acute presentation with potential threat to life or bodily function  Disposition: After consideration of the diagnostic results and the patients response to treatment,  I feel that the patent would benefit from discharge  .        Final diagnoses:  Scrotal pain  Varicocele    ED Discharge Orders          Ordered    levofloxacin (LEVAQUIN) 500 MG tablet  Daily        07/09/24 0405  Midge Golas, MD 07/09/24 0408  "

## 2024-07-09 NOTE — ED Triage Notes (Signed)
 Pt to ED from home c/o right testicle swelling and pain for 2 days.  States had similar pain 2 years ago.  Denies urinary changes and difficulty.

## 2024-07-09 NOTE — Discharge Instructions (Signed)
 Please start the antibiotic on Friday This might be related to an enlarged prostate or an early infection You can continue your pain medicine as prescribed Please call the urology doctor in [place] for follow-up within the next 7 to 10 days You will likely need further testing and may need a CAT scan of your abdomen and kidneys

## 2024-07-09 NOTE — ED Notes (Signed)
Pt calling out for pain medications. MD notified.

## 2024-07-13 LAB — GC/CHLAMYDIA PROBE AMP (~~LOC~~) NOT AT ARMC
Chlamydia: NEGATIVE
Comment: NEGATIVE
Comment: NORMAL
Neisseria Gonorrhea: NEGATIVE

## 2024-10-19 ENCOUNTER — Ambulatory Visit: Payer: Self-pay | Admitting: Family Medicine
